# Patient Record
Sex: Female | Born: 1940 | Race: White | Hispanic: No | Marital: Married | Smoking: Never smoker
Health system: Southern US, Community
[De-identification: ages and names within clinical notes are randomized; demographics above are authoritative.]

## PROBLEM LIST (undated history)

## (undated) DIAGNOSIS — I10 Essential (primary) hypertension: Secondary | ICD-10-CM

## (undated) DIAGNOSIS — M1990 Unspecified osteoarthritis, unspecified site: Secondary | ICD-10-CM

## (undated) DIAGNOSIS — Z87442 Personal history of urinary calculi: Secondary | ICD-10-CM

## (undated) DIAGNOSIS — R319 Hematuria, unspecified: Secondary | ICD-10-CM

## (undated) DIAGNOSIS — I219 Acute myocardial infarction, unspecified: Secondary | ICD-10-CM

## (undated) DIAGNOSIS — R600 Localized edema: Secondary | ICD-10-CM

## (undated) DIAGNOSIS — E119 Type 2 diabetes mellitus without complications: Secondary | ICD-10-CM

## (undated) DIAGNOSIS — I4891 Unspecified atrial fibrillation: Secondary | ICD-10-CM

## (undated) DIAGNOSIS — D649 Anemia, unspecified: Secondary | ICD-10-CM

## (undated) DIAGNOSIS — Z8639 Personal history of other endocrine, nutritional and metabolic disease: Secondary | ICD-10-CM

## (undated) DIAGNOSIS — J189 Pneumonia, unspecified organism: Secondary | ICD-10-CM

## (undated) DIAGNOSIS — C801 Malignant (primary) neoplasm, unspecified: Secondary | ICD-10-CM

## (undated) DIAGNOSIS — G5601 Carpal tunnel syndrome, right upper limb: Secondary | ICD-10-CM

## (undated) DIAGNOSIS — K219 Gastro-esophageal reflux disease without esophagitis: Secondary | ICD-10-CM

## (undated) DIAGNOSIS — N189 Chronic kidney disease, unspecified: Secondary | ICD-10-CM

## (undated) DIAGNOSIS — R0600 Dyspnea, unspecified: Secondary | ICD-10-CM

## (undated) DIAGNOSIS — N2889 Other specified disorders of kidney and ureter: Secondary | ICD-10-CM

## (undated) HISTORY — DX: Anemia, unspecified: D64.9

## (undated) HISTORY — DX: Unspecified osteoarthritis, unspecified site: M19.90

## (undated) HISTORY — PX: ABDOMINAL HYSTERECTOMY: SHX81

## (undated) HISTORY — PX: TONSILLECTOMY: SUR1361

## (undated) HISTORY — DX: Essential (primary) hypertension: I10

## (undated) HISTORY — PX: CARPAL TUNNEL RELEASE: SHX101

## (undated) HISTORY — PX: HAMMER TOE SURGERY: SHX385

## (undated) HISTORY — PX: CHOLECYSTECTOMY: SHX55

## (undated) HISTORY — DX: Type 2 diabetes mellitus without complications: E11.9

## (undated) HISTORY — PX: AORTIC VALVE SURGERY: SHX549

## (undated) HISTORY — PX: BILATERAL CARPAL TUNNEL RELEASE: SHX6508

---

## 1990-12-18 HISTORY — PX: ABDOMINAL HYSTERECTOMY: SHX81

## 1994-12-18 HISTORY — PX: CHOLECYSTECTOMY: SHX55

## 1998-06-01 ENCOUNTER — Ambulatory Visit (HOSPITAL_COMMUNITY): Admission: RE | Admit: 1998-06-01 | Discharge: 1998-06-01 | Payer: Self-pay | Admitting: Family Medicine

## 2003-02-16 ENCOUNTER — Encounter: Payer: Self-pay | Admitting: Family Medicine

## 2003-02-16 ENCOUNTER — Encounter: Admission: RE | Admit: 2003-02-16 | Discharge: 2003-02-16 | Payer: Self-pay | Admitting: Family Medicine

## 2012-01-11 ENCOUNTER — Ambulatory Visit (INDEPENDENT_AMBULATORY_CARE_PROVIDER_SITE_OTHER): Payer: Medicare Other

## 2012-01-11 DIAGNOSIS — E669 Obesity, unspecified: Secondary | ICD-10-CM

## 2012-01-11 DIAGNOSIS — I10 Essential (primary) hypertension: Secondary | ICD-10-CM

## 2012-01-11 DIAGNOSIS — E785 Hyperlipidemia, unspecified: Secondary | ICD-10-CM

## 2012-05-10 ENCOUNTER — Ambulatory Visit: Payer: Medicare Other

## 2012-05-10 ENCOUNTER — Ambulatory Visit (INDEPENDENT_AMBULATORY_CARE_PROVIDER_SITE_OTHER): Payer: Medicare Other | Admitting: Family Medicine

## 2012-05-10 VITALS — BP 140/82 | HR 79 | Temp 97.6°F | Resp 16 | Ht 66.75 in | Wt 256.0 lb

## 2012-05-10 DIAGNOSIS — B079 Viral wart, unspecified: Secondary | ICD-10-CM

## 2012-05-10 DIAGNOSIS — M659 Synovitis and tenosynovitis, unspecified: Secondary | ICD-10-CM

## 2012-05-10 DIAGNOSIS — M7750 Other enthesopathy of unspecified foot: Secondary | ICD-10-CM

## 2012-05-10 DIAGNOSIS — M25579 Pain in unspecified ankle and joints of unspecified foot: Secondary | ICD-10-CM

## 2012-05-10 DIAGNOSIS — M79609 Pain in unspecified limb: Secondary | ICD-10-CM

## 2012-05-10 DIAGNOSIS — I10 Essential (primary) hypertension: Secondary | ICD-10-CM

## 2012-05-10 MED ORDER — OXAPROZIN 600 MG PO TABS: ORAL_TABLET | ORAL | Status: AC

## 2012-05-10 MED ORDER — LOSARTAN POTASSIUM-HCTZ 100-25 MG PO TABS: 1.0000 | ORAL_TABLET | Freq: Every day | ORAL | Status: DC

## 2012-05-10 NOTE — Progress Notes (Signed)
Subjective: Since January the patient has been having problems with pain in the medial aspect of her right ankle. She knows of no specific injury. She does care for a couple of grandchildren who she has to run after. She is awake. She has then gradually started developing more pain in the lateral and posterior thigh of the same length. It hurt the worse actually hurt up in the knee  Area.  Has a skin lesion her finger she wants me to check   Followup on blood pressure. Takes medicine regularly. No problems.  Objective No edema. A dermatofibroma-like lesion is apparent above the left and right medial malleolus. She has tenderness anterior and below the malleolus. No tenderness of the calcaneus indicative of a plantar fasciitis. The ankle does hurt on the version. No no pain to palpation of the lower thigh and posterior lateral calf, but that's area that hurt. Chest clear. Heart regular.  Assessment ankle pain Secondary strain and pain of right lateral thigh and calf muscles. Hypertension Wart  Plan: X-ray Shaved off the top of the warty lesions, and indeed it looks like a wart. We'll freeze it.  [facility] reading (PRIMARY) by  Dr. Alwyn Ren Mild arthritic changes, no fx.Loura Back treatment Was treated with cryosurgery freestyle refreeze x2. Patient tolerated the procedure well  Refill bp med

## 2012-05-10 NOTE — Patient Instructions (Signed)
Warts Warts are a common viral infection. They are most commonly caused by the human papillomavirus (HPV). Warts can occur at all ages. However, they occur most frequently in older children and infrequently in the elderly. Warts may be single or multiple. Location and size varies. Warts can be spread by scratching the wart and then scratching normal skin. The life cycle of warts varies. However, most will disappear over many months to a couple years. Warts commonly do not cause problems (asymptomatic) unless they are over an area of pressure, such as the bottom of the foot. If they are large enough, they may cause pain with walking. DIAGNOSIS  Warts are most commonly diagnosed by their appearance. Tissue samples (biopsies) are not required unless the wart looks abnormal. Most warts have a rough surface, are round, oval, or irregular, and are skin-colored to light yellow, brown, or gray. They are generally less than  inch (1.3 cm), but they can be any size. TREATMENT   Observation or no treatment.   Freezing with liquid nitrogen.   High heat (cautery).   Boosting the body's immunity to fight off the wart (immunotherapy using Candida antigen).   Laser surgery.   Application of various irritants and solutions.  HOME CARE INSTRUCTIONS  Follow your caregiver's instructions. No special precautions are necessary. Often, treatment may be followed by a return (recurrence) of warts. Warts are generally difficult to treat and get rid of. If treatment is done in a clinic setting, usually more than 1 treatment is required. This is usually done on only a monthly basis until the wart is completely gone. SEEK IMMEDIATE MEDICAL CARE IF: The treated skin becomes red, puffy (swollen), or painful. Document Released: 09/13/2005 Document Revised: 11/23/2011 Document Reviewed: 03/11/2010 [facility] Patient Information 2012 [place], [place].   If foot continues to hurt after 2-3 weeks we can refer you to  orthopedics

## 2012-12-05 ENCOUNTER — Other Ambulatory Visit: Payer: Self-pay | Admitting: Family Medicine

## 2013-02-06 ENCOUNTER — Ambulatory Visit (INDEPENDENT_AMBULATORY_CARE_PROVIDER_SITE_OTHER): Payer: Medicare Other | Admitting: Family Medicine

## 2013-02-06 VITALS — BP 143/75 | HR 79 | Temp 97.8°F | Resp 16 | Ht 66.5 in | Wt 263.2 lb

## 2013-02-06 DIAGNOSIS — R7309 Other abnormal glucose: Secondary | ICD-10-CM

## 2013-02-06 DIAGNOSIS — R739 Hyperglycemia, unspecified: Secondary | ICD-10-CM

## 2013-02-06 DIAGNOSIS — I10 Essential (primary) hypertension: Secondary | ICD-10-CM

## 2013-02-06 LAB — LIPID PANEL
Cholesterol: 206 mg/dL — ABNORMAL HIGH (ref 0–200)
HDL: 56 mg/dL (ref >39)
LDL Cholesterol: 133 mg/dL — ABNORMAL HIGH (ref 0–99)
Total CHOL/HDL Ratio: 3.7 Ratio
Triglycerides: 86 mg/dL (ref <150)
VLDL: 17 mg/dL (ref 0–40)

## 2013-02-06 LAB — COMPREHENSIVE METABOLIC PANEL
ALT: 14 U/L (ref 0–35)
AST: 13 U/L (ref 0–37)
Albumin: 3.9 g/dL (ref 3.5–5.2)
Alkaline Phosphatase: 87 U/L (ref 39–117)
BUN: 19 mg/dL (ref 6–23)
CO2: 26 mEq/L (ref 19–32)
Calcium: 9.4 mg/dL (ref 8.4–10.5)
Chloride: 103 mEq/L (ref 96–112)
Creat: 0.77 mg/dL (ref 0.50–1.10)
Glucose, Bld: 177 mg/dL — ABNORMAL HIGH (ref 70–99)
Potassium: 4.3 mEq/L (ref 3.5–5.3)
Sodium: 141 mEq/L (ref 135–145)
Total Bilirubin: 0.5 mg/dL (ref 0.3–1.2)
Total Protein: 6.6 g/dL (ref 6.0–8.3)

## 2013-02-06 LAB — POCT GLYCOSYLATED HEMOGLOBIN (HGB A1C): Hemoglobin A1C: 6.4

## 2013-02-06 MED ORDER — LOSARTAN POTASSIUM-HCTZ 100-25 MG PO TABS: 1.0000 | ORAL_TABLET | Freq: Every day | ORAL | Status: DC

## 2013-02-06 NOTE — Patient Instructions (Addendum)
Continue same medications  Advise seeing your eye doctor very soon  Return in about June or July  Get more regular exercise

## 2013-02-06 NOTE — Progress Notes (Signed)
Subjective: 72 year old lady who's here for a followup check on her blood pressure and refill of her medications. She has no major acute complaints. She has been noticing an abnormal flash of pain in the left eye when she would look left and would ache afterwards.  It is been a while since she seen her eye doctor. She does not get any regular exercise, except for caring for 2 grandchildren. She generally takes her medicines faithfully. Review of systems otherwise normal.  Objective: Overweight white female in no acute distress. TMs are normal except for a little wax in both ear canals. Her eyes PERRLA. Fundi appear benign. I do not see the cause of her discomfort. Throat was clear. Neck supple without nodes or thyromegaly. Chest clear. Heart regular without murmurs. No bruits. No ankle edema this morning.  Assessment: Hypertension Weight gain  Plan: Urged her to work harder try to get more exercise and to eat less.

## 2013-02-08 ENCOUNTER — Encounter: Payer: Self-pay | Admitting: Family Medicine

## 2013-05-13 ENCOUNTER — Telehealth: Payer: Self-pay | Admitting: Radiology

## 2013-05-13 NOTE — Telephone Encounter (Signed)
Letter Rc'd from patient about her office visit. Advised her Dr Alwyn Ren does want to see her to discuss with her. She is due to follow up in june

## 2013-08-05 ENCOUNTER — Other Ambulatory Visit: Payer: Self-pay | Admitting: Family Medicine

## 2013-09-02 ENCOUNTER — Other Ambulatory Visit: Payer: Self-pay | Admitting: Family Medicine

## 2013-09-02 ENCOUNTER — Ambulatory Visit (INDEPENDENT_AMBULATORY_CARE_PROVIDER_SITE_OTHER): Payer: Medicare Other | Admitting: Family Medicine

## 2013-09-02 VITALS — BP 134/64 | HR 88 | Temp 97.9°F | Resp 18 | Ht 67.0 in | Wt 255.0 lb

## 2013-09-02 DIAGNOSIS — E119 Type 2 diabetes mellitus without complications: Secondary | ICD-10-CM

## 2013-09-02 DIAGNOSIS — I10 Essential (primary) hypertension: Secondary | ICD-10-CM

## 2013-09-02 DIAGNOSIS — Z23 Encounter for immunization: Secondary | ICD-10-CM

## 2013-09-02 DIAGNOSIS — R7309 Other abnormal glucose: Secondary | ICD-10-CM

## 2013-09-02 LAB — POCT GLYCOSYLATED HEMOGLOBIN (HGB A1C): Hemoglobin A1C: 6.1

## 2013-09-02 LAB — GLUCOSE, POCT (MANUAL RESULT ENTRY): POC Glucose: 159 mg/dl — AB (ref 70–99)

## 2013-09-02 MED ORDER — METFORMIN HCL 500 MG PO TABS: ORAL_TABLET | ORAL | Status: DC

## 2013-09-02 MED ORDER — LOSARTAN POTASSIUM-HCTZ 100-25 MG PO TABS: 1.0000 | ORAL_TABLET | Freq: Every day | ORAL | Status: DC

## 2013-09-02 NOTE — Progress Notes (Signed)
Subjective: 72 year old lady who is here for her regular visit. When we get her labs back in the spring her glucose was high, and she was asked to be sure to come and get it checked. She was told she was diabetic and about 15 years ago and then a few years later was told she was not. She has put on some weight in recent years. She gets exercise chasing 3 little grandchildren she is raising, one is 86 years old and to 70 years old. Otherwise she is doing well with no other major complaints. Her regular medication is her blood pressure pill, and she's not taking any regular OTC medications.  Objective: Pleasant lady in no acute distress. Fundi appear benign. She saw her eye doctor back in March. Chest clear. Heart regular without murmurs.  Assessment: Hypertension Hyperglycemia  Plan: Hemoglobin A1c and fasting blood sugar today  Results for orders placed in visit on 09/02/13  POCT GLYCOSYLATED HEMOGLOBIN (HGB A1C)      Result Value Range   Hemoglobin A1C 6.1    GLUCOSE, POCT (MANUAL RESULT ENTRY)      Result Value Range   POC Glucose 159 (*) 70 - 99 mg/dl

## 2013-09-02 NOTE — Patient Instructions (Addendum)
Influenza Vaccine (Flu Vaccine, Inactivated) 2013 2014 What You Need to Know WHY GET VACCINATED?  Influenza ("flu") is a contagious disease that spreads around the [place] every winter, usually between October and May.  Flu is caused by the influenza virus, and can be spread by coughing, sneezing, and close contact.  Anyone can get flu, but the risk of getting flu is highest among children. Symptoms come on suddenly and may last several days. They can include:  Fever or chills.  Sore throat.  Muscle aches.  Fatigue.  Cough.  Headache.  Runny or stuffy nose. Flu can make some people much sicker than others. These people include young children, people 65 and older, pregnant women, and people with certain health conditions such as heart, lung or kidney disease, or a weakened immune system. Flu vaccine is especially important for these people, and anyone in close contact with them. Flu can also lead to pneumonia, and make existing medical conditions worse. It can cause diarrhea and seizures in children. Each year thousands of people in the [place] die from flu, and many more are hospitalized. Flu vaccine is the best protection we have from flu and its complications. Flu vaccine also helps prevent spreading flu from person to person. INACTIVATED FLU VACCINE There are 2 types of influenza vaccine:  You are getting an inactivated flu vaccine, which does not contain any live influenza virus. It is given by injection with a needle, and often called the "flu shot."  A different live, attenuated (weakened) influenza vaccine is sprayed into the nostrils. This vaccine is described in a separate Vaccine Information Statement. Flu vaccine is recommended every year. Children 6 months through 8 years of age should get 2 doses the first year they get vaccinated. Flu viruses are always changing. Each year's flu vaccine is made to protect from viruses that are most likely to cause disease  that year. While flu vaccine cannot prevent all cases of flu, it is our best defense against the disease. Inactivated flu vaccine protects against 3 or 4 different influenza viruses. It takes about 2 weeks for protection to develop after the vaccination, and protection lasts several months to a year. Some illnesses that are not caused by influenza virus are often mistaken for flu. Flu vaccine will not prevent these illnesses. It can only prevent influenza. A "high-dose" flu vaccine is available for people 65 years of age and older. The person giving you the vaccine can tell you more about it. Some inactivated flu vaccine contains a very small amount of a mercury-based preservative called thimerosal. Studies have shown that thimerosal in vaccines is not harmful, but flu vaccines that do not contain a preservative are available. SOME PEOPLE SHOULD NOT GET THIS VACCINE Tell the person who gives you the vaccine:  If you have any severe (life-threatening) allergies. If you ever had a life-threatening allergic reaction after a dose of flu vaccine, or have a severe allergy to any part of this vaccine, you may be advised not to get a dose. Most, but not all, types of flu vaccine contain a small amount of egg.  If you ever had Guillain Barr Syndrome (a severe paralyzing illness, also called GBS). Some people with a history of GBS should not get this vaccine. This should be discussed with your doctor.  If you are not feeling well. They might suggest waiting until you feel better. But you should come back. RISKS OF A VACCINE REACTION With a vaccine, like any medicine, there   is a chance of side effects. These are usually mild and go away on their own. Serious side effects are also possible, but are very rare. Inactivated flu vaccine does not contain live flu virus, sogetting flu from this vaccine is not possible. Brief fainting spells and related symptoms (such as jerking movements) can happen after any medical  procedure, including vaccination. Sitting or lying down for about 15 minutes after a vaccination can help prevent fainting and injuries caused by falls. Tell your doctor if you feel dizzy or lightheaded, or have vision changes or ringing in the ears. Mild problems following inactivated flu vaccine:  Soreness, redness, or swelling where the shot was given.  Hoarseness; sore, red or itchy eyes; or cough.  Fever.  Aches.  Headache.  Itching.  Fatigue. If these problems occur, they usually begin soon after the shot and last 1 or 2 days. Moderate problems following inactivated flu vaccine:  Young children who get inactivated flu vaccine and pneumococcal vaccine (PCV13) at the same time may be at increased risk for seizures caused by fever. Ask your doctor for more information. Tell your doctor if a child who is getting flu vaccine has ever had a seizure. Severe problems following inactivated flu vaccine:  A severe allergic reaction could occur after any vaccine (estimated less than 1 in a million doses).  There is a small possibility that inactivated flu vaccine could be associated with Guillan Barr Syndrome (GBS), no more than 1 or 2 cases per million people vaccinated. This is much lower than the risk of severe complications from flu, which can be prevented by flu vaccine. The safety of vaccines is always being monitored. For more information, visit: www.cdc.gov/vaccinesafety/ WHAT IF THERE IS A SERIOUS REACTION? What should I look for?  Look for anything that concerns you, such as signs of a severe allergic reaction, very high fever, or behavior changes. Signs of a severe allergic reaction can include hives, swelling of the face and throat, difficulty breathing, a fast heartbeat, dizziness, and weakness. These would start a few minutes to a few hours after the vaccination. What should I do?  If you think it is a severe allergic reaction or other emergency that cannot wait, call 9 1 1  or get the person to the nearest hospital. Otherwise, call your doctor.  Afterward, the reaction should be reported to the Vaccine Adverse Event Reporting System (VAERS). Your doctor might file this report, or you can do it yourself through the VAERS website at www.vaers.hhs.gov, or by calling 1-800-822-7967. VAERS is only for reporting reactions. They do not give medical advice. THE NATIONAL VACCINE INJURY COMPENSATION PROGRAM The National Vaccine Injury Compensation Program (VICP) is a federal program that was created to compensate people who may have been injured by certain vaccines. Persons who believe they may have been injured by a vaccine can learn about the program and about filing a claim by calling 1-800-338-2382 or visiting the VICP website at www.hrsa.gov/vaccinecompensation HOW CAN I LEARN MORE?  Ask your doctor.  Call your local or state health department.  Contact the Centers for Disease Control and Prevention (CDC):  Call 1-800-232-4636 (1-800-CDC-INFO) or  Visit CDC's website at www.cdc.gov/flu CDC Inactivated Influenza Vaccine Interim VIS (07/12/12) Document Released: 09/28/2006 Document Revised: 08/28/2012 Document Reviewed: 07/12/2012 [employer] Patient Information 2014 [employer].  

## 2013-09-29 ENCOUNTER — Other Ambulatory Visit: Payer: Self-pay | Admitting: Family Medicine

## 2013-10-27 ENCOUNTER — Other Ambulatory Visit: Payer: Self-pay

## 2013-10-27 MED ORDER — LOSARTAN POTASSIUM-HCTZ 100-25 MG PO TABS: 1.0000 | ORAL_TABLET | Freq: Every day | ORAL | Status: DC

## 2013-11-21 ENCOUNTER — Other Ambulatory Visit: Payer: Self-pay | Admitting: Family Medicine

## 2014-05-01 ENCOUNTER — Ambulatory Visit (INDEPENDENT_AMBULATORY_CARE_PROVIDER_SITE_OTHER): Payer: Medicare Other | Admitting: Family Medicine

## 2014-05-01 ENCOUNTER — Encounter: Payer: Self-pay | Admitting: Family Medicine

## 2014-05-01 VITALS — BP 140/70 | HR 70 | Temp 98.2°F | Resp 16 | Ht 65.0 in | Wt 255.0 lb

## 2014-05-01 DIAGNOSIS — E119 Type 2 diabetes mellitus without complications: Secondary | ICD-10-CM

## 2014-05-01 DIAGNOSIS — I10 Essential (primary) hypertension: Secondary | ICD-10-CM

## 2014-05-01 DIAGNOSIS — Z79899 Other long term (current) drug therapy: Secondary | ICD-10-CM

## 2014-05-01 LAB — COMPREHENSIVE METABOLIC PANEL
ALT: 18 U/L (ref 0–35)
AST: 18 U/L (ref 0–37)
Albumin: 4.3 g/dL (ref 3.5–5.2)
Alkaline Phosphatase: 92 U/L (ref 39–117)
BUN: 12 mg/dL (ref 6–23)
CO2: 28 mEq/L (ref 19–32)
Calcium: 9.4 mg/dL (ref 8.4–10.5)
Chloride: 101 mEq/L (ref 96–112)
Creat: 0.78 mg/dL (ref 0.50–1.10)
Glucose, Bld: 121 mg/dL — ABNORMAL HIGH (ref 70–99)
Potassium: 3.8 mEq/L (ref 3.5–5.3)
Sodium: 140 mEq/L (ref 135–145)
Total Bilirubin: 0.6 mg/dL (ref 0.2–1.2)
Total Protein: 7 g/dL (ref 6.0–8.3)

## 2014-05-01 LAB — LIPID PANEL
Cholesterol: 198 mg/dL (ref 0–200)
HDL: 44 mg/dL (ref >39)
LDL Cholesterol: 129 mg/dL — ABNORMAL HIGH (ref 0–99)
Total CHOL/HDL Ratio: 4.5 Ratio
Triglycerides: 124 mg/dL (ref <150)
VLDL: 25 mg/dL (ref 0–40)

## 2014-05-01 LAB — CBC
HCT: 36.2 % (ref 36.0–46.0)
Hemoglobin: 12.7 g/dL (ref 12.0–15.0)
MCH: 28.9 pg (ref 26.0–34.0)
MCHC: 35.1 g/dL (ref 30.0–36.0)
MCV: 82.5 fL (ref 78.0–100.0)
Platelets: 299 10*3/uL (ref 150–400)
RBC: 4.39 MIL/uL (ref 3.87–5.11)
RDW: 14.8 % (ref 11.5–15.5)
WBC: 9.6 10*3/uL (ref 4.0–10.5)

## 2014-05-01 LAB — TSH: TSH: 1.361 u[IU]/mL (ref 0.350–4.500)

## 2014-05-01 LAB — POCT GLYCOSYLATED HEMOGLOBIN (HGB A1C): Hemoglobin A1C: 6.2

## 2014-05-01 MED ORDER — LOSARTAN POTASSIUM-HCTZ 100-25 MG PO TABS: 1.0000 | ORAL_TABLET | Freq: Every day | ORAL | Status: DC

## 2014-05-01 NOTE — Progress Notes (Signed)
Subjective:    Patient ID: [name] [name], female    DOB: 1941/12/12, 73 y.o.   MRN: [id number] Chief Complaint  Patient presents with  . Medication Refill    BP Losartan-HCTZ 100-25 mg    HPI This chart was scribed for Shawnee Knapp, MD by Ladene Artist, ED Scribe. The patient was seen in room 24. Patient's care was started at 12:33 PM.  HPI Comments: [name] [name] is a 73 y.o. female who presents to the [facility] complaining of medication refill. Pt reports DM a while ago but pre-DM following a recheck. She reports going out for pizza the day before it was last checked. She does not report any complications with medication. Pt reports mild intermittent diarrhea but states that this is not unusual.   Pt has not eaten today with the exception of a throat lozenge that she had in the office.  Pt does not check BP outside of office.She reports eating a lot of salt in her diet and states that she does not exercise as much as she should. She states that she takes care of her 3 grandchildren. She has tried water aerobics but reports knee pain with this. She drinks hot water in the winter but states that she does not drink as much as she should. Pt does not drink tea or coffee.    Past Medical History  Diagnosis Date  . Arthritis   . Anemia   . Diabetes mellitus without complication   . Hypertension    Current Outpatient Prescriptions on File Prior to Visit  Medication Sig Dispense Refill  . losartan-hydrochlorothiazide (HYZAAR) 100-25 MG per tablet Take 1 tablet by mouth daily. Needs office visit  30 tablet  5  . losartan-hydrochlorothiazide (HYZAAR) 100-25 MG per tablet TAKE 1 TABLET BY MOUTH DAILY.  90 tablet  0  . metFORMIN (GLUCOPHAGE) 500 MG tablet Take one daily at suppertime for diabetes  180 tablet  3   No current facility-administered medications on file prior to visit.   No Known Allergies  Review of Systems  Constitutional: Negative for activity change.    Gastrointestinal: Positive for diarrhea (mild, intermittent).       Objective:  BP 140/70  Pulse 70  Temp(Src) 98.2 F (36.8 C) (Oral)  Resp 16  Ht 5\' 5"  (1.651 m)  Wt 255 lb (115.667 kg)  BMI 42.43 kg/m2  SpO2 97%  Physical Exam  Nursing note and vitals reviewed. Constitutional: She appears well-developed and well-nourished. No distress.  HENT:  Head: Normocephalic and atraumatic.  Neck: Neck supple.  Cardiovascular: Normal rate, regular rhythm and normal heart sounds.   Pulmonary/Chest: Effort normal.  Clear lungs  Lymphadenopathy:    She has no cervical adenopathy.  Neurological: She is alert.  Skin: She is not diaphoretic.      Assessment & Plan:  Will refill metformin after hemoglobin A1C is evaluated.   Type II or unspecified type diabetes mellitus without mention of complication, not stated as uncontrolled - Plan: [facility] Diabetes Foot Exam, POCT glycosylated hemoglobin (Hb A1C)  Hypertension - Plan: CBC, TSH  Encounter for long-term (current) use of other medications - Plan: Comprehensive metabolic panel  Severe obesity (BMI >= 40) - Plan: Lipid panel, CBC, TSH  HTN (hypertension) - Plan: losartan-hydrochlorothiazide (HYZAAR) 100-25 MG per tablet  Meds ordered this encounter  Medications  . aspirin 81 MG tablet    Sig: Take 81 mg by mouth daily.  [name] losartan-hydrochlorothiazide (HYZAAR) 100-25 MG  per tablet    Sig: Take 1 tablet by mouth daily. Needs office visit    Dispense:  90 tablet    Refill:  3    I personally performed the services described in this documentation, which was scribed in my presence. The recorded information has been reviewed and considered, and addended by me as needed.  Delman Cheadle, MD MPH   Results for orders placed in visit on 05/01/14  COMPREHENSIVE METABOLIC PANEL      Result Value Ref Range   Sodium 140  135 - 145 mEq/L   Potassium 3.8  3.5 - 5.3 mEq/L   Chloride 101  96 - 112 mEq/L   CO2 28  19 - 32 mEq/L   Glucose, Bld 121  (*) 70 - 99 mg/dL   BUN 12  6 - 23 mg/dL   Creat 0.78  0.50 - 1.10 mg/dL   Total Bilirubin 0.6  0.2 - 1.2 mg/dL   Alkaline Phosphatase 92  39 - 117 U/L   AST 18  0 - 37 U/L   ALT 18  0 - 35 U/L   Total Protein 7.0  6.0 - 8.3 g/dL   Albumin 4.3  3.5 - 5.2 g/dL   Calcium 9.4  8.4 - 10.5 mg/dL  LIPID PANEL      Result Value Ref Range   Cholesterol 198  0 - 200 mg/dL   Triglycerides 124  <150 mg/dL   HDL 44  >39 mg/dL   Total CHOL/HDL Ratio 4.5     VLDL 25  0 - 40 mg/dL   LDL Cholesterol 129 (*) 0 - 99 mg/dL  CBC      Result Value Ref Range   WBC 9.6  4.0 - 10.5 K/uL   RBC 4.39  3.87 - 5.11 MIL/uL   Hemoglobin 12.7  12.0 - 15.0 g/dL   HCT 36.2  36.0 - 46.0 %   MCV 82.5  78.0 - 100.0 fL   MCH 28.9  26.0 - 34.0 pg   MCHC 35.1  30.0 - 36.0 g/dL   RDW 14.8  11.5 - 15.5 %   Platelets 299  150 - 400 K/uL  TSH      Result Value Ref Range   TSH 1.361  0.350 - 4.500 uIU/mL  POCT GLYCOSYLATED HEMOGLOBIN (HGB A1C)      Result Value Ref Range   Hemoglobin A1C 6.2

## 2014-09-01 ENCOUNTER — Telehealth: Payer: Self-pay | Admitting: *Deleted

## 2014-09-01 NOTE — Telephone Encounter (Signed)
Attempted to contact patient to schedule Annual Medicare Wellness Exam & discuss mammogram and colorectal ca screening & diabetic eye exam.  No answer.  Will compose & send letter.

## 2014-09-16 ENCOUNTER — Telehealth: Payer: Self-pay | Admitting: *Deleted

## 2014-09-16 NOTE — Telephone Encounter (Signed)
Phoned patient to schedule AMWE & discuss mammo & colo and clarifying PCP.  Pt states her PCP is Ruben Reason, she always sees him in the walk-in clinic & that when she's ready to see him, she calls the clinic to find out when he is going to be there & that is what she prefers to do.  In regards to the mammogram and colorectal ca screening, she refused.  Also, patient stated she had to see a different provider in May when Dr. Linna Darner was out of the [place] and that she has been awaiting for either a phone call or a letter regarding those labs (05/01/14).  Replied that I would pass this info on to MD/his nurse.

## 2014-09-18 ENCOUNTER — Encounter: Payer: Self-pay | Admitting: *Deleted

## 2014-09-18 NOTE — Telephone Encounter (Signed)
Composed & mailed letter with MD comments.

## 2014-09-18 NOTE — Telephone Encounter (Signed)
Call patient  I reviewed the labs and they look okay, should have been visible in My Chart I think.  Dr. Brigitte Pulse ordered them.    We cannot make her do a mammogram and colorectal screen if she chooses not to.

## 2014-10-23 ENCOUNTER — Other Ambulatory Visit: Payer: Self-pay | Admitting: Family Medicine

## 2015-01-23 ENCOUNTER — Other Ambulatory Visit: Payer: Self-pay | Admitting: Family Medicine

## 2015-03-22 ENCOUNTER — Encounter: Payer: Self-pay | Admitting: *Deleted

## 2015-03-30 ENCOUNTER — Telehealth: Payer: Self-pay | Admitting: *Deleted

## 2015-03-30 NOTE — Telephone Encounter (Signed)
Received letter from patient (she returned the letter I had sent to her regarding scheduling wellness visit), stating she didn't know why I was contacting her that her PCP was Dr. Linna Darner (no further name clarification) and that she would not be sending me her eye exam report.  After speaking with Dr. Reginia Forts, she advised me to call patient & clarify that I was calling on behalf of Dr. Linna Darner and attempt to clarify misunderstanding/miscommunication with pt.  No answer, Williamson and stated that due to the already misunderstanding, verbal communication via phone may be the most effective way to resolve issue.

## 2015-04-29 ENCOUNTER — Ambulatory Visit (INDEPENDENT_AMBULATORY_CARE_PROVIDER_SITE_OTHER): Payer: Medicare Other | Admitting: Family Medicine

## 2015-04-29 VITALS — BP 132/70 | HR 82 | Temp 97.5°F | Resp 18 | Ht 67.0 in | Wt 265.0 lb

## 2015-04-29 DIAGNOSIS — E785 Hyperlipidemia, unspecified: Secondary | ICD-10-CM

## 2015-04-29 DIAGNOSIS — E663 Overweight: Secondary | ICD-10-CM | POA: Diagnosis not present

## 2015-04-29 DIAGNOSIS — I10 Essential (primary) hypertension: Secondary | ICD-10-CM | POA: Diagnosis not present

## 2015-04-29 DIAGNOSIS — E119 Type 2 diabetes mellitus without complications: Secondary | ICD-10-CM | POA: Diagnosis not present

## 2015-04-29 LAB — COMPLETE METABOLIC PANEL WITH GFR
ALT: 18 U/L (ref 0–35)
AST: 14 U/L (ref 0–37)
Albumin: 3.7 g/dL (ref 3.5–5.2)
Alkaline Phosphatase: 81 U/L (ref 39–117)
BUN: 23 mg/dL (ref 6–23)
CO2: 19 mEq/L (ref 19–32)
Calcium: 9.3 mg/dL (ref 8.4–10.5)
Chloride: 106 mEq/L (ref 96–112)
Creat: 0.72 mg/dL (ref 0.50–1.10)
GFR, Est African American: >89 mL/min
GFR, Est Non African American: 85 mL/min
Glucose, Bld: 152 mg/dL — ABNORMAL HIGH (ref 70–99)
Potassium: 4.2 mEq/L (ref 3.5–5.3)
Sodium: 140 mEq/L (ref 135–145)
Total Bilirubin: 0.4 mg/dL (ref 0.2–1.2)
Total Protein: 6.5 g/dL (ref 6.0–8.3)

## 2015-04-29 LAB — POCT GLYCOSYLATED HEMOGLOBIN (HGB A1C): Hemoglobin A1C: 7.1

## 2015-04-29 LAB — LIPID PANEL
Cholesterol: 191 mg/dL (ref 0–200)
HDL: 58 mg/dL (ref >=46)
LDL Cholesterol: 113 mg/dL — ABNORMAL HIGH (ref 0–99)
Total CHOL/HDL Ratio: 3.3 Ratio
Triglycerides: 101 mg/dL (ref <150)
VLDL: 20 mg/dL (ref 0–40)

## 2015-04-29 LAB — GLUCOSE, POCT (MANUAL RESULT ENTRY): POC Glucose: 148 mg/dl — AB (ref 70–99)

## 2015-04-29 MED ORDER — LOSARTAN POTASSIUM-HCTZ 100-25 MG PO TABS: 1.0000 | ORAL_TABLET | Freq: Every day | ORAL | Status: DC

## 2015-04-29 MED ORDER — METFORMIN HCL 500 MG PO TABS: 500.0000 mg | ORAL_TABLET | Freq: Every day | ORAL | Status: DC

## 2015-04-29 MED ORDER — ATORVASTATIN CALCIUM 10 MG PO TABS: 10.0000 mg | ORAL_TABLET | Freq: Every day | ORAL | Status: DC

## 2015-04-29 NOTE — Patient Instructions (Addendum)
Restart metformin once per day.  Make sure to drink sufficient water and fiber in diet.  If diarrhea persists - return to discuss medication options.   If dizziness persists - return to discuss this further, but as above - make sure you are drinking sufficient fluid throughout the day.   If your cholesterol is still elevated - I would recommend starting Lipitor as discussed (this was sent to your pharmacy, but you can have them put that on hold for now).  If new side effects - let me know. If you do start the Lipitor - return in 6 weeks for labs only to make sure it is not affecting your liver.   Walking for exercise - 150 minutes per week.   Follow up in next 6 months with Dr. Linna Darner for physical and to recheck blood sugar.   You should receive a call or letter about your lab results within the next week to 10 days.   Diabetes and Standards of Medical Care Diabetes is complicated. You may find that your diabetes team includes a dietitian, nurse, diabetes educator, eye doctor, and more. To help everyone know what is going on and to help you get the care you deserve, the following schedule of care was developed to help keep you on track. Below are the tests, exams, vaccines, medicines, education, and plans you will need. HbA1c test This test shows how well you have controlled your glucose over the past 2-3 months. It is used to see if your diabetes management plan needs to be adjusted.   It is performed at least 2 times a year if you are meeting treatment goals.  It is performed 4 times a year if therapy has changed or if you are not meeting treatment goals. Blood pressure test  This test is performed at every routine medical visit. The goal is less than 140/90 mm Hg for most people, but 130/80 mm Hg in some cases. Ask your health care provider about your goal. Dental exam  Follow up with the dentist regularly. Eye exam  If you are diagnosed with type 1 diabetes as a child, get an exam  upon reaching the age of 55 years or older and have had diabetes for 3-5 years. Yearly eye exams are recommended after that initial eye exam.  If you are diagnosed with type 1 diabetes as an adult, get an exam within 5 years of diagnosis and then yearly.  If you are diagnosed with type 2 diabetes, get an exam as soon as possible after the diagnosis and then yearly. Foot care exam  Visual foot exams are performed at every routine medical visit. The exams check for cuts, injuries, or other problems with the feet.  A comprehensive foot exam should be done yearly. This includes visual inspection as well as assessing foot pulses and testing for loss of sensation.  Check your feet nightly for cuts, injuries, or other problems with your feet. Tell your health care provider if anything is not healing. Kidney function test (urine microalbumin)  This test is performed once a year.  Type 1 diabetes: The first test is performed 5 years after diagnosis.  Type 2 diabetes: The first test is performed at the time of diagnosis.  A serum creatinine and estimated glomerular filtration rate (eGFR) test is done once a year to assess the level of chronic kidney disease (CKD), if present. Lipid profile (cholesterol, HDL, LDL, triglycerides)  Performed every 5 years for most people.  The goal for  LDL is less than 100 mg/dL. If you are at high risk, the goal is less than 70 mg/dL.  The goal for HDL is 40 mg/dL-50 mg/dL for men and 50 mg/dL-60 mg/dL for women. An HDL cholesterol of 60 mg/dL or higher gives some protection against heart disease.  The goal for triglycerides is less than 150 mg/dL. Influenza vaccine, pneumococcal vaccine, and hepatitis B vaccine  The influenza vaccine is recommended yearly.  It is recommended that people with diabetes who are over 56 years old get the pneumonia vaccine. In some cases, two separate shots may be given. Ask your health care provider if your pneumonia vaccination  is up to date.  The hepatitis B vaccine is also recommended for adults with diabetes. Diabetes self-management education  Education is recommended at diagnosis and ongoing as needed. Treatment plan  Your treatment plan is reviewed at every medical visit. Document Released: 10/01/2009 Document Revised: 04/20/2014 Document Reviewed: 05/06/2013 [facility] Patient Information 2015 [place], [place]. This information is not intended to replace advice given to you by your health care provider. Make sure you discuss any questions you have with your health care provider.

## 2015-04-29 NOTE — Progress Notes (Addendum)
Subjective:  This chart was scribed for Meredith Staggers, MD by [facility], medical scribe at [facility].The patient was seen in exam room 09 and the patient's care was started at 8:47 AM.    Patient ID: [name] [name], female    DOB: 22-Aug-1941, 74 y.o.   MRN: [id number] Chief Complaint  Patient presents with  . Medication Refill    metformin, losartan   HPI HPI Comments: [name] [name] is a 74 y.o. female who presents to [facility] for a medication refill. Last seen by Dr. Clelia Croft in May 2015.  Diabetes:  She takes metformin 500 mg daily, which was last refilled Feb 8 th 2016. Pt has been spacing out her metformin, because she has running out. Some diarrhea from the metformin. Pt does not exercise aside from her day to day activity. Weight is up ten pounds from last year. She does not watch her diet and defers referral to a nutritionist. Lab Results  Component Value Date   HGBA1C 6.2 05/01/2014   Lab Results  Component Value Date   CHOL 198 05/01/2014   HDL 44 05/01/2014   LDLCALC 129* 05/01/2014   TRIG 124 05/01/2014   CHOLHDL 4.5 05/01/2014   Hypertension: Pt takes Hyzaar 100-25 mg daily. Last lab work one year ago. She does not check her blood pressure at home. Pt does take an 81 mg aspirin daily.  Lab Results  Component Value Date   CREATININE 0.78 05/01/2014   Dizziness: Pt does complain of intermittent dizziness for the past 2 weeks when she looks up. This has not occurred the past two day.  Patient Active Problem List   Diagnosis Date Noted  . Type II or unspecified type diabetes mellitus without mention of complication, not stated as uncontrolled 09/02/2013  . HTN (hypertension) 02/06/2013   Past Medical History  Diagnosis Date  . Arthritis   . Anemia   . Diabetes mellitus without complication   . Hypertension    Past Surgical History  Procedure Laterality Date  . Cholecystectomy    . Abdominal hysterectomy     No Known  Allergies Prior to Admission medications   Medication Sig Start Date End Date Taking? Authorizing Provider  aspirin 81 MG tablet Take 81 mg by mouth daily.   Yes Historical Provider, MD  losartan-hydrochlorothiazide (HYZAAR) 100-25 MG per tablet Take 1 tablet by mouth daily. Needs office visit 05/01/14  Yes Sherren Mocha, MD  metFORMIN (GLUCOPHAGE) 500 MG tablet Take 1 tablet (500 mg total) by mouth daily. NO MORE REFILLS WITHOUT OFFICE VISIT - 2ND NOTICE 01/25/15  Yes Chelle Tinnie Gens, PA-C  Multiple Vitamins-Minerals (ONE-A-DAY 50 PLUS PO) Take by mouth.   Yes Historical Provider, MD   History   Social History  . Marital Status: Married    Spouse Name: N/A  . Number of Children: N/A  . Years of Education: N/A   Occupational History  . Not on file.   Social History Main Topics  . Smoking status: Never Smoker   . Smokeless tobacco: Not on file  . Alcohol Use: Not on file  . Drug Use: Not on file  . Sexual Activity: Not Currently   Other Topics Concern  . Not on file   Social History Narrative   Review of Systems  Constitutional: Negative for fatigue and unexpected weight change.  Respiratory: Negative for chest tightness and shortness of breath.   Cardiovascular: Negative for chest pain, palpitations and  leg swelling.  Gastrointestinal: Negative for abdominal pain and blood in stool.  Neurological: Positive for dizziness. Negative for syncope, light-headedness and headaches.      Objective:  BP 132/70 mmHg  Pulse 82  Temp(Src) 97.5 F (36.4 C) (Oral)  Resp 18  Ht $R'5\' 7"'bp$  (1.702 m)  Wt 265 lb (120.203 kg)  BMI 41.50 kg/m2  SpO2 96% Physical Exam  Constitutional: She is oriented to person, place, and time. She appears well-developed and well-nourished. No distress.  Slightly dizzy after supine    HENT:  Head: Normocephalic and atraumatic.  2 beats of nystagmus horizontally.  Eyes: Conjunctivae and EOM are normal. Pupils are equal, round, and reactive to light.  Neck:  Normal range of motion. Carotid bruit is not present.  Cardiovascular: Normal rate, regular rhythm, normal heart sounds and intact distal pulses.   Pulmonary/Chest: Effort normal and breath sounds normal. No respiratory distress.  Abdominal: Soft. She exhibits no pulsatile midline mass. There is no tenderness.  Musculoskeletal: Normal range of motion.  1+ pedal edema bilaterally.  Neurological: She is alert and oriented to person, place, and time.  Skin: Skin is warm and dry.  Psychiatric: She has a normal mood and affect. Her behavior is normal.  Nursing note and vitals reviewed.  Results for orders placed or performed in visit on 04/29/15  POCT glucose (manual entry)  Result Value Ref Range   POC Glucose 148 (A) 70 - 99 mg/dl  POCT glycosylated hemoglobin (Hb A1C)  Result Value Ref Range   Hemoglobin A1C 7.1         Assessment & Plan:   [name] [name] is a 74 y.o. female Hyperlipidemia - Plan: Lipid panel, COMPLETE METABOLIC PANEL WITH GFR, atorvastatin (LIPITOR) 10 MG tablet, Hepatic Function Panel  - based on today's BP and prior lipids - 27% 10 year ASCVD risk.  Recommended statin based on risk.  Her concerns and questions were answered, and risks of statins discussed, but also risk of DM, HTN, and heart disease risk.   -Rx lipitor $RemoveBef'10mg'jFqcoLSXrX$  - can start if lipids elevated today, then LFT's in 6 weeks - lab only. Recheck lipids in 6 months.   Type 2 diabetes mellitus without complication - Plan: POCT glucose (manual entry), POCT glycosylated hemoglobin (Hb A1C), metFORMIN (GLUCOPHAGE) 500 MG tablet  -decreased control. Restart metformin $RemoveBeforeDEI'500mg'eSLoThPvisAQWWQv$  QD, diet and exercise discussed for wt loss.   -fiber and increase fluid in diet - if diarrhea persistent - rtc to discuss med options.   Essential hypertension - Plan: Lipid panel, COMPLETE METABOLIC PANEL WITH GFR, losartan-hydrochlorothiazide (HYZAAR) 100-25 MG per tablet  -controlled. No med changes. cmp pending.   -if dizziness persists -  return to discuss further.  Increase fluids. RTC/ER precautions.   Overweight  -diet/exercise as above.   Follow up with Dr. Linna Darner in 6 months. Handout on DM and standards of care given.   Meds ordered this encounter  Medications  . Multiple Vitamins-Minerals (ONE-A-DAY 50 PLUS PO)    Sig: Take by mouth.  . metFORMIN (GLUCOPHAGE) 500 MG tablet    Sig: Take 1 tablet (500 mg total) by mouth daily.    Dispense:  90 tablet    Refill:  1  . losartan-hydrochlorothiazide (HYZAAR) 100-25 MG per tablet    Sig: Take 1 tablet by mouth daily. Needs office visit    Dispense:  90 tablet    Refill:  1  . atorvastatin (LIPITOR) 10 MG tablet    Sig: Take 1 tablet (10  mg total) by mouth daily.    Dispense:  90 tablet    Refill:  1   Patient Instructions  Restart metformin once per day.  Make sure to drink sufficient water and fiber in diet.  If diarrhea persists - return to discuss medication options.   If dizziness persists - return to discuss this further, but as above - make sure you are drinking sufficient fluid throughout the day.   If your cholesterol is still elevated - I would recommend starting Lipitor as discussed (this was sent to your pharmacy, but you can have them put that on hold for now).  If new side effects - let me know. If you do start the Lipitor - return in 6 weeks for labs only to make sure it is not affecting your liver.   Walking for exercise - 150 minutes per week.   Follow up in next 6 months with Dr. Linna Darner for physical and to recheck blood sugar.   You should receive a call or letter about your lab results within the next week to 10 days.   Diabetes and Standards of Medical Care Diabetes is complicated. You may find that your diabetes team includes a dietitian, nurse, diabetes educator, eye doctor, and more. To help everyone know what is going on and to help you get the care you deserve, the following schedule of care was developed to help keep you on track. Below  are the tests, exams, vaccines, medicines, education, and plans you will need. HbA1c test This test shows how well you have controlled your glucose over the past 2-3 months. It is used to see if your diabetes management plan needs to be adjusted.   It is performed at least 2 times a year if you are meeting treatment goals.  It is performed 4 times a year if therapy has changed or if you are not meeting treatment goals. Blood pressure test  This test is performed at every routine medical visit. The goal is less than 140/90 mm Hg for most people, but 130/80 mm Hg in some cases. Ask your health care provider about your goal. Dental exam  Follow up with the dentist regularly. Eye exam  If you are diagnosed with type 1 diabetes as a child, get an exam upon reaching the age of 39 years or older and have had diabetes for 3-5 years. Yearly eye exams are recommended after that initial eye exam.  If you are diagnosed with type 1 diabetes as an adult, get an exam within 5 years of diagnosis and then yearly.  If you are diagnosed with type 2 diabetes, get an exam as soon as possible after the diagnosis and then yearly. Foot care exam  Visual foot exams are performed at every routine medical visit. The exams check for cuts, injuries, or other problems with the feet.  A comprehensive foot exam should be done yearly. This includes visual inspection as well as assessing foot pulses and testing for loss of sensation.  Check your feet nightly for cuts, injuries, or other problems with your feet. Tell your health care provider if anything is not healing. Kidney function test (urine microalbumin)  This test is performed once a year.  Type 1 diabetes: The first test is performed 5 years after diagnosis.  Type 2 diabetes: The first test is performed at the time of diagnosis.  A serum creatinine and estimated glomerular filtration rate (eGFR) test is done once a year to assess the level of chronic  kidney  disease (CKD), if present. Lipid profile (cholesterol, HDL, LDL, triglycerides)  Performed every 5 years for most people.  The goal for LDL is less than 100 mg/dL. If you are at high risk, the goal is less than 70 mg/dL.  The goal for HDL is 40 mg/dL-50 mg/dL for men and 50 mg/dL-60 mg/dL for women. An HDL cholesterol of 60 mg/dL or higher gives some protection against heart disease.  The goal for triglycerides is less than 150 mg/dL. Influenza vaccine, pneumococcal vaccine, and hepatitis B vaccine  The influenza vaccine is recommended yearly.  It is recommended that people with diabetes who are over 55 years old get the pneumonia vaccine. In some cases, two separate shots may be given. Ask your health care provider if your pneumonia vaccination is up to date.  The hepatitis B vaccine is also recommended for adults with diabetes. Diabetes self-management education  Education is recommended at diagnosis and ongoing as needed. Treatment plan  Your treatment plan is reviewed at every medical visit. Document Released: 10/01/2009 Document Revised: 04/20/2014 Document Reviewed: 05/06/2013 [facility] Patient Information 2015 [place], [place]. This information is not intended to replace advice given to you by your health care provider. Make sure you discuss any questions you have with your health care provider.       I personally performed the services described in this documentation, which was scribed in my presence. The recorded information has been reviewed and considered, and addended by me as needed.

## 2015-10-21 ENCOUNTER — Other Ambulatory Visit: Payer: Self-pay | Admitting: Family Medicine

## 2016-01-15 ENCOUNTER — Other Ambulatory Visit: Payer: Self-pay | Admitting: Family Medicine

## 2016-02-24 ENCOUNTER — Ambulatory Visit (INDEPENDENT_AMBULATORY_CARE_PROVIDER_SITE_OTHER): Payer: Medicare Other | Admitting: Family Medicine

## 2016-02-24 VITALS — BP 136/72 | HR 77 | Temp 97.3°F | Resp 16 | Ht 67.0 in | Wt 235.4 lb

## 2016-02-24 DIAGNOSIS — I10 Essential (primary) hypertension: Secondary | ICD-10-CM | POA: Diagnosis not present

## 2016-02-24 DIAGNOSIS — E785 Hyperlipidemia, unspecified: Secondary | ICD-10-CM | POA: Diagnosis not present

## 2016-02-24 DIAGNOSIS — Z1239 Encounter for other screening for malignant neoplasm of breast: Secondary | ICD-10-CM | POA: Diagnosis not present

## 2016-02-24 DIAGNOSIS — E119 Type 2 diabetes mellitus without complications: Secondary | ICD-10-CM | POA: Diagnosis not present

## 2016-02-24 LAB — POCT GLYCOSYLATED HEMOGLOBIN (HGB A1C): Hemoglobin A1C: 5.6

## 2016-02-24 LAB — COMPLETE METABOLIC PANEL WITH GFR
ALT: 17 U/L (ref 6–29)
AST: 20 U/L (ref 10–35)
Albumin: 4.2 g/dL (ref 3.6–5.1)
Alkaline Phosphatase: 75 U/L (ref 33–130)
BUN: 17 mg/dL (ref 7–25)
CO2: 27 mmol/L (ref 20–31)
Calcium: 9.9 mg/dL (ref 8.6–10.4)
Chloride: 101 mmol/L (ref 98–110)
Creat: 0.85 mg/dL (ref 0.60–0.93)
GFR, Est African American: 79 mL/min (ref >=60)
GFR, Est Non African American: 69 mL/min (ref >=60)
Glucose, Bld: 90 mg/dL (ref 65–99)
Potassium: 4.3 mmol/L (ref 3.5–5.3)
Sodium: 139 mmol/L (ref 135–146)
Total Bilirubin: 0.7 mg/dL (ref 0.2–1.2)
Total Protein: 7.2 g/dL (ref 6.1–8.1)

## 2016-02-24 LAB — GLUCOSE, POCT (MANUAL RESULT ENTRY): POC Glucose: 96 mg/dl (ref 70–99)

## 2016-02-24 LAB — LIPID PANEL
Cholesterol: 227 mg/dL — ABNORMAL HIGH (ref 125–200)
HDL: 68 mg/dL (ref >=46)
LDL Cholesterol: 140 mg/dL — ABNORMAL HIGH (ref <130)
Total CHOL/HDL Ratio: 3.3 Ratio (ref <=5.0)
Triglycerides: 95 mg/dL (ref <150)
VLDL: 19 mg/dL (ref <30)

## 2016-02-24 MED ORDER — LOSARTAN POTASSIUM-HCTZ 100-25 MG PO TABS: ORAL_TABLET | ORAL | Status: DC

## 2016-02-24 MED ORDER — ATORVASTATIN CALCIUM 10 MG PO TABS: 10.0000 mg | ORAL_TABLET | Freq: Every day | ORAL | Status: DC

## 2016-02-24 NOTE — Progress Notes (Signed)
Patient ID: [name] [name], female    DOB: 12-23-1940  Age: 75 y.o. MRN: [id number]  Chief Complaint  Patient presents with  . Medication Refill    losartan and metformin    Subjective:   Pleasant lady 75 years old who was last seen by one of the other doctors. She is diabetic, takes her medicines faithfully, does not check her sugars at home. She does watch what she eats but does not get a lot of regular exercise. She has succeeded in losing about 30 pounds in the last 3 years. 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 She denies any major health issues. No headaches or dizziness or chest pains or shortness of breath other than some exertional dyspnea. No GI or GU complaints complaints. It is been a while since she has seen an eye doctor and has been about 3 years since she had a mammogram. She had 3 first cousins with breast cancer, but no first-degree relatives.  Current allergies, medications, problem list, past/family and social histories reviewed.  Objective:  BP 136/72 mmHg  Pulse 77  Temp(Src) 97.3 F (36.3 C) (Oral)  Resp 16  Ht 5\' 7"  (1.702 m)  Wt 235 lb 6.4 oz (106.777 kg)  BMI 36.86 kg/m2  SpO2 97%  Pleasant lady in no acute distress. She is overweight. TMs normal. Throat clear. Neck supple without nodes or thyromegaly. Chest is clear to auscultation. Heart regular without any murmurs.  Assessment & Plan:   Assessment: No diagnosis found.    Plan: Do screening labs. Refill medications. Refer for mammography and recommend eye exam.  No orders of the defined types were placed in this encounter.    No orders of the defined types were placed in this encounter.    Results for orders placed or performed in visit on 02/24/16  POCT glycosylated hemoglobin (Hb A1C)  Result Value Ref Range   Hemoglobin A1C 5.6   POCT glucose (manual entry)  Result Value Ref Range   POC Glucose 96 70 - 99 mg/dl        There are no Patient Instructions on file for this  visit.   No Follow-up on file.   [name] Rickett, MD 02/24/2016

## 2016-02-24 NOTE — Patient Instructions (Addendum)
With your good hemoglobin A1c we will discontinue the metformin. I encourage you to continue losing weight. I still encourage you to trying get more regular exercise.  Advise returning in about 6 months to recheck things once again to make sure you're safe off of the metformin  Continue taking her other medications for your blood pressure and cholesterol.  I will let you know the results of your remaining labs over the next week. Return at anytime if further problems or call us if you should not hear from the labs for any reason  Because you received labwork today, you will receive an invoice from [employer]. Please contact [facility] at [phone] with questions or concerns regarding your invoice. Our billing staff will not be able to assist you with those questions.  You will be contacted with the lab results as soon as they are available. The fastest way to get your results is to activate your My Chart account. Instructions are located on the last page of this paperwork. If you have not heard from [place] regarding the results in 2 weeks, please contact this office.

## 2016-02-25 LAB — MICROALBUMIN, URINE: Microalb, Ur: 0.2 mg/dL

## 2016-03-01 ENCOUNTER — Telehealth: Payer: Self-pay | Admitting: Radiology

## 2016-03-01 NOTE — Telephone Encounter (Signed)
Patient has sent in a letter wanting Lipitor removed from her medication list, she has chosen not to use this medication, Dr Linna Darner has asked me to remove this from her medication list, so I have removed it.

## 2016-03-20 ENCOUNTER — Telehealth: Payer: Self-pay

## 2016-03-20 NOTE — Telephone Encounter (Signed)
Pt states she was to have a mammogram set up at [facility] but after they knew she was having itching/redness they Requested a diagnostic mammogram and have not heard back from [place]   Best phone is [phone]

## 2016-03-21 NOTE — Telephone Encounter (Signed)
It would be best if we saw her before we change the type of mammogram that she is getting

## 2016-03-21 NOTE — Telephone Encounter (Signed)
Spoke with pt, advised to come in. Pt understood.

## 2016-03-21 NOTE — Telephone Encounter (Signed)
Can we order? 

## 2016-03-24 ENCOUNTER — Ambulatory Visit (INDEPENDENT_AMBULATORY_CARE_PROVIDER_SITE_OTHER): Payer: Medicare Other | Admitting: Physician Assistant

## 2016-03-24 VITALS — BP 120/72 | HR 78 | Temp 98.0°F | Resp 18 | Ht 67.0 in | Wt 235.0 lb

## 2016-03-24 DIAGNOSIS — Z1239 Encounter for other screening for malignant neoplasm of breast: Secondary | ICD-10-CM

## 2016-03-24 DIAGNOSIS — R234 Changes in skin texture: Secondary | ICD-10-CM

## 2016-03-24 DIAGNOSIS — B372 Candidiasis of skin and nail: Secondary | ICD-10-CM

## 2016-03-24 MED ORDER — FLUCONAZOLE 150 MG PO TABS: ORAL_TABLET | ORAL | Status: DC

## 2016-03-24 NOTE — Progress Notes (Signed)
[facility] [address] [place] [address] [phone]  Date:  03/24/2016   Name:  [name] [name]   DOB:  [birth year]   MRN:  [id number]  PCP:  No primary care provider on file.    Chief Complaint: Referral   History of Present Illness:  This is a 75 y.o. female with PMH HLD, DM, HTN who is presenting stating she needs a referral for a diagnostic mammogram. She was seen here about a month ago by Dr Linna Darner and referred for screening mammogram. They called to schedule her. They were asking her questions and she admitted to having bilateral breast redness. They told her because of this she would need to be referred for diagnostic mammo. Pt states for the past 2-3 years she has had redness and itching under her breasts. The itching only bothers her at night. She states for many years she has been placing a piece of cotton fleece in her breast creases to avoid irritation from her bra. She washes these pieces in laundry regularly.  Review of Systems:  Review of Systems See HPI  Patient Active Problem List   Diagnosis Date Noted  . Hyperlipidemia 02/24/2016  . Diabetes ([place]) 09/02/2013  . HTN (hypertension) 02/06/2013    Prior to Admission medications   Medication Sig Start Date End Date Taking? Authorizing Provider  aspirin 81 MG tablet Take 81 mg by mouth daily.   Yes Historical Provider, MD  losartan-hydrochlorothiazide (HYZAAR) 100-25 MG tablet TAKE 1 TABLET BY MOUTH DAILY. 02/24/16  Yes Posey Boyer, MD  Multiple Vitamins-Minerals (ONE-A-DAY 50 PLUS PO) Take by mouth.   Yes Historical Provider, MD    No Known Allergies  Past Surgical History  Procedure Laterality Date  . Cholecystectomy    . Abdominal hysterectomy      Social History  Substance Use Topics  . Smoking status: Never Smoker   . Smokeless tobacco: None  . Alcohol Use: None    Family History  Problem Relation Age of Onset  . Hypertension Mother   . Hypertension Father   . Diabetes  Paternal Grandmother     Medication list has been reviewed and updated.  Physical Examination:  Physical Exam  Constitutional: She is oriented to person, place, and time. She appears well-developed and well-nourished. No distress.  HENT:  Head: Normocephalic and atraumatic.  Right Ear: Hearing normal.  Left Ear: Hearing normal.  Nose: Nose normal.  Eyes: Conjunctivae and lids are normal. Right eye exhibits no discharge. Left eye exhibits no discharge. No scleral icterus.  Pulmonary/Chest: Effort normal. No respiratory distress. Right breast exhibits skin change. Right breast exhibits no inverted nipple, no mass, no nipple discharge and no tenderness. Left breast exhibits skin change. Left breast exhibits no inverted nipple, no mass, no nipple discharge and no tenderness. Breasts are symmetrical.  Bilateral inframammary creases with beefy red kissing rashes.  Musculoskeletal: Normal range of motion.  Neurological: She is alert and oriented to person, place, and time.  Skin: Skin is warm, dry and intact.  Psychiatric: She has a normal mood and affect. Her speech is normal and behavior is normal. Thought content normal.    BP 120/72 mmHg  Pulse 78  Temp(Src) 98 F (36.7 C) (Oral)  Resp 18  Ht 5\' 7"  (1.702 m)  Wt 235 lb (106.595 kg)  BMI 36.80 kg/m2  SpO2 97%  Assessment and Plan:   1. Breast cancer screening 2. Breast skin changes 3. Intertriginous candidiasis Went ahead  and referred for diagnostic mammo even though skin changes d/t yeast infection. Treat yeast inf with fluconazole 150 mg weekly for 4-6 weeks. May stop after 4 weeks if rash has resolved. Continue for 6 weeks if hasn't yet resolved. Return as needed. - MM DIAG BREAST TOMO BILATERAL; Future - fluconazole (DIFLUCAN) 150 MG tablet; Take 1 tab po weekly x 4-6 weeks, or until symptoms resolve.  Dispense: 6 tablet; Refill: 0   Benjaman Pott. Drenda Freeze, MHS [facility]  03/24/2016

## 2016-03-24 NOTE — Patient Instructions (Addendum)
You will get phone call about scheduling mammogram. Take diflucan 150 mg weekly x 4-6 weeks.... May stop before 6 weeks is up if your rash resolves before then. Try to stay dry in that area as best you can.    IF you received an x-ray today, you will receive an invoice from [facility]. Please contact [facility] at [phone] with questions or concerns regarding your invoice.   IF you received labwork today, you will receive an invoice from [employer]. Please contact [facility] at [phone] with questions or concerns regarding your invoice.   Our billing staff will not be able to assist you with questions regarding bills from these companies.  You will be contacted with the lab results as soon as they are available. The fastest way to get your results is to activate your My Chart account. Instructions are located on the last page of this paperwork. If you have not heard from [place] regarding the results in 2 weeks, please contact this office.

## 2016-04-10 ENCOUNTER — Ambulatory Visit
Admission: RE | Admit: 2016-04-10 | Discharge: 2016-04-10 | Disposition: A | Discharge status: Home / Self Care | Payer: Medicare Other | Source: Ambulatory Visit | Attending: Physician Assistant | Admitting: Physician Assistant

## 2016-04-10 ENCOUNTER — Other Ambulatory Visit: Payer: Self-pay | Admitting: Physician Assistant

## 2016-04-10 ENCOUNTER — Other Ambulatory Visit: Payer: Self-pay | Admitting: *Deleted

## 2016-04-10 DIAGNOSIS — R928 Other abnormal and inconclusive findings on diagnostic imaging of breast: Secondary | ICD-10-CM | POA: Diagnosis not present

## 2016-04-10 DIAGNOSIS — N6489 Other specified disorders of breast: Secondary | ICD-10-CM | POA: Diagnosis not present

## 2016-04-10 DIAGNOSIS — R234 Changes in skin texture: Secondary | ICD-10-CM

## 2016-04-10 DIAGNOSIS — Z1239 Encounter for other screening for malignant neoplasm of breast: Secondary | ICD-10-CM

## 2016-10-03 ENCOUNTER — Telehealth: Payer: Self-pay

## 2016-10-03 NOTE — Telephone Encounter (Signed)
Pt had future order put in for this month by [facility]. [facility] is asking that it be changed to "tomo" before they can schedule. Thank you!

## 2016-11-22 DIAGNOSIS — M1711 Unilateral primary osteoarthritis, right knee: Secondary | ICD-10-CM | POA: Diagnosis not present

## 2016-11-22 DIAGNOSIS — M19011 Primary osteoarthritis, right shoulder: Secondary | ICD-10-CM | POA: Diagnosis not present

## 2017-01-29 DIAGNOSIS — M1711 Unilateral primary osteoarthritis, right knee: Secondary | ICD-10-CM

## 2017-02-14 ENCOUNTER — Other Ambulatory Visit: Payer: Self-pay | Admitting: Family Medicine

## 2017-02-14 DIAGNOSIS — I10 Essential (primary) hypertension: Secondary | ICD-10-CM

## 2017-03-18 ENCOUNTER — Other Ambulatory Visit: Payer: Self-pay | Admitting: Physician Assistant

## 2017-03-18 DIAGNOSIS — I10 Essential (primary) hypertension: Secondary | ICD-10-CM

## 2017-04-15 ENCOUNTER — Other Ambulatory Visit: Payer: Self-pay | Admitting: Physician Assistant

## 2017-04-15 DIAGNOSIS — I10 Essential (primary) hypertension: Secondary | ICD-10-CM

## 2017-05-17 ENCOUNTER — Other Ambulatory Visit: Payer: Self-pay | Admitting: Family Medicine

## 2017-05-17 DIAGNOSIS — N6489 Other specified disorders of breast: Secondary | ICD-10-CM

## 2017-05-23 ENCOUNTER — Other Ambulatory Visit: Payer: Self-pay | Admitting: Family Medicine

## 2017-05-23 ENCOUNTER — Ambulatory Visit
Admission: RE | Admit: 2017-05-23 | Discharge: 2017-05-23 | Disposition: A | Discharge status: Home / Self Care | Payer: Medicare Other | Source: Ambulatory Visit | Attending: Family Medicine | Admitting: Family Medicine

## 2017-05-23 DIAGNOSIS — N6489 Other specified disorders of breast: Secondary | ICD-10-CM

## 2017-08-22 ENCOUNTER — Encounter (INDEPENDENT_AMBULATORY_CARE_PROVIDER_SITE_OTHER): Payer: Medicare Other | Admitting: Ophthalmology

## 2017-08-24 ENCOUNTER — Encounter (INDEPENDENT_AMBULATORY_CARE_PROVIDER_SITE_OTHER): Payer: Medicare Other | Admitting: Ophthalmology

## 2017-08-24 DIAGNOSIS — H2513 Age-related nuclear cataract, bilateral: Secondary | ICD-10-CM

## 2017-08-24 DIAGNOSIS — H353132 Nonexudative age-related macular degeneration, bilateral, intermediate dry stage: Secondary | ICD-10-CM | POA: Diagnosis not present

## 2017-08-24 DIAGNOSIS — H43813 Vitreous degeneration, bilateral: Secondary | ICD-10-CM | POA: Diagnosis not present

## 2017-08-24 DIAGNOSIS — I10 Essential (primary) hypertension: Secondary | ICD-10-CM | POA: Diagnosis not present

## 2017-08-24 DIAGNOSIS — H35033 Hypertensive retinopathy, bilateral: Secondary | ICD-10-CM

## 2017-09-26 ENCOUNTER — Other Ambulatory Visit: Payer: Self-pay | Admitting: Family Medicine

## 2017-09-26 DIAGNOSIS — N6489 Other specified disorders of breast: Secondary | ICD-10-CM

## 2018-05-29 ENCOUNTER — Ambulatory Visit
Admission: RE | Admit: 2018-05-29 | Discharge: 2018-05-29 | Disposition: A | Discharge status: Home / Self Care | Payer: Medicare Other | Source: Ambulatory Visit | Attending: Family Medicine | Admitting: Family Medicine

## 2018-05-29 DIAGNOSIS — N6489 Other specified disorders of breast: Secondary | ICD-10-CM

## 2020-05-18 DIAGNOSIS — I2510 Atherosclerotic heart disease of native coronary artery without angina pectoris: Secondary | ICD-10-CM

## 2020-05-18 DIAGNOSIS — I499 Cardiac arrhythmia, unspecified: Secondary | ICD-10-CM

## 2020-05-18 HISTORY — DX: Atherosclerotic heart disease of native coronary artery without angina pectoris: I25.10

## 2020-05-18 HISTORY — DX: Cardiac arrhythmia, unspecified: I49.9

## 2020-06-01 ENCOUNTER — Other Ambulatory Visit: Payer: Self-pay

## 2020-06-01 ENCOUNTER — Encounter (HOSPITAL_COMMUNITY): Payer: Self-pay | Admitting: *Deleted

## 2020-06-01 ENCOUNTER — Inpatient Hospital Stay (HOSPITAL_COMMUNITY)
Admission: AD | Admit: 2020-06-01 | Discharge: 2020-06-04 | DRG: 287 | Disposition: A | Discharge status: Home / Self Care | Payer: Medicare Other | Attending: Internal Medicine | Admitting: Internal Medicine

## 2020-06-01 ENCOUNTER — Emergency Department (HOSPITAL_COMMUNITY): Payer: Medicare Other

## 2020-06-01 DIAGNOSIS — M1990 Unspecified osteoarthritis, unspecified site: Secondary | ICD-10-CM | POA: Diagnosis present

## 2020-06-01 DIAGNOSIS — E876 Hypokalemia: Secondary | ICD-10-CM | POA: Diagnosis present

## 2020-06-01 DIAGNOSIS — Z79899 Other long term (current) drug therapy: Secondary | ICD-10-CM | POA: Diagnosis not present

## 2020-06-01 DIAGNOSIS — Z7982 Long term (current) use of aspirin: Secondary | ICD-10-CM

## 2020-06-01 DIAGNOSIS — I4819 Other persistent atrial fibrillation: Principal | ICD-10-CM | POA: Diagnosis present

## 2020-06-01 DIAGNOSIS — E119 Type 2 diabetes mellitus without complications: Secondary | ICD-10-CM | POA: Diagnosis present

## 2020-06-01 DIAGNOSIS — I4891 Unspecified atrial fibrillation: Secondary | ICD-10-CM | POA: Diagnosis not present

## 2020-06-01 DIAGNOSIS — E785 Hyperlipidemia, unspecified: Secondary | ICD-10-CM | POA: Diagnosis present

## 2020-06-01 DIAGNOSIS — R072 Precordial pain: Secondary | ICD-10-CM

## 2020-06-01 DIAGNOSIS — Z20822 Contact with and (suspected) exposure to covid-19: Secondary | ICD-10-CM | POA: Diagnosis present

## 2020-06-01 DIAGNOSIS — I10 Essential (primary) hypertension: Secondary | ICD-10-CM

## 2020-06-01 DIAGNOSIS — Z833 Family history of diabetes mellitus: Secondary | ICD-10-CM

## 2020-06-01 DIAGNOSIS — E669 Obesity, unspecified: Secondary | ICD-10-CM | POA: Diagnosis present

## 2020-06-01 DIAGNOSIS — I350 Nonrheumatic aortic (valve) stenosis: Secondary | ICD-10-CM | POA: Diagnosis not present

## 2020-06-01 DIAGNOSIS — E7800 Pure hypercholesterolemia, unspecified: Secondary | ICD-10-CM

## 2020-06-01 DIAGNOSIS — R609 Edema, unspecified: Secondary | ICD-10-CM | POA: Diagnosis not present

## 2020-06-01 DIAGNOSIS — Z8249 Family history of ischemic heart disease and other diseases of the circulatory system: Secondary | ICD-10-CM | POA: Diagnosis not present

## 2020-06-01 DIAGNOSIS — R079 Chest pain, unspecified: Secondary | ICD-10-CM | POA: Diagnosis not present

## 2020-06-01 DIAGNOSIS — Z6839 Body mass index (BMI) 39.0-39.9, adult: Secondary | ICD-10-CM | POA: Diagnosis not present

## 2020-06-01 LAB — BASIC METABOLIC PANEL
Anion gap: 14 (ref 5–15)
Anion gap: 12 (ref 5–15)
BUN: 21 mg/dL (ref 8–23)
BUN: 19 mg/dL (ref 8–23)
CO2: 20 mmol/L — ABNORMAL LOW (ref 22–32)
CO2: 22 mmol/L (ref 22–32)
Calcium: 9.2 mg/dL (ref 8.9–10.3)
Calcium: 9.2 mg/dL (ref 8.9–10.3)
Chloride: 105 mmol/L (ref 98–111)
Chloride: 107 mmol/L (ref 98–111)
Creatinine, Ser: 0.93 mg/dL (ref 0.44–1.00)
Creatinine, Ser: 0.84 mg/dL (ref 0.44–1.00)
GFR calc Af Amer: >60 mL/min (ref >60)
GFR calc Af Amer: >60 mL/min (ref >60)
GFR calc non Af Amer: 60 mL/min — ABNORMAL LOW (ref >60)
GFR calc non Af Amer: >60 mL/min (ref >60)
Glucose, Bld: 138 mg/dL — ABNORMAL HIGH (ref 70–99)
Glucose, Bld: 115 mg/dL — ABNORMAL HIGH (ref 70–99)
Potassium: 3 mmol/L — ABNORMAL LOW (ref 3.5–5.1)
Potassium: 3.3 mmol/L — ABNORMAL LOW (ref 3.5–5.1)
Sodium: 139 mmol/L (ref 135–145)
Sodium: 141 mmol/L (ref 135–145)

## 2020-06-01 LAB — HEPARIN LEVEL (UNFRACTIONATED): Heparin Unfractionated: 0.7 IU/mL (ref 0.30–0.70)

## 2020-06-01 LAB — PHOSPHORUS: Phosphorus: 3.7 mg/dL (ref 2.5–4.6)

## 2020-06-01 LAB — BRAIN NATRIURETIC PEPTIDE: B Natriuretic Peptide: 298 pg/mL — ABNORMAL HIGH (ref 0.0–100.0)

## 2020-06-01 LAB — MAGNESIUM
Magnesium: 1.8 mg/dL (ref 1.7–2.4)
Magnesium: 2 mg/dL (ref 1.7–2.4)

## 2020-06-01 LAB — CBC
HCT: 40.7 % (ref 36.0–46.0)
Hemoglobin: 13.5 g/dL (ref 12.0–15.0)
MCH: 29.4 pg (ref 26.0–34.0)
MCHC: 33.2 g/dL (ref 30.0–36.0)
MCV: 88.7 fL (ref 80.0–100.0)
Platelets: 245 10*3/uL (ref 150–400)
RBC: 4.59 MIL/uL (ref 3.87–5.11)
RDW: 15.6 % — ABNORMAL HIGH (ref 11.5–15.5)
WBC: 9.3 10*3/uL (ref 4.0–10.5)
nRBC: 0 % (ref 0.0–0.2)

## 2020-06-01 LAB — LIPID PANEL
Cholesterol: 184 mg/dL (ref 0–200)
HDL: 51 mg/dL (ref >40)
LDL Cholesterol: 120 mg/dL — ABNORMAL HIGH (ref 0–99)
Total CHOL/HDL Ratio: 3.6 RATIO
Triglycerides: 67 mg/dL (ref <150)
VLDL: 13 mg/dL (ref 0–40)

## 2020-06-01 LAB — SARS CORONAVIRUS 2 BY RT PCR (HOSPITAL ORDER, PERFORMED IN ~~LOC~~ HOSPITAL LAB): SARS Coronavirus 2: NEGATIVE

## 2020-06-01 LAB — TROPONIN I (HIGH SENSITIVITY)
Troponin I (High Sensitivity): 16 ng/L (ref <18)
Troponin I (High Sensitivity): 14 ng/L (ref <18)

## 2020-06-01 LAB — TSH: TSH: 1.72 u[IU]/mL (ref 0.350–4.500)

## 2020-06-01 MED ORDER — POTASSIUM CHLORIDE CRYS ER 20 MEQ PO TBCR
40.0000 meq | EXTENDED_RELEASE_TABLET | Freq: Once | ORAL | Status: AC
  Administered 2020-06-01: 40 meq via ORAL
  Filled 2020-06-01: qty 2

## 2020-06-01 MED ORDER — HEPARIN BOLUS VIA INFUSION
4000.0000 [IU] | Freq: Once | INTRAVENOUS | Status: AC
  Administered 2020-06-01: 4000 [IU] via INTRAVENOUS
  Filled 2020-06-01: qty 4000

## 2020-06-01 MED ORDER — HEPARIN (PORCINE) 25000 UT/250ML-% IV SOLN
1200.0000 [IU]/h | INTRAVENOUS | Status: DC
  Administered 2020-06-01 – 2020-06-03 (×3): 1200 [IU]/h via INTRAVENOUS
  Filled 2020-06-01 (×3): qty 250

## 2020-06-01 MED ORDER — DILTIAZEM HCL-DEXTROSE 125-5 MG/125ML-% IV SOLN (PREMIX)
5.0000 mg/h | INTRAVENOUS | Status: DC
  Administered 2020-06-01 – 2020-06-03 (×3): 5 mg/h via INTRAVENOUS
  Filled 2020-06-01 (×3): qty 125

## 2020-06-01 MED ORDER — TRAZODONE HCL 50 MG PO TABS: 25.0000 mg | ORAL_TABLET | Freq: Every evening | ORAL | Status: DC | PRN

## 2020-06-01 MED ORDER — ACETAMINOPHEN 325 MG PO TABS
650.0000 mg | ORAL_TABLET | Freq: Four times a day (QID) | ORAL | Status: DC | PRN
  Filled 2020-06-01: qty 2

## 2020-06-01 MED ORDER — OXYCODONE HCL 5 MG PO TABS: 5.0000 mg | ORAL_TABLET | ORAL | Status: DC | PRN

## 2020-06-01 MED ORDER — ACETAMINOPHEN 650 MG RE SUPP: 650.0000 mg | Freq: Four times a day (QID) | RECTAL | Status: DC | PRN

## 2020-06-01 MED ORDER — ASPIRIN EC 81 MG PO TBEC
81.0000 mg | DELAYED_RELEASE_TABLET | Freq: Every day | ORAL | Status: DC
  Administered 2020-06-01 – 2020-06-03 (×3): 81 mg via ORAL
  Filled 2020-06-01 (×3): qty 1

## 2020-06-01 MED ORDER — POLYETHYLENE GLYCOL 3350 17 G PO PACK: 17.0000 g | PACK | Freq: Every day | ORAL | Status: DC | PRN

## 2020-06-01 MED ORDER — DILTIAZEM LOAD VIA INFUSION
20.0000 mg | Freq: Once | INTRAVENOUS | Status: AC
  Administered 2020-06-01: 20 mg via INTRAVENOUS
  Filled 2020-06-01: qty 20

## 2020-06-01 MED ORDER — SODIUM CHLORIDE 0.9% FLUSH
3.0000 mL | Freq: Once | INTRAVENOUS | Status: AC
  Administered 2020-06-01: 3 mL via INTRAVENOUS

## 2020-06-01 NOTE — ED Triage Notes (Signed)
Pt complains of pain in jaw since Sunday, chest pressure yesterday that is worse today. Pt felt short of breath walking around today.

## 2020-06-01 NOTE — Progress Notes (Signed)
[place] for IV heparin Indication: atrial fibrillation  No Known Allergies  Patient Measurements: Height: 5\' 6"  (167.6 cm) Weight: 111.1 kg (245 lb) IBW/kg (Calculated) : 59.3 Heparin Dosing Weight: 85 kg  Vital Signs: Temp: 97.5 F (36.4 C) (06/15 1200) Temp Source: Oral (06/15 1200) BP: 115/58 (06/15 1330) Pulse Rate: 86 (06/15 1345)  Labs: Recent Labs    06/01/20 1241  HGB 13.5  HCT 40.7  PLT 245  CREATININE 0.93  TROPONINIHS 16    Estimated Creatinine Clearance: 65 mL/min (by C-G formula based on SCr of 0.93 mg/dL).   Medical History: Past Medical History:  Diagnosis Date  . Anemia   . Arthritis   . Diabetes mellitus without complication ([place])   . Hypertension     Medications:  (Not in a hospital admission)  Scheduled:  . heparin  4,000 Units Intravenous Once   Infusions:  . diltiazem (CARDIZEM) infusion 5 mg/hr (06/01/20 1248)  . heparin     PRN:   Assessment: 62 yoF with PMH HTN, DM2, presents 6/15 with chest/jaw pain and palpitations x 2 days. Found to be in Afib with RVR. Pharmacy to dose IV heparin; Cardiology will see in AM   Baseline INR, aPTT: not done  Prior anticoagulation: none  Significant events:  Today, 06/01/2020:  CBC: WNL  No bleeding or infusion issues per nursing  SCr WNL  Goal of Therapy: Heparin level 0.3-0.7 units/ml Monitor platelets by anticoagulation protocol: Yes  Plan:  Heparin 4000 units IV bolus x 1  Heparin 1200 units/hr IV infusion  Check heparin level 8 hrs after start  Daily CBC, daily heparin level once stable  Monitor for signs of bleeding or thrombosis  Reuel Boom, PharmD, BCPS [phone] 06/01/2020, 2:44 PM

## 2020-06-01 NOTE — ED Provider Notes (Signed)
[facility] DEPT Provider Note   CSN: [id number] Arrival date & time: 06/01/20  1149     History Chief Complaint  Patient presents with  . Chest Pain    [name] [name] is a 79 y.o. female with a past medical history of hypertension, diet-controlled diabetes presenting to the ED with a chief complaint of chest pain.  On 05/30/2020 started having left-sided jaw pain.  Woke up yesterday with chest pain which she states was intermittent.  Pain is worse with exerting herself, even walking around at home.  This morning woke up with continued chest discomfort as well as palpitations.  She feels as though her heart is racing.  She was told in the past that her "heart skips a beat sometimes."  She was never put on any medications for this.  She had an echo done greater than 10 years ago.  She does not regularly follow-up with a cardiologist.  She denies any unilateral leg swelling, does note that both of her legs do get swollen for time to time which improved with elevation.  She denies any hemoptysis, anticoagulant use, injuries or falls, vomiting, diarrhea, abdominal pain, fever. No history of DVT, PE, MI or thyroid issues.  HPI     Past Medical History:  Diagnosis Date  . Anemia   . Arthritis   . Diabetes mellitus without complication ([place])   . Hypertension     Patient Active Problem List   Diagnosis Date Noted  . Osteoarthritis of right knee 01/29/2017  . Hyperlipidemia 02/24/2016  . Diabetes ([place]) 09/02/2013  . HTN (hypertension) 02/06/2013    Past Surgical History:  Procedure Laterality Date  . ABDOMINAL HYSTERECTOMY    . CHOLECYSTECTOMY       OB History   No obstetric history on file.     Family History  Problem Relation Age of Onset  . Hypertension Mother   . Hypertension Father   . Diabetes Paternal Grandmother     Social History   Tobacco Use  . Smoking status: Never Smoker  . Smokeless tobacco: Never Used  Substance Use Topics  .  Alcohol use: Not on file  . Drug use: Not on file    Home Medications Prior to Admission medications   Medication Sig Start Date End Date Taking? Authorizing Provider  acetaminophen (TYLENOL) 500 MG tablet Take 500-1,000 mg by mouth every 6 (six) hours as needed (shoulder pain).   Yes [provider]  aspirin 81 MG tablet Take 162 mg by mouth 3 (three) times daily as needed (chest pain or tightness).    Yes [provider]  CALCIUM PO Take 1 tablet by mouth See admin instructions. Take one tablet daily, take an additional one tablet every three days.   Yes [provider]  Lidocaine (BLUE-EMU PAIN RELIEF DRY EX) Apply 1 application topically in the morning and at bedtime. Both shoulders and both knees   Yes [provider]  losartan-hydrochlorothiazide (HYZAAR) 100-25 MG tablet TAKE 1 TABLET BY MOUTH EVERY DAY Patient taking differently: Take 1 tablet by mouth daily.  03/19/17  Yes Tereasa Coop, PA-C  Multiple Vitamins-Minerals (ONE-A-DAY 50 PLUS PO) Take 1 tablet by mouth daily.    Yes [provider]  Multiple Vitamins-Minerals (PRESERVISION AREDS 2 PO) Take 1 capsule by mouth in the morning and at bedtime.   Yes [provider]    Allergies    Patient has no known allergies.  Review of Systems   Review  of Systems  Constitutional: Negative for appetite change, chills and fever.  HENT: Negative for ear pain, rhinorrhea, sneezing and sore throat.   Eyes: Negative for photophobia and visual disturbance.  Respiratory: Negative for cough, chest tightness, shortness of breath and wheezing.   Cardiovascular: Positive for chest pain and palpitations.  Gastrointestinal: Negative for abdominal pain, blood in stool, constipation, diarrhea, nausea and vomiting.  Genitourinary: Negative for dysuria, hematuria and urgency.  Musculoskeletal: Negative for myalgias.  Skin: Negative for rash.  Neurological: Negative for dizziness, weakness and  light-headedness.    Physical Exam Updated Vital Signs BP (!) 115/58   Pulse 86   Temp (!) 97.5 F (36.4 C) (Oral)   Resp 17   SpO2 98%   Physical Exam Vitals and nursing note reviewed.  Constitutional:      General: She is not in acute distress.    Appearance: She is well-developed.  HENT:     Head: Normocephalic and atraumatic.     Nose: Nose normal.  Eyes:     General: No scleral icterus.       Left eye: No discharge.     Conjunctiva/sclera: Conjunctivae normal.  Cardiovascular:     Rate and Rhythm: Tachycardia present. Rhythm irregularly irregular.     Heart sounds: Normal heart sounds. No murmur heard.  No friction rub. No gallop.   Pulmonary:     Effort: Pulmonary effort is normal. No respiratory distress.     Breath sounds: Normal breath sounds.  Abdominal:     General: Bowel sounds are normal. There is no distension.     Palpations: Abdomen is soft.     Tenderness: There is no abdominal tenderness. There is no guarding.  Musculoskeletal:        General: Normal range of motion.     Cervical back: Normal range of motion and neck supple.  Skin:    General: Skin is warm and dry.     Findings: No rash.  Neurological:     Mental Status: She is alert.     Motor: No abnormal muscle tone.     Coordination: Coordination normal.     ED Results / Procedures / Treatments   Labs (all labs ordered are listed, but only abnormal results are displayed) Labs Reviewed  BASIC METABOLIC PANEL - Abnormal; Notable for the following components:      Result Value   Potassium 3.0 (*)    CO2 20 (*)    Glucose, Bld 138 (*)    GFR calc non Af Amer 60 (*)    All other components within normal limits  CBC - Abnormal; Notable for the following components:   RDW 15.6 (*)    All other components within normal limits  BRAIN NATRIURETIC PEPTIDE - Abnormal; Notable for the following components:   B Natriuretic Peptide 298.0 (*)    All other components within normal limits  SARS  CORONAVIRUS 2 BY RT PCR (HOSPITAL ORDER, [facility])  TSH  MAGNESIUM  TROPONIN I (HIGH SENSITIVITY)  TROPONIN I (HIGH SENSITIVITY)    EKG EKG Interpretation  Date/Time:  Tuesday June 01 2020 12:02:57 EDT Ventricular Rate:  126 PR Interval:    QRS Duration: 92 QT Interval:  301 QTC Calculation: 436 R Axis:   86 Text Interpretation: Atrial fibrillation Borderline right axis deviation Low voltage, precordial leads Nonspecific repol abnormality, diffuse leads 12 Lead; Mason-Likar No old tracing to compare Confirmed by Lacretia Leigh ([id number]) on 06/01/2020 12:27:36 PM  Radiology DG Chest 2 View  Result Date: 06/01/2020 CLINICAL DATA:  Chest pain, short of breath EXAM: CHEST - 2 VIEW COMPARISON:  12/28/2010 FINDINGS: Normal mediastinum and cardiac silhouette. Chronic central bronchitic markings. Normal pulmonary vasculature. No effusion, infiltrate, or pneumothorax. IMPRESSION: Chronic bronchitic markings.  No acute findings. Electronically Signed   By: Suzy Bouchard M.D.   On: 06/01/2020 12:50    Procedures .Critical Care Performed by: Delia Heady, PA-C Authorized by: Delia Heady, PA-C   Critical care provider statement:    Critical care time (minutes):  35   Critical care was necessary to treat or prevent imminent or life-threatening deterioration of the following conditions:  Cardiac failure, circulatory failure and respiratory failure   Critical care was time spent personally by me on the following activities:  Development of treatment plan with patient or surrogate, discussions with consultants, evaluation of patient's response to treatment, examination of patient, obtaining history from patient or surrogate, ordering and performing treatments and interventions, ordering and review of laboratory studies, ordering and review of radiographic studies, re-evaluation of patient's condition and review of old charts   I assumed direction of critical care for  this patient from another provider in my specialty: no     (including critical care time)  Medications Ordered in ED Medications  diltiazem (CARDIZEM) 1 mg/mL load via infusion 20 mg (20 mg Intravenous Bolus from Bag 06/01/20 1249)    And  diltiazem (CARDIZEM) 125 mg in dextrose 5% 125 mL (1 mg/mL) infusion (5 mg/hr Intravenous New Bag/Given 06/01/20 1248)  sodium chloride flush (NS) 0.9 % injection 3 mL (3 mLs Intravenous Given 06/01/20 1242)  potassium chloride SA (KLOR-CON) CR tablet 40 mEq (40 mEq Oral Given 06/01/20 1355)    ED Course  I have reviewed the triage vital signs and the nursing notes.  Pertinent labs & imaging results that were available during my care of the patient were reviewed by me and considered in my medical decision making (see chart for details).  Clinical Course as of Jun 02 1411  Tue Jun 01, 2020  1343 Repleted orally.  Potassium(!): 3.0 [HK]  1343 Troponin I (High Sensitivity): 16 [HK]  1344 TSH: 1.720 [HK]  1344 Improved with cardizem. Remains in Afib.  Pulse Rate: 95 [HK]  1400 Spoke to [facility] from cardiology. They recommend medicine admission for consult in the AM. Will begin anticoagulation with heparin.   [HK]    Clinical Course User Index [HK] Delia Heady, PA-C   MDM Rules/Calculators/A&P     CHA2DS2-VASc Score: 74                     79 year old female with a past medical history of hypertension, diet-controlled diabetes presenting to the ED with a chief complaint of chest pain.  2 days ago started having left-sided jaw pain, woke up yesterday with chest pain and today woke up with feeling like her heart is racing.  She was told in the past that her heart "skips a beat sometimes" but denies history of irregular heartbeat.  She was never put on any antiarrhythmics.  She denies unilateral leg swelling.  Patient with A. fib with rates in the 130s.  She denies history of same.  EKG shows atrial fibrillation.  Chest x-ray is unremarkable.  Lab work  significant for hypokalemia of 3 which is repleted orally.  BNP slightly elevated to 298.  Troponin is 16 initially.  Normal magnesium level.  Normal TSH.  CBC unremarkable.  Patient  was given a Cardizem bolus of 20 mg and started on a drip.  Her rate is now controlled in the 80s.  She remains in A. fib.  Per cardiology recommendations, will begin anticoagulation and admit to medicine service for cardiology consult in the morning.   All imaging, if done today, including plain films, CT scans, and ultrasounds, independently reviewed by me, and interpretations confirmed via formal radiology reads.  Portions of this note were generated with [employer]. Dictation errors may occur despite best attempts at proofreading.  Final Clinical Impression(s) / ED Diagnoses Final diagnoses:  Atrial fibrillation, unspecified type ([place])  Hypokalemia    Rx / DC Orders ED Discharge Orders    None       Delia Heady, PA-C 06/01/20 1412    Lacretia Leigh, MD 06/03/20 [phone]

## 2020-06-01 NOTE — H&P (Addendum)
[facility] History and Physical  KIMBERLEIGH MEHAN ZOX:[id number] DOB: 1941-07-08 DOA: 06/01/2020  Referring physician:  PCP: Glenis Smoker, MD   Chief Complaint: A. fib with RVR  HPI: [name] [name] is a 79 y.o. WF PMHx HLD, HTN, DM type II (diet-controlled),  Presenting to the ED with a chief complaint of chest pain.  On 05/30/2020 started having left-sided jaw pain.  Woke up yesterday with chest pain which she states was intermittent.  Pain is worse with exerting herself, even walking around at home.  This morning woke up with continued chest discomfort as well as palpitations.  She feels as though her heart is racing.  She was told in the past that her "heart skips a beat sometimes."  She was never put on any medications for this.  She had an echo done greater than 10 years ago.  She does not regularly follow-up with a cardiologist.  She denies any unilateral leg swelling, does note that both of her legs do get swollen for time to time which improved with elevation.  She denies any hemoptysis, anticoagulant use, injuries or falls, vomiting, diarrhea, abdominal pain, fever. No history of DVT, PE, MI or thyroid issues.    Review of Systems:  Covid vaccination; positive vaccination  Constitutional:  No weight loss, night sweats, Fevers, chills, fatigue.  HEENT:  No headaches, Difficulty swallowing,Tooth/dental problems,Sore throat,  No sneezing, itching, ear ache, nasal congestion, post nasal drip,  Cardio-vascular:  No chest pain, Orthopnea, PND, swelling in lower extremities, anasarca, dizziness, palpitations  GI:  No heartburn, indigestion, abdominal pain, nausea, vomiting, diarrhea, change in bowel habits, loss of appetite  Resp:  No shortness of breath with exertion or at rest. No excess mucus, no productive cough, No non-productive cough, No coughing up of blood.No change in color of mucus.No wheezing.No chest wall deformity  Skin:  no rash or lesions.  GU:  no dysuria,  change in color of urine, no urgency or frequency. No flank pain.  Musculoskeletal:  No joint pain or swelling. No decreased range of motion. No back pain.  Psych:  No change in mood or affect. No depression or anxiety. No memory loss.   Past Medical History:  Diagnosis Date  . Anemia   . Arthritis   . Diabetes mellitus without complication ([place])   . Hypertension    Past Surgical History:  Procedure Laterality Date  . ABDOMINAL HYSTERECTOMY    . CHOLECYSTECTOMY     Social History:  reports that she has never smoked. She has never used smokeless tobacco. No history on file for alcohol use and drug use.  No Known Allergies  Family History  Problem Relation Age of Onset  . Hypertension Mother   . Hypertension Father   . Diabetes Paternal Grandmother      Prior to Admission medications   Medication Sig Start Date End Date Taking? Authorizing Provider  acetaminophen (TYLENOL) 500 MG tablet Take 500-1,000 mg by mouth every 6 (six) hours as needed (shoulder pain).   Yes [provider]  aspirin 81 MG tablet Take 162 mg by mouth 3 (three) times daily as needed (chest pain or tightness).    Yes [provider]  CALCIUM PO Take 1 tablet by mouth See admin instructions. Take one tablet daily, take an additional one tablet every three days.   Yes [provider]  Lidocaine (BLUE-EMU PAIN RELIEF DRY EX) Apply 1 application topically in the morning and at bedtime. Both shoulders and both knees  Yes [provider]  losartan-hydrochlorothiazide (HYZAAR) 100-25 MG tablet TAKE 1 TABLET BY MOUTH EVERY DAY Patient taking differently: Take 1 tablet by mouth daily.  03/19/17  Yes Tereasa Coop, PA-C  Multiple Vitamins-Minerals (ONE-A-DAY 50 PLUS PO) Take 1 tablet by mouth daily.    Yes [provider]  Multiple Vitamins-Minerals (PRESERVISION AREDS 2 PO) Take 1 capsule by mouth in the morning and at bedtime.   Yes [provider]      Consultants:  Cardiology   Procedures/Significant Events:    I have personally reviewed and interpreted all radiology studies and my findings are as above.   VENTILATOR SETTINGS:    Cultures   Antimicrobials:    Devices    LINES / TUBES:      Continuous Infusions: . diltiazem (CARDIZEM) infusion 5 mg/hr (06/01/20 1248)  . heparin 1,200 Units/hr (06/01/20 1452)    Physical Exam: Vitals:   06/01/20 1415 06/01/20 1424 06/01/20 1430 06/01/20 1445  BP:   125/76   Pulse: 67  86 95  Resp: 19  16 18   Temp:      TempSrc:      SpO2: 97%  94% 98%  Weight:  111.1 kg    Height:  5\' 6"  (1.676 m)      Wt Readings from Last 3 Encounters:  06/01/20 111.1 kg  03/24/16 106.6 kg  02/24/16 106.8 kg    General: A/O x4, no acute respiratory distress Eyes: negative scleral hemorrhage, negative anisocoria, negative icterus ENT: Negative Runny nose, negative gingival bleeding, Neck:  Negative scars, masses, torticollis, lymphadenopathy, JVD Lungs: Clear to auscultation bilaterally without wheezes or crackles Cardiovascular: Irregular irregular rhythm and rate without murmur gallop or rub normal S1 and S2 Abdomen: OBESE, negative abdominal pain, nondistended, positive soft, bowel sounds, no rebound, no ascites, no appreciable mass Extremities: No significant cyanosis, clubbing, bilateral lower extremity edema 1-2+  Skin: Negative rashes, lesions, ulcers Psychiatric:  Negative depression, negative anxiety, negative fatigue, negative mania  Central nervous system:  Cranial nerves II through XII intact, tongue/uvula midline, all extremities muscle strength 5/5, sensation intact throughout,negative dysarthria, negative expressive aphasia, negative receptive aphasia.        Labs on Admission:  Basic Metabolic Panel: Recent Labs  Lab 06/01/20 1241  NA 139  K 3.0*  CL 105  CO2 20*  GLUCOSE 138*  BUN 21  CREATININE 0.93  CALCIUM 9.2  MG 1.8   Liver Function  Tests: No results for input(s): AST, ALT, ALKPHOS, BILITOT, PROT, ALBUMIN in the last 168 hours. No results for input(s): LIPASE, AMYLASE in the last 168 hours. No results for input(s): AMMONIA in the last 168 hours. CBC: Recent Labs  Lab 06/01/20 1241  WBC 9.3  HGB 13.5  HCT 40.7  MCV 88.7  PLT 245   Cardiac Enzymes: No results for input(s): CKTOTAL, CKMB, CKMBINDEX, TROPONINI in the last 168 hours.  BNP (last 3 results) Recent Labs    06/01/20 1241  BNP 298.0*    ProBNP (last 3 results) No results for input(s): PROBNP in the last 8760 hours.  CBG: No results for input(s): GLUCAP in the last 168 hours.  Radiological Exams on Admission: DG Chest 2 View  Result Date: 06/01/2020 CLINICAL DATA:  Chest pain, short of breath EXAM: CHEST - 2 VIEW COMPARISON:  12/28/2010 FINDINGS: Normal mediastinum and cardiac silhouette. Chronic central bronchitic markings. Normal pulmonary vasculature. No effusion, infiltrate, or pneumothorax. IMPRESSION: Chronic bronchitic markings.  No acute findings. Electronically Signed   By:  Suzy Bouchard M.D.   On: 06/01/2020 12:50    EKG: Independently reviewed.  A. fib rate controlled  Assessment/Plan Principal Problem:   Atrial fibrillation, new onset (HCC) Active Problems:   HTN (hypertension)   Diabetes ([place])   Hyperlipidemia  New onset A. fib -CHADSVASC score=5 -Cardiology contacted by ED physician will see in the a.m. -Trend troponin -TSH normal -Abnormal electrolytes -Currently rate controlled -Cardizem drip -Heparin drip  Essential HTN -See A. fib   Hypokalemia -Potassium goal> 4 -K-Dur 40 mEq -Repeat BMP/Mg pending    Code Status: Full (DVT Prophylaxis: Heparin drip Family Communication: 6/15 husband at bedside for discussion of plan of care Status is: Inpatient    Dispo: The patient is from: Home              Anticipated d/c is to: Home              Anticipated d/c date is: 6/17              Patient  currently unstable     Data Reviewed: Care during the described time interval was provided by me .  I have reviewed this patient's available data, including medical history, events of note, physical examination, and all test results as part of my evaluation.   The patient is critically ill with multiple organ systems failure and requires high complexity decision making for assessment and support, frequent evaluation and titration of therapies, application of advanced monitoring technologies and extensive interpretation of multiple databases. Critical Care Time devoted to patient care services described in this note  Time spent: 52 minutes   Wanda Rideout, [facility] Pager [phone]

## 2020-06-01 NOTE — ED Provider Notes (Signed)
Medical screening examination/treatment/procedure(s) were conducted as a shared visit with non-physician practitioner(s) and myself.  I personally evaluated the patient during the encounter.  EKG Interpretation  Date/Time:  Tuesday June 01 2020 12:02:57 EDT Ventricular Rate:  126 PR Interval:    QRS Duration: 92 QT Interval:  301 QTC Calculation: 436 R Axis:   86 Text Interpretation: Atrial fibrillation Borderline right axis deviation Low voltage, precordial leads Nonspecific repol abnormality, diffuse leads 12 Lead; Mason-Likar No old tracing to compare Confirmed by Lacretia Leigh [phone]) on 06/01/2020 12:21:39 PM  79 year old female presents with intermittent chest pain since yesterday.  She noticed palpitations when she woke this morning is found to be in new onset A. fib with RVR.  Will check labs, x-ray.  If patient does not continue cardiovert she will require electrical cardioversion.   Lacretia Leigh, MD 06/01/20 1240

## 2020-06-02 ENCOUNTER — Encounter (HOSPITAL_COMMUNITY)
Admission: AD | Disposition: A | Discharge status: Home / Self Care | Payer: Self-pay | Source: Home / Self Care | Attending: Internal Medicine

## 2020-06-02 ENCOUNTER — Inpatient Hospital Stay (HOSPITAL_COMMUNITY): Payer: Medicare Other

## 2020-06-02 DIAGNOSIS — R072 Precordial pain: Secondary | ICD-10-CM

## 2020-06-02 HISTORY — PX: LEFT HEART CATH AND CORONARY ANGIOGRAPHY: CATH118249

## 2020-06-02 LAB — CBC WITH DIFFERENTIAL/PLATELET
Abs Immature Granulocytes: 0.02 10*3/uL (ref 0.00–0.07)
Basophils Absolute: 0.1 10*3/uL (ref 0.0–0.1)
Basophils Relative: 1 %
Eosinophils Absolute: 0.3 10*3/uL (ref 0.0–0.5)
Eosinophils Relative: 3 %
HCT: 39.4 % (ref 36.0–46.0)
Hemoglobin: 12.7 g/dL (ref 12.0–15.0)
Immature Granulocytes: 0 %
Lymphocytes Relative: 26 %
Lymphs Abs: 2.4 10*3/uL (ref 0.7–4.0)
MCH: 28.9 pg (ref 26.0–34.0)
MCHC: 32.2 g/dL (ref 30.0–36.0)
MCV: 89.7 fL (ref 80.0–100.0)
Monocytes Absolute: 0.7 10*3/uL (ref 0.1–1.0)
Monocytes Relative: 8 %
Neutro Abs: 5.7 10*3/uL (ref 1.7–7.7)
Neutrophils Relative %: 62 %
Platelets: 219 10*3/uL (ref 150–400)
RBC: 4.39 MIL/uL (ref 3.87–5.11)
RDW: 15.4 % (ref 11.5–15.5)
WBC: 9.3 10*3/uL (ref 4.0–10.5)
nRBC: 0 % (ref 0.0–0.2)

## 2020-06-02 LAB — COMPREHENSIVE METABOLIC PANEL
ALT: 21 U/L (ref 0–44)
AST: 28 U/L (ref 15–41)
Albumin: 3.3 g/dL — ABNORMAL LOW (ref 3.5–5.0)
Alkaline Phosphatase: 62 U/L (ref 38–126)
Anion gap: 9 (ref 5–15)
BUN: 17 mg/dL (ref 8–23)
CO2: 22 mmol/L (ref 22–32)
Calcium: 8.7 mg/dL — ABNORMAL LOW (ref 8.9–10.3)
Chloride: 108 mmol/L (ref 98–111)
Creatinine, Ser: 0.62 mg/dL (ref 0.44–1.00)
GFR calc Af Amer: >60 mL/min (ref >60)
GFR calc non Af Amer: >60 mL/min (ref >60)
Glucose, Bld: 85 mg/dL (ref 70–99)
Potassium: 3.1 mmol/L — ABNORMAL LOW (ref 3.5–5.1)
Sodium: 139 mmol/L (ref 135–145)
Total Bilirubin: 0.6 mg/dL (ref 0.3–1.2)
Total Protein: 6.1 g/dL — ABNORMAL LOW (ref 6.5–8.1)

## 2020-06-02 LAB — GLUCOSE, CAPILLARY: Glucose-Capillary: 79 mg/dL (ref 70–99)

## 2020-06-02 LAB — PHOSPHORUS: Phosphorus: 2.9 mg/dL (ref 2.5–4.6)

## 2020-06-02 LAB — HEPARIN LEVEL (UNFRACTIONATED): Heparin Unfractionated: 0.67 IU/mL (ref 0.30–0.70)

## 2020-06-02 LAB — MAGNESIUM: Magnesium: 1.9 mg/dL (ref 1.7–2.4)

## 2020-06-02 SURGERY — LEFT HEART CATH AND CORONARY ANGIOGRAPHY
Anesthesia: LOCAL

## 2020-06-02 MED ORDER — VERAPAMIL HCL 2.5 MG/ML IV SOLN
INTRAVENOUS | Status: DC | PRN
  Administered 2020-06-02: 10 mL via INTRA_ARTERIAL

## 2020-06-02 MED ORDER — MIDAZOLAM HCL 2 MG/2ML IJ SOLN
INTRAMUSCULAR | Status: DC | PRN
  Administered 2020-06-02: 2 mg via INTRAVENOUS

## 2020-06-02 MED ORDER — MIDAZOLAM HCL 2 MG/2ML IJ SOLN
INTRAMUSCULAR | Status: AC
  Filled 2020-06-02: qty 2

## 2020-06-02 MED ORDER — SODIUM CHLORIDE 0.9 % IV SOLN: INTRAVENOUS | Status: DC

## 2020-06-02 MED ORDER — VERAPAMIL HCL 2.5 MG/ML IV SOLN
INTRAVENOUS | Status: AC
  Filled 2020-06-02: qty 2

## 2020-06-02 MED ORDER — POTASSIUM CHLORIDE CRYS ER 20 MEQ PO TBCR
40.0000 meq | EXTENDED_RELEASE_TABLET | ORAL | Status: AC
  Administered 2020-06-02 (×2): 40 meq via ORAL
  Filled 2020-06-02 (×2): qty 2

## 2020-06-02 MED ORDER — HEPARIN (PORCINE) IN NACL 1000-0.9 UT/500ML-% IV SOLN
INTRAVENOUS | Status: DC | PRN
  Administered 2020-06-02 (×2): 500 mL

## 2020-06-02 MED ORDER — ATORVASTATIN CALCIUM 20 MG PO TABS
20.0000 mg | ORAL_TABLET | Freq: Every day | ORAL | Status: DC
  Administered 2020-06-02 – 2020-06-04 (×3): 20 mg via ORAL
  Filled 2020-06-02 (×3): qty 1

## 2020-06-02 MED ORDER — LIDOCAINE HCL (PF) 1 % IJ SOLN
INTRAMUSCULAR | Status: DC | PRN
  Administered 2020-06-02: 2 mL

## 2020-06-02 MED ORDER — HEPARIN (PORCINE) IN NACL 1000-0.9 UT/500ML-% IV SOLN
INTRAVENOUS | Status: AC
  Filled 2020-06-02: qty 1000

## 2020-06-02 MED ORDER — IOHEXOL 350 MG/ML SOLN
INTRAVENOUS | Status: DC | PRN
  Administered 2020-06-02: 40 mL

## 2020-06-02 MED ORDER — HEPARIN SODIUM (PORCINE) 1000 UNIT/ML IJ SOLN
INTRAMUSCULAR | Status: AC
  Filled 2020-06-02: qty 1

## 2020-06-02 MED ORDER — FENTANYL CITRATE (PF) 100 MCG/2ML IJ SOLN
INTRAMUSCULAR | Status: AC
  Filled 2020-06-02: qty 2

## 2020-06-02 MED ORDER — SODIUM CHLORIDE 0.9% FLUSH
3.0000 mL | Freq: Two times a day (BID) | INTRAVENOUS | Status: DC
  Administered 2020-06-02: 3 mL via INTRAVENOUS

## 2020-06-02 MED ORDER — ONDANSETRON HCL 4 MG/2ML IJ SOLN: 4.0000 mg | Freq: Four times a day (QID) | INTRAMUSCULAR | Status: DC | PRN

## 2020-06-02 MED ORDER — LIDOCAINE HCL (PF) 1 % IJ SOLN
INTRAMUSCULAR | Status: AC
  Filled 2020-06-02: qty 30

## 2020-06-02 MED ORDER — SODIUM CHLORIDE 0.9 % IV SOLN: INTRAVENOUS | Status: AC

## 2020-06-02 MED ORDER — HEPARIN SODIUM (PORCINE) 1000 UNIT/ML IJ SOLN
INTRAMUSCULAR | Status: DC | PRN
  Administered 2020-06-02: 5000 [IU] via INTRAVENOUS

## 2020-06-02 MED ORDER — FENTANYL CITRATE (PF) 100 MCG/2ML IJ SOLN
INTRAMUSCULAR | Status: DC | PRN
  Administered 2020-06-02: 25 ug via INTRAVENOUS

## 2020-06-02 SURGICAL SUPPLY — 10 items
CATH 5FR JL3.5 JR4 ANG PIG MP (CATHETERS) ×1 IMPLANT
DEVICE RAD COMP TR BAND LRG (VASCULAR PRODUCTS) ×1 IMPLANT
GLIDESHEATH SLEND SS 6F .021 (SHEATH) ×1 IMPLANT
GUIDEWIRE INQWIRE 1.5J.035X260 (WIRE) IMPLANT
INQWIRE 1.5J .035X260CM (WIRE) ×2
KIT HEART LEFT (KITS) ×2 IMPLANT
PACK CARDIAC CATHETERIZATION (CUSTOM PROCEDURE TRAY) ×2 IMPLANT
SHEATH PROBE COVER 6X72 (BAG) ×1 IMPLANT
TRANSDUCER W/STOPCOCK (MISCELLANEOUS) ×2 IMPLANT
TUBING CIL FLEX 10 FLL-RA (TUBING) ×2 IMPLANT

## 2020-06-02 NOTE — CV Procedure (Signed)
Echocardiogram not completed, patient at [facility] for cath.  Darlina Sicilian RDCS

## 2020-06-02 NOTE — Consult Note (Addendum)
Cardiology Consultation:   Patient ID: [name] [name] MRN: [id number]; DOB: 03-26-1941  Admit date: 06/01/2020 Date of Consult: 06/02/2020  Primary Care Provider: Glenis Smoker, MD [facility] Cardiologist: No primary care provider on file. New- Dr. [facility] Electrophysiologist:  None    Patient Profile:   [name] [name] is a 79 y.o. female with a hx of HTN, HLD, DM-2, no cardiac hx except palpitations in past and remote echo who is being seen today for the evaluation of chest pain and atrial fib at the request of Dr. Posey Pronto.  History of Present Illness:   [name] [name] with above hx and only cardiac hx of palpitations and echo remotely, presented to ER yesterday with chest pain and jaw pain.  Discomfort in jaw began 6/13 and on the 14th chest pain.   She would take a couple of asprin with each episode and had 4-5 episodes on Monday and no awareness of heart beat.   On the 15th she had heart racing along with chest discomfort and came to ER.  No edema, no colds or fevers.  She has been having jaw and chest pressure for some time with rest and exertion but with working in garden she has to stop due to SOB and discomfort, with rest it improves.  Remote cardiologist 30 yrs ago with possible MI with gallbladder problem.  Was never told to come back.     In ER she was given IV dilt bolus and drip and IV heparin.  Her K+ was 3.0 Cr 0.93 Mg+ 1.8  BNP 298  Hs Troponin 16 and 154 Hgb 12.7 WBC 9.3 and plts 219 TSH 1.720 2V CXR Chronic bronchitic markings.  No acute findings.   EKG:  The EKG was personally reviewed and demonstrates:  06/01/20 A fib RVR 126 non specific repol abnormalities no old EKG to compare.   Telemetry:  Telemetry was personally reviewed and demonstrates:  A fib mostly rate controlled at 0200 HR up to 145   + Hx of lower ext edema despite decreasing salt intake.  No prior awareness of heart beat.  Racing began yesterday AM when she woke.   No hx of CVA, or  bleeding- except for bright blood occ. With BM just a smear.   Past Medical History:  Diagnosis Date  . Anemia   . Arthritis   . Diabetes mellitus without complication ([place])   . Hypertension     Past Surgical History:  Procedure Laterality Date  . ABDOMINAL HYSTERECTOMY    . CHOLECYSTECTOMY       Home Medications:  Prior to Admission medications   Medication Sig Start Date End Date Taking? Authorizing Provider  acetaminophen (TYLENOL) 500 MG tablet Take 500-1,000 mg by mouth every 6 (six) hours as needed (shoulder pain).   Yes [provider]  aspirin 81 MG tablet Take 162 mg by mouth 3 (three) times daily as needed (chest pain or tightness).    Yes [provider]  CALCIUM PO Take 1 tablet by mouth See admin instructions. Take one tablet daily, take an additional one tablet every three days.   Yes [provider]  Lidocaine (BLUE-EMU PAIN RELIEF DRY EX) Apply 1 application topically in the morning and at bedtime. Both shoulders and both knees   Yes [provider]  losartan-hydrochlorothiazide (HYZAAR) 100-25 MG tablet TAKE 1 TABLET BY MOUTH EVERY DAY Patient taking differently: Take 1 tablet by mouth daily.  03/19/17  Yes Tereasa Coop, PA-C  Multiple Vitamins-Minerals (ONE-A-DAY 50 PLUS PO) Take 1 tablet by mouth daily.    Yes [provider]  Multiple Vitamins-Minerals (PRESERVISION AREDS 2 PO) Take 1 capsule by mouth in the morning and at bedtime.   Yes [provider]    Inpatient Medications: Scheduled Meds: . aspirin EC  81 mg Oral Daily   Continuous Infusions: . diltiazem (CARDIZEM) infusion 5 mg/hr (06/02/20 0653)  . heparin 1,200 Units/hr (06/01/20 1452)   PRN Meds: acetaminophen **OR** acetaminophen, oxyCODONE, polyethylene glycol, traZODone  Allergies:   No Known Allergies  Social History:   Social History   Socioeconomic History  . Marital status: Married    Spouse name: Not on file  . Number of  children: Not on file  . Years of education: Not on file  . Highest education level: Not on file  Occupational History  . Not on file  Tobacco Use  . Smoking status: Never Smoker  . Smokeless tobacco: Never Used  Substance and Sexual Activity  . Alcohol use: Not on file  . Drug use: Not on file  . Sexual activity: Not Currently  Other Topics Concern  . Not on file  Social History Narrative  . Not on file   Social Determinants of Health   Financial Resource Strain:   . Difficulty of Paying Living Expenses:   Food Insecurity:   . Worried About Charity fundraiser in the Last Year:   . Arboriculturist in the Last Year:   Transportation Needs:   . Film/video editor (Medical):   [name] Lack of Transportation (Non-Medical):   Physical Activity:   . Days of Exercise per Week:   . Minutes of Exercise per Session:   Stress:   . Feeling of Stress :   Social Connections:   . Frequency of Communication with Friends and Family:   . Frequency of Social Gatherings with Friends and Family:   . Attends Religious Services:   . Active Member of Clubs or Organizations:   . Attends Archivist Meetings:   [name] Marital Status:   Intimate Partner Violence:   . Fear of Current or Ex-Partner:   . Emotionally Abused:   [name] Physically Abused:   . Sexually Abused:     Family History:    Family History  Problem Relation Age of Onset  . Hypertension Mother   . Hypertension Father   . Diabetes Paternal Grandmother      ROS:  Please see the history of present illness.  General:no colds or fevers, no weight changes Skin:no rashes or ulcers HEENT:no blurred vision, no congestion CV:see HPI PUL:see HPI GI:no diarrhea constipation or melena, no indigestion GU:no hematuria, no dysuria MS:no joint pain, no claudication Neuro:no syncope, no lightheadedness Endo:no diabetes, no thyroid disease  All other ROS reviewed and negative.     Physical Exam/Data:   Vitals:   06/01/20 1652  06/01/20 2158 06/02/20 0215 06/02/20 0635  BP: 116/87 139/78 118/64 127/68  Pulse: 86 92 84 89  Resp: 18 16 18    Temp: 97.7 F (36.5 C) 97.8 F (36.6 C) 97.7 F (36.5 C) 97.6 F (36.4 C)  TempSrc: Oral Oral Oral Oral  SpO2: 97% 99% 96% 98%  Weight:      Height:        Intake/Output Summary (Last 24 hours) at 06/02/2020 0751 Last data filed at 06/02/2020 0600 Gross per 24 hour  Intake 664.96 ml  Output --  Net 664.96 ml   Last  3 Weights 06/01/2020 06/01/2020 03/24/2016  Weight (lbs) 246 lb 6.4 oz 245 lb 235 lb  Weight (kg) 111.766 kg 111.131 kg 106.595 kg     Body mass index is 39.77 kg/m.  General:  Well nourished, well developed, in no acute distress HEENT: normal Lymph: no adenopathy Neck: no JVD Endocrine:  No thryomegaly Vascular: No carotid bruits; pedal pulses 1+ bilaterally without bruits  Cardiac:  irerg irreg; soft 4-3/1 systolic murmur no gallup rub or click Lungs:  clear to auscultation bilaterally, no wheezing, rhonchi or rales  Abd: soft, nontender, no hepatomegaly  Ext: no to trace edema Musculoskeletal:  No deformities, BUE and BLE strength normal and equal Skin: warm and dry  Neuro:  CNs 2-12 intact, no focal abnormalities noted Psych:  Normal affect     Relevant CV Studies: No old  Laboratory Data:  High Sensitivity Troponin:   Recent Labs  Lab 06/01/20 1241 06/01/20 1430  TROPONINIHS 16 14     Chemistry Recent Labs  Lab 06/01/20 1241 06/01/20 1704 06/02/20 0450  NA 139 141 139  K 3.0* 3.3* 3.1*  CL 105 107 108  CO2 20* 22 22  GLUCOSE 138* 115* 85  BUN 21 19 17   CREATININE 0.93 0.84 0.62  CALCIUM 9.2 9.2 8.7*  GFRNONAA 60* >60 >60  GFRAA >60 >60 >60  ANIONGAP 14 12 9     Recent Labs  Lab 06/02/20 0450  PROT 6.1*  ALBUMIN 3.3*  AST 28  ALT 21  ALKPHOS 62  BILITOT 0.6   Hematology Recent Labs  Lab 06/01/20 1241 06/02/20 0450  WBC 9.3 9.3  RBC 4.59 4.39  HGB 13.5 12.7  HCT 40.7 39.4  MCV 88.7 89.7  MCH 29.4 28.9   MCHC 33.2 32.2  RDW 15.6* 15.4  PLT 245 219   BNP Recent Labs  Lab 06/01/20 1241  BNP 298.0*    DDimer No results for input(s): DDIMER in the last 168 hours.   Radiology/Studies:  DG Chest 2 View  Result Date: 06/01/2020 CLINICAL DATA:  Chest pain, short of breath EXAM: CHEST - 2 VIEW COMPARISON:  12/28/2010 FINDINGS: Normal mediastinum and cardiac silhouette. Chronic central bronchitic markings. Normal pulmonary vasculature. No effusion, infiltrate, or pneumothorax. IMPRESSION: Chronic bronchitic markings.  No acute findings. Electronically Signed   By: Suzy Bouchard M.D.   On: 06/01/2020 12:50       HEAR Score (for undifferentiated chest pain):     6   Assessment and Plan:   1. Chest pain and Lt jaw pain with episodes for several months would have to stop and rest and other times DOE.   No awareness of rapid HR until yesterday.  Over 25 years ago concern for MI but was never told to follow up with Cards.   troponins stable. Concern chronic angina with escalation.   May all be from a fib but with her age would benefit from ischemic work up.  For echo today  2. New a fib wit RVR now on IV dilt drip and IV heparin with CHA2DS2VASc of 5. Would place on Eliquis once plan for ischemic work up complete-- normal TSH  3. HTN on dilt drip and at home on losartan hctz 100-25 daily. 4. HLD with LDL of 120 and HDL of 51 will add statin.    5. Hypokalemia replaced per IM, Mg+ is good at 1.9  6. + snoring would need sleep study as outpt    For questions or updates, please contact [place] Please  consult [email or web address] for contact info under    Signed, Cecilie Kicks, NP  06/02/2020 7:51 AM  Patient seen and examined with Cecilie Kicks, NP.  Agree as above, with the following exceptions and changes as noted below. Ms. Baltazar is seen today with her daughter Rosalva Ferron present. She is currently in atrial fibrillation with good rate control, but describes episodes of substernal chest  pressure relieved by rest and aspirin in 10-15 minutes, worsened by exertion and working in her garden. Accompanied by radiation to the jaw, and arms. Also has DOE. Prior history of possible abnormal echocardiogram though history unclear. She needs an ischemic workup. In setting typical angina and risk factors of HTN, HLD, obesity, I have discussed LHC with the patient and her daughter. Gen: NAD, CV: RRR, no murmurs, Lungs: clear, Abd: soft, Extrem: Warm, well perfused, mild LE edema, Neuro/Psych: alert and oriented x 3, normal mood and affect. All available labs, radiology testing, previous records reviewed. We participated in shared decision making and determined that the best next step would be coronary angiography. We will continue to use IV diltiazem for rate control. Plan for echocardiogram. IM has also planned for LE dopplers in setting of family history of PE and LE swelling.   INFORMED CONSENT: I have reviewed the risks, indications, and alternatives to cardiac catheterization, possible angioplasty, and stenting with the patient. Risks include but are not limited to bleeding, infection, vascular injury, stroke, myocardial infection, arrhythmia, kidney injury, radiation-related injury in the case of prolonged fluoroscopy use, emergency cardiac surgery, and death. The patient understands the risks of serious complication is 1-2 in 8592 with diagnostic cardiac cath and 1-2% or less with angioplasty/stenting.    Elouise Munroe, MD 06/02/20 11:33 AM

## 2020-06-02 NOTE — Progress Notes (Signed)
ANTICOAGULATION CONSULT NOTE - Follow Up Consult  Pharmacy Consult for Heparin Indication: atrial fibrillation  No Known Allergies  Patient Measurements: Height: 5\' 6"  (167.6 cm) Weight: 111.8 kg (246 lb 6.4 oz) IBW/kg (Calculated) : 59.3 Heparin Dosing Weight:   Vital Signs: Temp: 97.6 F (36.4 C) (06/16 0635) Temp Source: Oral (06/16 0635) BP: 127/68 (06/16 0635) Pulse Rate: 89 (06/16 0635)  Labs: Recent Labs    06/01/20 1241 06/01/20 1430 06/01/20 1704 06/01/20 2257 06/02/20 0450  HGB 13.5  --   --   --  12.7  HCT 40.7  --   --   --  39.4  PLT 245  --   --   --  219  HEPARINUNFRC  --   --   --  0.70  --   CREATININE 0.93  --  0.84  --  0.62  TROPONINIHS 16 14  --   --   --     Estimated Creatinine Clearance: 75.8 mL/min (by C-G formula based on SCr of 0.62 mg/dL).   Medications:  Infusions:  . diltiazem (CARDIZEM) infusion 5 mg/hr (06/01/20 1248)  . heparin 1,200 Units/hr (06/01/20 1452)    Assessment: Patient with heparin level at goal.  No heparin issues noted.  Goal of Therapy:  Heparin level 0.3-0.7 units/ml Monitor platelets by anticoagulation protocol: Yes   Plan:  Continue heparin drip at current rate Recheck heparin level at [address], [place] 06/02/2020,6:49 AM

## 2020-06-02 NOTE — Progress Notes (Signed)
[place] for IV heparin Indication: atrial fibrillation  No Known Allergies  Patient Measurements: Height: 5\' 6"  (167.6 cm) Weight: 111.8 kg (246 lb 6.4 oz) IBW/kg (Calculated) : 59.3 Heparin Dosing Weight: 85 kg  Vital Signs: Temp: 97.6 F (36.4 C) (06/16 0934) Temp Source: Oral (06/16 0635) BP: 130/61 (06/16 0934) Pulse Rate: 88 (06/16 0934)  Labs: Recent Labs    06/01/20 1241 06/01/20 1430 06/01/20 1704 06/01/20 2257 06/02/20 0450 06/02/20 0749  HGB 13.5  --   --   --  12.7  --   HCT 40.7  --   --   --  39.4  --   PLT 245  --   --   --  219  --   HEPARINUNFRC  --   --   --  0.70  --  0.67  CREATININE 0.93  --  0.84  --  0.62  --   TROPONINIHS 16 14  --   --   --   --     Estimated Creatinine Clearance: 75.8 mL/min (by C-G formula based on SCr of 0.62 mg/dL).  Assessment: 22 yoF with PMH HTN, DM2, presents 6/15 with chest/jaw pain and palpitations x 2 days. Found to be in Afib with RVR. Pharmacy to dose IV heparin; Cardiology will see in AM   Baseline INR, aPTT: not done  Prior anticoagulation: none  Today, 06/02/2020:   Heparin level therapeutic at 0.67 on 1200 units/hr  CBC: WNL  No bleeding or infusion issues per nursing  SCr WNL  Goal of Therapy: Heparin level 0.3-0.7 units/ml Monitor platelets by anticoagulation protocol: Yes  Plan:  Continue Heparin 1200 units/hr IV infusion  Daily CBC, daily heparin level once stable  Monitor for signs of bleeding or thrombosis  Per cards plan is Eliquis once ischemic work up is completed  Eudelia Bunch, Pharm.D 06/02/2020 10:02 AM

## 2020-06-02 NOTE — Plan of Care (Signed)
  Problem: Education: Goal: Knowledge of General Education information will improve Description: Including pain rating scale, medication(s)/side effects and non-pharmacologic comfort measures Outcome: Progressing   Problem: Health Behavior/Discharge Planning: Goal: Ability to manage health-related needs will improve Outcome: Progressing   Problem: Clinical Measurements: Goal: Ability to maintain clinical measurements within normal limits will improve Outcome: Progressing Goal: Diagnostic test results will improve Outcome: Progressing Goal: Cardiovascular complication will be avoided Outcome: Progressing   Problem: Activity: Goal: Risk for activity intolerance will decrease Outcome: Progressing   Problem: Nutrition: Goal: Adequate nutrition will be maintained Outcome: Progressing   Problem: Coping: Goal: Level of anxiety will decrease Outcome: Progressing   Problem: Pain Managment: Goal: General experience of comfort will improve Outcome: Progressing   

## 2020-06-02 NOTE — Progress Notes (Signed)
TR BAND REMOVAL  LOCATION:    Radial rt radial  DEFLATED PER PROTOCOL:   yes  TIME BAND OFF / DRESSING APPLIED:    1730/gauze and tegaderm  SITE UPON ARRIVAL:    Level 0  SITE AFTER BAND REMOVAL:    Level 0  CIRCULATION SENSATION AND MOVEMENT:    Within Normal Limits :  Yes, rt hand and fingers warm and pink,sensation present, rt arm resting on pillow. Instructions reviewed w/patient.  COMMENTS:

## 2020-06-02 NOTE — Interval H&P Note (Signed)
Cath Lab Visit (complete for each Cath Lab visit)  Clinical Evaluation Leading to the Procedure:   ACS: Yes.    Non-ACS:    Anginal Classification: CCS IV  Anti-ischemic medical therapy: Minimal Therapy (1 class of medications)  Non-Invasive Test Results: No non-invasive testing performed  Prior CABG: No previous CABG      History and Physical Interval Note:  06/02/2020 3:03 PM  [name]  has presented today for surgery, with the diagnosis of angina.  The various methods of treatment have been discussed with the patient and family. After consideration of risks, benefits and other options for treatment, the patient has consented to  Procedure(s): LEFT HEART CATH AND CORONARY ANGIOGRAPHY (N/A) as a surgical intervention.  [name] history has been reviewed, patient examined, no change in status, stable for surgery.  I have reviewed the patient's chart and labs.  Questions were answered to the patient's satisfaction.     Larae Grooms

## 2020-06-02 NOTE — H&P (View-Only) (Signed)
Cardiology Consultation:   Patient ID: KALEN NEIDERT MRN: [id number]; DOB: 12/18/1941  Admit date: 06/01/2020 Date of Consult: 06/02/2020  Primary Care Provider: Glenis Smoker, MD [facility] Cardiologist: No primary care provider on file. New- Dr. [facility] Electrophysiologist:  None    Patient Profile:   [name] [name] is a 79 y.o. female with a hx of HTN, HLD, DM-2, no cardiac hx except palpitations in past and remote echo who is being seen today for the evaluation of chest pain and atrial fib at the request of Dr. Posey Pronto.  History of Present Illness:   [name] [name] with above hx and only cardiac hx of palpitations and echo remotely, presented to ER yesterday with chest pain and jaw pain.  Discomfort in jaw began 6/13 and on the 14th chest pain.   She would take a couple of asprin with each episode and had 4-5 episodes on Monday and no awareness of heart beat.   On the 15th she had heart racing along with chest discomfort and came to ER.  No edema, no colds or fevers.  She has been having jaw and chest pressure for some time with rest and exertion but with working in garden she has to stop due to SOB and discomfort, with rest it improves.  Remote cardiologist 30 yrs ago with possible MI with gallbladder problem.  Was never told to come back.     In ER she was given IV dilt bolus and drip and IV heparin.  Her K+ was 3.0 Cr 0.93 Mg+ 1.8  BNP 298  Hs Troponin 16 and 154 Hgb 12.7 WBC 9.3 and plts 219 TSH 1.720 2V CXR Chronic bronchitic markings.  No acute findings.   EKG:  The EKG was personally reviewed and demonstrates:  06/01/20 A fib RVR 126 non specific repol abnormalities no old EKG to compare.   Telemetry:  Telemetry was personally reviewed and demonstrates:  A fib mostly rate controlled at 0200 HR up to 145   + Hx of lower ext edema despite decreasing salt intake.  No prior awareness of heart beat.  Racing began yesterday AM when she woke.   No hx of CVA, or  bleeding- except for bright blood occ. With BM just a smear.   Past Medical History:  Diagnosis Date  . Anemia   . Arthritis   . Diabetes mellitus without complication ([place])   . Hypertension     Past Surgical History:  Procedure Laterality Date  . ABDOMINAL HYSTERECTOMY    . CHOLECYSTECTOMY       Home Medications:  Prior to Admission medications   Medication Sig Start Date End Date Taking? Authorizing Provider  acetaminophen (TYLENOL) 500 MG tablet Take 500-1,000 mg by mouth every 6 (six) hours as needed (shoulder pain).   Yes [provider]  aspirin 81 MG tablet Take 162 mg by mouth 3 (three) times daily as needed (chest pain or tightness).    Yes [provider]  CALCIUM PO Take 1 tablet by mouth See admin instructions. Take one tablet daily, take an additional one tablet every three days.   Yes [provider]  Lidocaine (BLUE-EMU PAIN RELIEF DRY EX) Apply 1 application topically in the morning and at bedtime. Both shoulders and both knees   Yes [provider]  losartan-hydrochlorothiazide (HYZAAR) 100-25 MG tablet TAKE 1 TABLET BY MOUTH EVERY DAY Patient taking differently: Take 1 tablet by mouth daily.  03/19/17  Yes Tereasa Coop, PA-C  Multiple Vitamins-Minerals (ONE-A-DAY 50 PLUS PO) Take 1 tablet by mouth daily.    Yes [provider]  Multiple Vitamins-Minerals (PRESERVISION AREDS 2 PO) Take 1 capsule by mouth in the morning and at bedtime.   Yes [provider]    Inpatient Medications: Scheduled Meds: . aspirin EC  81 mg Oral Daily   Continuous Infusions: . diltiazem (CARDIZEM) infusion 5 mg/hr (06/02/20 0653)  . heparin 1,200 Units/hr (06/01/20 1452)   PRN Meds: acetaminophen **OR** acetaminophen, oxyCODONE, polyethylene glycol, traZODone  Allergies:   No Known Allergies  Social History:   Social History   Socioeconomic History  . Marital status: Married    Spouse name: Not on file  . Number of  children: Not on file  . Years of education: Not on file  . Highest education level: Not on file  Occupational History  . Not on file  Tobacco Use  . Smoking status: Never Smoker  . Smokeless tobacco: Never Used  Substance and Sexual Activity  . Alcohol use: Not on file  . Drug use: Not on file  . Sexual activity: Not Currently  Other Topics Concern  . Not on file  Social History Narrative  . Not on file   Social Determinants of Health   Financial Resource Strain:   . Difficulty of Paying Living Expenses:   Food Insecurity:   . Worried About Charity fundraiser in the Last Year:   . Arboriculturist in the Last Year:   Transportation Needs:   . Film/video editor (Medical):   [name] Lack of Transportation (Non-Medical):   Physical Activity:   . Days of Exercise per Week:   . Minutes of Exercise per Session:   Stress:   . Feeling of Stress :   Social Connections:   . Frequency of Communication with Friends and Family:   . Frequency of Social Gatherings with Friends and Family:   . Attends Religious Services:   . Active Member of Clubs or Organizations:   . Attends Archivist Meetings:   [name] Marital Status:   Intimate Partner Violence:   . Fear of Current or Ex-Partner:   . Emotionally Abused:   [name] Physically Abused:   . Sexually Abused:     Family History:    Family History  Problem Relation Age of Onset  . Hypertension Mother   . Hypertension Father   . Diabetes Paternal Grandmother      ROS:  Please see the history of present illness.  General:no colds or fevers, no weight changes Skin:no rashes or ulcers HEENT:no blurred vision, no congestion CV:see HPI PUL:see HPI GI:no diarrhea constipation or melena, no indigestion GU:no hematuria, no dysuria MS:no joint pain, no claudication Neuro:no syncope, no lightheadedness Endo:no diabetes, no thyroid disease  All other ROS reviewed and negative.     Physical Exam/Data:   Vitals:   06/01/20 1652  06/01/20 2158 06/02/20 0215 06/02/20 0635  BP: 116/87 139/78 118/64 127/68  Pulse: 86 92 84 89  Resp: 18 16 18    Temp: 97.7 F (36.5 C) 97.8 F (36.6 C) 97.7 F (36.5 C) 97.6 F (36.4 C)  TempSrc: Oral Oral Oral Oral  SpO2: 97% 99% 96% 98%  Weight:      Height:        Intake/Output Summary (Last 24 hours) at 06/02/2020 0751 Last data filed at 06/02/2020 0600 Gross per 24 hour  Intake 664.96 ml  Output --  Net 664.96 ml   Last  3 Weights 06/01/2020 06/01/2020 03/24/2016  Weight (lbs) 246 lb 6.4 oz 245 lb 235 lb  Weight (kg) 111.766 kg 111.131 kg 106.595 kg     Body mass index is 39.77 kg/m.  General:  Well nourished, well developed, in no acute distress HEENT: normal Lymph: no adenopathy Neck: no JVD Endocrine:  No thryomegaly Vascular: No carotid bruits; pedal pulses 1+ bilaterally without bruits  Cardiac:  irerg irreg; soft 7-6/1 systolic murmur no gallup rub or click Lungs:  clear to auscultation bilaterally, no wheezing, rhonchi or rales  Abd: soft, nontender, no hepatomegaly  Ext: no to trace edema Musculoskeletal:  No deformities, BUE and BLE strength normal and equal Skin: warm and dry  Neuro:  CNs 2-12 intact, no focal abnormalities noted Psych:  Normal affect     Relevant CV Studies: No old  Laboratory Data:  High Sensitivity Troponin:   Recent Labs  Lab 06/01/20 1241 06/01/20 1430  TROPONINIHS 16 14     Chemistry Recent Labs  Lab 06/01/20 1241 06/01/20 1704 06/02/20 0450  NA 139 141 139  K 3.0* 3.3* 3.1*  CL 105 107 108  CO2 20* 22 22  GLUCOSE 138* 115* 85  BUN 21 19 17   CREATININE 0.93 0.84 0.62  CALCIUM 9.2 9.2 8.7*  GFRNONAA 60* >60 >60  GFRAA >60 >60 >60  ANIONGAP 14 12 9     Recent Labs  Lab 06/02/20 0450  PROT 6.1*  ALBUMIN 3.3*  AST 28  ALT 21  ALKPHOS 62  BILITOT 0.6   Hematology Recent Labs  Lab 06/01/20 1241 06/02/20 0450  WBC 9.3 9.3  RBC 4.59 4.39  HGB 13.5 12.7  HCT 40.7 39.4  MCV 88.7 89.7  MCH 29.4 28.9   MCHC 33.2 32.2  RDW 15.6* 15.4  PLT 245 219   BNP Recent Labs  Lab 06/01/20 1241  BNP 298.0*    DDimer No results for input(s): DDIMER in the last 168 hours.   Radiology/Studies:  DG Chest 2 View  Result Date: 06/01/2020 CLINICAL DATA:  Chest pain, short of breath EXAM: CHEST - 2 VIEW COMPARISON:  12/28/2010 FINDINGS: Normal mediastinum and cardiac silhouette. Chronic central bronchitic markings. Normal pulmonary vasculature. No effusion, infiltrate, or pneumothorax. IMPRESSION: Chronic bronchitic markings.  No acute findings. Electronically Signed   By: Suzy Bouchard M.D.   On: 06/01/2020 12:50       HEAR Score (for undifferentiated chest pain):     6   Assessment and Plan:   1. Chest pain and Lt jaw pain with episodes for several months would have to stop and rest and other times DOE.   No awareness of rapid HR until yesterday.  Over 25 years ago concern for MI but was never told to follow up with Cards.   troponins stable. Concern chronic angina with escalation.   May all be from a fib but with her age would benefit from ischemic work up.  For echo today  2. New a fib wit RVR now on IV dilt drip and IV heparin with CHA2DS2VASc of 5. Would place on Eliquis once plan for ischemic work up complete-- normal TSH  3. HTN on dilt drip and at home on losartan hctz 100-25 daily. 4. HLD with LDL of 120 and HDL of 51 will add statin.    5. Hypokalemia replaced per IM, Mg+ is good at 1.9  6. + snoring would need sleep study as outpt    For questions or updates, please contact [place] Please  consult [email or web address] for contact info under    Signed, Cecilie Kicks, NP  06/02/2020 7:51 AM  Patient seen and examined with Cecilie Kicks, NP.  Agree as above, with the following exceptions and changes as noted below. [name] [name] is seen today with her daughter Rosalva Ferron present. She is currently in atrial fibrillation with good rate control, but describes episodes of substernal chest  pressure relieved by rest and aspirin in 10-15 minutes, worsened by exertion and working in her garden. Accompanied by radiation to the jaw, and arms. Also has DOE. Prior history of possible abnormal echocardiogram though history unclear. She needs an ischemic workup. In setting typical angina and risk factors of HTN, HLD, obesity, I have discussed LHC with the patient and her daughter. Gen: NAD, CV: RRR, no murmurs, Lungs: clear, Abd: soft, Extrem: Warm, well perfused, mild LE edema, Neuro/Psych: alert and oriented x 3, normal mood and affect. All available labs, radiology testing, previous records reviewed. We participated in shared decision making and determined that the best next step would be coronary angiography. We will continue to use IV diltiazem for rate control. Plan for echocardiogram. IM has also planned for LE dopplers in setting of family history of PE and LE swelling.   INFORMED CONSENT: I have reviewed the risks, indications, and alternatives to cardiac catheterization, possible angioplasty, and stenting with the patient. Risks include but are not limited to bleeding, infection, vascular injury, stroke, myocardial infection, arrhythmia, kidney injury, radiation-related injury in the case of prolonged fluoroscopy use, emergency cardiac surgery, and death. The patient understands the risks of serious complication is 1-2 in 1194 with diagnostic cardiac cath and 1-2% or less with angioplasty/stenting.    Elouise Munroe, MD 06/02/20 11:33 AM

## 2020-06-02 NOTE — Progress Notes (Signed)
No obstructive CAD on cath per report.   Recommend continued management of atrial fibrillation. Will obtain echocardiogram tomorrow and assess further.  Cardiology will follow.

## 2020-06-02 NOTE — Progress Notes (Signed)
[facility] Progress Note  Patient: [name] [name]    ZOX:[id number]  DOA: 06/01/2020     Date of Service: the patient was seen and examined on 06/02/2020  Chief Complaint  Patient presents with  . Chest Pain   Brief hospital course: [name] [name] is a 79 y.o. WF PMHx HLD, HTN, DM type II (diet-controlled),  Presenting to the ED with a chief complaint of chest pain. On 05/30/2020 started having left-sided jaw pain. Woke up yesterday with chest pain which she states was intermittent. Pain is worse with exerting herself, even walking around at home. This morning woke up with continued chest discomfort as well as palpitations. She feels as though her heart is racing. She was told in the past that her "heart skips a beat sometimes." She was never put on any medications for this. She had an echo done greater than 10 years ago. She does not regularly follow-up with a cardiologist. She denies any unilateral leg swelling, does note that both of her legs do get swollen for time to time which improved with elevation. She denies any hemoptysis, anticoagulant use, injuries or falls, vomiting, diarrhea, abdominal pain, fever. No history of DVT, PE, MI or thyroid issues.  Currently plan is monitor closely for A. fib treatment.  Assessment and Plan: 1.  New onset A. fib. Currently on IV heparin. Cardiology consulted. Also on IV Cardizem drip. Monitor on telemetry.  2.  Essential hypertension Blood pressure stable.  Monitor.  3.  Abnormal EKG Normal coronaries. Echocardiogram currently pending. No further work-up.  4.  Hypokalemia Corrected. Monitor.  5.  Pedal edema Check lower extremity Doppler  6.  Obesity Body mass index is 39.77 kg/m.   Diet: Cardiac and carb liquid diet DVT Prophylaxis: Subcutaneous Heparin    Advance goals of care discussion: Full code  Family Communication: family was present at bedside, at the time of interview.  The pt provided permission to  discuss medical plan with the family. Opportunity was given to ask question and all questions were answered satisfactorily.   Disposition:  Status is: Inpatient  Remains inpatient appropriate because:IV treatments appropriate due to intensity of illness or inability to take PO   Dispo: The patient is from: Home              Anticipated d/c is to: Home              Anticipated d/c date is: 1 day              Patient currently is not medically stable to d/c.        Subjective: No nausea no vomiting but no fever no chills.  No chest pain abdominal pain.  Physical Exam:  General: Appear in mild distress, no Rash; Oral Mucosa Clear, moist. no Abnormal Neck Mass Or lumps, Conjunctiva normal  Cardiovascular: S1 and S2 Present, no Murmur, Respiratory: good respiratory effort, Bilateral Air entry present and Clear to Auscultation, no Crackles, no wheezes Abdomen: Bowel Sound present, Soft and no tenderness Extremities: bilateral  Pedal edema, no calf tenderness Neurology: alert and oriented to time, place, and person affect appropriate. no new focal deficit Gait not checked due to patient safety concerns  Vitals:   06/02/20 1655 06/02/20 1710 06/02/20 1725 06/02/20 1830  BP: (!) 126/43 (!) 119/53 (!) 122/48 127/68  Pulse: 83 88 85 85  Resp: 12 11 19 18   Temp:    98 F (36.7 C)  TempSrc:    Oral  SpO2: 96% 96% 96% 97%  Weight:      Height:        Intake/Output Summary (Last 24 hours) at 06/02/2020 1937 Last data filed at 06/02/2020 1300 Gross per 24 hour  Intake 801 ml  Output -  Net 801 ml   Filed Weights   06/01/20 1424 06/01/20 1647  Weight: 111.1 kg 111.8 kg    Data Reviewed: I have personally reviewed and interpreted daily labs, tele strips, imagings as discussed above. I reviewed all nursing notes, pharmacy notes, vitals, pertinent old records I have discussed plan of care as described above with RN and patient/family.  CBC: Recent Labs  Lab 06/01/20 1241  06/02/20 0450  WBC 9.3 9.3  NEUTROABS  --  5.7  HGB 13.5 12.7  HCT 40.7 39.4  MCV 88.7 89.7  PLT 245 009   Basic Metabolic Panel: Recent Labs  Lab 06/01/20 1241 06/01/20 1704 06/02/20 0450  NA 139 141 139  K 3.0* 3.3* 3.1*  CL 105 107 108  CO2 20* 22 22  GLUCOSE 138* 115* 85  BUN 21 19 17   CREATININE 0.93 0.84 0.62  CALCIUM 9.2 9.2 8.7*  MG 1.8 2.0 1.9  PHOS  --  3.7 2.9    Studies: CARDIAC CATHETERIZATION  Result Date: 06/02/2020  The left ventricular systolic function is normal.  LV end diastolic pressure is normal.  The left ventricular ejection fraction is 50-55% by visual estimate.  There is no aortic valve stenosis.  Minimal, nobstructive CAD. Tortuous cororany arteries.  OK to restart heparin 8 hours post sheath pull. Continue preventive therapy.    Scheduled Meds: . aspirin EC  81 mg Oral Daily  . atorvastatin  20 mg Oral Daily  . sodium chloride flush  3 mL Intravenous Q12H   Continuous Infusions: . diltiazem (CARDIZEM) infusion 5 mg/hr (06/02/20 0653)  . heparin Stopped (06/02/20 1541)   PRN Meds: acetaminophen **OR** acetaminophen, ondansetron (ZOFRAN) IV, oxyCODONE, polyethylene glycol, traZODone  Time spent: 35 minutes  Author: Berle Mull, MD [facility] 06/02/2020 7:37 PM  To reach On-call, see care teams to locate the attending and reach out via [email or web address]. Between 7PM-7AM, please contact night-coverage If you still have difficulty reaching the attending provider, please page the [facility] (Director on Call) for [facility] on amion for assistance.

## 2020-06-03 ENCOUNTER — Encounter (HOSPITAL_COMMUNITY): Payer: Self-pay | Admitting: Interventional Cardiology

## 2020-06-03 ENCOUNTER — Inpatient Hospital Stay (HOSPITAL_COMMUNITY): Payer: Medicare Other

## 2020-06-03 DIAGNOSIS — I350 Nonrheumatic aortic (valve) stenosis: Secondary | ICD-10-CM

## 2020-06-03 DIAGNOSIS — R609 Edema, unspecified: Secondary | ICD-10-CM

## 2020-06-03 LAB — ECHOCARDIOGRAM COMPLETE
Height: 66 in
Weight: 3942.4 oz

## 2020-06-03 LAB — BASIC METABOLIC PANEL
Anion gap: 9 (ref 5–15)
BUN: 14 mg/dL (ref 8–23)
CO2: 24 mmol/L (ref 22–32)
Calcium: 8.7 mg/dL — ABNORMAL LOW (ref 8.9–10.3)
Chloride: 108 mmol/L (ref 98–111)
Creatinine, Ser: 0.66 mg/dL (ref 0.44–1.00)
GFR calc Af Amer: >60 mL/min (ref >60)
GFR calc non Af Amer: >60 mL/min (ref >60)
Glucose, Bld: 135 mg/dL — ABNORMAL HIGH (ref 70–99)
Potassium: 3.6 mmol/L (ref 3.5–5.1)
Sodium: 141 mmol/L (ref 135–145)

## 2020-06-03 LAB — HEMOGLOBIN A1C
Hgb A1c MFr Bld: 6.1 % — ABNORMAL HIGH (ref 4.8–5.6)
Mean Plasma Glucose: 128 mg/dL

## 2020-06-03 LAB — HEPARIN LEVEL (UNFRACTIONATED): Heparin Unfractionated: 0.25 IU/mL — ABNORMAL LOW (ref 0.30–0.70)

## 2020-06-03 LAB — GLUCOSE, CAPILLARY: Glucose-Capillary: 148 mg/dL — ABNORMAL HIGH (ref 70–99)

## 2020-06-03 MED ORDER — HYDROCORTISONE 0.5 % EX CREA
TOPICAL_CREAM | Freq: Three times a day (TID) | CUTANEOUS | Status: DC
  Administered 2020-06-04: 1 via TOPICAL
  Filled 2020-06-03: qty 28.35

## 2020-06-03 MED ORDER — APIXABAN 5 MG PO TABS
5.0000 mg | ORAL_TABLET | Freq: Two times a day (BID) | ORAL | Status: DC
  Administered 2020-06-03 – 2020-06-04 (×2): 5 mg via ORAL
  Filled 2020-06-03 (×2): qty 1

## 2020-06-03 MED ORDER — SODIUM CHLORIDE 0.9% FLUSH: 3.0000 mL | INTRAVENOUS | Status: DC | PRN

## 2020-06-03 MED ORDER — HEPARIN (PORCINE) 25000 UT/250ML-% IV SOLN
1200.0000 [IU]/h | INTRAVENOUS | Status: DC
  Administered 2020-06-03: 1200 [IU]/h via INTRAVENOUS

## 2020-06-03 MED ORDER — SODIUM CHLORIDE 0.9% FLUSH: 3.0000 mL | Freq: Two times a day (BID) | INTRAVENOUS | Status: DC

## 2020-06-03 MED ORDER — POTASSIUM CHLORIDE CRYS ER 20 MEQ PO TBCR
40.0000 meq | EXTENDED_RELEASE_TABLET | Freq: Once | ORAL | Status: AC
  Administered 2020-06-03: 40 meq via ORAL
  Filled 2020-06-03: qty 2

## 2020-06-03 MED ORDER — HYDRALAZINE HCL 20 MG/ML IJ SOLN: 10.0000 mg | INTRAMUSCULAR | Status: DC | PRN

## 2020-06-03 MED ORDER — HEPARIN (PORCINE) 25000 UT/250ML-% IV SOLN: 1350.0000 [IU]/h | INTRAVENOUS | Status: DC

## 2020-06-03 MED ORDER — DILTIAZEM HCL ER COATED BEADS 120 MG PO CP24
120.0000 mg | ORAL_CAPSULE | Freq: Every day | ORAL | Status: DC
  Administered 2020-06-03 – 2020-06-04 (×2): 120 mg via ORAL
  Filled 2020-06-03 (×2): qty 1

## 2020-06-03 MED ORDER — ACETAMINOPHEN 325 MG PO TABS
650.0000 mg | ORAL_TABLET | ORAL | Status: DC | PRN
  Administered 2020-06-04: 650 mg via ORAL

## 2020-06-03 MED ORDER — LABETALOL HCL 5 MG/ML IV SOLN: 10.0000 mg | INTRAVENOUS | Status: DC | PRN

## 2020-06-03 MED ORDER — SODIUM CHLORIDE 0.9 % IV SOLN: 250.0000 mL | INTRAVENOUS | Status: DC | PRN

## 2020-06-03 NOTE — Progress Notes (Signed)
[place] for IV heparin Indication: atrial fibrillation  No Known Allergies  Patient Measurements: Height: 5\' 6"  (167.6 cm) Weight: 111.8 kg (246 lb 6.4 oz) IBW/kg (Calculated) : 59.3 Heparin Dosing Weight: 85.4 kg  Vital Signs: Temp: 97.9 F (36.6 C) (06/17 0600) Temp Source: Oral (06/17 0600) BP: 119/72 (06/17 0600) Pulse Rate: 82 (06/17 0600)  Labs: Recent Labs    06/01/20 1241 06/01/20 1430 06/01/20 1704 06/01/20 2257 06/02/20 0450 06/02/20 0749 06/03/20 1013  HGB 13.5  --   --   --  12.7  --   --   HCT 40.7  --   --   --  39.4  --   --   PLT 245  --   --   --  219  --   --   HEPARINUNFRC  --   --   --  0.70  --  0.67 0.25*  CREATININE 0.93  --  0.84  --  0.62  --   --   TROPONINIHS 16 14  --   --   --   --   --     Estimated Creatinine Clearance: 75.8 mL/min (by C-G formula based on SCr of 0.62 mg/dL).  Assessment: 23 yoF with PMH of HTN, DM2, presents 6/15 with chest/jaw pain and palpitations x 2 days. Found to be in a-fib with RVR. Pharmacy consulted to dose IV heparin. Cardiac cath on 6/16: no obstructive CAD. Per MD orders, heparin to resume 8 hours post sheath pull.   Today, 06/03/2020:   AM heparin level = 0.25 units/mL, now slightly subtherapeutic  Confirmed with RN that heparin resumed at 0211 as documented on [facility] and was not stopped for any reason. No issues with IV site per RN.   No bleeding issues noted per nursing  CBC WNL 6/16 (CBC not checked today despite orders for daily CBC-RN to follow up with lab)  Goal of Therapy: Heparin level 0.3-0.7 units/ml Monitor platelets by anticoagulation protocol: Yes  Plan:  Increase heparin infusion to 1350 units/hr   Heparin level 8 hours after rate change  Daily CBC, heparin level   Monitor for signs of bleeding or thrombosis  Follow up results of lower extremity Doppler  Follow up for transition to Apixaban per Cardiology recs   Lindell Spar, PharmD,  BCPS Clinical Pharmacist  06/03/2020 11:34 AM

## 2020-06-03 NOTE — Progress Notes (Signed)
Progress Note  Patient Name: [name] [name] Date of Encounter: 06/03/2020  [facility] Cardiologist: Elouise Munroe, MD   Subjective   Feeling well, sitting up in bedside chair with daughter and husband present.   Inpatient Medications    Scheduled Meds: . apixaban  5 mg Oral BID  . atorvastatin  20 mg Oral Daily  . diltiazem  120 mg Oral Daily  . hydrocortisone cream   Topical TID  . sodium chloride flush  3 mL Intravenous Q12H  . sodium chloride flush  3 mL Intravenous Q12H   Continuous Infusions: . sodium chloride     PRN Meds: sodium chloride, acetaminophen **OR** acetaminophen, acetaminophen, ondansetron (ZOFRAN) IV, oxyCODONE, polyethylene glycol, sodium chloride flush, traZODone   Vital Signs    Vitals:   06/02/20 2027 06/03/20 0600 06/03/20 1259 06/03/20 2037  BP: (!) 118/56 119/72 (!) 147/85 131/73  Pulse: 83 82 84 75  Resp: 20 16 16 18   Temp: (!) 97.4 F (36.3 C) 97.9 F (36.6 C) 97.8 F (36.6 C) 97.6 F (36.4 C)  TempSrc: Oral Oral Oral Oral  SpO2: 99% 96% 99% 98%  Weight:      Height:        Intake/Output Summary (Last 24 hours) at 06/03/2020 2219 Last data filed at 06/03/2020 1900 Gross per 24 hour  Intake 1121.79 ml  Output --  Net 1121.79 ml   Last 3 Weights 06/01/2020 06/01/2020 03/24/2016  Weight (lbs) 246 lb 6.4 oz 245 lb 235 lb  Weight (kg) 111.766 kg 111.131 kg 106.595 kg      Telemetry    Afib, rate controlled - Personally Reviewed  ECG    No new - Personally Reviewed  Physical Exam   GEN: No acute distress.   Neck: No JVD Cardiac: iRRR, no murmurs, rubs, or gallops.  Respiratory: Clear to auscultation bilaterally. GI: Soft, nontender, non-distended  MS: No edema; No deformity. Neuro:  Nonfocal  Psych: Normal affect   Labs    High Sensitivity Troponin:   Recent Labs  Lab 06/01/20 1241 06/01/20 1430  TROPONINIHS 16 14      Chemistry Recent Labs  Lab 06/01/20 1704 06/02/20 0450 06/03/20 1540  NA 141 139 141   K 3.3* 3.1* 3.6  CL 107 108 108  CO2 22 22 24   GLUCOSE 115* 85 135*  BUN 19 17 14   CREATININE 0.84 0.62 0.66  CALCIUM 9.2 8.7* 8.7*  PROT  --  6.1*  --   ALBUMIN  --  3.3*  --   AST  --  28  --   ALT  --  21  --   ALKPHOS  --  62  --   BILITOT  --  0.6  --   GFRNONAA >60 >60 >60  GFRAA >60 >60 >60  ANIONGAP 12 9 9      Hematology Recent Labs  Lab 06/01/20 1241 06/02/20 0450  WBC 9.3 9.3  RBC 4.59 4.39  HGB 13.5 12.7  HCT 40.7 39.4  MCV 88.7 89.7  MCH 29.4 28.9  MCHC 33.2 32.2  RDW 15.6* 15.4  PLT 245 219    BNP Recent Labs  Lab 06/01/20 1241  BNP 298.0*     DDimer No results for input(s): DDIMER in the last 168 hours.   Radiology    CARDIAC CATHETERIZATION  Result Date: 06/02/2020  The left ventricular systolic function is normal.  LV end diastolic pressure is normal.  The left ventricular ejection fraction is 50-55% by visual  estimate.  There is no aortic valve stenosis.  Minimal, nobstructive CAD. Tortuous cororany arteries.  OK to restart heparin 8 hours post sheath pull. Continue preventive therapy.   ECHOCARDIOGRAM COMPLETE  Result Date: 06/03/2020    ECHOCARDIOGRAM REPORT   Patient Name:   [name] [name] Date of Exam: 06/03/2020 Medical Rec #:  [id number]   Height:       66.0 in Accession #:    [id number]  Weight:       246.4 lb Date of Birth:  1941/11/27   BSA:          2.185 m Patient Age:    79 years    BP:           119/72 mmHg Patient Gender: F           HR:           75 bpm. Exam Location:  Inpatient Procedure: 2D Echo Indications:    New Onset Atrial Fibrillation  History:        Patient has no prior history of Echocardiogram examinations.                 Risk Factors:Hypertension and Diabetes.  Sonographer:    Mikki Santee RDCS (AE) Referring Phys: [place]  1. Left ventricular ejection fraction, by estimation, is 55 to 60%. The left ventricle has normal function. The left ventricle has no regional wall motion  abnormalities. There is mild left ventricular hypertrophy. Left ventricular diastolic parameters are indeterminate.  2. Right ventricular systolic function is mildly reduced. The right ventricular size is normal. There is normal pulmonary artery systolic pressure. The estimated right ventricular systolic pressure is 41.6 mmHg.  3. Left atrial size was mildly dilated.  4. The mitral valve is abnormal. Trivial mitral valve regurgitation. No evidence of mitral stenosis.  5. The aortic valve is abnormal. Aortic valve regurgitation is trivial. Moderate aortic valve stenosis. Aortic valve mean gradient measures 20.0 mmHg.  6. The inferior vena cava is normal in size with greater than 50% respiratory variability, suggesting right atrial pressure of 3 mmHg. FINDINGS  Left Ventricle: Left ventricular ejection fraction, by estimation, is 55 to 60%. The left ventricle has normal function. The left ventricle has no regional wall motion abnormalities. The left ventricular internal cavity size was normal in size. There is  mild left ventricular hypertrophy. Left ventricular diastolic parameters are indeterminate. Right Ventricle: The right ventricular size is normal. No increase in right ventricular wall thickness. Right ventricular systolic function is mildly reduced. There is normal pulmonary artery systolic pressure. The tricuspid regurgitant velocity is 2.05 m/s, and with an assumed right atrial pressure of 3 mmHg, the estimated right ventricular systolic pressure is 60.6 mmHg. Left Atrium: Left atrial size was mildly dilated. Right Atrium: Right atrial size was normal in size. Pericardium: There is no evidence of pericardial effusion. Mitral Valve: The mitral valve is abnormal. Normal mobility of the mitral valve leaflets. Moderate mitral annular calcification. Trivial mitral valve regurgitation. No evidence of mitral valve stenosis. Tricuspid Valve: The tricuspid valve is normal in structure. Tricuspid valve regurgitation  is trivial. No evidence of tricuspid stenosis. Aortic Valve: The aortic valve is abnormal. Aortic valve regurgitation is trivial. Moderate aortic stenosis is present. There is mild calcification of the aortic valve. Aortic valve mean gradient measures 20.0 mmHg. Aortic valve peak gradient measures 33.1 mmHg. Aortic valve area, by VTI measures 0.90 cm. Pulmonic Valve: The pulmonic valve was normal in structure. Pulmonic  valve regurgitation is not visualized. No evidence of pulmonic stenosis. Aorta: The aortic root is normal in size and structure. Venous: The inferior vena cava is normal in size with greater than 50% respiratory variability, suggesting right atrial pressure of 3 mmHg. IAS/Shunts: No atrial level shunt detected by color flow Doppler.  LEFT VENTRICLE PLAX 2D LVIDd:         4.30 cm LVIDs:         2.70 cm LV PW:         1.10 cm LV IVS:        1.00 cm LVOT diam:     2.00 cm LV SV:         46 LV SV Index:   21 LVOT Area:     3.14 cm  RIGHT VENTRICLE RV S prime:     13.70 cm/s TAPSE (M-mode): 1.5 cm LEFT ATRIUM             Index       RIGHT ATRIUM           Index LA diam:        3.30 cm 1.51 cm/m  RA Area:     12.80 cm LA Vol (A2C):   63.4 ml 29.01 ml/m RA Volume:   28.30 ml  12.95 ml/m LA Vol (A4C):   57.9 ml 26.50 ml/m LA Biplane Vol: 64.0 ml 29.29 ml/m  AORTIC VALVE AV Area (Vmax):    0.86 cm AV Area (Vmean):   0.89 cm AV Area (VTI):     0.90 cm AV Vmax:           287.67 cm/s AV Vmean:          186.000 cm/s AV VTI:            0.513 m AV Peak Grad:      33.1 mmHg AV Mean Grad:      20.0 mmHg LVOT Vmax:         79.07 cm/s LVOT Vmean:        52.867 cm/s LVOT VTI:          0.147 m LVOT/AV VTI ratio: 0.29  AORTA Ao Root diam: 3.20 cm TRICUSPID VALVE TR Peak grad:   16.8 mmHg TR Vmax:        205.00 cm/s  SHUNTS Systemic VTI:  0.15 m Systemic Diam: 2.00 cm Cherlynn Kaiser MD Electronically signed by Cherlynn Kaiser MD Signature Date/Time: 06/03/2020/1:12:32 PM    Final    VAS [place] LOWER EXTREMITY  VENOUS (DVT)  Result Date: 06/03/2020  Lower Venous DVTStudy Indications: Edema.  Comparison Study: No prior study on file for comparison Performing Technologist: Sharion Dove RVS  Examination Guidelines: A complete evaluation includes B-mode imaging, spectral Doppler, color Doppler, and power Doppler as needed of all accessible portions of each vessel. Bilateral testing is considered an integral part of a complete examination. Limited examinations for reoccurring indications may be performed as noted. The reflux portion of the exam is performed with the patient in reverse Trendelenburg.  +---------+---------------+---------+-----------+----------+--------------+ RIGHT    CompressibilityPhasicitySpontaneityPropertiesThrombus Aging +---------+---------------+---------+-----------+----------+--------------+ CFV      Full           Yes      Yes                                 +---------+---------------+---------+-----------+----------+--------------+ SFJ      Full                                                        +---------+---------------+---------+-----------+----------+--------------+  FV Prox  Full                                                        +---------+---------------+---------+-----------+----------+--------------+ FV Mid   Full                                                        +---------+---------------+---------+-----------+----------+--------------+ FV DistalFull                                                        +---------+---------------+---------+-----------+----------+--------------+ PFV      Full                                                        +---------+---------------+---------+-----------+----------+--------------+ POP      Full           Yes      Yes                                 +---------+---------------+---------+-----------+----------+--------------+ PTV      Full                                                         +---------+---------------+---------+-----------+----------+--------------+ PERO     Full                                                        +---------+---------------+---------+-----------+----------+--------------+   +---------+---------------+---------+-----------+----------+--------------+ LEFT     CompressibilityPhasicitySpontaneityPropertiesThrombus Aging +---------+---------------+---------+-----------+----------+--------------+ CFV      Full           Yes      Yes                                 +---------+---------------+---------+-----------+----------+--------------+ SFJ      Full                                                        +---------+---------------+---------+-----------+----------+--------------+ FV Prox  Full                                                        +---------+---------------+---------+-----------+----------+--------------+  FV Mid   Full                                                        +---------+---------------+---------+-----------+----------+--------------+ FV DistalFull                                                        +---------+---------------+---------+-----------+----------+--------------+ PFV      Full                                                        +---------+---------------+---------+-----------+----------+--------------+ POP      Full           Yes      Yes                                 +---------+---------------+---------+-----------+----------+--------------+ PTV      Full                                                        +---------+---------------+---------+-----------+----------+--------------+ PERO     Full                                                        +---------+---------------+---------+-----------+----------+--------------+     Summary: RIGHT: - There is no evidence of deep vein thrombosis in the lower extremity.  LEFT: - There  is no evidence of deep vein thrombosis in the lower extremity.  *See table(s) above for measurements and observations. Electronically signed by Monica Martinez MD on 06/03/2020 at 3:19:28 PM.    Final     Cardiac Studies   Echo pending  Patient Profile      Assessment & Plan    1. Persistent AF - on IV heparin and IV diltiazem, transition to oral today.  2. Chest pain - no CAD on cath yesterday, echo results pending at time of exam. 3. HTN - continue losartan HCTZ 4. HLD - add statin 5. Snoring - needs outpt sleep study.   Discussed cath results and subsequently called into patient's room to discuss echo results. >35 minutes spend on today's encounter.      For questions or updates, please contact [place] Please consult [email or web address] for contact info under        Signed, Elouise Munroe, MD  06/03/2020,

## 2020-06-03 NOTE — Progress Notes (Signed)
ANTICOAGULATION CONSULT NOTE - Initial Consult  Pharmacy Consult for apixaban Indication: atrial fibrillation  No Known Allergies  Patient Measurements: Height: 5\' 6"  (167.6 cm) Weight: 111.8 kg (246 lb 6.4 oz) IBW/kg (Calculated) : 59.3  Vital Signs: Temp: 97.8 F (36.6 C) (06/17 1259) Temp Source: Oral (06/17 1259) BP: 147/85 (06/17 1259) Pulse Rate: 84 (06/17 1259)  Labs: Recent Labs    06/01/20 1241 06/01/20 1430 06/01/20 1704 06/01/20 2257 06/02/20 0450 06/02/20 0749 06/03/20 1013  HGB 13.5  --   --   --  12.7  --   --   HCT 40.7  --   --   --  39.4  --   --   PLT 245  --   --   --  219  --   --   HEPARINUNFRC  --   --   --  0.70  --  0.67 0.25*  CREATININE 0.93  --  0.84  --  0.62  --   --   TROPONINIHS 16 14  --   --   --   --   --     Estimated Creatinine Clearance: 75.8 mL/min (by C-G formula based on SCr of 0.62 mg/dL).   Medical History: Past Medical History:  Diagnosis Date  . Anemia   . Arthritis   . Diabetes mellitus without complication ([place])   . Hypertension     Medications: No anticoagulants PTA  Assessment: 16 yoF with PMH of HTN, DM2, presents 6/15 with chest/jaw pain and palpitations x 2 days. Found to be in a-fib with RVR. Pharmacy consulted to dose IV heparin. Cardiac cath on 6/16: no obstructive CAD.   Pharmacy consulted to transition anticoagulation from IV heparin to apixaban on 6/17 for new onset atrial fibrillation.   Age: 79; Wt: 112 kg; SCr 0.62   Plan:   Continue heparin drip at current rate of 1350 units/hr until 2000 this evening. At that time, transition to apixaban 5 mg PO BID  Follow CBC  Pt will need manufacturer coupon and education prior to discharge  Lenis Noon, PharmD 06/03/2020,3:52 PM

## 2020-06-03 NOTE — Progress Notes (Signed)
[facility] Progress Note  Patient: [name] [name]    DDU:[id number]  DOA: 06/01/2020     Date of Service: the patient was seen and examined on 06/03/2020  Chief Complaint  Patient presents with  . Chest Pain   Brief hospital course: EDWINA GROSSBERG is a 79 y.o. WF PMHx HLD, HTN, DM type II (diet-controlled),  Presenting to the ED with a chief complaint of chest pain. On 05/30/2020 started having left-sided jaw pain. Woke up yesterday with chest pain which she states was intermittent. Pain is worse with exerting herself, even walking around at home. This morning woke up with continued chest discomfort as well as palpitations. She feels as though her heart is racing. She was told in the past that her "heart skips a beat sometimes." She was never put on any medications for this. She had an echo done greater than 10 years ago. She does not regularly follow-up with a cardiologist. She denies any unilateral leg swelling, does note that both of her legs do get swollen for time to time which improved with elevation. She denies any hemoptysis, anticoagulant use, injuries or falls, vomiting, diarrhea, abdominal pain, fever. No history of DVT, PE, MI or thyroid issues.  Currently plan is monitor closely for A. fib treatment.  Assessment and Plan: 1.  New onset A. fib. Currently on IV heparin. Cardiology consulted. Also on IV Cardizem drip. Monitor on telemetry. Transitioning to oral Eliquis as well as oral Cardizem.  Monitor overnight on telemetry.  2.  Essential hypertension Blood pressure stable.  Monitor.  3.  Abnormal EKG Normal coronaries. Echocardiogram currently pending. No further work-up.  4.  Hypokalemia Corrected. Monitor.  5.  Pedal edema Negative for DVT lower extremity Doppler  6.  Obesity Body mass index is 39.77 kg/m.   7.  Left leg redness. From blister production patient has used 7 to 10 days of topical antibiotic cream. The does not appear to be  infected. We will use topical steroids.  Diet: Cardiac and carb liquid diet DVT Prophylaxis: Subcutaneous Heparin    Advance goals of care discussion: Full code  Family Communication: family was present at bedside, at the time of interview.  The pt provided permission to discuss medical plan with the family. Opportunity was given to ask question and all questions were answered satisfactorily.   Disposition:  Status is: Inpatient  Remains inpatient appropriate because:IV treatments appropriate due to intensity of illness or inability to take PO   Dispo: The patient is from: Home              Anticipated d/c is to: Home              Anticipated d/c date is: 1 day              Patient currently is not medically stable to d/c.        Subjective: No nausea no vomiting.  Breathing okay.  No fever no chills.  Rate remains controlled on telemetry overnight.  Physical Exam:  General: Appear in mild distress, left leg circular rash; Oral Mucosa Clear, moist. no Abnormal Neck Mass Or lumps, Conjunctiva normal  Cardiovascular: S1 and S2 Present, no Murmur, Respiratory: good respiratory effort, Bilateral Air entry present and Clear to Auscultation, no Crackles, no wheezes Abdomen: Bowel Sound present, Soft and no tenderness Extremities: bilateral  Pedal edema, no calf tenderness Neurology: alert and oriented to time, place, and person affect appropriate. no new focal deficit Gait not checked  due to patient safety concerns  Vitals:   06/02/20 1830 06/02/20 2027 06/03/20 0600 06/03/20 1259  BP: 127/68 (!) 118/56 119/72 (!) 147/85  Pulse: 85 83 82 84  Resp: 18 20 16 16   Temp: 98 F (36.7 C) (!) 97.4 F (36.3 C) 97.9 F (36.6 C) 97.8 F (36.6 C)  TempSrc: Oral Oral Oral Oral  SpO2: 97% 99% 96% 99%  Weight:      Height:        Intake/Output Summary (Last 24 hours) at 06/03/2020 2007 Last data filed at 06/03/2020 1900 Gross per 24 hour  Intake 1526.95 ml  Output --  Net  1526.95 ml   Filed Weights   06/01/20 1424 06/01/20 1647  Weight: 111.1 kg 111.8 kg    Data Reviewed: I have personally reviewed and interpreted daily labs, tele strips, imagings as discussed above. I reviewed all nursing notes, pharmacy notes, vitals, pertinent old records I have discussed plan of care as described above with RN and patient/family.  CBC: Recent Labs  Lab 06/01/20 1241 06/02/20 0450  WBC 9.3 9.3  NEUTROABS  --  5.7  HGB 13.5 12.7  HCT 40.7 39.4  MCV 88.7 89.7  PLT 245 580   Basic Metabolic Panel: Recent Labs  Lab 06/01/20 1241 06/01/20 1704 06/02/20 0450 06/03/20 1540  NA 139 141 139 141  K 3.0* 3.3* 3.1* 3.6  CL 105 107 108 108  CO2 20* 22 22 24   GLUCOSE 138* 115* 85 135*  BUN 21 19 17 14   CREATININE 0.93 0.84 0.62 0.66  CALCIUM 9.2 9.2 8.7* 8.7*  MG 1.8 2.0 1.9  --   PHOS  --  3.7 2.9  --     Studies: ECHOCARDIOGRAM COMPLETE  Result Date: 06/03/2020    ECHOCARDIOGRAM REPORT   Patient Name:   KEOSHIA STEINMETZ Date of Exam: 06/03/2020 Medical Rec #:  [id number]   Height:       66.0 in Accession #:    [id number]  Weight:       246.4 lb Date of Birth:  24-Apr-1941   BSA:          2.185 m Patient Age:    54 years    BP:           119/72 mmHg Patient Gender: F           HR:           75 bpm. Exam Location:  Inpatient Procedure: 2D Echo Indications:    New Onset Atrial Fibrillation  History:        Patient has no prior history of Echocardiogram examinations.                 Risk Factors:Hypertension and Diabetes.  Sonographer:    Mikki Santee RDCS (AE) Referring Phys: [place]  1. Left ventricular ejection fraction, by estimation, is 55 to 60%. The left ventricle has normal function. The left ventricle has no regional wall motion abnormalities. There is mild left ventricular hypertrophy. Left ventricular diastolic parameters are indeterminate.  2. Right ventricular systolic function is mildly reduced. The right ventricular size is  normal. There is normal pulmonary artery systolic pressure. The estimated right ventricular systolic pressure is 37.9 mmHg.  3. Left atrial size was mildly dilated.  4. The mitral valve is abnormal. Trivial mitral valve regurgitation. No evidence of mitral stenosis.  5. The aortic valve is abnormal. Aortic valve regurgitation is trivial. Moderate aortic valve stenosis. Aortic valve  mean gradient measures 20.0 mmHg.  6. The inferior vena cava is normal in size with greater than 50% respiratory variability, suggesting right atrial pressure of 3 mmHg. FINDINGS  Left Ventricle: Left ventricular ejection fraction, by estimation, is 55 to 60%. The left ventricle has normal function. The left ventricle has no regional wall motion abnormalities. The left ventricular internal cavity size was normal in size. There is  mild left ventricular hypertrophy. Left ventricular diastolic parameters are indeterminate. Right Ventricle: The right ventricular size is normal. No increase in right ventricular wall thickness. Right ventricular systolic function is mildly reduced. There is normal pulmonary artery systolic pressure. The tricuspid regurgitant velocity is 2.05 m/s, and with an assumed right atrial pressure of 3 mmHg, the estimated right ventricular systolic pressure is 78.2 mmHg. Left Atrium: Left atrial size was mildly dilated. Right Atrium: Right atrial size was normal in size. Pericardium: There is no evidence of pericardial effusion. Mitral Valve: The mitral valve is abnormal. Normal mobility of the mitral valve leaflets. Moderate mitral annular calcification. Trivial mitral valve regurgitation. No evidence of mitral valve stenosis. Tricuspid Valve: The tricuspid valve is normal in structure. Tricuspid valve regurgitation is trivial. No evidence of tricuspid stenosis. Aortic Valve: The aortic valve is abnormal. Aortic valve regurgitation is trivial. Moderate aortic stenosis is present. There is mild calcification of the  aortic valve. Aortic valve mean gradient measures 20.0 mmHg. Aortic valve peak gradient measures 33.1 mmHg. Aortic valve area, by VTI measures 0.90 cm. Pulmonic Valve: The pulmonic valve was normal in structure. Pulmonic valve regurgitation is not visualized. No evidence of pulmonic stenosis. Aorta: The aortic root is normal in size and structure. Venous: The inferior vena cava is normal in size with greater than 50% respiratory variability, suggesting right atrial pressure of 3 mmHg. IAS/Shunts: No atrial level shunt detected by color flow Doppler.  LEFT VENTRICLE PLAX 2D LVIDd:         4.30 cm LVIDs:         2.70 cm LV PW:         1.10 cm LV IVS:        1.00 cm LVOT diam:     2.00 cm LV SV:         46 LV SV Index:   21 LVOT Area:     3.14 cm  RIGHT VENTRICLE RV S prime:     13.70 cm/s TAPSE (M-mode): 1.5 cm LEFT ATRIUM             Index       RIGHT ATRIUM           Index LA diam:        3.30 cm 1.51 cm/m  RA Area:     12.80 cm LA Vol (A2C):   63.4 ml 29.01 ml/m RA Volume:   28.30 ml  12.95 ml/m LA Vol (A4C):   57.9 ml 26.50 ml/m LA Biplane Vol: 64.0 ml 29.29 ml/m  AORTIC VALVE AV Area (Vmax):    0.86 cm AV Area (Vmean):   0.89 cm AV Area (VTI):     0.90 cm AV Vmax:           287.67 cm/s AV Vmean:          186.000 cm/s AV VTI:            0.513 m AV Peak Grad:      33.1 mmHg AV Mean Grad:      20.0 mmHg LVOT Vmax:  79.07 cm/s LVOT Vmean:        52.867 cm/s LVOT VTI:          0.147 m LVOT/AV VTI ratio: 0.29  AORTA Ao Root diam: 3.20 cm TRICUSPID VALVE TR Peak grad:   16.8 mmHg TR Vmax:        205.00 cm/s  SHUNTS Systemic VTI:  0.15 m Systemic Diam: 2.00 cm Cherlynn Kaiser MD Electronically signed by Cherlynn Kaiser MD Signature Date/Time: 06/03/2020/1:12:32 PM    Final    VAS [place] LOWER EXTREMITY VENOUS (DVT)  Result Date: 06/03/2020  Lower Venous DVTStudy Indications: Edema.  Comparison Study: No prior study on file for comparison Performing Technologist: Sharion Dove RVS  Examination  Guidelines: A complete evaluation includes B-mode imaging, spectral Doppler, color Doppler, and power Doppler as needed of all accessible portions of each vessel. Bilateral testing is considered an integral part of a complete examination. Limited examinations for reoccurring indications may be performed as noted. The reflux portion of the exam is performed with the patient in reverse Trendelenburg.  +---------+---------------+---------+-----------+----------+--------------+ RIGHT    CompressibilityPhasicitySpontaneityPropertiesThrombus Aging +---------+---------------+---------+-----------+----------+--------------+ CFV      Full           Yes      Yes                                 +---------+---------------+---------+-----------+----------+--------------+ SFJ      Full                                                        +---------+---------------+---------+-----------+----------+--------------+ FV Prox  Full                                                        +---------+---------------+---------+-----------+----------+--------------+ FV Mid   Full                                                        +---------+---------------+---------+-----------+----------+--------------+ FV DistalFull                                                        +---------+---------------+---------+-----------+----------+--------------+ PFV      Full                                                        +---------+---------------+---------+-----------+----------+--------------+ POP      Full           Yes      Yes                                 +---------+---------------+---------+-----------+----------+--------------+  PTV      Full                                                        +---------+---------------+---------+-----------+----------+--------------+ PERO     Full                                                         +---------+---------------+---------+-----------+----------+--------------+   +---------+---------------+---------+-----------+----------+--------------+ LEFT     CompressibilityPhasicitySpontaneityPropertiesThrombus Aging +---------+---------------+---------+-----------+----------+--------------+ CFV      Full           Yes      Yes                                 +---------+---------------+---------+-----------+----------+--------------+ SFJ      Full                                                        +---------+---------------+---------+-----------+----------+--------------+ FV Prox  Full                                                        +---------+---------------+---------+-----------+----------+--------------+ FV Mid   Full                                                        +---------+---------------+---------+-----------+----------+--------------+ FV DistalFull                                                        +---------+---------------+---------+-----------+----------+--------------+ PFV      Full                                                        +---------+---------------+---------+-----------+----------+--------------+ POP      Full           Yes      Yes                                 +---------+---------------+---------+-----------+----------+--------------+ PTV      Full                                                        +---------+---------------+---------+-----------+----------+--------------+  PERO     Full                                                        +---------+---------------+---------+-----------+----------+--------------+     Summary: RIGHT: - There is no evidence of deep vein thrombosis in the lower extremity.  LEFT: - There is no evidence of deep vein thrombosis in the lower extremity.  *See table(s) above for measurements and observations. Electronically signed by Monica Martinez MD on  06/03/2020 at 3:19:28 PM.    Final     Scheduled Meds: . apixaban  5 mg Oral BID  . atorvastatin  20 mg Oral Daily  . diltiazem  120 mg Oral Daily  . sodium chloride flush  3 mL Intravenous Q12H  . sodium chloride flush  3 mL Intravenous Q12H   Continuous Infusions: . sodium chloride     PRN Meds: sodium chloride, acetaminophen **OR** acetaminophen, acetaminophen, ondansetron (ZOFRAN) IV, oxyCODONE, polyethylene glycol, sodium chloride flush, traZODone  Time spent: 35 minutes  Author: Berle Mull, MD [facility] 06/03/2020 8:07 PM  To reach On-call, see care teams to locate the attending and reach out via [email or web address]. Between 7PM-7AM, please contact night-coverage If you still have difficulty reaching the attending provider, please page the [facility] (Director on Call) for [facility] on amion for assistance.

## 2020-06-03 NOTE — Progress Notes (Signed)
  Echocardiogram 2D Echocardiogram has been performed.  Jennette Dubin 06/03/2020, 9:06 AM

## 2020-06-04 LAB — COMPREHENSIVE METABOLIC PANEL
ALT: 22 U/L (ref 0–44)
AST: 25 U/L (ref 15–41)
Albumin: 3.6 g/dL (ref 3.5–5.0)
Alkaline Phosphatase: 71 U/L (ref 38–126)
Anion gap: 5 (ref 5–15)
BUN: 12 mg/dL (ref 8–23)
CO2: 25 mmol/L (ref 22–32)
Calcium: 8.4 mg/dL — ABNORMAL LOW (ref 8.9–10.3)
Chloride: 109 mmol/L (ref 98–111)
Creatinine, Ser: 0.77 mg/dL (ref 0.44–1.00)
GFR calc Af Amer: >60 mL/min (ref >60)
GFR calc non Af Amer: >60 mL/min (ref >60)
Glucose, Bld: 106 mg/dL — ABNORMAL HIGH (ref 70–99)
Potassium: 3.6 mmol/L (ref 3.5–5.1)
Sodium: 139 mmol/L (ref 135–145)
Total Bilirubin: 0.8 mg/dL (ref 0.3–1.2)
Total Protein: 6.6 g/dL (ref 6.5–8.1)

## 2020-06-04 LAB — CBC WITH DIFFERENTIAL/PLATELET
Abs Immature Granulocytes: 0.03 10*3/uL (ref 0.00–0.07)
Basophils Absolute: 0.1 10*3/uL (ref 0.0–0.1)
Basophils Relative: 1 %
Eosinophils Absolute: 0.3 10*3/uL (ref 0.0–0.5)
Eosinophils Relative: 4 %
HCT: 39.6 % (ref 36.0–46.0)
Hemoglobin: 12.7 g/dL (ref 12.0–15.0)
Immature Granulocytes: 0 %
Lymphocytes Relative: 23 %
Lymphs Abs: 2.1 10*3/uL (ref 0.7–4.0)
MCH: 28.9 pg (ref 26.0–34.0)
MCHC: 32.1 g/dL (ref 30.0–36.0)
MCV: 90.2 fL (ref 80.0–100.0)
Monocytes Absolute: 0.7 10*3/uL (ref 0.1–1.0)
Monocytes Relative: 7 %
Neutro Abs: 5.9 10*3/uL (ref 1.7–7.7)
Neutrophils Relative %: 65 %
Platelets: 230 10*3/uL (ref 150–400)
RBC: 4.39 MIL/uL (ref 3.87–5.11)
RDW: 15.6 % — ABNORMAL HIGH (ref 11.5–15.5)
WBC: 9.1 10*3/uL (ref 4.0–10.5)
nRBC: 0 % (ref 0.0–0.2)

## 2020-06-04 LAB — MAGNESIUM: Magnesium: 2.2 mg/dL (ref 1.7–2.4)

## 2020-06-04 MED ORDER — ATORVASTATIN CALCIUM 20 MG PO TABS: 20.0000 mg | ORAL_TABLET | Freq: Every day | ORAL | 0 refills | Status: DC

## 2020-06-04 MED ORDER — FUROSEMIDE 20 MG PO TABS
20.0000 mg | ORAL_TABLET | Freq: Every day | ORAL | 0 refills | Status: DC | PRN
Start: 2020-06-04 — End: 2020-06-23

## 2020-06-04 MED ORDER — APIXABAN 5 MG PO TABS: 5.0000 mg | ORAL_TABLET | Freq: Two times a day (BID) | ORAL | 0 refills | Status: DC

## 2020-06-04 MED ORDER — HYDROCORTISONE 0.5 % EX CREA: TOPICAL_CREAM | Freq: Three times a day (TID) | CUTANEOUS | 0 refills | Status: DC

## 2020-06-04 MED ORDER — DILTIAZEM HCL ER COATED BEADS 120 MG PO CP24: 120.0000 mg | ORAL_CAPSULE | Freq: Every day | ORAL | 0 refills | Status: DC

## 2020-06-04 MED ORDER — POTASSIUM CHLORIDE ER 20 MEQ PO TBCR: 20.0000 meq | EXTENDED_RELEASE_TABLET | Freq: Every day | ORAL | 0 refills | Status: DC | PRN

## 2020-06-04 NOTE — Progress Notes (Signed)
Went over discharge papers with patient and family.  All questions answered.  VSS.  AVS given to patient, prescriptions sent to pharmacy.  Pt wheeled out via NT.

## 2020-06-04 NOTE — Care Management Important Message (Signed)
Important Message  Patient Details IM Letter given to Dessa Phi RN Case Manager to present to the Patient Name: [name] [name] MRN: [id number] Date of Birth: 1941/03/12   Medicare Important Message Given:  Yes     Kerin Salen 06/04/2020, 10:54 AM

## 2020-06-04 NOTE — Plan of Care (Signed)

## 2020-06-04 NOTE — Progress Notes (Signed)
Pt received scheduled dose of eliquis PO and stopped  IV heparin per orders.

## 2020-06-04 NOTE — Discharge Instructions (Signed)
Information on my medicine - ELIQUIS (apixaban)  This medication education was reviewed with me or my healthcare representative as part of my discharge preparation.     Why was Eliquis prescribed for you? Eliquis was prescribed for you to reduce the risk of a blood clot forming that can cause a stroke if you have a medical condition called atrial fibrillation (a type of irregular heartbeat).  What do You need to know about Eliquis ? Take your Eliquis TWICE DAILY - one tablet in the morning and one tablet in the evening with or without food. If you have difficulty swallowing the tablet whole please discuss with your pharmacist how to take the medication safely.  Take Eliquis exactly as prescribed by your doctor and DO NOT stop taking Eliquis without talking to the doctor who prescribed the medication.  Stopping may increase your risk of developing a stroke.  Refill your prescription before you run out.  After discharge, you should have regular check-up appointments with your healthcare provider that is prescribing your Eliquis.  In the future your dose may need to be changed if your kidney function or weight changes by a significant amount or as you get older.  What do you do if you miss a dose? If you miss a dose, take it as soon as you remember on the same day and resume taking twice daily.  Do not take more than one dose of ELIQUIS at the same time to make up a missed dose.  Important Safety Information A possible side effect of Eliquis is bleeding. You should call your healthcare provider right away if you experience any of the following: Bleeding from an injury or your nose that does not stop. Unusual colored urine (red or dark brown) or unusual colored stools (red or black). Unusual bruising for unknown reasons. A serious fall or if you hit your head (even if there is no bleeding).  Some medicines may interact with Eliquis and might increase your risk of bleeding or clotting  while on Eliquis. To help avoid this, consult your healthcare provider or pharmacist prior to using any new prescription or non-prescription medications, including herbals, vitamins, non-steroidal anti-inflammatory drugs (NSAIDs) and supplements.  This website has more information on Eliquis (apixaban): http://www.eliquis.com/eliquis/home =======================================  Atrial Fibrillation    Atrial fibrillation is a type of heartbeat that is irregular or fast. If you have this condition, your heart beats without any order. This makes it hard for your heart to pump blood in a normal way. Atrial fibrillation may come and go, or it may become a long-lasting problem. If this condition is not treated, it can put you at higher risk for stroke, heart failure, and other heart problems.  What are the causes? This condition may be caused by diseases that damage the heart. They include: High blood pressure. Heart failure. Heart valve disease. Heart surgery. Other causes include: Diabetes. Thyroid disease. Being overweight. Kidney disease. Sometimes the cause is not known.  What increases the risk? You are more likely to develop this condition if: You are older. You smoke. You exercise often and very hard. You have a family history of this condition. You are a man. You use drugs. You drink a lot of alcohol. You have lung conditions, such as emphysema, pneumonia, or COPD. You have sleep apnea.  What are the signs or symptoms? Common symptoms of this condition include: A feeling that your heart is beating very fast. Chest pain or discomfort. Feeling short of breath. Suddenly feeling   light-headed or weak. Getting tired easily during activity. Fainting. Sweating. In some cases, there are no symptoms.  How is this treated? Treatment for this condition depends on underlying conditions and how you feel when you have atrial fibrillation. They include: Medicines to: Prevent  blood clots. Treat heart rate or heart rhythm problems. Using devices, such as a pacemaker, to correct heart rhythm problems. Doing surgery to remove the part of the heart that sends bad signals. Closing an area where clots can form in the heart (left atrial appendage). In some cases, your doctor will treat other underlying conditions.  Follow these instructions at home:  Medicines Take over-the-counter and prescription medicines only as told by your doctor. Do not take any new medicines without first talking to your doctor. If you are taking blood thinners: Talk with your doctor before you take any medicines that have aspirin or NSAIDs, such as ibuprofen, in them. Take your medicine exactly as told by your doctor. Take it at the same time each day. Avoid activities that could hurt or bruise you. Follow instructions about how to prevent falls. Wear a bracelet that says you are taking blood thinners. Or, carry a card that lists what medicines you take. Lifestyle         Do not use any products that have nicotine or tobacco in them. These include cigarettes, e-cigarettes, and chewing tobacco. If you need help quitting, ask your doctor. Eat heart-healthy foods. Talk with your doctor about the right eating plan for you. Exercise regularly as told by your doctor. Do not drink alcohol. Lose weight if you are overweight. Do not use drugs, including cannabis.  General instructions If you have a condition that causes breathing to stop for a short period of time (apnea), treat it as told by your doctor. Keep a healthy weight. Do not use diet pills unless your doctor says they are safe for you. Diet pills may make heart problems worse. Keep all follow-up visits as told by your doctor. This is important.  Contact a doctor if: You notice a change in the speed, rhythm, or strength of your heartbeat. You are taking a blood-thinning medicine and you get more bruising. You get tired more easily  when you move or exercise. You have a sudden change in weight.  Get help right away if:    You have pain in your chest or your belly (abdomen). You have trouble breathing. You have side effects of blood thinners, such as blood in your vomit, poop (stool), or pee (urine), or bleeding that cannot stop. You have any signs of a stroke. "BE FAST" is an easy way to remember the main warning signs: B - Balance. Signs are dizziness, sudden trouble walking, or loss of balance. E - Eyes. Signs are trouble seeing or a change in how you see. F - Face. Signs are sudden weakness or loss of feeling in the face, or the face or eyelid drooping on one side. A - Arms. Signs are weakness or loss of feeling in an arm. This happens suddenly and usually on one side of the body. S - Speech. Signs are sudden trouble speaking, slurred speech, or trouble understanding what people say. T - Time. Time to call emergency services. Write down what time symptoms started. You have other signs of a stroke, such as: A sudden, very bad headache with no known cause. Feeling like you may vomit (nausea). Vomiting. A seizure.  These symptoms may be an emergency. Do not wait to   see if the symptoms will go away. Get medical help right away. Call your local emergency services (911 in the [place] Do not drive yourself to the hospital. Summary Atrial fibrillation is a type of heartbeat that is irregular or fast. You are at higher risk of this condition if you smoke, are older, have diabetes, or are overweight. Follow your doctor's instructions about medicines, diet, exercise, and follow-up visits. Get help right away if you have signs or symptoms of a stroke. Get help right away if you cannot catch your breath, or you have chest pain or discomfort. This information is not intended to replace advice given to you by your health care provider. Make sure you discuss any questions you have with your health care provider. Document  Revised: 05/28/2019 Document Reviewed: 05/28/2019 Elsevier Patient Education  2020 [employer]    

## 2020-06-06 NOTE — Discharge Summary (Signed)
[facility] Discharge Summary   Patient: [name] [name] TOI:[id number]  PCP: Glenis Smoker, MD  Date of admission: 06/01/2020   Date of discharge: 06/04/2020      Discharge Diagnoses:  Principal Problem:   Atrial fibrillation, new onset ([place]) Active Problems:   HTN (hypertension)   Diabetes ([place])   Hyperlipidemia   New onset atrial fibrillation ([place])   Precordial chest pain   Admitted From: Home Disposition:  Home   Recommendations for Outpatient Follow-up:  1. PCP: Follow-up in 1 week as well as follow-up with cardiologist recommended 2. Follow up LABS/TEST: None   Follow-up Information    Glenis Smoker, MD. Schedule an appointment as soon as possible for a visit in 1 week(s).   Specialty: Family Medicine Contact information: [place] [address] [phone]        Elouise Munroe, MD Follow up.   Specialties: Cardiology, Radiology Contact information: [address] [place] [address] [place] [address] [phone]              Diet recommendation: Cardiac diet  Activity: The patient is advised to gradually reintroduce usual activities, as tolerated  Discharge Condition: stable  Code Status: Full code   History of present illness: As per the H and P dictated on admission, "[name] [name] is a 79 y.o. WF PMHx HLD, HTN, DM type II (diet-controlled),  Presenting to the ED with a chief complaint of chest pain. On 05/30/2020 started having left-sided jaw pain. Woke up yesterday with chest pain which she states was intermittent. Pain is worse with exerting herself, even walking around at home. This morning woke up with continued chest discomfort as well as palpitations. She feels as though her heart is racing. She was told in the past that her "heart skips a beat sometimes." She was never put on any medications for this. She had an echo done greater than 10 years ago. She does not regularly follow-up with a  cardiologist. She denies any unilateral leg swelling, does note that both of her legs do get swollen for time to time which improved with elevation. She denies any hemoptysis, anticoagulant use, injuries or falls, vomiting, diarrhea, abdominal pain, fever. No history of DVT, PE, MI or thyroid issues."  Hospital Course:   Summary of her active problems in the hospital is as following. 1.  New onset A. fib. Treated with IV Cardizem and IV heparin. Cardiology consulted. Currently rate controlled. Transitioning to oral Eliquis as well as oral Cardizem.  2.  Essential hypertension Blood pressure stable.  3.  Abnormal EKG Normal coronaries. Echocardiogram shows preserved EF with diastolic dysfunction. No further work-up.  4.  Hypokalemia Corrected. Monitor.  5.  Pedal edema Negative for DVT lower extremity Doppler  6.  Obesity Body mass index is 39.77 kg/m.   7.  Left leg redness. From blister production patient has used 7 to 10 days of topical antibiotic cream. The does not appear to be infected. We will use topical steroids.  Patient was ambulatory without any assistance. On the day of the discharge the patient's vitals were stable, and no other acute medical condition were reported by patient. the patient was felt safe to be discharge at Home with no therapy needed on discharge.  Consultants: Cardiology  Procedures: Cardiac Catheterization  Echocardiogram   Discharge Exam: General: Appear in no distress, no Rash; Oral Mucosa Clear, moist. Cardiovascular: S1 and S2 Present, no Murmur, Respiratory: normal respiratory effort, Bilateral Air entry present  and no Crackles, no wheezes Abdomen: Bowel Sound present, Soft and no tenderness, no hernia Extremities: no Pedal edema, no calf tenderness Neurology: alert and oriented to time, place, and person affect appropriate.  Filed Weights   06/01/20 1424 06/01/20 1647  Weight: 111.1 kg 111.8 kg   Vitals:   06/04/20  0435 06/04/20 0923  BP: 125/63 125/63  Pulse: 78 80  Resp: 16 18  Temp: 98 F (36.7 C) 97.7 F (36.5 C)  SpO2: 98% 98%    DISCHARGE MEDICATION: Allergies as of 06/04/2020   No Known Allergies     Medication List    STOP taking these medications   aspirin 81 MG tablet   losartan-hydrochlorothiazide 100-25 MG tablet Commonly known as: HYZAAR     TAKE these medications   acetaminophen 500 MG tablet Commonly known as: TYLENOL Take 500-1,000 mg by mouth every 6 (six) hours as needed (shoulder pain).   apixaban 5 MG Tabs tablet Commonly known as: ELIQUIS Take 1 tablet (5 mg total) by mouth 2 (two) times daily.   atorvastatin 20 MG tablet Commonly known as: LIPITOR Take 1 tablet (20 mg total) by mouth daily.   BLUE-EMU PAIN RELIEF DRY EX Apply 1 application topically in the morning and at bedtime. Both shoulders and both knees   CALCIUM PO Take 1 tablet by mouth See admin instructions. Take one tablet daily, take an additional one tablet every three days.   diltiazem 120 MG 24 hr capsule Commonly known as: CARDIZEM CD Take 1 capsule (120 mg total) by mouth daily.   furosemide 20 MG tablet Commonly known as: Lasix Take 1 tablet (20 mg total) by mouth daily as needed (edema in the leg with blisters, weight gain of 3lbs in 1 day or 5lbs in 2 days.).   hydrocortisone cream 0.5 % Apply topically 3 (three) times daily.   ONE-A-DAY 50 PLUS PO Take 1 tablet by mouth daily.   PRESERVISION AREDS 2 PO Take 1 capsule by mouth in the morning and at bedtime.   Potassium Chloride ER 20 MEQ Tbcr Take 20 mEq by mouth daily as needed (take when you take lasix.).      No Known Allergies Discharge Instructions    Diet - low sodium heart healthy   Complete by: As directed    Increase activity slowly   Complete by: As directed       The results of significant diagnostics from this hospitalization (including imaging, microbiology, ancillary and laboratory) are listed below  for reference.    Significant Diagnostic Studies: DG Chest 2 View  Result Date: 06/01/2020 CLINICAL DATA:  Chest pain, short of breath EXAM: CHEST - 2 VIEW COMPARISON:  12/28/2010 FINDINGS: Normal mediastinum and cardiac silhouette. Chronic central bronchitic markings. Normal pulmonary vasculature. No effusion, infiltrate, or pneumothorax. IMPRESSION: Chronic bronchitic markings.  No acute findings. Electronically Signed   By: Suzy Bouchard M.D.   On: 06/01/2020 12:50   CARDIAC CATHETERIZATION  Result Date: 06/02/2020  The left ventricular systolic function is normal.  LV end diastolic pressure is normal.  The left ventricular ejection fraction is 50-55% by visual estimate.  There is no aortic valve stenosis.  Minimal, nobstructive CAD. Tortuous cororany arteries.  OK to restart heparin 8 hours post sheath pull. Continue preventive therapy.   ECHOCARDIOGRAM COMPLETE  Result Date: 06/03/2020    ECHOCARDIOGRAM REPORT   Patient Name:   [name] [name] Date of Exam: 06/03/2020 Medical Rec #:  [id number]   Height:  66.0 in Accession #:    [id number]  Weight:       246.4 lb Date of Birth:  29-Jun-1941   BSA:          2.185 m Patient Age:    12 years    BP:           119/72 mmHg Patient Gender: F           HR:           75 bpm. Exam Location:  Inpatient Procedure: 2D Echo Indications:    New Onset Atrial Fibrillation  History:        Patient has no prior history of Echocardiogram examinations.                 Risk Factors:Hypertension and Diabetes.  Sonographer:    Mikki Santee RDCS (AE) Referring Phys: [place]  1. Left ventricular ejection fraction, by estimation, is 55 to 60%. The left ventricle has normal function. The left ventricle has no regional wall motion abnormalities. There is mild left ventricular hypertrophy. Left ventricular diastolic parameters are indeterminate.  2. Right ventricular systolic function is mildly reduced. The right ventricular size is  normal. There is normal pulmonary artery systolic pressure. The estimated right ventricular systolic pressure is 35.5 mmHg.  3. Left atrial size was mildly dilated.  4. The mitral valve is abnormal. Trivial mitral valve regurgitation. No evidence of mitral stenosis.  5. The aortic valve is abnormal. Aortic valve regurgitation is trivial. Moderate aortic valve stenosis. Aortic valve mean gradient measures 20.0 mmHg.  6. The inferior vena cava is normal in size with greater than 50% respiratory variability, suggesting right atrial pressure of 3 mmHg. FINDINGS  Left Ventricle: Left ventricular ejection fraction, by estimation, is 55 to 60%. The left ventricle has normal function. The left ventricle has no regional wall motion abnormalities. The left ventricular internal cavity size was normal in size. There is  mild left ventricular hypertrophy. Left ventricular diastolic parameters are indeterminate. Right Ventricle: The right ventricular size is normal. No increase in right ventricular wall thickness. Right ventricular systolic function is mildly reduced. There is normal pulmonary artery systolic pressure. The tricuspid regurgitant velocity is 2.05 m/s, and with an assumed right atrial pressure of 3 mmHg, the estimated right ventricular systolic pressure is 73.2 mmHg. Left Atrium: Left atrial size was mildly dilated. Right Atrium: Right atrial size was normal in size. Pericardium: There is no evidence of pericardial effusion. Mitral Valve: The mitral valve is abnormal. Normal mobility of the mitral valve leaflets. Moderate mitral annular calcification. Trivial mitral valve regurgitation. No evidence of mitral valve stenosis. Tricuspid Valve: The tricuspid valve is normal in structure. Tricuspid valve regurgitation is trivial. No evidence of tricuspid stenosis. Aortic Valve: The aortic valve is abnormal. Aortic valve regurgitation is trivial. Moderate aortic stenosis is present. There is mild calcification of the  aortic valve. Aortic valve mean gradient measures 20.0 mmHg. Aortic valve peak gradient measures 33.1 mmHg. Aortic valve area, by VTI measures 0.90 cm. Pulmonic Valve: The pulmonic valve was normal in structure. Pulmonic valve regurgitation is not visualized. No evidence of pulmonic stenosis. Aorta: The aortic root is normal in size and structure. Venous: The inferior vena cava is normal in size with greater than 50% respiratory variability, suggesting right atrial pressure of 3 mmHg. IAS/Shunts: No atrial level shunt detected by color flow Doppler.  LEFT VENTRICLE PLAX 2D LVIDd:         4.30 cm  LVIDs:         2.70 cm LV PW:         1.10 cm LV IVS:        1.00 cm LVOT diam:     2.00 cm LV SV:         46 LV SV Index:   21 LVOT Area:     3.14 cm  RIGHT VENTRICLE RV S prime:     13.70 cm/s TAPSE (M-mode): 1.5 cm LEFT ATRIUM             Index       RIGHT ATRIUM           Index LA diam:        3.30 cm 1.51 cm/m  RA Area:     12.80 cm LA Vol (A2C):   63.4 ml 29.01 ml/m RA Volume:   28.30 ml  12.95 ml/m LA Vol (A4C):   57.9 ml 26.50 ml/m LA Biplane Vol: 64.0 ml 29.29 ml/m  AORTIC VALVE AV Area (Vmax):    0.86 cm AV Area (Vmean):   0.89 cm AV Area (VTI):     0.90 cm AV Vmax:           287.67 cm/s AV Vmean:          186.000 cm/s AV VTI:            0.513 m AV Peak Grad:      33.1 mmHg AV Mean Grad:      20.0 mmHg LVOT Vmax:         79.07 cm/s LVOT Vmean:        52.867 cm/s LVOT VTI:          0.147 m LVOT/AV VTI ratio: 0.29  AORTA Ao Root diam: 3.20 cm TRICUSPID VALVE TR Peak grad:   16.8 mmHg TR Vmax:        205.00 cm/s  SHUNTS Systemic VTI:  0.15 m Systemic Diam: 2.00 cm Cherlynn Kaiser MD Electronically signed by Cherlynn Kaiser MD Signature Date/Time: 06/03/2020/1:12:32 PM    Final    VAS [place] LOWER EXTREMITY VENOUS (DVT)  Result Date: 06/03/2020  Lower Venous DVTStudy Indications: Edema.  Comparison Study: No prior study on file for comparison Performing Technologist: Sharion Dove RVS  Examination  Guidelines: A complete evaluation includes B-mode imaging, spectral Doppler, color Doppler, and power Doppler as needed of all accessible portions of each vessel. Bilateral testing is considered an integral part of a complete examination. Limited examinations for reoccurring indications may be performed as noted. The reflux portion of the exam is performed with the patient in reverse Trendelenburg.  +---------+---------------+---------+-----------+----------+--------------+ RIGHT    CompressibilityPhasicitySpontaneityPropertiesThrombus Aging +---------+---------------+---------+-----------+----------+--------------+ CFV      Full           Yes      Yes                                 +---------+---------------+---------+-----------+----------+--------------+ SFJ      Full                                                        +---------+---------------+---------+-----------+----------+--------------+ FV Prox  Full                                                        +---------+---------------+---------+-----------+----------+--------------+  FV Mid   Full                                                        +---------+---------------+---------+-----------+----------+--------------+ FV DistalFull                                                        +---------+---------------+---------+-----------+----------+--------------+ PFV      Full                                                        +---------+---------------+---------+-----------+----------+--------------+ POP      Full           Yes      Yes                                 +---------+---------------+---------+-----------+----------+--------------+ PTV      Full                                                        +---------+---------------+---------+-----------+----------+--------------+ PERO     Full                                                         +---------+---------------+---------+-----------+----------+--------------+   +---------+---------------+---------+-----------+----------+--------------+ LEFT     CompressibilityPhasicitySpontaneityPropertiesThrombus Aging +---------+---------------+---------+-----------+----------+--------------+ CFV      Full           Yes      Yes                                 +---------+---------------+---------+-----------+----------+--------------+ SFJ      Full                                                        +---------+---------------+---------+-----------+----------+--------------+ FV Prox  Full                                                        +---------+---------------+---------+-----------+----------+--------------+ FV Mid   Full                                                        +---------+---------------+---------+-----------+----------+--------------+  FV DistalFull                                                        +---------+---------------+---------+-----------+----------+--------------+ PFV      Full                                                        +---------+---------------+---------+-----------+----------+--------------+ POP      Full           Yes      Yes                                 +---------+---------------+---------+-----------+----------+--------------+ PTV      Full                                                        +---------+---------------+---------+-----------+----------+--------------+ PERO     Full                                                        +---------+---------------+---------+-----------+----------+--------------+     Summary: RIGHT: - There is no evidence of deep vein thrombosis in the lower extremity.  LEFT: - There is no evidence of deep vein thrombosis in the lower extremity.  *See table(s) above for measurements and observations. Electronically signed by Monica Martinez MD on  06/03/2020 at 3:19:28 PM.    Final     Microbiology: Recent Results (from the past 240 hour(s))  SARS Coronavirus 2 by RT PCR (hospital order, performed in [facility] hospital lab) Nasopharyngeal Nasopharyngeal Swab     Status: None   Collection Time: 06/01/20  1:59 PM   Specimen: Nasopharyngeal Swab  Result Value Ref Range Status   SARS Coronavirus 2 NEGATIVE NEGATIVE Final    Comment: (NOTE) SARS-CoV-2 target nucleic acids are NOT DETECTED.  The SARS-CoV-2 RNA is generally detectable in upper and lower respiratory specimens during the acute phase of infection. The lowest concentration of SARS-CoV-2 viral copies this assay can detect is 250 copies / mL. A negative result does not preclude SARS-CoV-2 infection and should not be used as the sole basis for treatment or other patient management decisions.  A negative result may occur with improper specimen collection / handling, submission of specimen other than nasopharyngeal swab, presence of viral mutation(s) within the areas targeted by this assay, and inadequate number of viral copies (<250 copies / mL). A negative result must be combined with clinical observations, patient history, and epidemiological information.  Fact Sheet for Patients:   StrictlyIdeas.no  Fact Sheet for Healthcare Providers: BankingDealers.co.za  This test is not yet approved or  cleared by the [place] FDA and has been authorized for detection and/or diagnosis of SARS-CoV-2 by FDA under an Emergency Use Authorization (EUA).  This EUA  will remain in effect (meaning this test can be used) for the duration of the COVID-19 declaration under Section 564(b)(1) of the Act, 21 U.S.C. section 360bbb-3(b)(1), unless the authorization is terminated or revoked sooner.  Performed at [facility], [place] [address] [place], [place] [address]      Labs: CBC: Recent Labs  Lab 06/01/20 1241  06/02/20 0450 06/04/20 0529  WBC 9.3 9.3 9.1  NEUTROABS  --  5.7 5.9  HGB 13.5 12.7 12.7  HCT 40.7 39.4 39.6  MCV 88.7 89.7 90.2  PLT 245 219 983   Basic Metabolic Panel: Recent Labs  Lab 06/01/20 1241 06/01/20 1704 06/02/20 0450 06/03/20 1540 06/04/20 0529  NA 139 141 139 141 139  K 3.0* 3.3* 3.1* 3.6 3.6  CL 105 107 108 108 109  CO2 20* 22 22 24 25   GLUCOSE 138* 115* 85 135* 106*  BUN 21 19 17 14 12   CREATININE 0.93 0.84 0.62 0.66 0.77  CALCIUM 9.2 9.2 8.7* 8.7* 8.4*  MG 1.8 2.0 1.9  --  2.2  PHOS  --  3.7 2.9  --   --    Liver Function Tests: Recent Labs  Lab 06/02/20 0450 06/04/20 0529  AST 28 25  ALT 21 22  ALKPHOS 62 71  BILITOT 0.6 0.8  PROT 6.1* 6.6  ALBUMIN 3.3* 3.6   No results for input(s): LIPASE, AMYLASE in the last 168 hours. No results for input(s): AMMONIA in the last 168 hours. Cardiac Enzymes: No results for input(s): CKTOTAL, CKMB, CKMBINDEX, TROPONINI in the last 168 hours. BNP (last 3 results) Recent Labs    06/01/20 1241  BNP 298.0*   CBG: Recent Labs  Lab 06/02/20 1602 06/03/20 2036  GLUCAP 79 148*    Time spent: 35 minutes  Signed:  Berle Mull  [facility] 06/04/2020 4:19 PM

## 2020-06-11 ENCOUNTER — Other Ambulatory Visit: Payer: Self-pay

## 2020-06-11 ENCOUNTER — Encounter (HOSPITAL_COMMUNITY): Payer: Self-pay | Admitting: Nurse Practitioner

## 2020-06-11 ENCOUNTER — Ambulatory Visit (HOSPITAL_COMMUNITY)
Admission: RE | Admit: 2020-06-11 | Discharge: 2020-06-11 | Disposition: A | Discharge status: Home / Self Care | Payer: Medicare Other | Source: Ambulatory Visit | Attending: Nurse Practitioner | Admitting: Nurse Practitioner

## 2020-06-11 VITALS — BP 130/72 | HR 116 | Ht 66.0 in | Wt 247.6 lb

## 2020-06-11 DIAGNOSIS — M1990 Unspecified osteoarthritis, unspecified site: Secondary | ICD-10-CM | POA: Insufficient documentation

## 2020-06-11 DIAGNOSIS — Z79899 Other long term (current) drug therapy: Secondary | ICD-10-CM | POA: Diagnosis not present

## 2020-06-11 DIAGNOSIS — Z9049 Acquired absence of other specified parts of digestive tract: Secondary | ICD-10-CM | POA: Diagnosis not present

## 2020-06-11 DIAGNOSIS — Z833 Family history of diabetes mellitus: Secondary | ICD-10-CM | POA: Insufficient documentation

## 2020-06-11 DIAGNOSIS — I4819 Other persistent atrial fibrillation: Secondary | ICD-10-CM | POA: Diagnosis not present

## 2020-06-11 DIAGNOSIS — E119 Type 2 diabetes mellitus without complications: Secondary | ICD-10-CM | POA: Insufficient documentation

## 2020-06-11 DIAGNOSIS — Z90710 Acquired absence of both cervix and uterus: Secondary | ICD-10-CM | POA: Diagnosis not present

## 2020-06-11 DIAGNOSIS — I10 Essential (primary) hypertension: Secondary | ICD-10-CM | POA: Insufficient documentation

## 2020-06-11 DIAGNOSIS — I2510 Atherosclerotic heart disease of native coronary artery without angina pectoris: Secondary | ICD-10-CM | POA: Insufficient documentation

## 2020-06-11 DIAGNOSIS — Z8249 Family history of ischemic heart disease and other diseases of the circulatory system: Secondary | ICD-10-CM | POA: Diagnosis not present

## 2020-06-11 DIAGNOSIS — Z7901 Long term (current) use of anticoagulants: Secondary | ICD-10-CM | POA: Insufficient documentation

## 2020-06-11 DIAGNOSIS — I4891 Unspecified atrial fibrillation: Secondary | ICD-10-CM

## 2020-06-11 NOTE — Patient Instructions (Signed)
Call on Monday with update on heart rates.

## 2020-06-11 NOTE — Progress Notes (Addendum)
Primary Care Physician: Glenis Smoker, MD Referring Physician:Acharya,Gayatri , MD   [name] [name] is a 79 y.o. female with a h/o HTN, DM, that presented tp [facility] ED with chest pain and found to have new onset afib. She was admitted and had an echo that was unremarkable and a Left heart cath that showed mimimal nonobstructive CAD. She was discharfed in afib on diltiazem 120 mg qd and eliquis 5 mg bid for a CHA2DS2VASc score of 5.  In the afib clinic she remains in afib with RVR. Overall she feels ok.No  further chest pain. She does not drink alcohol, drinks several diet Pepsi's a day, no tobacco and denies snoring.  She does have intermittent LLE, Rt > Lt and was d/c on lasix as needed.   Today, she denies symptoms of palpitations, chest pain, shortness of breath, orthopnea, PND, lower extremity edema, dizziness, presyncope, syncope, or neurologic sequela. The patient is tolerating medications without difficulties and is otherwise without complaint today.   Past Medical History:  Diagnosis Date  . Anemia   . Arthritis   . Diabetes mellitus without complication ([place])   . Hypertension    Past Surgical History:  Procedure Laterality Date  . ABDOMINAL HYSTERECTOMY    . CHOLECYSTECTOMY    . LEFT HEART CATH AND CORONARY ANGIOGRAPHY N/A 06/02/2020   Procedure: LEFT HEART CATH AND CORONARY ANGIOGRAPHY;  Surgeon: Jettie Booze, MD;  Location: [place] [facility];  Service: Cardiovascular;  Laterality: N/A;    Current Outpatient Medications  Medication Sig Dispense Refill  . acetaminophen (TYLENOL) 500 MG tablet Take 500-1,000 mg by mouth as needed (shoulder pain).     [name] apixaban (ELIQUIS) 5 MG TABS tablet Take 1 tablet (5 mg total) by mouth 2 (two) times daily. 60 tablet 0  . atorvastatin (LIPITOR) 20 MG tablet Take 1 tablet (20 mg total) by mouth daily. 30 tablet 0  . Calcium Carb-Cholecalciferol (CALCIUM 600/VITAMIN D3 PO) Take 1 tablet by mouth daily.    [name] diltiazem (CARDIZEM  CD) 120 MG 24 hr capsule Take 1 capsule (120 mg total) by mouth daily. 30 capsule 0  . furosemide (LASIX) 20 MG tablet Take 1 tablet (20 mg total) by mouth daily as needed (edema in the leg with blisters, weight gain of 3lbs in 1 day or 5lbs in 2 days.). (Patient taking differently: Take 20 mg by mouth as needed (edema in the leg with blisters, weight gain of 3lbs in 1 day or 5lbs in 2 days.). ) 30 tablet 0  . hydrocortisone cream 0.5 % Apply topically 3 (three) times daily. 30 g 0  . Lidocaine (BLUE-EMU PAIN RELIEF DRY EX) Apply 1 application topically in the morning and at bedtime. Both shoulders and both knees    . Multiple Vitamins-Minerals (ONE-A-DAY 50 PLUS PO) Take 1 tablet by mouth daily.     . Multiple Vitamins-Minerals (PRESERVISION AREDS 2 PO) Take 1 capsule by mouth in the morning and at bedtime.    . potassium chloride 20 MEQ TBCR Take 20 mEq by mouth daily as needed (take when you take lasix.). 10 tablet 0   No current facility-administered medications for this encounter.    Allergies  Allergen Reactions  . Tetanus-Diphtheria Toxoids Td Other (See Comments)    Felt very sick    Social History   Socioeconomic History  . Marital status: Married    Spouse name: Not on file  . Number of children: Not on file  . Years of  education: Not on file  . Highest education level: Not on file  Occupational History  . Not on file  Tobacco Use  . Smoking status: Never Smoker  . Smokeless tobacco: Never Used  Substance and Sexual Activity  . Alcohol use: Not on file  . Drug use: Not on file  . Sexual activity: Not Currently  Other Topics Concern  . Not on file  Social History Narrative  . Not on file   Social Determinants of Health   Financial Resource Strain:   . Difficulty of Paying Living Expenses:   Food Insecurity:   . Worried About Charity fundraiser in the Last Year:   . Arboriculturist in the Last Year:   Transportation Needs:   . Film/video editor (Medical):    [name] Lack of Transportation (Non-Medical):   Physical Activity:   . Days of Exercise per Week:   . Minutes of Exercise per Session:   Stress:   . Feeling of Stress :   Social Connections:   . Frequency of Communication with Friends and Family:   . Frequency of Social Gatherings with Friends and Family:   . Attends Religious Services:   . Active Member of Clubs or Organizations:   . Attends Archivist Meetings:   [name] Marital Status:   Intimate Partner Violence:   . Fear of Current or Ex-Partner:   . Emotionally Abused:   [name] Physically Abused:   . Sexually Abused:     Family History  Problem Relation Age of Onset  . Hypertension Mother   . Hypertension Father   . Diabetes Paternal Grandmother     ROS- All systems are reviewed and negative except as per the HPI above  Physical Exam: Vitals:   06/11/20 0957  BP: 130/72  Pulse: (!) 116  Weight: 112.3 kg  Height: 5\' 6"  (1.676 m)   Wt Readings from Last 3 Encounters:  06/11/20 112.3 kg  06/01/20 111.8 kg  03/24/16 106.6 kg    Labs: Lab Results  Component Value Date   NA 139 06/04/2020   K 3.6 06/04/2020   CL 109 06/04/2020   CO2 25 06/04/2020   GLUCOSE 106 (H) 06/04/2020   BUN 12 06/04/2020   CREATININE 0.77 06/04/2020   CALCIUM 8.4 (L) 06/04/2020   PHOS 2.9 06/02/2020   MG 2.2 06/04/2020   No results found for: INR Lab Results  Component Value Date   CHOL 184 06/01/2020   HDL 51 06/01/2020   LDLCALC 120 (H) 06/01/2020   TRIG 67 06/01/2020     GEN- The patient is well appearing, alert and oriented x 3 today.   Head- normocephalic, atraumatic Eyes-  Sclera clear, conjunctiva pink Ears- hearing intact Oropharynx- clear Neck- supple, no JVP Lymph- no cervical lymphadenopathy Lungs- Clear to ausculation bilaterally, normal work of breathing Heart- irregular rate and rhythm, no murmurs, rubs or gallops, PMI not laterally displaced GI- soft, NT, ND, + BS Extremities- no clubbing, cyanosis, or   Trace edema on left, 1+ on rt  MS- no significant deformity or atrophy Skin- no rash or lesion Psych- euthymic mood, full affect Neuro- strength and sensation are intact  EKG-afib at 116 pbm, qrs int 92 ms, qtc 483 ms   LHC- The left ventricular systolic function is normal.  LV end diastolic pressure is normal.  The left ventricular ejection fraction is 50-55% by visual estimate.  There is no aortic valve stenosis.  Minimal, nobstructive CAD. Tortuous cororany arteries.  Echo-1. Left ventricular ejection fraction, by estimation, is 55 to 60%. The  left ventricle has normal function. The left ventricle has no regional  wall motion abnormalities. There is mild left ventricular hypertrophy.  Left ventricular diastolic parameters  are indeterminate.  2. Right ventricular systolic function is mildly reduced. The right  ventricular size is normal. There is normal pulmonary artery systolic  pressure. The estimated right ventricular systolic pressure is 62.8 mmHg.  3. Left atrial size was mildly dilated.  4. The mitral valve is abnormal. Trivial mitral valve regurgitation. No  evidence of mitral stenosis.  5. The aortic valve is abnormal. Aortic valve regurgitation is trivial.  Moderate aortic valve stenosis. Aortic valve mean gradient measures 20.0  mmHg.  6. The inferior vena cava is normal in size with greater than 50%  respiratory variability, suggesting right atrial pressure of 3 mmHg.   Assessment and Plan: 1. New onset persistent afib General discussion of afib and triggers Pt has cut back on caffeine use Regular exercise/ weight loss encouraged  Pt has not been monitoring v rates at home She will track with pulse ox and BP readings over the week end She will call on Monday and if v rates are over 100 consistently ,  and BP's are adequate, will start BB Do not want to increase CCB as she has LLE   2. CHA2DS2VASc score of  at least 5  Continue eliquis 5 mg bid  If  after 21 days of uninterrupted  anticoagulation , will plan on cardioversion, if remains in afib Will count 6/18 as first full day   Recheck here in 7-10 days   Butch Penny C. Austan Nicholl, [facility] [address] [place], [place] [address] [phone]

## 2020-06-14 ENCOUNTER — Telehealth (HOSPITAL_COMMUNITY): Payer: Self-pay | Admitting: *Deleted

## 2020-06-14 NOTE — Telephone Encounter (Signed)
Pt called with update of HRs over weekend - ranging from 80-109 BP 111-124/74-84 per donna carroll np will not adjust medication see back in 10 days. Pt in agreement.

## 2020-06-23 ENCOUNTER — Other Ambulatory Visit: Payer: Self-pay

## 2020-06-23 ENCOUNTER — Ambulatory Visit (HOSPITAL_COMMUNITY)
Admission: RE | Admit: 2020-06-23 | Discharge: 2020-06-23 | Disposition: A | Discharge status: Home / Self Care | Payer: Medicare Other | Source: Ambulatory Visit | Attending: Nurse Practitioner | Admitting: Nurse Practitioner

## 2020-06-23 ENCOUNTER — Encounter (HOSPITAL_COMMUNITY): Payer: Self-pay | Admitting: Nurse Practitioner

## 2020-06-23 VITALS — BP 124/70 | HR 99 | Ht 66.0 in | Wt 248.8 lb

## 2020-06-23 DIAGNOSIS — I4891 Unspecified atrial fibrillation: Secondary | ICD-10-CM

## 2020-06-23 DIAGNOSIS — I10 Essential (primary) hypertension: Secondary | ICD-10-CM | POA: Insufficient documentation

## 2020-06-23 DIAGNOSIS — Z79899 Other long term (current) drug therapy: Secondary | ICD-10-CM | POA: Diagnosis not present

## 2020-06-23 DIAGNOSIS — I4819 Other persistent atrial fibrillation: Secondary | ICD-10-CM | POA: Diagnosis not present

## 2020-06-23 DIAGNOSIS — Z7901 Long term (current) use of anticoagulants: Secondary | ICD-10-CM | POA: Diagnosis not present

## 2020-06-23 DIAGNOSIS — D6869 Other thrombophilia: Secondary | ICD-10-CM | POA: Diagnosis not present

## 2020-06-23 DIAGNOSIS — E119 Type 2 diabetes mellitus without complications: Secondary | ICD-10-CM | POA: Diagnosis not present

## 2020-06-23 LAB — BASIC METABOLIC PANEL
Anion gap: 10 (ref 5–15)
BUN: 14 mg/dL (ref 8–23)
CO2: 25 mmol/L (ref 22–32)
Calcium: 8.9 mg/dL (ref 8.9–10.3)
Chloride: 107 mmol/L (ref 98–111)
Creatinine, Ser: 0.85 mg/dL (ref 0.44–1.00)
GFR calc Af Amer: >60 mL/min (ref >60)
GFR calc non Af Amer: >60 mL/min (ref >60)
Glucose, Bld: 110 mg/dL — ABNORMAL HIGH (ref 70–99)
Potassium: 4.1 mmol/L (ref 3.5–5.1)
Sodium: 142 mmol/L (ref 135–145)

## 2020-06-23 LAB — CBC
HCT: 38.7 % (ref 36.0–46.0)
Hemoglobin: 12.2 g/dL (ref 12.0–15.0)
MCH: 28.8 pg (ref 26.0–34.0)
MCHC: 31.5 g/dL (ref 30.0–36.0)
MCV: 91.5 fL (ref 80.0–100.0)
Platelets: 259 10*3/uL (ref 150–400)
RBC: 4.23 MIL/uL (ref 3.87–5.11)
RDW: 15.6 % — ABNORMAL HIGH (ref 11.5–15.5)
WBC: 8 10*3/uL (ref 4.0–10.5)
nRBC: 0 % (ref 0.0–0.2)

## 2020-06-23 MED ORDER — POTASSIUM CHLORIDE ER 20 MEQ PO TBCR: 20.0000 meq | EXTENDED_RELEASE_TABLET | Freq: Every day | ORAL | 2 refills | Status: DC | PRN

## 2020-06-23 MED ORDER — FUROSEMIDE 20 MG PO TABS: 20.0000 mg | ORAL_TABLET | Freq: Every day | ORAL | 2 refills | Status: DC | PRN

## 2020-06-23 MED ORDER — DILTIAZEM HCL ER COATED BEADS 120 MG PO CP24: 120.0000 mg | ORAL_CAPSULE | Freq: Every day | ORAL | 3 refills | Status: DC

## 2020-06-23 MED ORDER — APIXABAN 5 MG PO TABS: 5.0000 mg | ORAL_TABLET | Freq: Two times a day (BID) | ORAL | 3 refills | Status: DC

## 2020-06-23 NOTE — Progress Notes (Signed)
Primary Care Physician: Glenis Smoker, MD Referring Physician:Acharya,Gayatri , MD   [name] [name] is a 79 y.o. female with a h/o HTN, DM, that presented tp [facility] ED with chest pain and found to have new onset afib. She was admitted and had an echo that was unremarkable and a Left heart cath that showed mimimal nonobstructive CAD. She was discharfed in afib on diltiazem 120 mg qd and eliquis 5 mg bid for a CHA2DS2VASc score of 5.  In the afib clinic she remains in afib with RVR. Overall she feels ok.No  further chest pain. She does not drink alcohol, drinks several diet Pepsi's a day, no tobacco and denies snoring.  She does have intermittent LLE, Rt > Lt and was d/c on lasix as needed.   F/u in afib clinic 7/7. She is now approaching the full 3 week requirement of DOAC for  cardioversion. She  has chronic lower extremity edema which preceded afib but has required more lasix since in afib. She is rate controlled.   Today, she denies symptoms of palpitations, chest pain, shortness of breath, orthopnea, PND, lower extremity edema, dizziness, presyncope, syncope, or neurologic sequela. The patient is tolerating medications without difficulties and is otherwise without complaint today.   Past Medical History:  Diagnosis Date  . Anemia   . Arthritis   . Diabetes mellitus without complication ([place])   . Hypertension    Past Surgical History:  Procedure Laterality Date  . ABDOMINAL HYSTERECTOMY    . CHOLECYSTECTOMY    . LEFT HEART CATH AND CORONARY ANGIOGRAPHY N/A 06/02/2020   Procedure: LEFT HEART CATH AND CORONARY ANGIOGRAPHY;  Surgeon: Jettie Booze, MD;  Location: [place] [facility];  Service: Cardiovascular;  Laterality: N/A;    Current Outpatient Medications  Medication Sig Dispense Refill  . acetaminophen (TYLENOL) 500 MG tablet Take 500-1,000 mg by mouth as needed (shoulder pain).     [name] apixaban (ELIQUIS) 5 MG TABS tablet Take 1 tablet (5 mg total) by mouth 2 (two) times  daily. 60 tablet 3  . atorvastatin (LIPITOR) 20 MG tablet Take 1 tablet (20 mg total) by mouth daily. 30 tablet 0  . Calcium Carb-Cholecalciferol (CALCIUM 600/VITAMIN D3 PO) Take 1 tablet by mouth daily.    [name] diltiazem (CARDIZEM CD) 120 MG 24 hr capsule Take 1 capsule (120 mg total) by mouth daily. 30 capsule 3  . furosemide (LASIX) 20 MG tablet Take 1 tablet (20 mg total) by mouth daily as needed (edema in the leg with blisters, weight gain of 3lbs in 1 day or 5lbs in 2 days.). 30 tablet 2  . hydrocortisone cream 0.5 % Apply topically 3 (three) times daily. 30 g 0  . Lidocaine (BLUE-EMU PAIN RELIEF DRY EX) Apply 1 application topically in the morning and at bedtime. Both shoulders and both knees    . Multiple Vitamins-Minerals (ONE-A-DAY 50 PLUS PO) Take 1 tablet by mouth daily.     . Multiple Vitamins-Minerals (PRESERVISION AREDS 2 PO) Take 1 capsule by mouth in the morning and at bedtime.    . Potassium Chloride ER 20 MEQ TBCR Take 20 mEq by mouth daily as needed (take when you take lasix.). 20 tablet 2   No current facility-administered medications for this encounter.    Allergies  Allergen Reactions  . Tetanus-Diphtheria Toxoids Td Other (See Comments)    Felt very sick    Social History   Socioeconomic History  . Marital status: Married    Spouse name:  Not on file  . Number of children: Not on file  . Years of education: Not on file  . Highest education level: Not on file  Occupational History  . Not on file  Tobacco Use  . Smoking status: Never Smoker  . Smokeless tobacco: Never Used  Substance and Sexual Activity  . Alcohol use: Not on file  . Drug use: Not on file  . Sexual activity: Not Currently  Other Topics Concern  . Not on file  Social History Narrative  . Not on file   Social Determinants of Health   Financial Resource Strain:   . Difficulty of Paying Living Expenses:   Food Insecurity:   . Worried About Charity fundraiser in the Last Year:   . Youth worker in the Last Year:   Transportation Needs:   . Film/video editor (Medical):   [name] Lack of Transportation (Non-Medical):   Physical Activity:   . Days of Exercise per Week:   . Minutes of Exercise per Session:   Stress:   . Feeling of Stress :   Social Connections:   . Frequency of Communication with Friends and Family:   . Frequency of Social Gatherings with Friends and Family:   . Attends Religious Services:   . Active Member of Clubs or Organizations:   . Attends Archivist Meetings:   [name] Marital Status:   Intimate Partner Violence:   . Fear of Current or Ex-Partner:   . Emotionally Abused:   [name] Physically Abused:   . Sexually Abused:     Family History  Problem Relation Age of Onset  . Hypertension Mother   . Hypertension Father   . Diabetes Paternal Grandmother     ROS- All systems are reviewed and negative except as per the HPI above  Physical Exam: Vitals:   06/23/20 1448  BP: 124/70  Pulse: 99  Weight: 112.9 kg  Height: 5\' 6"  (1.676 m)   Wt Readings from Last 3 Encounters:  06/23/20 112.9 kg  06/11/20 112.3 kg  06/01/20 111.8 kg    Labs: Lab Results  Component Value Date   NA 139 06/04/2020   K 3.6 06/04/2020   CL 109 06/04/2020   CO2 25 06/04/2020   GLUCOSE 106 (H) 06/04/2020   BUN 12 06/04/2020   CREATININE 0.77 06/04/2020   CALCIUM 8.4 (L) 06/04/2020   PHOS 2.9 06/02/2020   MG 2.2 06/04/2020   No results found for: INR Lab Results  Component Value Date   CHOL 184 06/01/2020   HDL 51 06/01/2020   LDLCALC 120 (H) 06/01/2020   TRIG 67 06/01/2020     GEN- The patient is well appearing, alert and oriented x 3 today.   Head- normocephalic, atraumatic Eyes-  Sclera clear, conjunctiva pink Ears- hearing intact Oropharynx- clear Neck- supple, no JVP Lymph- no cervical lymphadenopathy Lungs- Clear to ausculation bilaterally, normal work of breathing Heart- irregular rate and rhythm, no murmurs, rubs or gallops, PMI not  laterally displaced GI- soft, NT, ND, + BS Extremities- no clubbing, cyanosis, or  Trace edema on left, 1+ on rt  MS- no significant deformity or atrophy Skin- no rash or lesion Psych- euthymic mood, full affect Neuro- strength and sensation are intact  EKG-afib at 99  pbm, qrs int 86 ms, qtc 459  bms   LHC- The left ventricular systolic function is normal.  LV end diastolic pressure is normal.  The left ventricular ejection fraction is 50-55% by  visual estimate.  There is no aortic valve stenosis.  Minimal, nobstructive CAD. Tortuous cororany arteries.   Echo-1. Left ventricular ejection fraction, by estimation, is 55 to 60%. The  left ventricle has normal function. The left ventricle has no regional  wall motion abnormalities. There is mild left ventricular hypertrophy.  Left ventricular diastolic parameters  are indeterminate.  2. Right ventricular systolic function is mildly reduced. The right  ventricular size is normal. There is normal pulmonary artery systolic  pressure. The estimated right ventricular systolic pressure is 08.0 mmHg.  3. Left atrial size was mildly dilated.  4. The mitral valve is abnormal. Trivial mitral valve regurgitation. No  evidence of mitral stenosis.  5. The aortic valve is abnormal. Aortic valve regurgitation is trivial.  Moderate aortic valve stenosis. Aortic valve mean gradient measures 20.0  mmHg.  6. The inferior vena cava is normal in size with greater than 50%  respiratory variability, suggesting right atrial pressure of 3 mmHg.   Assessment and Plan: 1. New onset persistent afib Rate controlled  Pt has cut back on caffeine use Regular exercise/ weight loss encouraged  Continue 120 mg Cardizem daily    2. CHA2DS2VASc score of  at least 5  Continue eliquis 5 mg bid  If after 21 days of uninterrupted  anticoagulation , will plan on cardioversion, if remains in afib Will count 6/18 as first full day, eligible after 7/9.    Scheduled  for cardioversion  7/16, reminded not to miss any anticoagulation  Bmet/mag covid test scheduled  Has had both vaccines  Recheck here in 7-10 days after cardioversion   Butch Penny C. Javia Dillow, [facility] [address] [place], [place] [address] [phone]

## 2020-06-23 NOTE — Patient Instructions (Signed)
Cardioversion scheduled for Friday, July 16th  - Arrive at the [facility] and go to admitting at 930AM  - Do not eat or drink anything after midnight the night prior to your procedure.  - Take all your morning medication (except diabetic medications) with a sip of water prior to arrival.  - You will not be able to drive home after your procedure.  - Do NOT miss any doses of your blood thinner - if you should miss a dose please notify our office immediately.

## 2020-06-29 ENCOUNTER — Ambulatory Visit: Payer: Medicare Other | Admitting: Adult Health

## 2020-06-30 ENCOUNTER — Ambulatory Visit: Payer: Medicare Other | Admitting: Adult Health

## 2020-06-30 ENCOUNTER — Other Ambulatory Visit (HOSPITAL_COMMUNITY)
Admission: RE | Admit: 2020-06-30 | Discharge: 2020-06-30 | Disposition: A | Discharge status: Home / Self Care | Payer: Medicare Other | Source: Ambulatory Visit | Attending: Internal Medicine | Admitting: Internal Medicine

## 2020-06-30 DIAGNOSIS — Z01812 Encounter for preprocedural laboratory examination: Secondary | ICD-10-CM | POA: Diagnosis present

## 2020-06-30 DIAGNOSIS — Z20822 Contact with and (suspected) exposure to covid-19: Secondary | ICD-10-CM | POA: Insufficient documentation

## 2020-06-30 LAB — SARS CORONAVIRUS 2 (TAT 6-24 HRS): SARS Coronavirus 2: NEGATIVE

## 2020-07-01 ENCOUNTER — Telehealth (HOSPITAL_COMMUNITY): Payer: Self-pay | Admitting: *Deleted

## 2020-07-01 NOTE — Progress Notes (Signed)
Pre call complete for endo procedure tomorrow 07/02/20. Patient states that she has been quarantined since covid test, she will be NPO after midnight, has a ride arranged for post procedure, and will be showing up at 0930 am. She confirmed she has not missed any doses of her blood thinner and will take in am before arrival to hospital.  All questions addressed.

## 2020-07-01 NOTE — Telephone Encounter (Signed)
Pt called in stating she noticed blood in her urine this morning. Instructed pt to call and be seen by PCP -- urine did not show infection but did show blood in urine. PCP is covering with antibiotics until culture results. Per Roderic Palau NP will postpone cardioversion scheduled for tomorrow until workup of hematuria completed. PT in agreement. Cardioversion canceled.

## 2020-07-02 ENCOUNTER — Ambulatory Visit (HOSPITAL_COMMUNITY): Admission: RE | Admit: 2020-07-02 | Payer: Medicare Other | Source: Home / Self Care | Admitting: Internal Medicine

## 2020-07-02 ENCOUNTER — Encounter (HOSPITAL_COMMUNITY): Admission: RE | Payer: Self-pay | Source: Home / Self Care

## 2020-07-02 SURGERY — CARDIOVERSION
Anesthesia: General

## 2020-07-09 ENCOUNTER — Ambulatory Visit (HOSPITAL_COMMUNITY)
Admission: RE | Admit: 2020-07-09 | Discharge: 2020-07-09 | Disposition: A | Discharge status: Home / Self Care | Payer: Medicare Other | Source: Ambulatory Visit | Attending: Nurse Practitioner | Admitting: Nurse Practitioner

## 2020-07-09 ENCOUNTER — Other Ambulatory Visit (HOSPITAL_COMMUNITY): Payer: Self-pay | Admitting: Nurse Practitioner

## 2020-07-09 ENCOUNTER — Encounter (HOSPITAL_COMMUNITY): Payer: Self-pay | Admitting: Nurse Practitioner

## 2020-07-09 ENCOUNTER — Other Ambulatory Visit: Payer: Self-pay

## 2020-07-09 VITALS — BP 136/80 | HR 104 | Ht 66.0 in | Wt 246.8 lb

## 2020-07-09 DIAGNOSIS — I4819 Other persistent atrial fibrillation: Secondary | ICD-10-CM | POA: Diagnosis present

## 2020-07-09 DIAGNOSIS — Z7984 Long term (current) use of oral hypoglycemic drugs: Secondary | ICD-10-CM | POA: Insufficient documentation

## 2020-07-09 DIAGNOSIS — Z7901 Long term (current) use of anticoagulants: Secondary | ICD-10-CM | POA: Diagnosis not present

## 2020-07-09 DIAGNOSIS — E119 Type 2 diabetes mellitus without complications: Secondary | ICD-10-CM | POA: Diagnosis not present

## 2020-07-09 DIAGNOSIS — D6869 Other thrombophilia: Secondary | ICD-10-CM | POA: Diagnosis not present

## 2020-07-09 DIAGNOSIS — I4891 Unspecified atrial fibrillation: Secondary | ICD-10-CM | POA: Diagnosis not present

## 2020-07-09 DIAGNOSIS — I10 Essential (primary) hypertension: Secondary | ICD-10-CM | POA: Insufficient documentation

## 2020-07-09 DIAGNOSIS — M1990 Unspecified osteoarthritis, unspecified site: Secondary | ICD-10-CM | POA: Insufficient documentation

## 2020-07-09 LAB — BASIC METABOLIC PANEL
Anion gap: 12 (ref 5–15)
BUN: 13 mg/dL (ref 8–23)
CO2: 22 mmol/L (ref 22–32)
Calcium: 9.2 mg/dL (ref 8.9–10.3)
Chloride: 108 mmol/L (ref 98–111)
Creatinine, Ser: 0.74 mg/dL (ref 0.44–1.00)
GFR calc Af Amer: >60 mL/min (ref >60)
GFR calc non Af Amer: >60 mL/min (ref >60)
Glucose, Bld: 123 mg/dL — ABNORMAL HIGH (ref 70–99)
Potassium: 3.4 mmol/L — ABNORMAL LOW (ref 3.5–5.1)
Sodium: 142 mmol/L (ref 135–145)

## 2020-07-09 LAB — CBC
HCT: 38.6 % (ref 36.0–46.0)
Hemoglobin: 12.1 g/dL (ref 12.0–15.0)
MCH: 28.3 pg (ref 26.0–34.0)
MCHC: 31.3 g/dL (ref 30.0–36.0)
MCV: 90.2 fL (ref 80.0–100.0)
Platelets: 266 10*3/uL (ref 150–400)
RBC: 4.28 MIL/uL (ref 3.87–5.11)
RDW: 15.3 % (ref 11.5–15.5)
WBC: 9.5 10*3/uL (ref 4.0–10.5)
nRBC: 0 % (ref 0.0–0.2)

## 2020-07-09 NOTE — Progress Notes (Signed)
Primary Care Physician: Glenis Smoker, MD Referring Physician:Acharya,Gayatri , MD   [name] [name] is a 80 y.o. female with a h/o HTN, DM, that presented tp [facility] ED with chest pain and found to have new onset afib. She was admitted and had an echo that was unremarkable and a Left heart cath that showed mimimal nonobstructive CAD. She was discharfed in afib on diltiazem 120 mg qd and eliquis 5 mg bid for a CHA2DS2VASc score of 5.  In the afib clinic she remains in afib with RVR. Overall she feels ok.No  further chest pain. She does not drink alcohol, drinks several diet Pepsi's a day, no tobacco and denies snoring.  She does have intermittent LLE, Rt > Lt and was d/c on lasix as needed.   F/u in afib clinic 7/7. She is now approaching the full 3 week requirement of DOAC for  cardioversion. She  has chronic lower extremity edema which preceded afib but has required more lasix since in afib. She is rate controlled.   F/u in afib clinic 7/23. Unfortunately, DCCV was cancelled as pt started having hematuria the day before so it was cancelled until it could be determined source of bleeding as her U/A did not show any infection. She took an round of antibiotics and has not seen any more bleeding. DCCV will be rescheduled.She continues in rate controlled afib.   Today, she denies symptoms of palpitations, chest pain, shortness of breath, orthopnea, PND, lower extremity edema, dizziness, presyncope, syncope, or neurologic sequela. The patient is tolerating medications without difficulties and is otherwise without complaint today.   Past Medical History:  Diagnosis Date  . Anemia   . Arthritis   . Diabetes mellitus without complication ([place])   . Hypertension    Past Surgical History:  Procedure Laterality Date  . ABDOMINAL HYSTERECTOMY    . CHOLECYSTECTOMY    . LEFT HEART CATH AND CORONARY ANGIOGRAPHY N/A 06/02/2020   Procedure: LEFT HEART CATH AND CORONARY ANGIOGRAPHY;  Surgeon: Jettie Booze, MD;  Location: [place] [facility];  Service: Cardiovascular;  Laterality: N/A;    Current Outpatient Medications  Medication Sig Dispense Refill  . acetaminophen (TYLENOL) 500 MG tablet 1 tablet as needed    . apixaban (ELIQUIS) 5 MG TABS tablet Take 1 tablet (5 mg total) by mouth 2 (two) times daily. 60 tablet 3  . Calcium Carb-Cholecalciferol (CALCIUM 600/VITAMIN D3 PO) Take 1 tablet by mouth daily.    [name] diltiazem (CARDIZEM CD) 120 MG 24 hr capsule Take 1 capsule (120 mg total) by mouth daily. 30 capsule 3  . hydrocortisone cream 0.5 % Apply topically 3 (three) times daily. (Patient taking differently: Apply 1 application topically 2 (two) times daily. ) 30 g 0  . Liniments (BLUE-EMU SUPER STRENGTH) CREA Apply 1 application topically 2 (two) times daily.    . Loperamide HCl (IMODIUM A-D PO) Take 1 tablet by mouth as needed.    . Multiple Vitamins-Minerals (ONE-A-DAY 50 PLUS PO) Take 1 tablet by mouth daily.     . Multiple Vitamins-Minerals (PRESERVISION AREDS 2 PO) Take 1 capsule by mouth 2 (two) times daily.     [name] atorvastatin (LIPITOR) 20 MG tablet Take 1 tablet (20 mg total) by mouth daily. (Patient not taking: Reported on 07/09/2020) 30 tablet 0  . furosemide (LASIX) 20 MG tablet Take 1 tablet (20 mg total) by mouth daily as needed (edema in the leg with blisters, weight gain of 3lbs in 1 day or  5lbs in 2 days.). (Patient not taking: Reported on 07/09/2020) 30 tablet 2  . Potassium Chloride ER 20 MEQ TBCR Take 20 mEq by mouth daily as needed (take when you take lasix.). (Patient not taking: Reported on 07/09/2020) 20 tablet 2   No current facility-administered medications for this encounter.    Allergies  Allergen Reactions  . Tetanus-Diphtheria Toxoids Td Other (See Comments)    Felt very sick    Social History   Socioeconomic History  . Marital status: Married    Spouse name: Not on file  . Number of children: Not on file  . Years of education: Not on file  .  Highest education level: Not on file  Occupational History  . Not on file  Tobacco Use  . Smoking status: Never Smoker  . Smokeless tobacco: Never Used  Substance and Sexual Activity  . Alcohol use: Not on file  . Drug use: Not on file  . Sexual activity: Not Currently  Other Topics Concern  . Not on file  Social History Narrative  . Not on file   Social Determinants of Health   Financial Resource Strain:   . Difficulty of Paying Living Expenses:   Food Insecurity:   . Worried About Charity fundraiser in the Last Year:   . Arboriculturist in the Last Year:   Transportation Needs:   . Film/video editor (Medical):   [name] Lack of Transportation (Non-Medical):   Physical Activity:   . Days of Exercise per Week:   . Minutes of Exercise per Session:   Stress:   . Feeling of Stress :   Social Connections:   . Frequency of Communication with Friends and Family:   . Frequency of Social Gatherings with Friends and Family:   . Attends Religious Services:   . Active Member of Clubs or Organizations:   . Attends Archivist Meetings:   [name] Marital Status:   Intimate Partner Violence:   . Fear of Current or Ex-Partner:   . Emotionally Abused:   [name] Physically Abused:   . Sexually Abused:     Family History  Problem Relation Age of Onset  . Hypertension Mother   . Hypertension Father   . Diabetes Paternal Grandmother     ROS- All systems are reviewed and negative except as per the HPI above  Physical Exam: Vitals:   07/09/20 1158  Height: 5\' 6"  (1.676 m)   Wt Readings from Last 3 Encounters:  06/23/20 112.9 kg  06/11/20 112.3 kg  06/01/20 111.8 kg    Labs: Lab Results  Component Value Date   NA 142 06/23/2020   K 4.1 06/23/2020   CL 107 06/23/2020   CO2 25 06/23/2020   GLUCOSE 110 (H) 06/23/2020   BUN 14 06/23/2020   CREATININE 0.85 06/23/2020   CALCIUM 8.9 06/23/2020   PHOS 2.9 06/02/2020   MG 2.2 06/04/2020   No results found for: INR Lab  Results  Component Value Date   CHOL 184 06/01/2020   HDL 51 06/01/2020   LDLCALC 120 (H) 06/01/2020   TRIG 67 06/01/2020     GEN- The patient is well appearing, alert and oriented x 3 today.   Head- normocephalic, atraumatic Eyes-  Sclera clear, conjunctiva pink Ears- hearing intact Oropharynx- clear Neck- supple, no JVP Lymph- no cervical lymphadenopathy Lungs- Clear to ausculation bilaterally, normal work of breathing Heart- irregular rate and rhythm, no murmurs, rubs or gallops, PMI not laterally displaced GI-  soft, NT, ND, + BS Extremities- no clubbing, cyanosis, 2+ edema with statis dermatitis  MS- no significant deformity or atrophy Skin- no rash or lesion Psych- euthymic mood, full affect Neuro- strength and sensation are intact  EKG-afib at 104   pbm, qrs int 86 ms, qtc 459  bms   LHC- The left ventricular systolic function is normal.  LV end diastolic pressure is normal.  The left ventricular ejection fraction is 50-55% by visual estimate.  There is no aortic valve stenosis.  Minimal, nobstructive CAD. Tortuous cororany arteries.   Echo-1. Left ventricular ejection fraction, by estimation, is 55 to 60%. The  left ventricle has normal function. The left ventricle has no regional  wall motion abnormalities. There is mild left ventricular hypertrophy.  Left ventricular diastolic parameters  are indeterminate.  2. Right ventricular systolic function is mildly reduced. The right  ventricular size is normal. There is normal pulmonary artery systolic  pressure. The estimated right ventricular systolic pressure is 46.2 mmHg.  3. Left atrial size was mildly dilated.  4. The mitral valve is abnormal. Trivial mitral valve regurgitation. No  evidence of mitral stenosis.  5. The aortic valve is abnormal. Aortic valve regurgitation is trivial.  Moderate aortic valve stenosis. Aortic valve mean gradient measures 20.0  mmHg.  6. The inferior vena cava is normal in  size with greater than 50%  respiratory variability, suggesting right atrial pressure of 3 mmHg.   Assessment and Plan: 1. New onset persistent afib Rate controlled,  DCCV cancelled when scheduled last week for new onset hematuria  she has not seen in bleeding ofr the last 2 days Will reschedule  Continue 120 mg Cardizem daily    2. CHA2DS2VASc score of  at least 5  Continue eliquis 5 mg bid  States no missed doses of anticoagulation  6/18 was first full day of anticoagulation, eligible after 7/9.  Scheduled  for cardioversion  8/2, no missed doses Bmet/mag covid test scheduled  Has had both vaccines  2. LLE Pt takes lasix intermittently, has not taken lately recently Encouraged to take 20 mg for the next 3 days     Recheck here in 7-10 days after cardioversion   Butch Penny C. Ruairi Stutsman, [facility] [address] [place], [place] [address] [phone]

## 2020-07-09 NOTE — Patient Instructions (Signed)
Cardioversion scheduled for August 2nd 2021  - Arrive at the [facility] and go to admitting at 10:30am  - Do not eat or drink anything after midnight the night prior to your procedure.  - Take all your morning medication (except diabetic medications) with a sip of water prior to arrival.  - You will not be able to drive home after your procedure.  - Do NOT miss any doses of your blood thinner - if you should miss a dose please notify our office immediately.

## 2020-07-14 ENCOUNTER — Telehealth (HOSPITAL_COMMUNITY): Payer: Self-pay | Admitting: Nurse Practitioner

## 2020-07-14 NOTE — Telephone Encounter (Signed)
Pt c/o medication issue:  1. Name of Medication:  apixaban (ELIQUIS) 5 MG TABS tablet diltiazem (CARDIZEM CD) 120 MG 24 hr capsule furosemide (LASIX) 20 MG tablet Potassium Chloride ER 20 MEQ TBCR   2. How are you currently taking this medication (dosage and times per day)? As directed   3. Are you having a reaction (difficulty breathing--STAT)? no  4. What is your medication issue? Patient saw her PCP Sela Hilding today. The PCP called and states the patient came in to their office and had a rash on both arms and legs that has been present for ~ a month. The PCP feels that the rash may be a drug interaction rash because it started after she was put on these medications in the hospital. The PCP would like the patient to be seen by a provider ASAP. The PCP asks that we please contact the patient as well

## 2020-07-14 NOTE — Telephone Encounter (Signed)
Pt reports blood has returned to urine and she missed a dose of eliquis on Sunday. Will cancel cardioversion until further workup of hematuria is completed. She saw her PCP today - states MD will be calling to talk with Butch Penny today - will await discussion prior to addressing possible medication changes due to rash.

## 2020-07-15 MED ORDER — METOPROLOL TARTRATE 25 MG PO TABS
25.0000 mg | ORAL_TABLET | Freq: Two times a day (BID) | ORAL | 3 refills | Status: DC
Start: 2020-07-15 — End: 2020-09-30

## 2020-07-15 NOTE — Telephone Encounter (Signed)
Per Roderic Palau will switch cardizem to metoprolol tartrate 25mg  BID to see if improvement in rash. Pt to call in 1 week with update of symptom. Has not heard back from PCP regarding urine tests from yesterday.

## 2020-07-16 ENCOUNTER — Other Ambulatory Visit (HOSPITAL_COMMUNITY): Payer: Medicare Other

## 2020-07-19 ENCOUNTER — Ambulatory Visit (HOSPITAL_COMMUNITY): Admit: 2020-07-19 | Payer: Medicare Other | Admitting: Cardiology

## 2020-07-19 ENCOUNTER — Encounter (HOSPITAL_COMMUNITY): Payer: Self-pay

## 2020-07-19 SURGERY — CARDIOVERSION
Anesthesia: General

## 2020-07-23 NOTE — Telephone Encounter (Signed)
Patient called with update -- rash is completely gone with change to metoprolol which she is tolerating well. She continues have hematuria but no infection is noted - her PCP is referring her to urology for further workup. She is still awaiting call with appt for this - she will call office once hematuria is worked up so that afib can be addressed. Pt in agreement with this plan.

## 2020-07-27 ENCOUNTER — Ambulatory Visit (HOSPITAL_COMMUNITY): Payer: Medicare Other | Admitting: Nurse Practitioner

## 2020-08-05 ENCOUNTER — Telehealth (HOSPITAL_COMMUNITY): Payer: Self-pay | Admitting: *Deleted

## 2020-08-05 ENCOUNTER — Other Ambulatory Visit: Payer: Self-pay | Admitting: Urology

## 2020-08-05 NOTE — Telephone Encounter (Signed)
Pam with [facility] called stating cardiac clearance needed for upcoming surgery on 08/19/20.  CYSTOSCOPY LEFT RETROGRADE URETEROSCOPY/HOLMIUM LASER/STENT PLACEMENT  Ok to continue Eliquis per Dr. Jeffie Pollock - need cardiac clearance - Pam can be reached at [phone] X [phone]

## 2020-08-08 NOTE — Telephone Encounter (Signed)
Please verify the type of anesthesia or sedation to complete this cardiac clearance

## 2020-08-09 NOTE — Telephone Encounter (Signed)
    Pam from [facility] called back, anesthesia type to use for pt's surgery is General

## 2020-08-09 NOTE — Telephone Encounter (Signed)
Left msg for Pam with [facility] to call the office with type of anesthesia or sedation.

## 2020-08-09 NOTE — Telephone Encounter (Signed)
Fax number for clearance: [phone]

## 2020-08-09 NOTE — Telephone Encounter (Signed)
Need a fax number- there is no official clearance form.  Kerin Ransom PA-C 08/09/2020 10:50 AM

## 2020-08-09 NOTE — Telephone Encounter (Signed)
   Primary Cardiologist: Elouise Munroe, MD  Chart reviewed and patient contacted by phone today as part of pre-operative protocol coverage. Given past medical history and time since last visit, based on ACC/AHA guidelines, HALIEGH KHURANA would be at acceptable risk for the planned procedure without further cardiovascular testing.   I will route this recommendation to the requesting party via Epic fax function and remove from pre-op pool.  Please call with questions.  Kerin Ransom, PA-C 08/09/2020, 11:51 AM

## 2020-08-13 ENCOUNTER — Encounter (HOSPITAL_BASED_OUTPATIENT_CLINIC_OR_DEPARTMENT_OTHER): Payer: Self-pay | Admitting: Urology

## 2020-08-13 ENCOUNTER — Other Ambulatory Visit: Payer: Self-pay | Admitting: Urology

## 2020-08-13 ENCOUNTER — Other Ambulatory Visit: Payer: Self-pay

## 2020-08-13 NOTE — Progress Notes (Addendum)
[name] Felix pa spoke with dr Smith Robert on 08-12-2020 and patient is ok for lwsc per dr Smith Robert.  Spoke w/ via phone for pre-op interview---PT Lab needs dos----  I stat 8             COVID test ------08-16-2020 1100 am Arrive at -------600 am 08-19-2020 NPO after MN NO Solid Food.  Clear liquids from MN until---500 am then npo Medications to take morning of surgery ----metoprolol tarrate- Diabetic medication -----n/a Patient Special Instructions -----pt to stay eliquis per dr Jeffie Pollock last dose pm day before surgery Pre-Op special Istructions -----none Patient verbalized understanding of instructions that were given at this phone interview. Patient denies shortness of breath, chest pain, fever, cough at this phone interview.  Anesthesia Review: hx afib since June 2021, has instructions to stay on eliquis per dr Jeffie Pollock, has cardiac clearance Kerin Ransom pa 08-09-2020 epic  PCP:dr Sela Hilding Cardiologist :Roderic Palau np afib clinic lov 07-09-2020 epic/chart, cardiac clearance note Kerin Ransom pa 08-09-2020 epic/chart Chest x-ray :06-01-2020 epic EKG :07-09-2020 epic Echo :06-03-2020 epic/chart Stress test:none Cardiac Cath : 06-02-2020 [facility] Activity level: can climb stairs and does housework without problems Sleep Study/ CPAP :n/a Fasting Blood Sugar :      / Checks Blood Sugar -- times a day:n/a   Blood Thinner/ Instructions /Last Dose: pt to stay on eliquis per dr Jeffie Pollock,  ASA / Instructions/ Last Dose : n/a  Wedding ring stuck left 4th finger pt to try and remove

## 2020-08-16 ENCOUNTER — Other Ambulatory Visit (HOSPITAL_COMMUNITY)
Admission: RE | Admit: 2020-08-16 | Discharge: 2020-08-16 | Disposition: A | Discharge status: Home / Self Care | Payer: Medicare Other | Source: Ambulatory Visit | Attending: Urology | Admitting: Urology

## 2020-08-16 ENCOUNTER — Other Ambulatory Visit (HOSPITAL_COMMUNITY): Payer: Self-pay | Admitting: Nurse Practitioner

## 2020-08-16 DIAGNOSIS — Z01812 Encounter for preprocedural laboratory examination: Secondary | ICD-10-CM | POA: Diagnosis present

## 2020-08-16 DIAGNOSIS — Z20822 Contact with and (suspected) exposure to covid-19: Secondary | ICD-10-CM | POA: Diagnosis not present

## 2020-08-16 LAB — SARS CORONAVIRUS 2 (TAT 6-24 HRS): SARS Coronavirus 2: NEGATIVE

## 2020-08-18 NOTE — H&P (Signed)
I have blood in my urine.     [name] [name] is a 79 yo female who is sent in consultation by Dr. Lindell Noe for intermittent gross hematuria that began on 07/01/20. She was on an anticoagulant at that time in preparation for a cardioversion. She remains on Eliquis. She last saw blood this morning. She has had no clots. She has had no pain. She has had no frequency or urgency. She had a negative culture in Dr. Rosario Jacks office. Her Cr is normal. She had 3 stones remotely that she passed. She has had no GU surgery. She has had rare UTI's remotely. She has some mild incontinence with standing and a full bladder.     ALLERGIES: Cardizem - Skin Rash    MEDICATIONS: Metoprolol Tartrate 25 mg tablet  Calcium + D3  Eliquis 5 mg tablet  Lasix 20 mg tablet  Multivitamin  Potassium Chloride 20 meq tablet, extended release  Preservision Areds 2  Tylenol     GU PSH: Hysterectomy     NON-GU PSH: Carpal Tunnel Surgery.., Right Cholecystectomy (laparoscopic) Repair Hammertoe, Right Tonsillectomy     GU PMH: Renal calculus      PMH Notes: ? MI one side of heart enlarged   NON-GU PMH: Arthritis Atrial Fibrillation Diabetes Type 2 GERD Hypercholesterolemia Hypertension    FAMILY HISTORY: blood clots - Mother Diabetes - Grandfather Hypertension - Mother, Father Parkinson's Disease - Father Stroke Syndrome - Father   SOCIAL HISTORY: Marital Status: Married Preferred Language: English; Race: White Current Smoking Status: Patient has never smoked.   Tobacco Use Assessment Completed: Used Tobacco in last 30 days? Has never drank.  Does not drink caffeine. 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     Notes: 3 sons 2 daughters G15P5 NVD5   REVIEW OF SYSTEMS:    GU Review Female:   Patient reports leakage of urine. Patient denies frequent urination, hard to postpone urination, burning /pain with urination, get up at night to urinate, stream starts and stops, trouble starting your stream,  have to strain to urinate, and being pregnant.  Gastrointestinal (Upper):   Patient denies nausea, vomiting, and indigestion/ heartburn.  Gastrointestinal (Lower):   Patient reports diarrhea. Patient denies constipation.  Constitutional:   Patient reports night sweats and fatigue. Patient denies fever and weight loss.  Skin:   Patient reports skin rash/ lesion and itching.   Eyes:   Patient reports blurred vision. Patient denies double vision.  Ears/ Nose/ Throat:   Patient denies sore throat and sinus problems.  Hematologic/Lymphatic:   Patient denies swollen glands and easy bruising.  Cardiovascular:   Patient reports leg swelling and chest pains.   Respiratory:   Patient reports cough and shortness of breath.   Endocrine:   Patient denies excessive thirst.  Musculoskeletal:   Patient denies back pain and joint pain.  Neurological:   Patient reports dizziness. Patient denies headaches.  Psychologic:   Patient denies depression and anxiety.   VITAL SIGNS:      08/05/2020 09:12 AM  Weight 244 lb / 110.68 kg  Height 66 in / 167.64 cm  BP 138/81 mmHg  Heart Rate 108 /min  Temperature 97.0 F / 36.1 C  BMI 39.4 kg/m   MULTI-SYSTEM PHYSICAL EXAMINATION:    Constitutional: Obese. No physical deformities. Normally developed. Good grooming.   Neck: Neck symmetrical, not swollen. Normal tracheal position.  Respiratory: Normal breath sounds. No labored breathing, no use of accessory muscles.   Cardiovascular: Regular rate and rhythm. No murmur, no gallop.  Extremity swelling. Normal temperature, , no varicosities.   Lymphatic: No enlargement of neck, axillae, groin.  Skin: No paleness, no jaundice, no cyanosis. No lesion, no ulcer, no rash.  Neurologic / Psychiatric: Oriented to time, oriented to place, oriented to person. No depression, no anxiety, no agitation.  Gastrointestinal: Obese abdomen. No mass, no tenderness, no rigidity.   Musculoskeletal: Normal gait and station of head and neck.      Complexity of Data:  Lab Test Review:   CMP  Records Review:   Previous Doctor Records  Urine Test Review:   Urinalysis, Urine Culture  X-Ray Review: C.T. Hematuria: Reviewed Films. Discussed With Patient. 2.5cm LLP mass and 48mm LUVJ stone without obstruction.   Notes:                     Cr was 0.74 on 7/23.    PROCEDURES:         C.T. Hematuria - 74178, V6160      OMNIPAQUE 300 125CC Patient confirmed No Neulasta OnPro Device.         Urinalysis w/Scope - 81001 Dipstick Dipstick Cont'd Micro  Color: Red Bilirubin: Neg mg/dL WBC/hpf: NS (Not Seen)  Appearance: Cloudy Ketones: Neg mg/dL RBC/hpf: 20 - 40/hpf  Specific Gravity: 1.015 Blood: 3+ ery/uL Bacteria: Rare (0-9/hpf)  pH: 5.5 Protein: Neg mg/dL Cystals: NS (Not Seen)  Glucose: Neg mg/dL Urobilinogen: 0.2 mg/dL Casts: Hyaline    Nitrites: Neg Trichomonas: Not Present    Leukocyte Esterase: Trace leu/uL Mucous: Present      Epithelial Cells: 0 - 5/hpf      Yeast: NS (Not Seen)      Sperm: Not Present    Notes: Microscopic not concentrated.    ASSESSMENT:      ICD-10 Details  1 GU:   Gross hematuria - R31.0 Undiagnosed New Problem - The bleeding appears to be secondary to a non-obstructing left distal stone.   2   Ureteral calculus - N20.1 Acute, Systemic Symptoms - She has a left distal stone without obstruction. This is the probable cause of the bleeding and needs to be managed with ureteroscopy. I have reviewed the risks of ureteroscopy including bleeding, infection, ureteral injury, need for a stent or secondary procedures, thrombotic events and anesthetic complications. She can remain on Eliquis.   3   Renal calculus - N20.0 Minor - She has small bilateral lower pole stones.   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 This will need further evaluation and possible treatment but I believe  this will be best accomplished after she has been through cardioversion. I don't think this is the source of the hematuria.    PLAN:           Orders X-Rays: C.T. Hematuria With and Without I.V. Contrast - Today if possible and if not then in the next 2-3 days. Cr was 0.74 on 07/09/20.   X-Ray Notes: History:  Hematuria: Yes/No  Patient to see MD after exam: Yes/No  Previous exam: CT / IVP/ US/ KUB/ None  When:  Where:  Diabetic: Yes/ No  BUN/ Creatine:  Date of last BUN Creatinine:  Weight in pounds:  Allergy- Contrasts/ Shellfish: Yes/ No  Conflicting diabetic meds: Yes/ No  Oral contrast and instructions given to patient:   Prior Authorization #: NPCR ref # [id number]  Schedule Return Visit/Planned Activity: ASAP - Schedule Surgery             Note: Left ureteroscopy

## 2020-08-19 ENCOUNTER — Ambulatory Visit (HOSPITAL_BASED_OUTPATIENT_CLINIC_OR_DEPARTMENT_OTHER): Payer: Medicare Other | Admitting: Physician Assistant

## 2020-08-19 ENCOUNTER — Encounter (HOSPITAL_BASED_OUTPATIENT_CLINIC_OR_DEPARTMENT_OTHER): Payer: Self-pay | Admitting: Urology

## 2020-08-19 ENCOUNTER — Encounter (HOSPITAL_BASED_OUTPATIENT_CLINIC_OR_DEPARTMENT_OTHER)
Admission: RE | Disposition: A | Discharge status: Home / Self Care | Payer: Self-pay | Source: Home / Self Care | Attending: Urology

## 2020-08-19 ENCOUNTER — Ambulatory Visit (HOSPITAL_BASED_OUTPATIENT_CLINIC_OR_DEPARTMENT_OTHER)
Admission: RE | Admit: 2020-08-19 | Discharge: 2020-08-19 | Disposition: A | Discharge status: Home / Self Care | Payer: Medicare Other | Attending: Urology | Admitting: Urology

## 2020-08-19 DIAGNOSIS — Z79899 Other long term (current) drug therapy: Secondary | ICD-10-CM | POA: Insufficient documentation

## 2020-08-19 DIAGNOSIS — Z6839 Body mass index (BMI) 39.0-39.9, adult: Secondary | ICD-10-CM | POA: Insufficient documentation

## 2020-08-19 DIAGNOSIS — K219 Gastro-esophageal reflux disease without esophagitis: Secondary | ICD-10-CM | POA: Insufficient documentation

## 2020-08-19 DIAGNOSIS — M1990 Unspecified osteoarthritis, unspecified site: Secondary | ICD-10-CM | POA: Insufficient documentation

## 2020-08-19 DIAGNOSIS — Z7901 Long term (current) use of anticoagulants: Secondary | ICD-10-CM | POA: Insufficient documentation

## 2020-08-19 DIAGNOSIS — E039 Hypothyroidism, unspecified: Secondary | ICD-10-CM | POA: Insufficient documentation

## 2020-08-19 DIAGNOSIS — I2510 Atherosclerotic heart disease of native coronary artery without angina pectoris: Secondary | ICD-10-CM | POA: Diagnosis not present

## 2020-08-19 DIAGNOSIS — N201 Calculus of ureter: Secondary | ICD-10-CM | POA: Diagnosis not present

## 2020-08-19 DIAGNOSIS — I10 Essential (primary) hypertension: Secondary | ICD-10-CM | POA: Diagnosis not present

## 2020-08-19 DIAGNOSIS — E669 Obesity, unspecified: Secondary | ICD-10-CM | POA: Diagnosis not present

## 2020-08-19 DIAGNOSIS — E119 Type 2 diabetes mellitus without complications: Secondary | ICD-10-CM | POA: Diagnosis not present

## 2020-08-19 DIAGNOSIS — I4891 Unspecified atrial fibrillation: Secondary | ICD-10-CM | POA: Diagnosis not present

## 2020-08-19 DIAGNOSIS — R310 Gross hematuria: Secondary | ICD-10-CM | POA: Diagnosis not present

## 2020-08-19 HISTORY — DX: Personal history of other endocrine, nutritional and metabolic disease: Z86.39

## 2020-08-19 HISTORY — DX: Personal history of urinary calculi: Z87.442

## 2020-08-19 HISTORY — DX: Hematuria, unspecified: R31.9

## 2020-08-19 HISTORY — DX: Dyspnea, unspecified: R06.00

## 2020-08-19 HISTORY — DX: Gastro-esophageal reflux disease without esophagitis: K21.9

## 2020-08-19 HISTORY — PX: CYSTOSCOPY/URETEROSCOPY/HOLMIUM LASER/STENT PLACEMENT: SHX6546

## 2020-08-19 LAB — POCT I-STAT, CHEM 8
BUN: 16 mg/dL (ref 8–23)
Calcium, Ion: 1.17 mmol/L (ref 1.15–1.40)
Chloride: 109 mmol/L (ref 98–111)
Creatinine, Ser: 0.7 mg/dL (ref 0.44–1.00)
Glucose, Bld: 109 mg/dL — ABNORMAL HIGH (ref 70–99)
HCT: 36 % (ref 36.0–46.0)
Hemoglobin: 12.2 g/dL (ref 12.0–15.0)
Potassium: 3.6 mmol/L (ref 3.5–5.1)
Sodium: 144 mmol/L (ref 135–145)
TCO2: 22 mmol/L (ref 22–32)

## 2020-08-19 SURGERY — CYSTOSCOPY/URETEROSCOPY/HOLMIUM LASER/STENT PLACEMENT
Anesthesia: General | Site: Bladder | Laterality: Bilateral

## 2020-08-19 MED ORDER — FENTANYL CITRATE (PF) 100 MCG/2ML IJ SOLN
INTRAMUSCULAR | Status: DC | PRN
Start: 2020-08-19 — End: 2020-08-19
  Administered 2020-08-19 (×4): 25 ug via INTRAVENOUS

## 2020-08-19 MED ORDER — MORPHINE SULFATE (PF) 4 MG/ML IV SOLN: 2.0000 mg | INTRAVENOUS | Status: DC | PRN

## 2020-08-19 MED ORDER — CEFAZOLIN SODIUM-DEXTROSE 2-4 GM/100ML-% IV SOLN
2.0000 g | INTRAVENOUS | Status: AC
  Administered 2020-08-19: 2 g via INTRAVENOUS

## 2020-08-19 MED ORDER — LIDOCAINE 2% (20 MG/ML) 5 ML SYRINGE
INTRAMUSCULAR | Status: AC
  Filled 2020-08-19: qty 5

## 2020-08-19 MED ORDER — ACETAMINOPHEN 325 MG PO TABS: 650.0000 mg | ORAL_TABLET | ORAL | Status: DC | PRN

## 2020-08-19 MED ORDER — LACTATED RINGERS IV SOLN: INTRAVENOUS | Status: DC

## 2020-08-19 MED ORDER — SODIUM CHLORIDE 0.9 % IV SOLN: 250.0000 mL | INTRAVENOUS | Status: DC | PRN

## 2020-08-19 MED ORDER — ONDANSETRON HCL 4 MG/2ML IJ SOLN
INTRAMUSCULAR | Status: DC | PRN
  Administered 2020-08-19: 4 mg via INTRAVENOUS

## 2020-08-19 MED ORDER — LIDOCAINE 2% (20 MG/ML) 5 ML SYRINGE
INTRAMUSCULAR | Status: DC | PRN
  Administered 2020-08-19: 60 mg via INTRAVENOUS

## 2020-08-19 MED ORDER — PROPOFOL 500 MG/50ML IV EMUL
INTRAVENOUS | Status: AC
  Filled 2020-08-19: qty 50

## 2020-08-19 MED ORDER — HYDROCODONE-ACETAMINOPHEN 5-325 MG PO TABS
1.0000 | ORAL_TABLET | Freq: Four times a day (QID) | ORAL | 0 refills | Status: DC | PRN
Start: 2020-08-19 — End: 2020-09-14

## 2020-08-19 MED ORDER — FENTANYL CITRATE (PF) 100 MCG/2ML IJ SOLN
INTRAMUSCULAR | Status: AC
  Filled 2020-08-19: qty 2

## 2020-08-19 MED ORDER — IOHEXOL 300 MG/ML  SOLN
INTRAMUSCULAR | Status: DC | PRN
  Administered 2020-08-19: 12 mL via URETHRAL

## 2020-08-19 MED ORDER — EPHEDRINE SULFATE-NACL 50-0.9 MG/10ML-% IV SOSY
PREFILLED_SYRINGE | INTRAVENOUS | Status: DC | PRN
  Administered 2020-08-19 (×2): 10 mg via INTRAVENOUS

## 2020-08-19 MED ORDER — CEFAZOLIN SODIUM-DEXTROSE 2-4 GM/100ML-% IV SOLN
INTRAVENOUS | Status: AC
  Filled 2020-08-19: qty 100

## 2020-08-19 MED ORDER — FENTANYL CITRATE (PF) 100 MCG/2ML IJ SOLN: 25.0000 ug | INTRAMUSCULAR | Status: DC | PRN

## 2020-08-19 MED ORDER — ACETAMINOPHEN 500 MG PO TABS
ORAL_TABLET | ORAL | Status: AC
  Filled 2020-08-19: qty 2

## 2020-08-19 MED ORDER — OXYCODONE HCL 5 MG PO TABS: 5.0000 mg | ORAL_TABLET | ORAL | Status: DC | PRN

## 2020-08-19 MED ORDER — PHENYLEPHRINE 40 MCG/ML (10ML) SYRINGE FOR IV PUSH (FOR BLOOD PRESSURE SUPPORT)
PREFILLED_SYRINGE | INTRAVENOUS | Status: DC | PRN
  Administered 2020-08-19 (×2): 80 ug via INTRAVENOUS
  Administered 2020-08-19 (×2): 120 ug via INTRAVENOUS
  Administered 2020-08-19: 80 ug via INTRAVENOUS

## 2020-08-19 MED ORDER — DEXAMETHASONE SODIUM PHOSPHATE 10 MG/ML IJ SOLN
INTRAMUSCULAR | Status: DC | PRN
  Administered 2020-08-19: 10 mg via INTRAVENOUS

## 2020-08-19 MED ORDER — SODIUM CHLORIDE 0.9% FLUSH: 3.0000 mL | INTRAVENOUS | Status: DC | PRN

## 2020-08-19 MED ORDER — SODIUM CHLORIDE 0.9 % IR SOLN
Status: DC | PRN
  Administered 2020-08-19: 3000 mL via INTRAVESICAL

## 2020-08-19 MED ORDER — DEXAMETHASONE SODIUM PHOSPHATE 10 MG/ML IJ SOLN
INTRAMUSCULAR | Status: AC
  Filled 2020-08-19: qty 1

## 2020-08-19 MED ORDER — NYSTATIN 100000 UNIT/GM EX POWD: 1.0000 "application " | Freq: Three times a day (TID) | CUTANEOUS | 3 refills | Status: DC

## 2020-08-19 MED ORDER — SODIUM CHLORIDE 0.9% FLUSH: 3.0000 mL | Freq: Two times a day (BID) | INTRAVENOUS | Status: DC

## 2020-08-19 MED ORDER — PROPOFOL 10 MG/ML IV BOLUS
INTRAVENOUS | Status: DC | PRN
  Administered 2020-08-19: 120 mg via INTRAVENOUS

## 2020-08-19 MED ORDER — PHENYLEPHRINE 40 MCG/ML (10ML) SYRINGE FOR IV PUSH (FOR BLOOD PRESSURE SUPPORT)
PREFILLED_SYRINGE | INTRAVENOUS | Status: AC
  Filled 2020-08-19: qty 10

## 2020-08-19 MED ORDER — ACETAMINOPHEN 325 MG RE SUPP: 650.0000 mg | RECTAL | Status: DC | PRN

## 2020-08-19 MED ORDER — ONDANSETRON HCL 4 MG/2ML IJ SOLN
INTRAMUSCULAR | Status: AC
  Filled 2020-08-19: qty 2

## 2020-08-19 MED ORDER — ACETAMINOPHEN 500 MG PO TABS
1000.0000 mg | ORAL_TABLET | Freq: Once | ORAL | Status: AC
  Administered 2020-08-19: 1000 mg via ORAL

## 2020-08-19 SURGICAL SUPPLY — 25 items
BAG DRAIN URO-CYSTO SKYTR STRL (DRAIN) ×2 IMPLANT
BAG DRN UROCATH (DRAIN) ×1
BASKET STONE 1.7 NGAGE (UROLOGICAL SUPPLIES) ×2 IMPLANT
BASKET ZERO TIP NITINOL 2.4FR (BASKET) IMPLANT
BSKT STON RTRVL ZERO TP 2.4FR (BASKET)
CATH URET 5FR 28IN CONE TIP (BALLOONS)
CATH URET 5FR 28IN OPEN ENDED (CATHETERS) ×2 IMPLANT
CATH URET 5FR 70CM CONE TIP (BALLOONS) IMPLANT
CLOTH BEACON ORANGE TIMEOUT ST (SAFETY) ×2 IMPLANT
ELECT REM PT RETURN 9FT ADLT (ELECTROSURGICAL)
ELECTRODE REM PT RTRN 9FT ADLT (ELECTROSURGICAL) IMPLANT
FIBER LASER FLEXIVA 365 (UROLOGICAL SUPPLIES) IMPLANT
FIBER LASER TRAC TIP (UROLOGICAL SUPPLIES) ×2 IMPLANT
GLOVE SURG SS PI 8.0 STRL IVOR (GLOVE) ×2 IMPLANT
GOWN STRL REUS W/TWL XL LVL3 (GOWN DISPOSABLE) ×2 IMPLANT
GUIDEWIRE ANG ZIPWIRE 038X150 (WIRE) IMPLANT
GUIDEWIRE STR DUAL SENSOR (WIRE) ×2 IMPLANT
IV NS IRRIG 3000ML ARTHROMATIC (IV SOLUTION) ×2 IMPLANT
KIT TURNOVER CYSTO (KITS) ×2 IMPLANT
MANIFOLD NEPTUNE II (INSTRUMENTS) ×2 IMPLANT
NS IRRIG 500ML POUR BTL (IV SOLUTION) ×2 IMPLANT
PACK CYSTO (CUSTOM PROCEDURE TRAY) ×2 IMPLANT
STENT URET 6FRX24 CONTOUR (STENTS) ×2 IMPLANT
TUBE CONNECTING 12X1/4 (SUCTIONS) ×2 IMPLANT
TUBING UROLOGY SET (TUBING) ×2 IMPLANT

## 2020-08-19 NOTE — Discharge Instructions (Addendum)
Ureteral Stent Implantation, Care After This sheet gives you information about how to care for yourself after your procedure. Your health care provider may also give you more specific instructions. If you have problems or questions, contact your health care provider. What can I expect after the procedure? After the procedure, it is common to have:  Nausea.  Mild pain when you urinate. You may feel this pain in your lower back or lower abdomen. The pain should stop within a few minutes after you urinate. This may last for up to 1 week.  A small amount of blood in your urine for several days. Follow these instructions at home: Medicines  Take over-the-counter and prescription medicines only as told by your health care provider.  If you were prescribed an antibiotic medicine, take it as told by your health care provider. Do not stop taking the antibiotic even if you start to feel better.  Do not drive for 24 hours if you were given a sedative during your procedure.  Ask your health care provider if the medicine prescribed to you requires you to avoid driving or using heavy machinery. Activity  Rest as told by your health care provider.  Avoid sitting for a long time without moving. Get up to take short walks every 1-2 hours. This is important to improve blood flow and breathing. Ask for help if you feel weak or unsteady.  Return to your normal activities as told by your health care provider. Ask your health care provider what activities are safe for you. General instructions   Watch for any blood in your urine. Call your health care provider if the amount of blood in your urine increases.  If you have a catheter: ? Follow instructions from your health care provider about taking care of your catheter and collection bag. ? Do not take baths, swim, or use a hot tub until your health care provider approves. Ask your health care provider if you may take showers. You may only be allowed to  take sponge baths.  Drink enough fluid to keep your urine pale yellow.  Do not use any products that contain nicotine or tobacco, such as cigarettes, e-cigarettes, and chewing tobacco. These can delay healing after surgery. If you need help quitting, ask your health care provider.  Keep all follow-up visits as told by your health care provider. This is important. Contact a health care provider if:  You have pain that gets worse or does not get better with medicine, especially pain when you urinate.  You have difficulty urinating.  You feel nauseous or you vomit repeatedly during a period of more than 2 days after the procedure. Get help right away if:  Your urine is dark red or has blood clots in it.  You are leaking urine (have incontinence).  The end of the stent comes out of your urethra.  You cannot urinate.  You have sudden, sharp, or severe pain in your abdomen or lower back.  You have a fever.  You have swelling or pain in your legs.  You have difficulty breathing. Summary  After the procedure, it is common to have mild pain when you urinate that goes away within a few minutes after you urinate. This may last for up to 1 week.  Watch for any blood in your urine. Call your health care provider if the amount of blood in your urine increases.  Take over-the-counter and prescription medicines only as told by your health care provider.  Drink   enough fluid to keep your urine pale yellow.  You may remove the stent by pulling the attached string which is tuck vaginally on Monday morning.  If you don't feel you can do that, please contact the office on Tuesday to come have it removed.    I have also sent a prescription for Nystatin powder that you can use 2-3 x daily on the rash in the groin area.  This information is not intended to replace advice given to you by your health care provider. Make sure you discuss any questions you have with your health care  provider. Document Revised: 09/10/2018 Document Reviewed: 09/11/2018 Elsevier Patient Education  [place] Instructions  Activity: Get plenty of rest for the remainder of the day. A responsible individual must stay with you for 24 hours following the procedure.  For the next 24 hours, DO NOT: -Drive a car -Paediatric nurse -Drink alcoholic beverages -Take any medication unless instructed by your physician -Make any legal decisions or sign important papers.  Meals: Start with liquid foods such as gelatin or soup. Progress to regular foods as tolerated. Avoid greasy, spicy, heavy foods. If nausea and/or vomiting occur, drink only clear liquids until the nausea and/or vomiting subsides. Call your physician if vomiting continues.  Special Instructions/Symptoms: Your throat may feel dry or sore from the anesthesia or the breathing tube placed in your throat during surgery. If this causes discomfort, gargle with warm salt water. The discomfort should disappear within 24 hours.  If you had a scopolamine patch placed behind your ear for the management of post- operative nausea and/or vomiting:  1. The medication in the patch is effective for 72 hours, after which it should be removed.  Wrap patch in a tissue and discard in the trash. Wash hands thoroughly with soap and water. 2. You may remove the patch earlier than 72 hours if you experience unpleasant side effects which may include dry mouth, dizziness or visual disturbances. 3. Avoid touching the patch. Wash your hands with soap and water after contact with the patch.

## 2020-08-19 NOTE — Interval H&P Note (Signed)
History and Physical Interval Note:  Her final CT report demonstrated a 38mm left distal stone and a 71mm right mid ureteral stone without obstruction.   I will do bilateral RTG's and possible ureteroscopy.   08/19/2020 7:52 AM  [name] [name]  has presented today for surgery, with the diagnosis of BILATERAL URETEROVESSICAL JUNCTION STONE.  The various methods of treatment have been discussed with the patient and family. After consideration of risks, benefits and other options for treatment, the patient has consented to  Procedure(s): CYSTOSCOPY BILATERAL  RETROGRADE  URETEROSCOPY/HOLMIUM LASER/STENT PLACEMENT (Bilateral) as a surgical intervention.  [name] history has been reviewed, patient examined, no change in status, stable for surgery.  I have reviewed the patient's chart and labs.  Questions were answered to the patient's satisfaction.     Irine Seal

## 2020-08-19 NOTE — Anesthesia Preprocedure Evaluation (Addendum)
Anesthesia Evaluation  Patient identified by MRN, date of birth, ID band Patient awake    Reviewed: Allergy & Precautions, NPO status , Patient's Chart, lab work & pertinent test results, reviewed documented beta blocker date and time   Airway Mallampati: II  TM Distance: >3 FB Neck ROM: Full    Dental  (+) Missing, Dental Advisory Given,    Pulmonary neg pulmonary ROS,    Pulmonary exam normal breath sounds clear to auscultation       Cardiovascular hypertension, Pt. on home beta blockers and Pt. on medications Normal cardiovascular exam+ dysrhythmias Atrial Fibrillation + Valvular Problems/Murmurs (moderate AS) AS  Rhythm:Regular Rate:Normal  TTE 05/2020 1. Left ventricular ejection fraction, by estimation, is 55 to 60%. The left ventricle has normal function. The left ventricle has no regional wall motion abnormalities. There is mild left ventricular hypertrophy. Left ventricular diastolic parameters are indeterminate.  2. Right ventricular systolic function is mildly reduced. The right ventricular size is normal. There is normal pulmonary artery systolic pressure. The estimated right ventricular systolic pressure is 35.0 mmHg.  3. Left atrial size was mildly dilated.  4. The mitral valve is abnormal. Trivial mitral valve regurgitation. No evidence of mitral stenosis.  5. The aortic valve is abnormal. Aortic valve regurgitation is trivial. Moderate aortic valve stenosis. Aortic valve mean gradient measures 20.0 mmHg.  6. The inferior vena cava is normal in size with greater than 50% respiratory variability, suggesting right atrial pressure of 3 mmHg  LHC 05/2020 The left ventricular systolic function is normal. LV end diastolic pressure is normal. The left ventricular ejection fraction is 50-55% by visual estimate. There is no aortic valve stenosis. Minimal, nobstructive CAD. Tortuous cororany arteries     Neuro/Psych negative neurological ROS  negative psych ROS   GI/Hepatic Neg liver ROS, GERD  ,  Endo/Other  diabetesHypothyroidism Obese BMI 39  Renal/GU negative Renal ROS  negative genitourinary   Musculoskeletal  (+) Arthritis ,   Abdominal   Peds  Hematology  (+) Blood dyscrasia (on eliquis), ,   Anesthesia Other Findings   Reproductive/Obstetrics                           Anesthesia Physical Anesthesia Plan  ASA: III  Anesthesia Plan: General   Post-op Pain Management:    Induction: Intravenous  PONV Risk Score and Plan: 3 and Ondansetron, Dexamethasone and Treatment may vary due to age or medical condition  Airway Management Planned: LMA  Additional Equipment:   Intra-op Plan:   Post-operative Plan: Extubation in OR  Informed Consent: I have reviewed the patients History and Physical, chart, labs and discussed the procedure including the risks, benefits and alternatives for the proposed anesthesia with the patient or authorized representative who has indicated his/her understanding and acceptance.     Dental advisory given  Plan Discussed with: CRNA  Anesthesia Plan Comments:         Anesthesia Quick Evaluation

## 2020-08-19 NOTE — Anesthesia Procedure Notes (Signed)
Procedure Name: LMA Insertion Date/Time: 08/19/2020 8:09 AM Performed by: Genelle Bal, CRNA Pre-anesthesia Checklist: Patient identified, Emergency Drugs available, Suction available and Patient being monitored Patient Re-evaluated:Patient Re-evaluated prior to induction Oxygen Delivery Method: Circle system utilized Preoxygenation: Pre-oxygenation with 100% oxygen Induction Type: IV induction Ventilation: Mask ventilation without difficulty LMA: LMA inserted LMA Size: 4.0 Number of attempts: 1 Airway Equipment and Method: Bite block Placement Confirmation: positive ETCO2 Tube secured with: Tape Dental Injury: Teeth and Oropharynx as per pre-operative assessment

## 2020-08-19 NOTE — Anesthesia Postprocedure Evaluation (Signed)
Anesthesia Post Note  Patient: [name] [name]  Procedure(s) Performed: CYSTOSCOPY BILATERAL  RETROGRADE  URETEROSCOPY/HOLMIUM LASER/STENT PLACEMENT (Bilateral Bladder)     Patient location during evaluation: PACU Anesthesia Type: General Level of consciousness: awake and alert Pain management: pain level controlled Vital Signs Assessment: post-procedure vital signs reviewed and stable Respiratory status: spontaneous breathing, nonlabored ventilation, respiratory function stable and patient connected to nasal cannula oxygen Cardiovascular status: blood pressure returned to baseline and stable Postop Assessment: no apparent nausea or vomiting Anesthetic complications: no   No complications documented.  Last Vitals:  Vitals:   08/19/20 0915 08/19/20 0930  BP: 128/76 128/75  Pulse: 96 99  Resp: 18 17  Temp:    SpO2: 96% 97%    Last Pain:  Vitals:   08/19/20 0930  TempSrc:   PainSc: 0-No pain                 Zaynab Chipman L Amrita Radu

## 2020-08-19 NOTE — Op Note (Signed)
Procedure: 1.  Cystoscopy with bilateral retrograde pyelograms and interpretation. 2.  Left diagnostic ureteroscopy.  3 right ureteroscopic stone extraction with holmium laser application and insertion of right double-J stent. 4.  Application of fluoroscopy.  Preop diagnosis: Bilateral ureteral stones with hematuria.  Postop diagnosis: Interval passage of left ureteral stone and right mid ureteral stone with hematuria.  Surgeon: Dr. Irine Seal.  Anesthesia: General.  Specimens: Stone fragments.  Drain: 6 [place] by 24 cm right double-J stent with tether.  EBL: 3 mL.  Complications: None.  Indications: The patient is a 79 year old female with gross hematuria who was found on CT scanning to have a 3 mm nonobstructing left distal ureteral stone and a 3 mm nonobstructing right mid ureteral stone she was also noted to have approximately 2.5 cm solid left renal mass that will need attention in the future and very small bilateral lower pole stones.  She is to undergo cystoscopy with bilateral retrograde pyelography and ureteroscopy as indicated.  Procedure: She was given 2 g of Ancef.  A general anesthetic was induced.  She was placed in lithotomy position and fitted with PAS hose.  Prior to prepping she was noted to have fairly significant cutaneous candidiasis of the groins.  She was then prepped with Betadine solution and draped in usual sterile fashion.  Cystoscopy was performed using a 23 [place] scope and 30 degree lens.  Examination revealed a normal urethra.  On entry into the bladder she was noted to have very bloody urine with some fresh clots the base the bladder.  The bladder was evacuated clear and inspection then revealed a smooth bladder wall without mucosal lesions.  The left ureteral orifice was in the normal anatomic position effluxing clear urine but there was mild erythema of the orifice suggestive of recent stone passage.  The right ureteral orifice was in the normal anatomic  position and was effluxing grossly bloody urine.  Bilateral retrograde pyelography was performed with Omnipaque and a 5 [place] open-ended catheter.  The right  retrograde pyelogram demonstrated a normal caliber distal ureter with a filling defect in the lower mid ureter consistent with a stone possibly with a small amount of clot above it.  The more proximal ureter was without filling defects or dilation and intrarenal collecting system was unremarkable.  The left retrograde pyelogram revealed a normal, delicate ureter without filling defects.  The internal collecting system was delicate without filling defects.  The single-lumen semirigid ureteroscope was then passed up the right ureteral orifice and the stone was identified in the lower mid ureter.  The stone was initially grasped with an engage basket but was too large to remove intact due to some mild stricturing of the ureter distal to it.  A 200 m tract tip laser was then passed and the stone was broken into manageable fragments with initial laser settings of 0.3 J and 20 Hz with a subsequent increase in the power to 0.5 J.  The fragments were then removed with the engage basket.  The ureteroscope was then advanced more proximally no additional stones were noted and no active bleeding was noted above the level of the stone.  There was some mucosal irritation in the ureter.  The stone which did have some minor active bleeding.  Ureteroscope was then gently passed up the left ureteral orifice and no stones were identified confirming the impression from the retrograde pyelogram.  The cystoscope was then reinserted and a sensor wire was advanced to the right kidney without difficulty  under fluoroscopic guidance.  A 6 [place] by 24 cm contour double-J stent was passed over the wire to the kidney and the wire was removed leaving a good coil in the kidney and a good coil in the bladder.  The cystoscope was then removed after the bladder was drained and  the stent string was left exiting the urethra.  The string was tied close to the meatus, trimmed to an appropriate length and tucked vaginally.  Final fluoroscopy revealed good position of the stent.  She was taken down from lithotomy position, her anesthetic was reversed and she was moved to recovery room in stable condition.  She will be given her stone fragments to bring to the office for analysis.

## 2020-08-19 NOTE — Transfer of Care (Signed)
Immediate Anesthesia Transfer of Care Note  Patient: [name] [name]  Procedure(s) Performed: CYSTOSCOPY BILATERAL  RETROGRADE  URETEROSCOPY/HOLMIUM LASER/STENT PLACEMENT (Bilateral Bladder)  Patient Location: PACU  Anesthesia Type:General  Level of Consciousness: drowsy and patient cooperative  Airway & Oxygen Therapy: Patient Spontanous Breathing and Patient connected to face mask oxygen  Post-op Assessment: Report given to RN and Post -op Vital signs reviewed and stable  Post vital signs: Reviewed and stable  Last Vitals:  Vitals Value Taken Time  BP 118/81 08/19/20 0851  Temp    Pulse 98 08/19/20 0852  Resp 16 08/19/20 0852  SpO2 96 % 08/19/20 0852  Vitals shown include unvalidated device data.  Last Pain:  Vitals:   08/19/20 3559  TempSrc: Oral  PainSc: 0-No pain      Patients Stated Pain Goal: 5 (74/16/38 [id number])  Complications: No complications documented.

## 2020-08-20 ENCOUNTER — Encounter (HOSPITAL_BASED_OUTPATIENT_CLINIC_OR_DEPARTMENT_OTHER): Payer: Self-pay | Admitting: Urology

## 2020-09-10 ENCOUNTER — Other Ambulatory Visit: Payer: Self-pay | Admitting: Family Medicine

## 2020-09-10 DIAGNOSIS — M8580 Other specified disorders of bone density and structure, unspecified site: Secondary | ICD-10-CM

## 2020-09-14 ENCOUNTER — Ambulatory Visit (HOSPITAL_COMMUNITY)
Admission: RE | Admit: 2020-09-14 | Discharge: 2020-09-14 | Disposition: A | Discharge status: Home / Self Care | Payer: Medicare Other | Source: Ambulatory Visit | Attending: Nurse Practitioner | Admitting: Nurse Practitioner

## 2020-09-14 ENCOUNTER — Encounter (HOSPITAL_COMMUNITY): Payer: Self-pay | Admitting: Nurse Practitioner

## 2020-09-14 ENCOUNTER — Other Ambulatory Visit: Payer: Self-pay

## 2020-09-14 VITALS — BP 128/82 | HR 86 | Ht 66.0 in | Wt 241.2 lb

## 2020-09-14 DIAGNOSIS — Z862 Personal history of diseases of the blood and blood-forming organs and certain disorders involving the immune mechanism: Secondary | ICD-10-CM | POA: Insufficient documentation

## 2020-09-14 DIAGNOSIS — Z887 Allergy status to serum and vaccine status: Secondary | ICD-10-CM | POA: Diagnosis not present

## 2020-09-14 DIAGNOSIS — Z20822 Contact with and (suspected) exposure to covid-19: Secondary | ICD-10-CM | POA: Diagnosis not present

## 2020-09-14 DIAGNOSIS — Z79899 Other long term (current) drug therapy: Secondary | ICD-10-CM | POA: Diagnosis not present

## 2020-09-14 DIAGNOSIS — D6869 Other thrombophilia: Secondary | ICD-10-CM | POA: Diagnosis not present

## 2020-09-14 DIAGNOSIS — Z7983 Long term (current) use of bisphosphonates: Secondary | ICD-10-CM | POA: Insufficient documentation

## 2020-09-14 DIAGNOSIS — I10 Essential (primary) hypertension: Secondary | ICD-10-CM | POA: Diagnosis not present

## 2020-09-14 DIAGNOSIS — E119 Type 2 diabetes mellitus without complications: Secondary | ICD-10-CM | POA: Insufficient documentation

## 2020-09-14 DIAGNOSIS — Z791 Long term (current) use of non-steroidal anti-inflammatories (NSAID): Secondary | ICD-10-CM | POA: Insufficient documentation

## 2020-09-14 DIAGNOSIS — I4819 Other persistent atrial fibrillation: Secondary | ICD-10-CM | POA: Insufficient documentation

## 2020-09-14 DIAGNOSIS — K219 Gastro-esophageal reflux disease without esophagitis: Secondary | ICD-10-CM | POA: Insufficient documentation

## 2020-09-14 DIAGNOSIS — Z888 Allergy status to other drugs, medicaments and biological substances status: Secondary | ICD-10-CM | POA: Insufficient documentation

## 2020-09-14 DIAGNOSIS — Z7901 Long term (current) use of anticoagulants: Secondary | ICD-10-CM | POA: Diagnosis not present

## 2020-09-14 DIAGNOSIS — Z8679 Personal history of other diseases of the circulatory system: Secondary | ICD-10-CM | POA: Diagnosis not present

## 2020-09-14 DIAGNOSIS — Z45018 Encounter for adjustment and management of other part of cardiac pacemaker: Secondary | ICD-10-CM | POA: Insufficient documentation

## 2020-09-14 LAB — BASIC METABOLIC PANEL
Anion gap: 9 (ref 5–15)
BUN: 21 mg/dL (ref 8–23)
CO2: 24 mmol/L (ref 22–32)
Calcium: 9.3 mg/dL (ref 8.9–10.3)
Chloride: 109 mmol/L (ref 98–111)
Creatinine, Ser: 0.95 mg/dL (ref 0.44–1.00)
GFR calc Af Amer: >60 mL/min (ref >60)
GFR calc non Af Amer: 58 mL/min — ABNORMAL LOW (ref >60)
Glucose, Bld: 100 mg/dL — ABNORMAL HIGH (ref 70–99)
Potassium: 4.5 mmol/L (ref 3.5–5.1)
Sodium: 142 mmol/L (ref 135–145)

## 2020-09-14 LAB — CBC
HCT: 38.4 % (ref 36.0–46.0)
Hemoglobin: 11.3 g/dL — ABNORMAL LOW (ref 12.0–15.0)
MCH: 26.1 pg (ref 26.0–34.0)
MCHC: 29.4 g/dL — ABNORMAL LOW (ref 30.0–36.0)
MCV: 88.7 fL (ref 80.0–100.0)
Platelets: 275 10*3/uL (ref 150–400)
RBC: 4.33 MIL/uL (ref 3.87–5.11)
RDW: 15.5 % (ref 11.5–15.5)
WBC: 8.7 10*3/uL (ref 4.0–10.5)
nRBC: 0 % (ref 0.0–0.2)

## 2020-09-14 NOTE — H&P (View-Only) (Signed)
Primary Care Physician: Glenis Smoker, MD Referring Physician:Acharya,Gayatri , MD   [name] [name] is a 79 y.o. female with a h/o HTN, DM, that presented tp [facility] ED with chest pain and found to have new onset afib. She was admitted and had an echo that was unremarkable and a Left heart cath that showed mimimal nonobstructive CAD. She was discharfed in afib on diltiazem 120 mg qd and eliquis 5 mg bid for a CHA2DS2VASc score of 5.  In the afib clinic she remains in afib with RVR. Overall she feels ok.No  further chest pain. She does not drink alcohol, drinks several diet Pepsi's a day, no tobacco and denies snoring.  She does have intermittent LLE, Rt > Lt and was d/c on lasix as needed.   F/u in afib clinic 7/7. She is now approaching the full 3 week requirement of DOAC for  cardioversion. She  has chronic lower extremity edema which preceded afib but has required more lasix since in afib. She is rate controlled.   F/u in afib clinic 7/23. Unfortunately, DCCV was cancelled as pt started having hematuria the day before so it was cancelled until it could be determined source of bleeding as her U/A did not show any infection. She took an round of antibiotics and has not seen any more bleeding. DCCV will be rescheduled.She continues in rate controlled afib.   F/u in afib clinic, 09/14/20. It was discovered that hematuria was coming from  a kidney stone that was surgically removed 9/4. The hematuria stopped about a week after that. She is now back in  the afib clinic to have the cardioversion rescheduled. She had a delayed eliquis dose by 8 hours on 9/14 so will be scheduled 3 weeks after that date.   Today, she denies symptoms of palpitations, chest pain, shortness of breath, orthopnea, PND, lower extremity edema, dizziness, presyncope, syncope, or neurologic sequela. The patient is tolerating medications without difficulties and is otherwise without complaint today.   Past Medical History:    Diagnosis Date  . Anemia    hx of  . Arthritis    knee and shoulders  . Diabetes mellitus without complication (HCC)    diet controlledm no meds in 5-6 yrs  . Dyspnea    on exertion   . Dysrhythmia 05/2020   afib  . GERD (gastroesophageal reflux disease)   . Hematuria   . History of hypothyroidism 30 yrs ago  . History of kidney stones   . Hypertension    Past Surgical History:  Procedure Laterality Date  . ABDOMINAL HYSTERECTOMY  1992   complete  . BILATERAL CARPAL TUNNEL RELEASE  yrs ago  . CHOLECYSTECTOMY  1996  . CYSTOSCOPY/URETEROSCOPY/HOLMIUM LASER/STENT PLACEMENT Bilateral 08/19/2020   Procedure: CYSTOSCOPY BILATERAL  RETROGRADE  URETEROSCOPY/HOLMIUM LASER/STENT PLACEMENT;  Surgeon: Irine Seal, MD;  Location: [facility];  Service: Urology;  Laterality: Bilateral;  . HAMMER TOE SURGERY Right yrs ago  . LEFT HEART CATH AND CORONARY ANGIOGRAPHY N/A 06/02/2020   Procedure: LEFT HEART CATH AND CORONARY ANGIOGRAPHY;  Surgeon: Jettie Booze, MD;  Location: [place] [facility];  Service: Cardiovascular;  Laterality: N/A;  . TONSILLECTOMY  age 84    Current Outpatient Medications  Medication Sig Dispense Refill  . acetaminophen (TYLENOL) 500 MG tablet Take 1,000 mg by mouth as needed for moderate pain or headache.     [name] apixaban (ELIQUIS) 5 MG TABS tablet Take 1 tablet (5 mg total) by mouth 2 (two)  times daily. 60 tablet 3  . Calcium Carb-Cholecalciferol (CALCIUM 600+D) 600-800 MG-UNIT TABS Take 1 tablet by mouth daily.    . furosemide (LASIX) 20 MG tablet Take 1 tablet (20 mg total) by mouth daily as needed (edema in the leg with blisters, weight gain of 3lbs in 1 day or 5lbs in 2 days.). 30 tablet 2  . hydrocortisone cream 0.5 % Apply topically 3 (three) times daily. (Patient taking differently: Apply topically as needed. ) 30 g 0  . Liniments (BLUE-EMU SUPER STRENGTH) CREA Apply 1 application topically 2 (two) times daily.    [name] loperamide (IMODIUM A-D) 2  MG tablet Take 2-4 mg by mouth as needed for diarrhea or loose stools.     . metoprolol tartrate (LOPRESSOR) 25 MG tablet Take 1 tablet (25 mg total) by mouth 2 (two) times daily. 60 tablet 3  . Multiple Vitamins-Minerals (PRESERVISION AREDS 2 PO) Take 1 capsule by mouth 2 (two) times daily.     [name] nystatin (MYCOSTATIN/NYSTOP) powder Apply 1 application topically 3 (three) times daily. To rash in groin 30 g 3  . Potassium Chloride ER 20 MEQ TBCR TAKE 20 MEQ BY MOUTH DAILY AS NEEDED (TAKE WHEN YOU TAKE LASIX.). 20 tablet 2  . Multiple Vitamins-Minerals (ONE-A-DAY 50 PLUS PO) Take 1 tablet by mouth daily.  (Patient not taking: Reported on 09/14/2020)     No current facility-administered medications for this encounter.    Allergies  Allergen Reactions  . Tetanus-Diphtheria Toxoids Td Other (See Comments)    Felt very sick  . Cardizem [Diltiazem] Rash    Social History   Socioeconomic History  . Marital status: Married    Spouse name: Not on file  . Number of children: Not on file  . Years of education: Not on file  . Highest education level: Not on file  Occupational History  . Not on file  Tobacco Use  . Smoking status: Never Smoker  . Smokeless tobacco: Never Used  Vaping Use  . Vaping Use: Never used  Substance and Sexual Activity  . Alcohol use: Not Currently  . Drug use: Never  . Sexual activity: Not Currently  Other Topics Concern  . Not on file  Social History Narrative  . Not on file   Social Determinants of Health   Financial Resource Strain:   . Difficulty of Paying Living Expenses: Not on file  Food Insecurity:   . Worried About Charity fundraiser in the Last Year: Not on file  . Ran Out of Food in the Last Year: Not on file  Transportation Needs:   . Lack of Transportation (Medical): Not on file  . Lack of Transportation (Non-Medical): Not on file  Physical Activity:   . Days of Exercise per Week: Not on file  . Minutes of Exercise per Session: Not on file   Stress:   . Feeling of Stress : Not on file  Social Connections:   . Frequency of Communication with Friends and Family: Not on file  . Frequency of Social Gatherings with Friends and Family: Not on file  . Attends Religious Services: Not on file  . Active Member of Clubs or Organizations: Not on file  . Attends Archivist Meetings: Not on file  . Marital Status: Not on file  Intimate Partner Violence:   . Fear of Current or Ex-Partner: Not on file  . Emotionally Abused: Not on file  . Physically Abused: Not on file  . Sexually Abused: Not  on file    Family History  Problem Relation Age of Onset  . Hypertension Mother   . Hypertension Father   . Diabetes Paternal Grandmother     ROS- All systems are reviewed and negative except as per the HPI above  Physical Exam: Vitals:   09/14/20 1433  BP: 128/82  Pulse: 86  Weight: 109.4 kg  Height: 5\' 6"  (1.676 m)   Wt Readings from Last 3 Encounters:  09/14/20 109.4 kg  08/19/20 109 kg  07/09/20 (!) 111.9 kg    Labs: Lab Results  Component Value Date   NA 144 08/19/2020   K 3.6 08/19/2020   CL 109 08/19/2020   CO2 22 07/09/2020   GLUCOSE 109 (H) 08/19/2020   BUN 16 08/19/2020   CREATININE 0.70 08/19/2020   CALCIUM 9.2 07/09/2020   PHOS 2.9 06/02/2020   MG 2.2 06/04/2020   No results found for: INR Lab Results  Component Value Date   CHOL 184 06/01/2020   HDL 51 06/01/2020   LDLCALC 120 (H) 06/01/2020   TRIG 67 06/01/2020     GEN- The patient is well appearing, alert and oriented x 3 today.   Head- normocephalic, atraumatic Eyes-  Sclera clear, conjunctiva pink Ears- hearing intact Oropharynx- clear Neck- supple, no JVP Lymph- no cervical lymphadenopathy Lungs- Clear to ausculation bilaterally, normal work of breathing Heart- irregular rate and rhythm, no murmurs, rubs or gallops, PMI not laterally displaced GI- soft, NT, ND, + BS Extremities- no clubbing, cyanosis, 2+ edema with statis  dermatitis  MS- no significant deformity or atrophy Skin- no rash or lesion Psych- euthymic mood, full affect Neuro- strength and sensation are intact  EKG-afib at 86 mpm,  qrs int 86 ms, qtc 437  bms   LHC- The left ventricular systolic function is normal.  LV end diastolic pressure is normal.  The left ventricular ejection fraction is 50-55% by visual estimate.  There is no aortic valve stenosis.  Minimal, nobstructive CAD. Tortuous cororany arteries.   Echo-1. Left ventricular ejection fraction, by estimation, is 55 to 60%. The  left ventricle has normal function. The left ventricle has no regional  wall motion abnormalities. There is mild left ventricular hypertrophy.  Left ventricular diastolic parameters  are indeterminate.  2. Right ventricular systolic function is mildly reduced. The right  ventricular size is normal. There is normal pulmonary artery systolic  pressure. The estimated right ventricular systolic pressure is 92.4 mmHg.  3. Left atrial size was mildly dilated.  4. The mitral valve is abnormal. Trivial mitral valve regurgitation. No  evidence of mitral stenosis.  5. The aortic valve is abnormal. Aortic valve regurgitation is trivial.  Moderate aortic valve stenosis. Aortic valve mean gradient measures 20.0  mmHg.  6. The inferior vena cava is normal in size with greater than 50%  respiratory variability, suggesting right atrial pressure of 3 mmHg.   Assessment and Plan: 1. New onset persistent afib Rate controlled DCCV cancelled when initially  scheduled for new onset hematuria Stone removed 9/4 Will reschedule cardioversion for 10/7 Continue 120 mg Cardizem daily    2. CHA2DS2VASc score of  at least 5  Continue eliquis 5 mg bid, had a delayed dose by 8 hours on 9/14 so DCCV Scheduled for 3 weeks after that date  on 10/7  Bmet/mag covid test scheduled  Has had both vaccines    Recheck here in 7-10 days after cardioversion   Butch Penny C.  Alayzia Pavlock, [facility] [address] [place], [place] [address] [phone]

## 2020-09-14 NOTE — Progress Notes (Signed)
Primary Care Physician: Glenis Smoker, MD Referring Physician:Acharya,Gayatri , MD   [name] [name] is a 79 y.o. female with a h/o HTN, DM, that presented tp [facility] ED with chest pain and found to have new onset afib. She was admitted and had an echo that was unremarkable and a Left heart cath that showed mimimal nonobstructive CAD. She was discharfed in afib on diltiazem 120 mg qd and eliquis 5 mg bid for a CHA2DS2VASc score of 5.  In the afib clinic she remains in afib with RVR. Overall she feels ok.No  further chest pain. She does not drink alcohol, drinks several diet Pepsi's a day, no tobacco and denies snoring.  She does have intermittent LLE, Rt > Lt and was d/c on lasix as needed.   F/u in afib clinic 7/7. She is now approaching the full 3 week requirement of DOAC for  cardioversion. She  has chronic lower extremity edema which preceded afib but has required more lasix since in afib. She is rate controlled.   F/u in afib clinic 7/23. Unfortunately, DCCV was cancelled as pt started having hematuria the day before so it was cancelled until it could be determined source of bleeding as her U/A did not show any infection. She took an round of antibiotics and has not seen any more bleeding. DCCV will be rescheduled.She continues in rate controlled afib.   F/u in afib clinic, 09/14/20. It was discovered that hematuria was coming from  a kidney stone that was surgically removed 9/4. The hematuria stopped about a week after that. She is now back in  the afib clinic to have the cardioversion rescheduled. She had a delayed eliquis dose by 8 hours on 9/14 so will be scheduled 3 weeks after that date.   Today, she denies symptoms of palpitations, chest pain, shortness of breath, orthopnea, PND, lower extremity edema, dizziness, presyncope, syncope, or neurologic sequela. The patient is tolerating medications without difficulties and is otherwise without complaint today.   Past Medical History:   Diagnosis Date  . Anemia    hx of  . Arthritis    knee and shoulders  . Diabetes mellitus without complication (HCC)    diet controlledm no meds in 5-6 yrs  . Dyspnea    on exertion   . Dysrhythmia 05/2020   afib  . GERD (gastroesophageal reflux disease)   . Hematuria   . History of hypothyroidism 30 yrs ago  . History of kidney stones   . Hypertension    Past Surgical History:  Procedure Laterality Date  . ABDOMINAL HYSTERECTOMY  1992   complete  . BILATERAL CARPAL TUNNEL RELEASE  yrs ago  . CHOLECYSTECTOMY  1996  . CYSTOSCOPY/URETEROSCOPY/HOLMIUM LASER/STENT PLACEMENT Bilateral 08/19/2020   Procedure: CYSTOSCOPY BILATERAL  RETROGRADE  URETEROSCOPY/HOLMIUM LASER/STENT PLACEMENT;  Surgeon: Irine Seal, MD;  Location: [facility];  Service: Urology;  Laterality: Bilateral;  . HAMMER TOE SURGERY Right yrs ago  . LEFT HEART CATH AND CORONARY ANGIOGRAPHY N/A 06/02/2020   Procedure: LEFT HEART CATH AND CORONARY ANGIOGRAPHY;  Surgeon: Jettie Booze, MD;  Location: [place] [facility];  Service: Cardiovascular;  Laterality: N/A;  . TONSILLECTOMY  age 57    Current Outpatient Medications  Medication Sig Dispense Refill  . acetaminophen (TYLENOL) 500 MG tablet Take 1,000 mg by mouth as needed for moderate pain or headache.     [name] apixaban (ELIQUIS) 5 MG TABS tablet Take 1 tablet (5 mg total) by mouth 2 (two) times  daily. 60 tablet 3  . Calcium Carb-Cholecalciferol (CALCIUM 600+D) 600-800 MG-UNIT TABS Take 1 tablet by mouth daily.    . furosemide (LASIX) 20 MG tablet Take 1 tablet (20 mg total) by mouth daily as needed (edema in the leg with blisters, weight gain of 3lbs in 1 day or 5lbs in 2 days.). 30 tablet 2  . hydrocortisone cream 0.5 % Apply topically 3 (three) times daily. (Patient taking differently: Apply topically as needed. ) 30 g 0  . Liniments (BLUE-EMU SUPER STRENGTH) CREA Apply 1 application topically 2 (two) times daily.    [name] loperamide (IMODIUM A-D) 2  MG tablet Take 2-4 mg by mouth as needed for diarrhea or loose stools.     . metoprolol tartrate (LOPRESSOR) 25 MG tablet Take 1 tablet (25 mg total) by mouth 2 (two) times daily. 60 tablet 3  . Multiple Vitamins-Minerals (PRESERVISION AREDS 2 PO) Take 1 capsule by mouth 2 (two) times daily.     [name] nystatin (MYCOSTATIN/NYSTOP) powder Apply 1 application topically 3 (three) times daily. To rash in groin 30 g 3  . Potassium Chloride ER 20 MEQ TBCR TAKE 20 MEQ BY MOUTH DAILY AS NEEDED (TAKE WHEN YOU TAKE LASIX.). 20 tablet 2  . Multiple Vitamins-Minerals (ONE-A-DAY 50 PLUS PO) Take 1 tablet by mouth daily.  (Patient not taking: Reported on 09/14/2020)     No current facility-administered medications for this encounter.    Allergies  Allergen Reactions  . Tetanus-Diphtheria Toxoids Td Other (See Comments)    Felt very sick  . Cardizem [Diltiazem] Rash    Social History   Socioeconomic History  . Marital status: Married    Spouse name: Not on file  . Number of children: Not on file  . Years of education: Not on file  . Highest education level: Not on file  Occupational History  . Not on file  Tobacco Use  . Smoking status: Never Smoker  . Smokeless tobacco: Never Used  Vaping Use  . Vaping Use: Never used  Substance and Sexual Activity  . Alcohol use: Not Currently  . Drug use: Never  . Sexual activity: Not Currently  Other Topics Concern  . Not on file  Social History Narrative  . Not on file   Social Determinants of Health   Financial Resource Strain:   . Difficulty of Paying Living Expenses: Not on file  Food Insecurity:   . Worried About Charity fundraiser in the Last Year: Not on file  . Ran Out of Food in the Last Year: Not on file  Transportation Needs:   . Lack of Transportation (Medical): Not on file  . Lack of Transportation (Non-Medical): Not on file  Physical Activity:   . Days of Exercise per Week: Not on file  . Minutes of Exercise per Session: Not on file   Stress:   . Feeling of Stress : Not on file  Social Connections:   . Frequency of Communication with Friends and Family: Not on file  . Frequency of Social Gatherings with Friends and Family: Not on file  . Attends Religious Services: Not on file  . Active Member of Clubs or Organizations: Not on file  . Attends Archivist Meetings: Not on file  . Marital Status: Not on file  Intimate Partner Violence:   . Fear of Current or Ex-Partner: Not on file  . Emotionally Abused: Not on file  . Physically Abused: Not on file  . Sexually Abused: Not on  file    Family History  Problem Relation Age of Onset  . Hypertension Mother   . Hypertension Father   . Diabetes Paternal Grandmother     ROS- All systems are reviewed and negative except as per the HPI above  Physical Exam: Vitals:   09/14/20 1433  BP: 128/82  Pulse: 86  Weight: 109.4 kg  Height: 5\' 6"  (1.676 m)   Wt Readings from Last 3 Encounters:  09/14/20 109.4 kg  08/19/20 109 kg  07/09/20 (!) 111.9 kg    Labs: Lab Results  Component Value Date   NA 144 08/19/2020   K 3.6 08/19/2020   CL 109 08/19/2020   CO2 22 07/09/2020   GLUCOSE 109 (H) 08/19/2020   BUN 16 08/19/2020   CREATININE 0.70 08/19/2020   CALCIUM 9.2 07/09/2020   PHOS 2.9 06/02/2020   MG 2.2 06/04/2020   No results found for: INR Lab Results  Component Value Date   CHOL 184 06/01/2020   HDL 51 06/01/2020   LDLCALC 120 (H) 06/01/2020   TRIG 67 06/01/2020     GEN- The patient is well appearing, alert and oriented x 3 today.   Head- normocephalic, atraumatic Eyes-  Sclera clear, conjunctiva pink Ears- hearing intact Oropharynx- clear Neck- supple, no JVP Lymph- no cervical lymphadenopathy Lungs- Clear to ausculation bilaterally, normal work of breathing Heart- irregular rate and rhythm, no murmurs, rubs or gallops, PMI not laterally displaced GI- soft, NT, ND, + BS Extremities- no clubbing, cyanosis, 2+ edema with statis  dermatitis  MS- no significant deformity or atrophy Skin- no rash or lesion Psych- euthymic mood, full affect Neuro- strength and sensation are intact  EKG-afib at 86 mpm,  qrs int 86 ms, qtc 437  bms   LHC- The left ventricular systolic function is normal.  LV end diastolic pressure is normal.  The left ventricular ejection fraction is 50-55% by visual estimate.  There is no aortic valve stenosis.  Minimal, nobstructive CAD. Tortuous cororany arteries.   Echo-1. Left ventricular ejection fraction, by estimation, is 55 to 60%. The  left ventricle has normal function. The left ventricle has no regional  wall motion abnormalities. There is mild left ventricular hypertrophy.  Left ventricular diastolic parameters  are indeterminate.  2. Right ventricular systolic function is mildly reduced. The right  ventricular size is normal. There is normal pulmonary artery systolic  pressure. The estimated right ventricular systolic pressure is 63.8 mmHg.  3. Left atrial size was mildly dilated.  4. The mitral valve is abnormal. Trivial mitral valve regurgitation. No  evidence of mitral stenosis.  5. The aortic valve is abnormal. Aortic valve regurgitation is trivial.  Moderate aortic valve stenosis. Aortic valve mean gradient measures 20.0  mmHg.  6. The inferior vena cava is normal in size with greater than 50%  respiratory variability, suggesting right atrial pressure of 3 mmHg.   Assessment and Plan: 1. New onset persistent afib Rate controlled DCCV cancelled when initially  scheduled for new onset hematuria Stone removed 9/4 Will reschedule cardioversion for 10/7 Continue 120 mg Cardizem daily    2. CHA2DS2VASc score of  at least 5  Continue eliquis 5 mg bid, had a delayed dose by 8 hours on 9/14 so DCCV Scheduled for 3 weeks after that date  on 10/7  Bmet/mag covid test scheduled  Has had both vaccines    Recheck here in 7-10 days after cardioversion   Butch Penny C.  Sidharth Leverette, [facility] [address]  [address] [place], [place] [address] [phone]

## 2020-09-14 NOTE — Patient Instructions (Signed)
Cardioversion scheduled for Thursday, October 7th  - Arrive at the [facility] and go to admitting at [facility] not eat or drink anything after midnight the night prior to your procedure.  - Take all your morning medication (except diabetic medications) with a sip of water prior to arrival.  - You will not be able to drive home after your procedure.  - Do NOT miss any doses of your blood thinner - if you should miss a dose please notify our office immediately.  - If you feel as if you go back into normal rhythm prior to scheduled cardioversion, please notify our office immediately. If your procedure is canceled in the cardioversion suite you will be charged a cancellation fee.

## 2020-09-20 ENCOUNTER — Other Ambulatory Visit (HOSPITAL_COMMUNITY)
Admission: RE | Admit: 2020-09-20 | Discharge: 2020-09-20 | Disposition: A | Discharge status: Home / Self Care | Payer: Medicare Other | Source: Ambulatory Visit | Attending: Cardiology | Admitting: Cardiology

## 2020-09-20 DIAGNOSIS — Z20822 Contact with and (suspected) exposure to covid-19: Secondary | ICD-10-CM | POA: Diagnosis not present

## 2020-09-20 DIAGNOSIS — Z01812 Encounter for preprocedural laboratory examination: Secondary | ICD-10-CM | POA: Diagnosis present

## 2020-09-20 LAB — SARS CORONAVIRUS 2 (TAT 6-24 HRS): SARS Coronavirus 2: NEGATIVE

## 2020-09-21 ENCOUNTER — Other Ambulatory Visit (HOSPITAL_COMMUNITY): Payer: Self-pay | Admitting: Nurse Practitioner

## 2020-09-23 ENCOUNTER — Other Ambulatory Visit: Payer: Self-pay

## 2020-09-23 ENCOUNTER — Encounter (HOSPITAL_COMMUNITY)
Admission: RE | Disposition: A | Discharge status: Home / Self Care | Payer: Self-pay | Source: Home / Self Care | Attending: Cardiology

## 2020-09-23 ENCOUNTER — Ambulatory Visit (HOSPITAL_COMMUNITY): Payer: Medicare Other | Admitting: Anesthesiology

## 2020-09-23 ENCOUNTER — Encounter (HOSPITAL_COMMUNITY): Payer: Self-pay | Admitting: Cardiology

## 2020-09-23 ENCOUNTER — Ambulatory Visit (HOSPITAL_COMMUNITY)
Admission: RE | Admit: 2020-09-23 | Discharge: 2020-09-23 | Disposition: A | Discharge status: Home / Self Care | Payer: Medicare Other | Attending: Cardiology | Admitting: Cardiology

## 2020-09-23 DIAGNOSIS — Z7901 Long term (current) use of anticoagulants: Secondary | ICD-10-CM | POA: Diagnosis not present

## 2020-09-23 DIAGNOSIS — Z833 Family history of diabetes mellitus: Secondary | ICD-10-CM | POA: Diagnosis not present

## 2020-09-23 DIAGNOSIS — Z888 Allergy status to other drugs, medicaments and biological substances status: Secondary | ICD-10-CM | POA: Diagnosis not present

## 2020-09-23 DIAGNOSIS — I10 Essential (primary) hypertension: Secondary | ICD-10-CM | POA: Diagnosis not present

## 2020-09-23 DIAGNOSIS — Z8249 Family history of ischemic heart disease and other diseases of the circulatory system: Secondary | ICD-10-CM | POA: Insufficient documentation

## 2020-09-23 DIAGNOSIS — Z90710 Acquired absence of both cervix and uterus: Secondary | ICD-10-CM | POA: Insufficient documentation

## 2020-09-23 DIAGNOSIS — I4892 Unspecified atrial flutter: Secondary | ICD-10-CM | POA: Insufficient documentation

## 2020-09-23 DIAGNOSIS — Z9049 Acquired absence of other specified parts of digestive tract: Secondary | ICD-10-CM | POA: Diagnosis not present

## 2020-09-23 DIAGNOSIS — I2510 Atherosclerotic heart disease of native coronary artery without angina pectoris: Secondary | ICD-10-CM | POA: Insufficient documentation

## 2020-09-23 DIAGNOSIS — Z79899 Other long term (current) drug therapy: Secondary | ICD-10-CM | POA: Diagnosis not present

## 2020-09-23 DIAGNOSIS — K219 Gastro-esophageal reflux disease without esophagitis: Secondary | ICD-10-CM | POA: Insufficient documentation

## 2020-09-23 DIAGNOSIS — E119 Type 2 diabetes mellitus without complications: Secondary | ICD-10-CM | POA: Insufficient documentation

## 2020-09-23 DIAGNOSIS — I4819 Other persistent atrial fibrillation: Secondary | ICD-10-CM | POA: Insufficient documentation

## 2020-09-23 DIAGNOSIS — Z887 Allergy status to serum and vaccine status: Secondary | ICD-10-CM | POA: Diagnosis not present

## 2020-09-23 DIAGNOSIS — M1990 Unspecified osteoarthritis, unspecified site: Secondary | ICD-10-CM | POA: Insufficient documentation

## 2020-09-23 HISTORY — PX: CARDIOVERSION: SHX1299

## 2020-09-23 SURGERY — CARDIOVERSION
Anesthesia: General

## 2020-09-23 MED ORDER — LIDOCAINE 2% (20 MG/ML) 5 ML SYRINGE
INTRAMUSCULAR | Status: DC | PRN
  Administered 2020-09-23: 100 mg via INTRAVENOUS

## 2020-09-23 MED ORDER — PROPOFOL 10 MG/ML IV BOLUS
INTRAVENOUS | Status: DC | PRN
  Administered 2020-09-23: 70 mg via INTRAVENOUS

## 2020-09-23 MED ORDER — AMIODARONE HCL 200 MG PO TABS: 200.0000 mg | ORAL_TABLET | Freq: Two times a day (BID) | ORAL | 0 refills | Status: DC

## 2020-09-23 MED ORDER — PHENYLEPHRINE 40 MCG/ML (10ML) SYRINGE FOR IV PUSH (FOR BLOOD PRESSURE SUPPORT)
PREFILLED_SYRINGE | INTRAVENOUS | Status: DC | PRN
  Administered 2020-09-23: 80 ug via INTRAVENOUS

## 2020-09-23 NOTE — Anesthesia Postprocedure Evaluation (Signed)
Anesthesia Post Note  Patient: [name] [name]  Procedure(s) Performed: CARDIOVERSION (N/A )     Patient location during evaluation: PACU Anesthesia Type: General Level of consciousness: awake and alert Pain management: pain level controlled Vital Signs Assessment: post-procedure vital signs reviewed and stable Respiratory status: spontaneous breathing, nonlabored ventilation, respiratory function stable and patient connected to nasal cannula oxygen Cardiovascular status: blood pressure returned to baseline and stable Postop Assessment: no apparent nausea or vomiting Anesthetic complications: no   No complications documented.  Last Vitals:  Vitals:   09/23/20 1105 09/23/20 1115  BP: (!) 106/56 (!) 113/55  Pulse: 86 80  Resp: 13 18  Temp:    SpO2: 97% 96%    Last Pain:  Vitals:   09/23/20 1115  TempSrc:   PainSc: 0-No pain                 Griff Badley

## 2020-09-23 NOTE — Interval H&P Note (Signed)
History and Physical Interval Note:  09/23/2020 10:21 AM  [name] [name]  has presented today for surgery, with the diagnosis of AFIB.  The various methods of treatment have been discussed with the patient and family. After consideration of risks, benefits and other options for treatment, the patient has consented to  Procedure(s): CARDIOVERSION (N/A) as a surgical intervention.  [name] history has been reviewed, patient examined, no change in status, stable for surgery.  I have reviewed the patient's chart and labs.  Questions were answered to the patient's satisfaction.     Freada Bergeron

## 2020-09-23 NOTE — Anesthesia Preprocedure Evaluation (Signed)
Anesthesia Evaluation  Patient identified by MRN, date of birth, ID band Patient awake    Reviewed: Allergy & Precautions, NPO status , Patient's Chart, lab work & pertinent test results, reviewed documented beta blocker date and time   Airway Mallampati: II  TM Distance: >3 FB Neck ROM: Full    Dental  (+) Missing, Dental Advisory Given,    Pulmonary neg pulmonary ROS,    Pulmonary exam normal breath sounds clear to auscultation       Cardiovascular hypertension, Pt. on home beta blockers and Pt. on medications Normal cardiovascular exam+ dysrhythmias Atrial Fibrillation + Valvular Problems/Murmurs (moderate AS) AS  Rhythm:Regular Rate:Normal  TTE 05/2020 1. Left ventricular ejection fraction, by estimation, is 55 to 60%. The left ventricle has normal function. The left ventricle has no regional wall motion abnormalities. There is mild left ventricular hypertrophy. Left ventricular diastolic parameters are indeterminate.  2. Right ventricular systolic function is mildly reduced. The right ventricular size is normal. There is normal pulmonary artery systolic pressure. The estimated right ventricular systolic pressure is 50.0 mmHg.  3. Left atrial size was mildly dilated.  4. The mitral valve is abnormal. Trivial mitral valve regurgitation. No evidence of mitral stenosis.  5. The aortic valve is abnormal. Aortic valve regurgitation is trivial. Moderate aortic valve stenosis. Aortic valve mean gradient measures 20.0 mmHg.  6. The inferior vena cava is normal in size with greater than 50% respiratory variability, suggesting right atrial pressure of 3 mmHg  LHC 05/2020 The left ventricular systolic function is normal. LV end diastolic pressure is normal. The left ventricular ejection fraction is 50-55% by visual estimate. There is no aortic valve stenosis. Minimal, nobstructive CAD. Tortuous cororany arteries     Neuro/Psych negative neurological ROS  negative psych ROS   GI/Hepatic Neg liver ROS, GERD  ,  Endo/Other  diabetesHypothyroidism Obese BMI 39  Renal/GU negative Renal ROS  negative genitourinary   Musculoskeletal  (+) Arthritis ,   Abdominal   Peds  Hematology  (+) Blood dyscrasia (on eliquis), anemia ,   Anesthesia Other Findings   Reproductive/Obstetrics                             Anesthesia Physical  Anesthesia Plan  ASA: III  Anesthesia Plan: General   Post-op Pain Management:    Induction: Intravenous  PONV Risk Score and Plan: 3 and Treatment may vary due to age or medical condition  Airway Management Planned: Simple Face Mask, Natural Airway and Mask  Additional Equipment:   Intra-op Plan:   Post-operative Plan: Extubation in OR  Informed Consent: I have reviewed the patients History and Physical, chart, labs and discussed the procedure including the risks, benefits and alternatives for the proposed anesthesia with the patient or authorized representative who has indicated his/her understanding and acceptance.     Dental advisory given  Plan Discussed with: CRNA and Anesthesiologist  Anesthesia Plan Comments:         Anesthesia Quick Evaluation

## 2020-09-23 NOTE — Discharge Instructions (Signed)
Electrical Cardioversion Electrical cardioversion is the delivery of a jolt of electricity to restore a normal rhythm to the heart. A rhythm that is too fast or is not regular keeps the heart from pumping well. In this procedure, sticky patches or metal paddles are placed on the chest to deliver electricity to the heart from a device. This procedure may be done in an emergency if:  There is low or no blood pressure as a result of the heart rhythm.  Normal rhythm must be restored as fast as possible to protect the brain and heart from further damage.  It may save a life. This may also be a scheduled procedure for irregular or fast heart rhythms that are not immediately life-threatening. Tell a health care provider about:  Any allergies you have.  All medicines you are taking, including vitamins, herbs, eye drops, creams, and over-the-counter medicines.  Any problems you or family members have had with anesthetic medicines.  Any blood disorders you have.  Any surgeries you have had.  Any medical conditions you have.  Whether you are pregnant or may be pregnant. What are the risks? Generally, this is a safe procedure. However, problems may occur, including:  Allergic reactions to medicines.  A blood clot that breaks free and travels to other parts of your body.  The possible return of an abnormal heart rhythm within hours or days after the procedure.  Your heart stopping (cardiac arrest). This is rare. What happens before the procedure? Medicines  Your health care provider may have you start taking: ? Blood-thinning medicines (anticoagulants) so your blood does not clot as easily. ? Medicines to help stabilize your heart rate and rhythm.  Ask your health care provider about: ? Changing or stopping your regular medicines. This is especially important if you are taking diabetes medicines or blood thinners. ? Taking medicines such as aspirin and ibuprofen. These medicines can  thin your blood. Do not take these medicines unless your health care provider tells you to take them. ? Taking over-the-counter medicines, vitamins, herbs, and supplements. General instructions  Follow instructions from your health care provider about eating or drinking restrictions.  Plan to have someone take you home from the hospital or clinic.  If you will be going home right after the procedure, plan to have someone with you for 24 hours.  Ask your health care provider what steps will be taken to help prevent infection. These may include washing your skin with a germ-killing soap. What happens during the procedure?   An IV will be inserted into one of your veins.  Sticky patches (electrodes) or metal paddles may be placed on your chest.  You will be given a medicine to help you relax (sedative).  An electrical shock will be delivered. The procedure may vary among health care providers and hospitals. What can I expect after the procedure?  Your blood pressure, heart rate, breathing rate, and blood oxygen level will be monitored until you leave the hospital or clinic.  Your heart rhythm will be watched to make sure it does not change.  You may have some redness on the skin where the shocks were given. Follow these instructions at home:  Do not drive for 24 hours if you were given a sedative during your procedure.  Take over-the-counter and prescription medicines only as told by your health care provider.  Ask your health care provider how to check your pulse. Check it often.  Rest for 48 hours after the procedure or   as told by your health care provider.  Avoid or limit your caffeine use as told by your health care provider.  Keep all follow-up visits as told by your health care provider. This is important. Contact a health care provider if:  You feel like your heart is beating too quickly or your pulse is not regular.  You have a serious muscle cramp that does not go  away. Get help right away if:  You have discomfort in your chest.  You are dizzy or you feel faint.  You have trouble breathing or you are short of breath.  Your speech is slurred.  You have trouble moving an arm or leg on one side of your body.  Your fingers or toes turn cold or blue. Summary  Electrical cardioversion is the delivery of a jolt of electricity to restore a normal rhythm to the heart.  This procedure may be done right away in an emergency or may be a scheduled procedure if the condition is not an emergency.  Generally, this is a safe procedure.  After the procedure, check your pulse often as told by your health care provider. This information is not intended to replace advice given to you by your health care provider. Make sure you discuss any questions you have with your health care provider. Document Revised: 07/07/2019 Document Reviewed: 07/07/2019 Elsevier Patient Education  2020 [employer]  

## 2020-09-23 NOTE — Procedures (Signed)
Procedure: Electrical Cardioversion Indications:  Atrial Fibrillation  Procedure Details:  Consent: Risks of procedure as well as the alternatives and risks of each were explained to the (patient/caregiver).  Consent for procedure obtained.  Time Out: Verified patient identification, verified procedure, site/side was marked, verified correct patient position, special equipment/implants available, medications/allergies/relevent history reviewed, required imaging and test results available. PERFORMED.  Patient placed on cardiac monitor, pulse oximetry, supplemental oxygen as necessary.  Sedation given: propofol 70mg ; lidocaine 100mg  given by CV anesthesia Pacer pads placed anterior and posterior chest.  Cardioverted 4 time(s).  Cardioversion with synchronized biphasic 200J shock. Unfortunately the patient remained in Afib despite 4 shocks  Evaluation: Findings: Post procedure EKG shows: Atrial Flutter Complications: None Patient did tolerate procedure well.  Time Spent Directly with the Patient:  81minutes   Freada Bergeron 09/23/2020, 10:49 AM

## 2020-09-23 NOTE — Transfer of Care (Signed)
Immediate Anesthesia Transfer of Care Note  Patient: [name] [name]  Procedure(s) Performed: CARDIOVERSION (N/A )  Patient Location: Endoscopy Unit  Anesthesia Type:General  Level of Consciousness: drowsy and patient cooperative  Airway & Oxygen Therapy: Patient Spontanous Breathing  Post-op Assessment: Report given to RN and Post -op Vital signs reviewed and stable  Post vital signs: Reviewed and stable  Last Vitals:  Vitals Value Taken Time  BP 101/44 09/23/20 1048  Temp    Pulse 91 09/23/20 1049  Resp 20 09/23/20 1049  SpO2 93 % 09/23/20 1049    Last Pain:  Vitals:   09/23/20 1012  TempSrc: Oral  PainSc: 0-No pain         Complications: No complications documented.

## 2020-09-26 ENCOUNTER — Encounter (HOSPITAL_COMMUNITY): Payer: Self-pay | Admitting: Cardiology

## 2020-09-30 ENCOUNTER — Other Ambulatory Visit: Payer: Self-pay

## 2020-09-30 ENCOUNTER — Ambulatory Visit (HOSPITAL_COMMUNITY)
Admission: RE | Admit: 2020-09-30 | Discharge: 2020-09-30 | Disposition: A | Discharge status: Home / Self Care | Payer: Medicare Other | Source: Ambulatory Visit | Attending: Nurse Practitioner | Admitting: Nurse Practitioner

## 2020-09-30 VITALS — BP 134/78 | HR 89 | Ht 66.0 in | Wt 242.2 lb

## 2020-09-30 DIAGNOSIS — D649 Anemia, unspecified: Secondary | ICD-10-CM | POA: Insufficient documentation

## 2020-09-30 DIAGNOSIS — Z7901 Long term (current) use of anticoagulants: Secondary | ICD-10-CM | POA: Insufficient documentation

## 2020-09-30 DIAGNOSIS — I4819 Other persistent atrial fibrillation: Secondary | ICD-10-CM | POA: Diagnosis present

## 2020-09-30 DIAGNOSIS — M13811 Other specified arthritis, right shoulder: Secondary | ICD-10-CM | POA: Diagnosis not present

## 2020-09-30 DIAGNOSIS — I10 Essential (primary) hypertension: Secondary | ICD-10-CM | POA: Diagnosis not present

## 2020-09-30 DIAGNOSIS — M13869 Other specified arthritis, unspecified knee: Secondary | ICD-10-CM | POA: Diagnosis not present

## 2020-09-30 DIAGNOSIS — D6869 Other thrombophilia: Secondary | ICD-10-CM | POA: Diagnosis not present

## 2020-09-30 DIAGNOSIS — Z79899 Other long term (current) drug therapy: Secondary | ICD-10-CM | POA: Diagnosis not present

## 2020-09-30 DIAGNOSIS — E119 Type 2 diabetes mellitus without complications: Secondary | ICD-10-CM | POA: Diagnosis not present

## 2020-09-30 DIAGNOSIS — M13812 Other specified arthritis, left shoulder: Secondary | ICD-10-CM | POA: Insufficient documentation

## 2020-09-30 MED ORDER — FUROSEMIDE 20 MG PO TABS: ORAL_TABLET | ORAL | 6 refills | Status: DC

## 2020-09-30 MED ORDER — POTASSIUM CHLORIDE ER 20 MEQ PO TBCR: EXTENDED_RELEASE_TABLET | ORAL | 6 refills | Status: DC

## 2020-09-30 MED ORDER — APIXABAN 5 MG PO TABS: 5.0000 mg | ORAL_TABLET | Freq: Two times a day (BID) | ORAL | 6 refills | Status: DC

## 2020-09-30 MED ORDER — METOPROLOL TARTRATE 25 MG PO TABS: 25.0000 mg | ORAL_TABLET | Freq: Two times a day (BID) | ORAL | 6 refills | Status: DC

## 2020-10-01 ENCOUNTER — Encounter (HOSPITAL_COMMUNITY): Payer: Self-pay | Admitting: Nurse Practitioner

## 2020-10-01 NOTE — Progress Notes (Addendum)
Primary Care Physician: Glenis Smoker, MD Referring Physician:Acharya,Gayatri , MD   [name] is a 79 y.o. female with a h/o HTN, DM, that presented [facility] ED with chest pain and found to have new onset afib. She was admitted and had an echo that was unremarkable and a Left heart cath that showed mimimal nonobstructive CAD. She was discharfed in afib on diltiazem 120 mg qd and eliquis 5 mg bid for a CHA2DS2VASc score of 5.  In the afib clinic she remains in afib with RVR. Overall she feels ok.No  further chest pain. She does not drink alcohol, drinks several diet Pepsi's a day, no tobacco and denies snoring.  She does have intermittent LLE, Rt > Lt and was d/c on lasix as needed.   F/u in afib clinic 7/7. She is now approaching the full 3 week requirement of DOAC for  cardioversion. She  has chronic lower extremity edema which preceded afib but has required more lasix since in afib. She is rate controlled.   F/u in afib clinic 7/23. Unfortunately, DCCV was cancelled as pt started having hematuria the day before so it was cancelled until it could be determined source of bleeding as her U/A did not show any infection. She took an round of antibiotics and has not seen any more bleeding. DCCV will be rescheduled.She continues in rate controlled afib.   F/u in afib clinic, 09/14/20. It was discovered that hematuria was coming from  a kidney stone that was surgically removed 9/4. The hematuria stopped about a week after that. She is now back in  the afib clinic to have the cardioversion rescheduled. She had a delayed eliquis dose by 8 hours on 9/14 so will be scheduled 3 weeks after that date.   F/u in afib clinic, 10/01/20. Unfortunately, she failed cardioversion. Dr. Johney Frame( CV MD) discussed with her starting amiodarone and gave her an RX. However, pt got home and read  the SE and wanted to wait until today to further discuss. She is concerned because her daughter has long QT syndrome, had  cardiac arrest in the past and has an ICD. Other than that, she does not like the SE profile of drug. She has minimal CAD but I don't  have an EKG in SR to assess if she has any conduction issues in consideration for use of flecainide. Pt feels she has been in afib since spring of 2020, but was unaware. She does have fatigue and some mild LLE with afib. Since she has been in afib for a while I have my doubts that Multaq would be sufficient to restore and hold SR. She is not excited re hospital stay for Tikosyn and we again have the family history of long qt. Her qt in afib is around 480 ms which is not ideal for tikosyn.   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 Therefore, it would be best to have her renal issues resolved first so we do not to have to worry about stopping blood thinners if other options to restore SR are pursued. Due to the issues stated above, she may be a front line ablation candidate. .   Today, she denies symptoms of palpitations, chest pain, shortness of breath, orthopnea, PND, lower extremity edema, dizziness, presyncope, syncope, or neurologic sequela. The patient is tolerating medications without difficulties and  is otherwise without complaint today.   Past Medical History:  Diagnosis Date  . Anemia    hx of  . Arthritis    knee and shoulders  . Diabetes mellitus without complication (HCC)    diet controlledm no meds in 5-6 yrs  . Dyspnea    on exertion   . Dysrhythmia 05/2020   afib  . GERD (gastroesophageal reflux disease)   . Hematuria   . History of hypothyroidism 30 yrs ago  . History of kidney stones   . Hypertension    Past Surgical History:  Procedure Laterality Date  . ABDOMINAL HYSTERECTOMY  1992   complete  . BILATERAL CARPAL TUNNEL RELEASE  yrs ago  . CARDIOVERSION N/A 09/23/2020   Procedure: CARDIOVERSION;  Surgeon: Freada Bergeron, MD;  Location: [facility] ENDOSCOPY;  Service: Cardiovascular;  Laterality: N/A;  . CHOLECYSTECTOMY  1996  . CYSTOSCOPY/URETEROSCOPY/HOLMIUM LASER/STENT PLACEMENT Bilateral 08/19/2020   Procedure: CYSTOSCOPY BILATERAL  RETROGRADE  URETEROSCOPY/HOLMIUM LASER/STENT PLACEMENT;  Surgeon: Irine Seal, MD;  Location: [facility];  Service: Urology;  Laterality: Bilateral;  . HAMMER TOE SURGERY Right yrs ago  . LEFT HEART CATH AND CORONARY ANGIOGRAPHY N/A 06/02/2020   Procedure: LEFT HEART CATH AND CORONARY ANGIOGRAPHY;  Surgeon: Jettie Booze, MD;  Location: [facility];  Service: Cardiovascular;  Laterality: N/A;  . TONSILLECTOMY  age 28    Current Outpatient Medications  Medication Sig Dispense Refill  . acetaminophen (TYLENOL) 500 MG tablet Take 1,000 mg by mouth as needed for moderate pain or headache.     [name] apixaban (ELIQUIS) 5 MG TABS tablet Take 1 tablet (5 mg total) by mouth 2 (two) times daily. 60 tablet 6  . Calcium Carb-Cholecalciferol (CALCIUM 600+D) 600-800 MG-UNIT TABS Take 1 tablet by mouth daily.    . furosemide (LASIX) 20 MG tablet Take one tablet by mouth daily 30 tablet 6  . hydrocortisone cream 0.5 % Apply topically 3 (three) times daily. (Patient taking differently: Apply 1 application topically daily as needed for itching. ) 30 g 0  . Liniments (BLUE-EMU SUPER STRENGTH) CREA Apply 1 application topically daily.     [name] loperamide (IMODIUM A-D) 2 MG tablet Take 2-4 mg by mouth as needed for diarrhea or loose stools.     . metoprolol tartrate (LOPRESSOR) 25 MG tablet Take 1 tablet (25 mg total) by mouth 2 (two) times daily. 60 tablet 6  . Multiple Vitamins-Minerals (PRESERVISION AREDS 2 PO) Take 1 capsule by mouth 2 (two) times daily.     [name] nystatin (MYCOSTATIN/NYSTOP) powder Apply 1 application topically 3 (three) times daily. To rash in groin (Patient taking differently: Apply 1 application topically 2 (two) times daily. To rash in groin) 30 g 3  .  Potassium Chloride ER 20 MEQ TBCR Take one tablet by mouth daily 30 tablet 6   No current facility-administered medications for this encounter.    Allergies  Allergen Reactions  . Tetanus-Diphtheria Toxoids Td Other (See Comments)    Felt very sick  . Cardizem [Diltiazem] Rash    Social History   Socioeconomic History  . Marital status: Married    Spouse name: Not on file  . Number of children: Not on file  . Years of education: Not on file  . Highest education level: Not on file  Occupational History  . Not on file  Tobacco Use  . Smoking status: Never Smoker  . Smokeless tobacco: Never Used  Vaping Use  . Vaping Use: Never  used  Substance and Sexual Activity  . Alcohol use: Not Currently  . Drug use: Never  . Sexual activity: Not Currently  Other Topics Concern  . Not on file  Social History Narrative  . Not on file   Social Determinants of Health   Financial Resource Strain:   . Difficulty of Paying Living Expenses: Not on file  Food Insecurity:   . Worried About Charity fundraiser in the Last Year: Not on file  . Ran Out of Food in the Last Year: Not on file  Transportation Needs:   . Lack of Transportation (Medical): Not on file  . Lack of Transportation (Non-Medical): Not on file  Physical Activity:   . Days of Exercise per Week: Not on file  . Minutes of Exercise per Session: Not on file  Stress:   . Feeling of Stress : Not on file  Social Connections:   . Frequency of Communication with Friends and Family: Not on file  . Frequency of Social Gatherings with Friends and Family: Not on file  . Attends Religious Services: Not on file  . Active Member of Clubs or Organizations: Not on file  . Attends Archivist Meetings: Not on file  . Marital Status: Not on file  Intimate Partner Violence:   . Fear of Current or Ex-Partner: Not on file  . Emotionally Abused: Not on file  . Physically Abused: Not on file  . Sexually Abused: Not on file     Family History  Problem Relation Age of Onset  . Hypertension Mother   . Hypertension Father   . Diabetes Paternal Grandmother     ROS- All systems are reviewed and negative except as per the HPI above  Physical Exam: Vitals:   09/30/20 1440  BP: 134/78  Pulse: 89  Weight: 109.9 kg  Height: 5\' 6"  (1.676 m)   Wt Readings from Last 3 Encounters:  09/30/20 109.9 kg  09/23/20 110.7 kg  09/14/20 109.4 kg    Labs: Lab Results  Component Value Date   NA 142 09/14/2020   K 4.5 09/14/2020   CL 109 09/14/2020   CO2 24 09/14/2020   GLUCOSE 100 (H) 09/14/2020   BUN 21 09/14/2020   CREATININE 0.95 09/14/2020   CALCIUM 9.3 09/14/2020   PHOS 2.9 06/02/2020   MG 2.2 06/04/2020   No results found for: INR Lab Results  Component Value Date   CHOL 184 06/01/2020   HDL 51 06/01/2020   LDLCALC 120 (H) 06/01/2020   TRIG 67 06/01/2020     GEN- The patient is well appearing, alert and oriented x 3 today.   Head- normocephalic, atraumatic Eyes-  Sclera clear, conjunctiva pink Ears- hearing intact Oropharynx- clear Neck- supple, no JVP Lymph- no cervical lymphadenopathy Lungs- Clear to ausculation bilaterally, normal work of breathing Heart- irregular rate and rhythm, no murmurs, rubs or gallops, PMI not laterally displaced GI- soft, NT, ND, + BS Extremities- no clubbing, cyanosis, 2+ edema with statis dermatitis  MS- no significant deformity or atrophy Skin- no rash or lesion Psych- euthymic mood, full affect Neuro- strength and sensation are intact  EKG-afib at 89  mpm,  qrs int 94 ms, qtc 479   bms   LHC- The left ventricular systolic function is normal.  LV end diastolic pressure is normal.  The left ventricular ejection fraction is 50-55% by visual estimate.  There is no aortic valve stenosis.  Minimal, nobstructive CAD. Tortuous cororany arteries.   Echo-1. Left  ventricular ejection fraction, by estimation, is 55 to 60%. The  left ventricle has normal  function. The left ventricle has no regional  wall motion abnormalities. There is mild left ventricular hypertrophy.  Left ventricular diastolic parameters  are indeterminate.  2. Right ventricular systolic function is mildly reduced. The right  ventricular size is normal. There is normal pulmonary artery systolic  pressure. The estimated right ventricular systolic pressure is 36.6 mmHg.  3. Left atrial size was mildly dilated.  4. The mitral valve is abnormal. Trivial mitral valve regurgitation. No  evidence of mitral stenosis.  5. The aortic valve is abnormal. Aortic valve regurgitation is trivial.  Moderate aortic valve stenosis. Aortic valve mean gradient measures 20.0  mmHg.  6. The inferior vena cava is normal in size with greater than 50%  respiratory variability, suggesting right atrial pressure of 3 mmHg.   Assessment and Plan: 1. Persistent afib 9 new dx 05/2020 (but pt feels may have been present  since spring of 2020) Rate controlled Had to wait initially for DCCV as pt developed new onset hematuria  Stone removed 9/4, hematuria resolved and  also found a renal growth to be later addressed  Scheduled  cardioversion  10/7, did not shock out despite 4 shocks Discussed with her options going forward to restore SR, there are concerns re antiarrythmic's and will refer to EP MD to see if she would be a front line ablation candidate  Continue 120 mg Cardizem daily   2. CHA2DS2VASc score of  at least 5  Continue eliquis 5 mg bid   3. Renal growth Pending  further surgery Will have to wait 4 weeks after last cardioversion for surgery to be scheduled and I feel this needs to be resolved prior to further attempts to restore SR    Will refer to EP MD   Addendum- 10/27-  I sent my note to Dr. Quentin Ore and he answered that he wants to have her renal issues resolved before trying to return to SR.  After that, he will be happy to see her to discuss restoring SR.   Geroge Baseman  Oona Trammel, [facility] [address] [place], [place] [address] [phone]

## 2020-10-13 NOTE — Addendum Note (Signed)
Encounter addended by: Sherran Needs, NP on: 10/13/2020 10:54 AM  Actions taken: Clinical Note Signed

## 2020-10-22 ENCOUNTER — Other Ambulatory Visit: Payer: Self-pay | Admitting: Urology

## 2020-10-22 DIAGNOSIS — N2889 Other specified disorders of kidney and ureter: Secondary | ICD-10-CM

## 2020-10-26 ENCOUNTER — Encounter: Payer: Self-pay | Admitting: *Deleted

## 2020-10-26 ENCOUNTER — Other Ambulatory Visit: Payer: Self-pay

## 2020-10-26 ENCOUNTER — Ambulatory Visit
Admission: RE | Admit: 2020-10-26 | Discharge: 2020-10-26 | Disposition: A | Discharge status: Home / Self Care | Payer: Medicare Other | Source: Ambulatory Visit | Attending: Urology | Admitting: Urology

## 2020-10-26 DIAGNOSIS — N2889 Other specified disorders of kidney and ureter: Secondary | ICD-10-CM

## 2020-10-26 HISTORY — PX: IR RADIOLOGIST EVAL & MGMT: IMG5224

## 2020-10-26 NOTE — Consult Note (Signed)
Chief Complaint: Patient was seen in consultation today for left renal mass.  Referring Physician(s): Pace,Maryellen D  History of Present Illness: MARENDA [name] is a 79 y.o. female with history of gross hematuria and bilateral nephrolithiasis who was incidentally found to have a left renal mass on CT.  On 08/19/20 she underwent right ureteroscopic stone extraction and temporary double J stent placement.  Her left sided ureteral stone had passed spontaneously.  She underwent further evaluation of previously noted left renal mass with CT AP on 09/05/20 which demonstrated a 2.7 cm, partially exophytic, enhancing mass in the left posterior interpolar region concerning for renal cell carcinoma.  Hematuria has ceased since stone removal.  She denies fevers, chills, shortness of breath, chest pain, nausea, vomiting, flank pain, weight loss.  She was diagnosed with atrial fibrillation in June of this year, and underwent failed cardioversion.  She remains on Eliquis 5 mg BID.  Her Cardiologist has specifically noted to pursue workup and treatment of renal mass prior to additional procedural treatment for atrial fibrillation.    She is a retired Theme park manager, working at [employer] in [place] and [place].  She is married with 5 children.  She spends most of her days sewing and doing housework.     Past Medical History:  Diagnosis Date  . Anemia    hx of  . Arthritis    knee and shoulders  . Diabetes mellitus without complication (HCC)    diet controlledm no meds in 5-6 yrs  . Dyspnea    on exertion   . Dysrhythmia 05/2020   afib  . GERD (gastroesophageal reflux disease)   . Hematuria   . History of hypothyroidism 30 yrs ago  . History of kidney stones   . Hypertension     Past Surgical History:  Procedure Laterality Date  . ABDOMINAL HYSTERECTOMY  1992   complete  . BILATERAL CARPAL TUNNEL RELEASE  yrs ago  . CARDIOVERSION N/A 09/23/2020   Procedure: CARDIOVERSION;   Surgeon: Freada Bergeron, MD;  Location: [facility] ENDOSCOPY;  Service: Cardiovascular;  Laterality: N/A;  . CHOLECYSTECTOMY  1996  . CYSTOSCOPY/URETEROSCOPY/HOLMIUM LASER/STENT PLACEMENT Bilateral 08/19/2020   Procedure: CYSTOSCOPY BILATERAL  RETROGRADE  URETEROSCOPY/HOLMIUM LASER/STENT PLACEMENT;  Surgeon: Irine Seal, MD;  Location: [facility];  Service: Urology;  Laterality: Bilateral;  . HAMMER TOE SURGERY Right yrs ago  . LEFT HEART CATH AND CORONARY ANGIOGRAPHY N/A 06/02/2020   Procedure: LEFT HEART CATH AND CORONARY ANGIOGRAPHY;  Surgeon: Jettie Booze, MD;  Location: [facility];  Service: Cardiovascular;  Laterality: N/A;  . TONSILLECTOMY  age 58    Allergies: Tetanus-diphtheria toxoids td and Cardizem [diltiazem]  Medications: Prior to Admission medications   Medication Sig Start Date End Date Taking? Authorizing Provider  acetaminophen (TYLENOL) 500 MG tablet Take 1,000 mg by mouth as needed for moderate pain or headache.     [provider]  apixaban (ELIQUIS) 5 MG TABS tablet Take 1 tablet (5 mg total) by mouth 2 (two) times daily. 09/30/20   Sherran Needs, NP  Calcium Carb-Cholecalciferol (CALCIUM 600+D) 600-800 MG-UNIT TABS Take 1 tablet by mouth daily.    [provider]  furosemide (LASIX) 20 MG tablet Take one tablet by mouth daily 09/30/20   Sherran Needs, NP  hydrocortisone cream 0.5 % Apply topically 3 (three) times daily. Patient taking differently: Apply 1 application topically daily as needed for itching.  06/04/20   Lavina Hamman, MD  Liniments (BLUE-EMU SUPER STRENGTH) CREA Apply 1 application topically daily.     [provider]  loperamide (IMODIUM A-D) 2 MG tablet Take 2-4 mg by mouth as needed for diarrhea or loose stools.     [provider]  metoprolol tartrate (LOPRESSOR) 25 MG tablet Take 1 tablet (25 mg total) by mouth 2 (two) times daily. 09/30/20   Sherran Needs, NP  Multiple  Vitamins-Minerals (PRESERVISION AREDS 2 PO) Take 1 capsule by mouth 2 (two) times daily.     [provider]  nystatin (MYCOSTATIN/NYSTOP) powder Apply 1 application topically 3 (three) times daily. To rash in groin Patient taking differently: Apply 1 application topically 2 (two) times daily. To rash in groin 08/19/20   Irine Seal, MD  Potassium Chloride ER 20 MEQ TBCR Take one tablet by mouth daily 09/30/20   Sherran Needs, NP     Family History  Problem Relation Age of Onset  . Hypertension Mother   . Hypertension Father   . Diabetes Paternal Grandmother     Social History   Socioeconomic History  . Marital status: Married    Spouse name: Not on file  . Number of children: Not on file  . Years of education: Not on file  . Highest education level: Not on file  Occupational History  . Not on file  Tobacco Use  . Smoking status: Never Smoker  . Smokeless tobacco: Never Used  Vaping Use  . Vaping Use: Never used  Substance and Sexual Activity  . Alcohol use: Not Currently  . Drug use: Never  . Sexual activity: Not Currently  Other Topics Concern  . Not on file  Social History Narrative  . Not on file   Social Determinants of Health   Financial Resource Strain:   . Difficulty of Paying Living Expenses: Not on file  Food Insecurity:   . Worried About Charity fundraiser in the Last Year: Not on file  . Ran Out of Food in the Last Year: Not on file  Transportation Needs:   . Lack of Transportation (Medical): Not on file  . Lack of Transportation (Non-Medical): Not on file  Physical Activity:   . Days of Exercise per Week: Not on file  . Minutes of Exercise per Session: Not on file  Stress:   . Feeling of Stress : Not on file  Social Connections:   . Frequency of Communication with Friends and Family: Not on file  . Frequency of Social Gatherings with Friends and Family: Not on file  . Attends Religious Services: Not on file  . Active Member of Clubs or  Organizations: Not on file  . Attends Archivist Meetings: Not on file  . Marital Status: Not on file    ECOG Status: 0 - Asymptomatic  Review of Systems: A 12 point ROS discussed and pertinent positives are indicated in the HPI above.  All other systems are negative.  Review of Systems  Constitutional: Negative for appetite change, chills, fatigue and fever.  HENT: Negative.   Eyes: Negative.   Respiratory: Negative for shortness of breath.   Cardiovascular: Negative for chest pain.  Gastrointestinal: Negative for abdominal pain.  Endocrine: Negative.   Genitourinary: Negative for hematuria.  Musculoskeletal: Negative.   Skin: Negative.   Allergic/Immunologic: Negative.   Neurological: Negative.   Hematological: Negative.   Psychiatric/Behavioral: Negative.     Vital Signs: There were no vitals taken for this visit.  No physical examination in lieu  of telephone visit.      Imaging: CT AP 08/05/20:   US Renal 10/19/20:     Labs:  CBC: Recent Labs    06/04/20 0529 06/04/20 0529 06/23/20 1518 07/09/20 1215 08/19/20 0630 09/14/20 1536  WBC 9.1  --  8.0 9.5  --  8.7  HGB 12.7   < > 12.2 12.1 12.2 11.3*  HCT 39.6   < > 38.7 38.6 36.0 38.4  PLT 230  --  259 266  --  275   < > = values in this interval not displayed.    COAGS: No results for input(s): INR, APTT in the last 8760 hours.  BMP: Recent Labs    06/04/20 0529 06/04/20 0529 06/23/20 1518 07/09/20 1215 08/19/20 0630 09/14/20 1536  NA 139   < > 142 142 144 142  K 3.6   < > 4.1 3.4* 3.6 4.5  CL 109   < > 107 108 109 109  CO2 25  --  25 22  --  24  GLUCOSE 106*   < > 110* 123* 109* 100*  BUN 12   < > 14 13 16 21   CALCIUM 8.4*  --  8.9 9.2  --  9.3  CREATININE 0.77   < > 0.85 0.74 0.70 0.95  GFRNONAA >60  --  >60 >60  --  58*  GFRAA >60  --  >60 >60  --  >60   < > = values in this interval not displayed.    LIVER FUNCTION TESTS: Recent Labs    06/02/20 0450 06/04/20 0529    BILITOT 0.6 0.8  AST 28 25  ALT 21 22  ALKPHOS 62 71  PROT 6.1* 6.6  ALBUMIN 3.3* 3.6    TUMOR MARKERS: No results for input(s): AFPTM, CEA, CA199, CHROMGRNA in the last 8760 hours.  Assessment and Plan: 79 year old female with 2.7 cm left renal mass concerning for renal cell carcinoma.  Pertinent co-morbidities include atrial fibrillation status post failed electrical cardioversion on metoprolol and Eliquis, nephrolithiasis now without hematuria after stone extraction.  The mass and location are amenable to percutaneous microwave ablation.  We spoke about this, in addition to pre-ablation biopsy, including its benefits, risks, and alternatives.  She wishes to proceed with this plan.  -plan CT guided left renal mass biopsy and microwave ablation at [facility] -anesthesia: general -anticoagulation plan: will discuss management of anticoagulation holding for this procedure (needs to hold 4 doses prior to procedure per [facility] guidelines) with Cardiology -antibiotic prophylaxis:  Ceftriaxone 2 g pre-procedure -positioning: left lateral decubitus -disposition: plan for discharge home same day pending complications  Thank you for this interesting consult.  I greatly enjoyed meeting LAYLAH RIGA and look forward to participating in their care.  A copy of this report was sent to the requesting provider on this date.  Electronically Signed: Suzette Battiest 10/26/2020, 9:53 AM   I spent a total of  40 Minutes in telephone visit clinical consultation, greater than 50% of which was counseling/coordinating care for treatment of left renal mass.

## 2020-10-28 ENCOUNTER — Other Ambulatory Visit: Payer: Self-pay | Admitting: Family Medicine

## 2020-11-01 ENCOUNTER — Other Ambulatory Visit (HOSPITAL_COMMUNITY): Payer: Self-pay | Admitting: Interventional Radiology

## 2020-11-01 DIAGNOSIS — N2889 Other specified disorders of kidney and ureter: Secondary | ICD-10-CM

## 2020-11-15 ENCOUNTER — Other Ambulatory Visit: Payer: Self-pay | Admitting: Radiology

## 2020-11-15 ENCOUNTER — Encounter (HOSPITAL_COMMUNITY): Payer: Self-pay | Admitting: Interventional Radiology

## 2020-11-15 ENCOUNTER — Other Ambulatory Visit: Payer: Self-pay

## 2020-11-15 ENCOUNTER — Other Ambulatory Visit (HOSPITAL_COMMUNITY)
Admission: RE | Admit: 2020-11-15 | Discharge: 2020-11-15 | Disposition: A | Discharge status: Home / Self Care | Payer: Medicare Other | Source: Ambulatory Visit | Attending: Interventional Radiology | Admitting: Interventional Radiology

## 2020-11-15 DIAGNOSIS — Z20822 Contact with and (suspected) exposure to covid-19: Secondary | ICD-10-CM | POA: Insufficient documentation

## 2020-11-15 DIAGNOSIS — Z01812 Encounter for preprocedural laboratory examination: Secondary | ICD-10-CM | POA: Insufficient documentation

## 2020-11-15 LAB — SARS CORONAVIRUS 2 (TAT 6-24 HRS): SARS Coronavirus 2: NEGATIVE

## 2020-11-15 NOTE — Anesthesia Preprocedure Evaluation (Addendum)
Anesthesia Evaluation  Patient identified by MRN, date of birth, ID band Patient awake    Reviewed: Allergy & Precautions, NPO status , Patient's Chart, lab work & pertinent test results  Airway Mallampati: II  TM Distance: >3 FB Neck ROM: Full    Dental no notable dental hx.    Pulmonary neg pulmonary ROS,    Pulmonary exam normal breath sounds clear to auscultation       Cardiovascular hypertension, + dysrhythmias Atrial Fibrillation + Valvular Problems/Murmurs AS  Rhythm:Irregular Rate:Tachycardia     Neuro/Psych negative neurological ROS  negative psych ROS   GI/Hepatic negative GI ROS, Neg liver ROS,   Endo/Other  diabetesMorbid obesity  Renal/GU negative Renal ROS  negative genitourinary   Musculoskeletal negative musculoskeletal ROS (+)   Abdominal   Peds negative pediatric ROS (+)  Hematology negative hematology ROS (+)   Anesthesia Other Findings   Reproductive/Obstetrics negative OB ROS                          Anesthesia Physical Anesthesia Plan  ASA: III  Anesthesia Plan: General   Post-op Pain Management:    Induction: Intravenous  PONV Risk Score and Plan: 3 and Ondansetron, Dexamethasone and Treatment may vary due to age or medical condition  Airway Management Planned: Oral ETT  Additional Equipment:   Intra-op Plan:   Post-operative Plan: Extubation in OR  Informed Consent: I have reviewed the patients History and Physical, chart, labs and discussed the procedure including the risks, benefits and alternatives for the proposed anesthesia with the patient or authorized representative who has indicated his/her understanding and acceptance.     Dental advisory given  Plan Discussed with: CRNA and Surgeon  Anesthesia Plan Comments: (See PAT note 11/15/2020, Konrad Felix, PA-C)       Anesthesia Quick Evaluation

## 2020-11-15 NOTE — Progress Notes (Signed)
COVID Vaccine Completed:Yes Date COVID Vaccine completed:03/08/2020, 02/16/2020 COVID vaccine manufacturer: [place]      PCP - Dr. Glenis Smoker Cardiologist - Cherlynn Kaiser , MD, last office note by Roderic Palau NP 09/30/20 in epic  Chest x-ray - 06/01/20 in epic EKG -  09/30/20 in epic Stress Test -  N/A ECHO - 06/03/20 in epic Cardiac Cath - 06/02/20 in epic Pacemaker/ICD device last checked:  N/A  Sleep Study - N/A CPAP -  N/A  Fasting Blood Sugar -  N/A Checks Blood Sugar _ N/A____ times a day  Blood Thinner Instructions: Eliquis last dose 11/14/20 Aspirin Instructions:N/A Last Dose:N/A  Activity level:  Unable to go up a flight of stairs without symptoms      Anesthesia review: History of Cardioversion (unsuccessful), A. Fib/RVR  Patient denies shortness of breath, fever, cough and chest pain at PAT appointment   Patient verbalized understanding of instructions that were given to them at the PAT appointment. Patient was also instructed that they will need to review over the PAT instructions again at home before surgery.

## 2020-11-15 NOTE — Progress Notes (Signed)
Anesthesia Chart Review   Case: [id number] Date/Time: 11/17/20 0815   Procedure: RADIOLOGY WITH ANESTHESIA RENAL MICROWAVE ABLATION (N/A )   Anesthesia type: General   Pre-op diagnosis: LEFT RENAL MASS   [facility]   Surgeons: Suzette Battiest, MD      DISCUSSION:79 y.o. never smoker with h/o GERD, HTN, nonobstrucitve CAD, a-fib (on Eliquis, reports last dose 11/14/20), moderate AS (Aortic valve mean gradient measures 20.0 mmHg), DM II, left renal mass scheduled for above procedure 11/17/20 with Dr. Ruthann Cancer.    Persistent A-fib s/p failed cardioversion 09/23/20.  Pt continued on cardizem.  Per OV note 09/30/2020, "I sent my note to Dr. Quentin Ore and he answered that he wants to have her renal issues resolved before trying to return to SR.  After that, he will be happy to see her to discuss restoring SR."  Per cardiology preoperative risk assessment 08/09/2020, "Chart reviewed and patient contacted by phone today as part of pre-operative protocol coverage. Given past medical history and time since last visit, based on ACC/AHA guidelines, [name] [name] would be at acceptable risk for the planned procedure without further cardiovascular testing."  Anticipate pt can proceed with planned procedure barring acute status change and after evaluation DOS.  Pt is SDW.   VS: Ht 5\' 6"  (1.676 m)   Wt 111.1 kg   BMI 39.54 kg/m   PROVIDERS:  Glenis Smoker, MD is PCP   Cherlynn Kaiser, MD is Cardiologist  LABS: labs DOS (all labs ordered are listed, but only abnormal results are displayed)  Labs Reviewed - No data to display   IMAGES:   EKG: 09/30/20 Rate 89 bpm  Atrial flutter Cannot rule out anterior infarct, age undetermined Abnormal ECG No significant change since last tracing   CV: Echo 06/03/2020 IMPRESSIONS    1. Left ventricular ejection fraction, by estimation, is 55 to 60%. The  left ventricle has normal function. The left ventricle has no regional  wall  motion abnormalities. There is mild left ventricular hypertrophy.  Left ventricular diastolic parameters  are indeterminate.  2. Right ventricular systolic function is mildly reduced. The right  ventricular size is normal. There is normal pulmonary artery systolic  pressure. The estimated right ventricular systolic pressure is 02.7 mmHg.  3. Left atrial size was mildly dilated.  4. The mitral valve is abnormal. Trivial mitral valve regurgitation. No  evidence of mitral stenosis.  5. The aortic valve is abnormal. Aortic valve regurgitation is trivial.  Moderate aortic valve stenosis. Aortic valve mean gradient measures 20.0  mmHg.  6. The inferior vena cava is normal in size with greater than 50%  respiratory variability, suggesting right atrial pressure of 3 mmHg.   Cardiac Cath 06/02/2020  The left ventricular systolic function is normal.  LV end diastolic pressure is normal.  The left ventricular ejection fraction is 50-55% by visual estimate.  There is no aortic valve stenosis.  Minimal, nobstructive CAD. Tortuous cororany arteries.   OK to restart heparin 8 hours post sheath pull.   Continue preventive therapy.   Past Medical History:  Diagnosis Date  . Anemia    hx of  . Arthritis    knee and shoulders  . Atrial fibrillation ([place])   . Carpal tunnel syndrome on right   . Coronary artery disease 05/2020   Minimal, nobstructive CAD. Tortuous cororany arteries.  . Diabetes mellitus without complication (HCC)    diet controlled no meds in 5-6 yrs  . Dyspnea  on exertion   . Dysrhythmia 05/2020   afib  . GERD (gastroesophageal reflux disease)    No longer taking medications  . Hematuria   . History of hypothyroidism 30 yrs ago  . History of kidney stones   . Hypertension   . Lower extremity edema    chronic lower extremity edema   . Myocardial infarction [facility])    patient mention questionable MI  . Renal mass     Past Surgical History:  Procedure  Laterality Date  . ABDOMINAL HYSTERECTOMY  1992   complete  . BILATERAL CARPAL TUNNEL RELEASE  yrs ago  . CARDIOVERSION N/A 09/23/2020   Procedure: CARDIOVERSION;  Surgeon: Freada Bergeron, MD;  Location: [facility] ENDOSCOPY;  Service: Cardiovascular;  Laterality: N/A;  . CHOLECYSTECTOMY  1996  . CYSTOSCOPY/URETEROSCOPY/HOLMIUM LASER/STENT PLACEMENT Bilateral 08/19/2020   Procedure: CYSTOSCOPY BILATERAL  RETROGRADE  URETEROSCOPY/HOLMIUM LASER/STENT PLACEMENT;  Surgeon: Irine Seal, MD;  Location: [facility];  Service: Urology;  Laterality: Bilateral;  . HAMMER TOE SURGERY Right yrs ago  . IR RADIOLOGIST EVAL & MGMT  10/26/2020  . LEFT HEART CATH AND CORONARY ANGIOGRAPHY N/A 06/02/2020   Procedure: LEFT HEART CATH AND CORONARY ANGIOGRAPHY;  Surgeon: Jettie Booze, MD;  Location: [place] [facility];  Service: Cardiovascular;  Laterality: N/A;  . TONSILLECTOMY  age 8    MEDICATIONS: No current facility-administered medications for this encounter.   [name] acetaminophen (TYLENOL) 500 MG tablet  . apixaban (ELIQUIS) 5 MG TABS tablet  . Calcium Carb-Cholecalciferol (CALCIUM 600+D) 600-800 MG-UNIT TABS  . furosemide (LASIX) 20 MG tablet  . Hydrocortisone-Aloe Vera (CORTIZONE-10/ALOE EX)  . Liniments (BLUE-EMU SUPER STRENGTH) CREA  . loperamide (IMODIUM A-D) 2 MG tablet  . metoprolol tartrate (LOPRESSOR) 25 MG tablet  . Multiple Vitamin (MULTIVITAMIN WITH MINERALS) TABS tablet  . Multiple Vitamins-Minerals (PRESERVISION AREDS 2 PO)  . nystatin (MYCOSTATIN/NYSTOP) powder  . Potassium Chloride ER 20 MEQ TBCR  . hydrocortisone cream 0.5 %    Konrad Felix, PA-C [facility] Pre-Surgical Testing [phone]

## 2020-11-16 ENCOUNTER — Other Ambulatory Visit: Payer: Self-pay | Admitting: Radiology

## 2020-11-17 ENCOUNTER — Encounter (HOSPITAL_COMMUNITY)
Admission: RE | Disposition: A | Discharge status: Home / Self Care | Payer: Self-pay | Source: Home / Self Care | Attending: Interventional Radiology

## 2020-11-17 ENCOUNTER — Ambulatory Visit (HOSPITAL_COMMUNITY): Payer: Medicare Other | Admitting: Physician Assistant

## 2020-11-17 ENCOUNTER — Encounter (HOSPITAL_COMMUNITY): Payer: Self-pay | Admitting: Interventional Radiology

## 2020-11-17 ENCOUNTER — Encounter (HOSPITAL_COMMUNITY): Payer: Self-pay

## 2020-11-17 ENCOUNTER — Ambulatory Visit (HOSPITAL_COMMUNITY)
Admission: RE | Admit: 2020-11-17 | Discharge: 2020-11-17 | Disposition: A | Discharge status: Home / Self Care | Payer: Medicare Other | Source: Ambulatory Visit | Attending: Interventional Radiology | Admitting: Interventional Radiology

## 2020-11-17 ENCOUNTER — Other Ambulatory Visit: Payer: Self-pay

## 2020-11-17 ENCOUNTER — Ambulatory Visit (HOSPITAL_COMMUNITY)
Admission: RE | Admit: 2020-11-17 | Discharge: 2020-11-17 | Disposition: A | Discharge status: Home / Self Care | Payer: Medicare Other | Attending: Interventional Radiology | Admitting: Interventional Radiology

## 2020-11-17 DIAGNOSIS — Z79899 Other long term (current) drug therapy: Secondary | ICD-10-CM | POA: Diagnosis not present

## 2020-11-17 DIAGNOSIS — N2889 Other specified disorders of kidney and ureter: Secondary | ICD-10-CM

## 2020-11-17 DIAGNOSIS — C649 Malignant neoplasm of unspecified kidney, except renal pelvis: Secondary | ICD-10-CM | POA: Insufficient documentation

## 2020-11-17 DIAGNOSIS — Z7901 Long term (current) use of anticoagulants: Secondary | ICD-10-CM | POA: Diagnosis not present

## 2020-11-17 HISTORY — DX: Acute myocardial infarction, unspecified: I21.9

## 2020-11-17 HISTORY — DX: Other specified disorders of kidney and ureter: N28.89

## 2020-11-17 HISTORY — DX: Unspecified atrial fibrillation: I48.91

## 2020-11-17 HISTORY — DX: Carpal tunnel syndrome, right upper limb: G56.01

## 2020-11-17 HISTORY — PX: RADIOLOGY WITH ANESTHESIA: SHX6223

## 2020-11-17 HISTORY — DX: Localized edema: R60.0

## 2020-11-17 LAB — CBC WITH DIFFERENTIAL/PLATELET
Abs Immature Granulocytes: 0.03 10*3/uL (ref 0.00–0.07)
Basophils Absolute: 0.1 10*3/uL (ref 0.0–0.1)
Basophils Relative: 1 %
Eosinophils Absolute: 0.2 10*3/uL (ref 0.0–0.5)
Eosinophils Relative: 2 %
HCT: 36.7 % (ref 36.0–46.0)
Hemoglobin: 11.3 g/dL — ABNORMAL LOW (ref 12.0–15.0)
Immature Granulocytes: 0 %
Lymphocytes Relative: 17 %
Lymphs Abs: 1.7 10*3/uL (ref 0.7–4.0)
MCH: 26.2 pg (ref 26.0–34.0)
MCHC: 30.8 g/dL (ref 30.0–36.0)
MCV: 85.2 fL (ref 80.0–100.0)
Monocytes Absolute: 0.8 10*3/uL (ref 0.1–1.0)
Monocytes Relative: 8 %
Neutro Abs: 7.1 10*3/uL (ref 1.7–7.7)
Neutrophils Relative %: 72 %
Platelets: 245 10*3/uL (ref 150–400)
RBC: 4.31 MIL/uL (ref 3.87–5.11)
RDW: 17.2 % — ABNORMAL HIGH (ref 11.5–15.5)
WBC: 10 10*3/uL (ref 4.0–10.5)
nRBC: 0 % (ref 0.0–0.2)

## 2020-11-17 LAB — ABO/RH: ABO/RH(D): A NEG

## 2020-11-17 LAB — BASIC METABOLIC PANEL
Anion gap: 9 (ref 5–15)
BUN: 24 mg/dL — ABNORMAL HIGH (ref 8–23)
CO2: 22 mmol/L (ref 22–32)
Calcium: 9 mg/dL (ref 8.9–10.3)
Chloride: 111 mmol/L (ref 98–111)
Creatinine, Ser: 0.8 mg/dL (ref 0.44–1.00)
GFR, Estimated: >60 mL/min (ref >60)
Glucose, Bld: 167 mg/dL — ABNORMAL HIGH (ref 70–99)
Potassium: 3.7 mmol/L (ref 3.5–5.1)
Sodium: 142 mmol/L (ref 135–145)

## 2020-11-17 LAB — PROTIME-INR
INR: 1.2 (ref 0.8–1.2)
Prothrombin Time: 14.4 seconds (ref 11.4–15.2)

## 2020-11-17 LAB — TYPE AND SCREEN
ABO/RH(D): A NEG
Antibody Screen: NEGATIVE

## 2020-11-17 LAB — HEMOGLOBIN A1C
Hgb A1c MFr Bld: 6.2 % — ABNORMAL HIGH (ref 4.8–5.6)
Mean Plasma Glucose: 131.24 mg/dL

## 2020-11-17 SURGERY — RADIOLOGY WITH ANESTHESIA
Anesthesia: General

## 2020-11-17 MED ORDER — HYDROMORPHONE HCL 1 MG/ML IJ SOLN: 0.2500 mg | INTRAMUSCULAR | Status: DC | PRN

## 2020-11-17 MED ORDER — PHENYLEPHRINE HCL-NACL 10-0.9 MG/250ML-% IV SOLN
INTRAVENOUS | Status: AC
  Filled 2020-11-17: qty 250

## 2020-11-17 MED ORDER — OXYCODONE HCL 5 MG PO TABS: 5.0000 mg | ORAL_TABLET | Freq: Once | ORAL | Status: DC | PRN

## 2020-11-17 MED ORDER — ROCURONIUM BROMIDE 10 MG/ML (PF) SYRINGE
PREFILLED_SYRINGE | INTRAVENOUS | Status: DC | PRN
  Administered 2020-11-17: 70 mg via INTRAVENOUS
  Administered 2020-11-17: 20 mg via INTRAVENOUS

## 2020-11-17 MED ORDER — PHENYLEPHRINE HCL-NACL 10-0.9 MG/250ML-% IV SOLN
INTRAVENOUS | Status: DC | PRN
  Administered 2020-11-17: 50 ug/min via INTRAVENOUS

## 2020-11-17 MED ORDER — LACTATED RINGERS IV SOLN: INTRAVENOUS | Status: DC

## 2020-11-17 MED ORDER — PHENYLEPHRINE 40 MCG/ML (10ML) SYRINGE FOR IV PUSH (FOR BLOOD PRESSURE SUPPORT)
PREFILLED_SYRINGE | INTRAVENOUS | Status: DC | PRN
  Administered 2020-11-17 (×2): 80 ug via INTRAVENOUS
  Administered 2020-11-17: 40 ug via INTRAVENOUS
  Administered 2020-11-17 (×2): 80 ug via INTRAVENOUS

## 2020-11-17 MED ORDER — FENTANYL CITRATE (PF) 250 MCG/5ML IJ SOLN
INTRAMUSCULAR | Status: DC | PRN
  Administered 2020-11-17: 50 ug via INTRAVENOUS

## 2020-11-17 MED ORDER — FENTANYL CITRATE (PF) 250 MCG/5ML IJ SOLN
INTRAMUSCULAR | Status: AC
  Filled 2020-11-17: qty 5

## 2020-11-17 MED ORDER — ONDANSETRON HCL 4 MG/2ML IJ SOLN
INTRAMUSCULAR | Status: DC | PRN
  Administered 2020-11-17: 4 mg via INTRAVENOUS

## 2020-11-17 MED ORDER — HYDROCODONE-ACETAMINOPHEN 5-325 MG PO TABS: 1.0000 | ORAL_TABLET | ORAL | Status: DC | PRN

## 2020-11-17 MED ORDER — DEXAMETHASONE SODIUM PHOSPHATE 10 MG/ML IJ SOLN
INTRAMUSCULAR | Status: DC | PRN
  Administered 2020-11-17: 4 mg via INTRAVENOUS

## 2020-11-17 MED ORDER — SODIUM CHLORIDE 0.9 % IV SOLN
2.0000 g | Freq: Once | INTRAVENOUS | Status: AC
  Administered 2020-11-17: 2 g via INTRAVENOUS
  Filled 2020-11-17: qty 20

## 2020-11-17 MED ORDER — PROPOFOL 10 MG/ML IV BOLUS
INTRAVENOUS | Status: DC | PRN
  Administered 2020-11-17: 120 mg via INTRAVENOUS

## 2020-11-17 MED ORDER — LIDOCAINE 2% (20 MG/ML) 5 ML SYRINGE
INTRAMUSCULAR | Status: DC | PRN
  Administered 2020-11-17: 100 mg via INTRAVENOUS

## 2020-11-17 MED ORDER — ORAL CARE MOUTH RINSE: 15.0000 mL | Freq: Once | OROMUCOSAL | Status: AC

## 2020-11-17 MED ORDER — ACETAMINOPHEN 10 MG/ML IV SOLN: 1000.0000 mg | Freq: Once | INTRAVENOUS | Status: DC | PRN

## 2020-11-17 MED ORDER — OXYCODONE HCL 5 MG/5ML PO SOLN: 5.0000 mg | Freq: Once | ORAL | Status: DC | PRN

## 2020-11-17 MED ORDER — SUGAMMADEX SODIUM 200 MG/2ML IV SOLN
INTRAVENOUS | Status: DC | PRN
  Administered 2020-11-17: 200 mg via INTRAVENOUS

## 2020-11-17 MED ORDER — CHLORHEXIDINE GLUCONATE 0.12 % MT SOLN
15.0000 mL | Freq: Once | OROMUCOSAL | Status: AC
  Administered 2020-11-17: 15 mL via OROMUCOSAL

## 2020-11-17 NOTE — Anesthesia Procedure Notes (Signed)
Procedure Name: Intubation Date/Time: 11/17/2020 8:51 AM Performed by: Eben Burow, CRNA Pre-anesthesia Checklist: Patient identified, Emergency Drugs available, Suction available, Patient being monitored and Timeout performed Patient Re-evaluated:Patient Re-evaluated prior to induction Oxygen Delivery Method: Circle system utilized Preoxygenation: Pre-oxygenation with 100% oxygen Induction Type: IV induction Ventilation: Mask ventilation without difficulty Laryngoscope Size: Mac and 4 Grade View: Grade I Tube type: Oral Tube size: 7.0 mm Number of attempts: 1 Airway Equipment and Method: Stylet Placement Confirmation: ETT inserted through vocal cords under direct vision,  positive ETCO2 and breath sounds checked- equal and bilateral Secured at: 22 cm Tube secured with: Tape Dental Injury: Teeth and Oropharynx as per pre-operative assessment

## 2020-11-17 NOTE — H&P (Signed)
Referring Physician(s): Pace,M  Supervising Physician: Suttle,D  Patient Status:  [facility] OP TBA  Chief Complaint: Left renal mass   Subjective: Patient familiar to IR service from consultation with Dr. Serafina Royals on 10/26/2020 to discuss treatment options for a 2.7 cm left renal mass suspicious for renal cell carcinoma. She was deemed an appropriate candidate for CT-guided microwave ablation and possible biopsy of the mass and presents today for the procedure.  She currently denies fever, headache, chest pain, abdominal/back pain, nausea, vomiting or bleeding.  She does have some chronic dyspnea and occasional cough.  She also has a history of atrial fibrillation, is on Eliquis with last dose this past Sunday.  Additional medical history as below.  Past Medical History:  Diagnosis Date  . Anemia    hx of  . Arthritis    knee and shoulders  . Atrial fibrillation ([place])   . Carpal tunnel syndrome on right   . Coronary artery disease 05/2020   Minimal, nobstructive CAD. Tortuous cororany arteries.  . Diabetes mellitus without complication (HCC)    diet controlled no meds in 5-6 yrs  . Dyspnea    on exertion   . Dysrhythmia 05/2020   afib  . GERD (gastroesophageal reflux disease)    No longer taking medications  . Hematuria   . History of hypothyroidism 30 yrs ago  . History of kidney stones   . Hypertension   . Lower extremity edema    chronic lower extremity edema   . Myocardial infarction [facility])    patient mention questionable MI  . Renal mass    Past Surgical History:  Procedure Laterality Date  . ABDOMINAL HYSTERECTOMY  1992   complete  . BILATERAL CARPAL TUNNEL RELEASE  yrs ago  . CARDIOVERSION N/A 09/23/2020   Procedure: CARDIOVERSION;  Surgeon: Freada Bergeron, MD;  Location[facility] END[facility]COPY;  Service: Cardiovascular;  Laterality: N/A;  . CHOLECYSTECTOMY  1996  . CYSTOSCOPY/URETEROSCOPY/HOLMIUM LASER/STENT PLACEMENT Bilateral 08/19/2020   Procedure: CYSTOSCOPY  BILATERAL  RETROGRADE  URETEROSCOPY/HOLMIUM LASER/STENT PLACEMENT;  Surgeon: Irine Seal, MD;  Location: [facility];  Service: Urology;  Laterality: Bilateral;  . HAMMER TOE SURGERY Right yrs ago  . IR RADIOLOGIST EVAL & MGMT  10/26/2020  . LEFT HEART CATH AND CORONARY ANGIOGRAPHY N/A 06/02/2020   Procedure: LEFT HEART CATH AND CORONARY ANGIOGRAPHY;  Surgeon: Jettie Booze, MD;  Location: [place] CV LAB;  Service: Cardiovascular;  Laterality: N/A;  . TONSILLECTOMY  age 79      Allergies: Tetanus-diphtheria toxoids td and Cardizem [diltiazem]  Medications: Prior to Admission medications   Medication Sig Start Date End Date Taking? Authorizing Provider  acetaminophen (TYLENOL) 500 MG tablet Take 500-1,000 mg by mouth every 6 (six) hours as needed for moderate pain or headache.    Yes [provider]  apixaban (ELIQUIS) 5 MG TABS tablet Take 1 tablet (5 mg total) by mouth 2 (two) times daily. 09/30/20  Yes Sherran Needs, NP  Calcium Carb-Cholecalciferol (CALCIUM 600+D) 600-800 MG-UNIT TABS Take 1 tablet by mouth every evening.    Yes [provider]  furosemide (LASIX) 20 MG tablet Take one tablet by mouth daily Patient taking differently: Take 20 mg by mouth daily.  09/30/20  Yes Sherran Needs, NP  Hydrocortisone-Aloe Vera (NLZJQBHAL-93/XTKW EX) Apply 1 application topically in the morning and at bedtime.   Yes [provider]  Liniments (BLUE-EMU SUPER STRENGTH) CREA Apply 1 application topically 2 (two) times daily as needed (pain.).  Yes [provider]  loperamide (IMODIUM A-D) 2 MG tablet Take 2-4 mg by mouth as needed for diarrhea or loose stools.    Yes [provider]  metoprolol tartrate (LOPRESSOR) 25 MG tablet Take 1 tablet (25 mg total) by mouth 2 (two) times daily. 09/30/20  Yes Sherran Needs, NP  Multiple Vitamin (MULTIVITAMIN WITH MINERALS) TABS tablet Take 1 tablet by mouth daily.   Yes [provider]  Multiple Vitamins-Minerals (PRESERVISION AREDS 2 PO) Take 1 capsule by mouth 2 (two) times daily.    Yes [provider]  nystatin (MYCOSTATIN/NYSTOP) powder Apply 1 application topically 3 (three) times daily. To rash in groin Patient taking differently: Apply 1 application topically 2 (two) times daily as needed (rash/irritation.).  08/19/20  Yes Irine Seal, MD  Potassium Chloride ER 20 MEQ TBCR Take one tablet by mouth daily Patient taking differently: Take 20 mEq by mouth daily.  09/30/20  Yes Sherran Needs, NP  hydrocortisone cream 0.5 % Apply topically 3 (three) times daily. Patient not taking: Reported on 11/15/2020 06/04/20   Lavina Hamman, MD     Vital Signs: BP 121/81   Pulse 95   Temp 97.9 F (36.6 C) (Oral)   Resp 18   Ht 5\' 5"  (1.651 m)   Wt 252 lb 12.8 oz (114.7 kg)   SpO2 95%   BMI 42.07 kg/m   Physical Exam awake, alert.  Chest clear to auscultation bilaterally.  Heart with irregularly irregular rhythm.  Abdomen soft, positive bowel sounds, nontender.  Bilateral pretibial edema noted.  Imaging: No results found.  Labs:  CBC: Recent Labs    06/23/20 1518 06/23/20 1518 07/09/20 1215 08/19/20 0630 09/14/20 1536 11/17/20 0724  WBC 8.0  --  9.5  --  8.7 10.0  HGB 12.2   < > 12.1 12.2 11.3* 11.3*  HCT 38.7   < > 38.6 36.0 38.4 36.7  PLT 259  --  266  --  275 245   < > = values in this interval not displayed.    COAGS: Recent Labs    11/17/20 0724  INR 1.2    BMP: Recent Labs    06/04/20 0529 06/04/20 0529 06/23/20 1518 06/23/20 1518 07/09/20 1215 08/19/20 0630 09/14/20 1536 11/17/20 0724  NA 139   < > 142   < > 142 144 142 142  K 3.6   < > 4.1   < > 3.4* 3.6 4.5 3.7  CL 109   < > 107   < > 108 109 109 111  CO2 25   < > 25  --  22  --  24 22  GLUCOSE 106*   < > 110*   < > 123* 109* 100* 167*  BUN 12   < > 14   < > 13 16 21  24*  CALCIUM 8.4*   < > 8.9  --  9.2  --  9.3 9.0  CREATININE 0.77   < > 0.85   < > 0.74  0.70 0.95 0.80  GFRNONAA >60   < > >60  --  >60  --  58* >60  GFRAA >60  --  >60  --  >60  --  >60  --    < > = values in this interval not displayed.    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  She underwent consultation with Dr.Suttle on 10/26/2020 to discuss treatment options for the left renal mass.  She was deemed an appropriate candidate for CT-guided microwave ablation with possible biopsy of the mass and presents today for the procedure.  Details/risks of procedure, including but not limited to, internal bleeding, infection, injury to adjacent structures, anesthesia related complications discussed with patient with her understanding and consent.   Electronically Signed: D. Rowe Robert, PA-C 11/17/2020, 8:02 AM   I spent a total of 30 minutes at the the patient's bedside AND on the patient's hospital floor or unit, greater than 50% of which was counseling/coordinating care for CT-guided microwave ablation/ possible biopsy of left renal mass

## 2020-11-17 NOTE — Procedures (Signed)
Interventional Radiology Procedure Note  Procedure:  1) CT guided percutaneous biopsy of left renal mass 2) CT guided microwave ablation of left renal mass  Findings: Please refer to procedural dictation for full description.  Complications: None immediate  Estimated Blood Loss: <5 mL  Recommendations: Strict bedrest for 3 hours. Advance diet as tolerated. Plan to discharge home after 4 hours, if stable.  Otherwise admit for overnight observation. Follow up Pathology results. Follow up in IR clinic in 1 month with renal mass protocol CT abdomen   Ruthann Cancer, MD Pager: [phone]

## 2020-11-17 NOTE — Sedation Documentation (Signed)
Anesthesia in to sedate and monitor. 

## 2020-11-17 NOTE — Transfer of Care (Signed)
Immediate Anesthesia Transfer of Care Note  Patient: [name] [name]  Procedure(s) Performed: RADIOLOGY WITH ANESTHESIA RENAL MICROWAVE ABLATION (N/A )  Patient Location: PACU  Anesthesia Type:General  Level of Consciousness: awake, alert  and patient cooperative  Airway & Oxygen Therapy: Patient Spontanous Breathing and Patient connected to face mask oxygen  Post-op Assessment: Report given to RN and Post -op Vital signs reviewed and stable  Post vital signs: Reviewed and stable  Last Vitals:  Vitals Value Taken Time  BP 146/99 11/17/20 1032  Temp    Pulse 100 11/17/20 1034  Resp 21 11/17/20 1034  SpO2 100 % 11/17/20 1034  Vitals shown include unvalidated device data.  Last Pain:  Vitals:   11/17/20 0720  TempSrc: Oral  PainSc:          Complications: No complications documented.

## 2020-11-18 ENCOUNTER — Encounter (HOSPITAL_COMMUNITY): Payer: Self-pay | Admitting: Interventional Radiology

## 2020-11-18 LAB — SURGICAL PATHOLOGY

## 2020-11-18 NOTE — Anesthesia Postprocedure Evaluation (Signed)
Anesthesia Post Note  Patient: [name] [name]  Procedure(s) Performed: RADIOLOGY WITH ANESTHESIA RENAL MICROWAVE ABLATION (N/A )     Patient location during evaluation: PACU Anesthesia Type: General Level of consciousness: awake and alert Pain management: pain level controlled Vital Signs Assessment: post-procedure vital signs reviewed and stable Respiratory status: spontaneous breathing, nonlabored ventilation, respiratory function stable and patient connected to nasal cannula oxygen Cardiovascular status: blood pressure returned to baseline and stable Postop Assessment: no apparent nausea or vomiting Anesthetic complications: no   No complications documented.  Last Vitals:  Vitals:   11/17/20 1219 11/17/20 1352  BP: 120/62 (!) 144/93  Pulse: 99 (!) 103  Resp: 16   Temp: 36.7 C   SpO2: 92% 96%    Last Pain:  Vitals:   11/17/20 1219  TempSrc: Oral  PainSc: 0-No pain                 Artavius Stearns S

## 2021-01-06 ENCOUNTER — Other Ambulatory Visit: Payer: Self-pay

## 2021-01-06 ENCOUNTER — Encounter: Payer: Self-pay | Admitting: *Deleted

## 2021-01-06 ENCOUNTER — Ambulatory Visit
Admission: RE | Admit: 2021-01-06 | Discharge: 2021-01-06 | Disposition: A | Discharge status: Home / Self Care | Payer: Medicare Other | Source: Ambulatory Visit | Attending: Radiology | Admitting: Radiology

## 2021-01-06 DIAGNOSIS — Z9889 Other specified postprocedural states: Secondary | ICD-10-CM | POA: Diagnosis not present

## 2021-01-06 DIAGNOSIS — C642 Malignant neoplasm of left kidney, except renal pelvis: Secondary | ICD-10-CM | POA: Diagnosis not present

## 2021-01-06 DIAGNOSIS — N2889 Other specified disorders of kidney and ureter: Secondary | ICD-10-CM

## 2021-01-06 HISTORY — PX: IR RADIOLOGIST EVAL & MGMT: IMG5224

## 2021-01-06 NOTE — Progress Notes (Signed)
Chief Complaint: Left Renal Tumor, SP Ablation  Referring Physician(s): Allred,Darrell K  History of Present Illness: [name] [name] is a 80 y.o. female presenting as a scheduled follow up to [place] clinic today, SP treatment of left renal tumor with biopsy.   She joins [place] today virtually, and we confirmed her identity with 2 personal identifiers.   She was treated by my partner, Dr. Serafina Royals, on 11/17/2020, with image guided biopsy and microwave ablation of left renal tumor.  This path was:   She was discharged home the same day.  She tells me she recovered fine, with no problems.  She denies hematuria, flank pain, or any other discomfort.  She denies needing to take any medication at home.   She has resumed normal activity.     Past Medical History:  Diagnosis Date  . Anemia    hx of  . Arthritis    knee and shoulders  . Atrial fibrillation ([place])   . Carpal tunnel syndrome on right   . Coronary artery disease 05/2020   Minimal, nobstructive CAD. Tortuous cororany arteries.  . Diabetes mellitus without complication (HCC)    diet controlled no meds in 5-6 yrs  . Dyspnea    on exertion   . Dysrhythmia 05/2020   afib  . GERD (gastroesophageal reflux disease)    No longer taking medications  . Hematuria   . History of hypothyroidism 30 yrs ago  . History of kidney stones   . Hypertension   . Lower extremity edema    chronic lower extremity edema   . Myocardial infarction [facility])    patient mention questionable MI  . Renal mass     Past Surgical History:  Procedure Laterality Date  . ABDOMINAL HYSTERECTOMY  1992   complete  . BILATERAL CARPAL TUNNEL RELEASE  yrs ago  . CARDIOVERSION N/A 09/23/2020   Procedure: CARDIOVERSION;  Surgeon: Freada Bergeron, MD;  Location: [facility] ENDOSCOPY;  Service: Cardiovascular;  Laterality: N/A;  . CHOLECYSTECTOMY  1996  . CYSTOSCOPY/URETEROSCOPY/HOLMIUM LASER/STENT PLACEMENT Bilateral 08/19/2020   Procedure: CYSTOSCOPY BILATERAL   RETROGRADE  URETEROSCOPY/HOLMIUM LASER/STENT PLACEMENT;  Surgeon: Irine Seal, MD;  Location: [facility];  Service: Urology;  Laterality: Bilateral;  . HAMMER TOE SURGERY Right yrs ago  . IR RADIOLOGIST EVAL & MGMT  10/26/2020  . LEFT HEART CATH AND CORONARY ANGIOGRAPHY N/A 06/02/2020   Procedure: LEFT HEART CATH AND CORONARY ANGIOGRAPHY;  Surgeon: Jettie Booze, MD;  Location: [place] CV LAB;  Service: Cardiovascular;  Laterality: N/A;  . RADIOLOGY WITH ANESTHESIA N/A 11/17/2020   Procedure: RADIOLOGY WITH ANESTHESIA RENAL MICROWAVE ABLATION;  Surgeon: Suzette Battiest, MD;  Location: [facility];  Service: Radiology;  Laterality: N/A;  . TONSILLECTOMY  age 89    Allergies: Tetanus-diphtheria toxoids td and Cardizem [diltiazem]  Medications: Prior to Admission medications   Medication Sig Start Date End Date Taking? Authorizing Provider  acetaminophen (TYLENOL) 500 MG tablet Take 500-1,000 mg by mouth every 6 (six) hours as needed for moderate pain or headache.     [provider]  apixaban (ELIQUIS) 5 MG TABS tablet Take 1 tablet (5 mg total) by mouth 2 (two) times daily. 09/30/20   Sherran Needs, NP  Calcium Carb-Cholecalciferol (CALCIUM 600+D) 600-800 MG-UNIT TABS Take 1 tablet by mouth every evening.     [provider]  furosemide (LASIX) 20 MG tablet Take one tablet by mouth daily Patient taking differently: Take 20 mg by mouth daily.  09/30/20   Sherran Needs, NP  hydrocortisone cream 0.5 % Apply topically 3 (three) times daily. Patient not taking: Reported on 11/15/2020 06/04/20   Lavina Hamman, MD  Hydrocortisone-Aloe Vera (NLGXQJJHE-17/EYCX EX) Apply 1 application topically in the morning and at bedtime.    [provider]  Liniments (BLUE-EMU SUPER STRENGTH) CREA Apply 1 application topically 2 (two) times daily as needed (pain.).     [provider]  loperamide (IMODIUM A-D) 2 MG tablet Take 2-4 mg by mouth as needed  for diarrhea or loose stools.     [provider]  metoprolol tartrate (LOPRESSOR) 25 MG tablet Take 1 tablet (25 mg total) by mouth 2 (two) times daily. 09/30/20   Sherran Needs, NP  Multiple Vitamin (MULTIVITAMIN WITH MINERALS) TABS tablet Take 1 tablet by mouth daily.    [provider]  Multiple Vitamins-Minerals (PRESERVISION AREDS 2 PO) Take 1 capsule by mouth 2 (two) times daily.     [provider]  nystatin (MYCOSTATIN/NYSTOP) powder Apply 1 application topically 3 (three) times daily. To rash in groin Patient taking differently: Apply 1 application topically 2 (two) times daily as needed (rash/irritation.).  08/19/20   Irine Seal, MD  Potassium Chloride ER 20 MEQ TBCR Take one tablet by mouth daily Patient taking differently: Take 20 mEq by mouth daily.  09/30/20   Sherran Needs, NP     Family History  Problem Relation Age of Onset  . Hypertension Mother   . Hypertension Father   . Diabetes Paternal Grandmother     Social History   Socioeconomic History  . Marital status: Married    Spouse name: Not on file  . Number of children: Not on file  . Years of education: Not on file  . Highest education level: Not on file  Occupational History  . Not on file  Tobacco Use  . Smoking status: Never Smoker  . Smokeless tobacco: Never Used  Vaping Use  . Vaping Use: Never used  Substance and Sexual Activity  . Alcohol use: Not Currently  . Drug use: Never  . Sexual activity: Not Currently    Birth control/protection: Surgical  Other Topics Concern  . Not on file  Social History Narrative  . Not on file   Social Determinants of Health   Financial Resource Strain: Not on file  Food Insecurity: Not on file  Transportation Needs: Not on file  Physical Activity: Not on file  Stress: Not on file  Social Connections: Not on file       Review of Systems  Review of Systems: A 12 point ROS discussed and pertinent positives are indicated in  the HPI above.  All other systems are negative.  Physical Exam No direct physical exam was performed (except for noted visual exam findings with Video Visits).     Vital Signs: There were no vitals taken for this visit.  Imaging: No results found.  Labs:  CBC: Recent Labs    06/23/20 1518 07/09/20 1215 08/19/20 0630 09/14/20 1536 11/17/20 0724  WBC 8.0 9.5  --  8.7 10.0  HGB 12.2 12.1 12.2 11.3* 11.3*  HCT 38.7 38.6 36.0 38.4 36.7  PLT 259 266  --  275 245    COAGS: Recent Labs    11/17/20 0724  INR 1.2    BMP: Recent Labs    06/04/20 0529 06/23/20 1518 07/09/20 1215 08/19/20 0630 09/14/20 1536 11/17/20 0724  NA 139 142 142 144 142 142  K 3.6 4.1 3.4* 3.6 4.5 3.7  CL 109 107 108 109 109 111  CO2 25 25 22   --  24 22  GLUCOSE 106* 110* 123* 109* 100* 167*  BUN 12 14 13 16 21  24*  CALCIUM 8.4* 8.9 9.2  --  9.3 9.0  CREATININE 0.77 0.85 0.74 0.70 0.95 0.80  GFRNONAA >60 >60 >60  --  58* >60  GFRAA >60 >60 >60  --  >60  --     LIVER FUNCTION TESTS: Recent Labs    06/02/20 0450 06/04/20 0529  BILITOT 0.6 0.8  AST 28 25  ALT 21 22  ALKPHOS 62 71  PROT 6.1* 6.6  ALBUMIN 3.3* 3.6    TUMOR MARKERS: No results for input(s): AFPTM, CEA, CA199, CHROMGRNA in the last 8760 hours.  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   She has recovered fully and seems satisfied.    I did let her know that she would be resuming follow up with Dr. Serafina Royals, and that there would likely be follow up imaging to initiate a surveillance interval with cross-sectional imaging.  That may be in 3-4 months when he would like.    Until then, I encouraged her to follow up with her other appointments, including Urology as scheduled.    Plan: - Future follow up with Dr. Serafina Royals with any needed cross-sectional imaging, likely in 3-4 months.      Electronically Signed: Corrie Mckusick 01/06/2021, 12:00 PM   I spent a total of    15 Minutes in remote  clinical consultation, greater than 50% of which was counseling/coordinating care for SP image guided microwave ablation of left RCC. [name]    Visit type: Audio only (telephone). Audio (no video) only due to patient's lack of internet/smartphone capability. Alternative for in-person consultation at [facility], [address], [place], [place]. This visit type was conducted due to national recommendations for restrictions regarding the COVID-19 Pandemic (e.g. social distancing).  This format is felt to be most appropriate for this patient at this time.  All issues noted in this document were discussed and addressed.

## 2021-01-11 ENCOUNTER — Other Ambulatory Visit: Payer: Medicare Other

## 2021-01-27 ENCOUNTER — Other Ambulatory Visit: Payer: Self-pay

## 2021-01-27 ENCOUNTER — Ambulatory Visit (HOSPITAL_COMMUNITY)
Admission: RE | Admit: 2021-01-27 | Discharge: 2021-01-27 | Disposition: A | Discharge status: Home / Self Care | Payer: Medicare Other | Source: Ambulatory Visit | Attending: Nurse Practitioner | Admitting: Nurse Practitioner

## 2021-01-27 VITALS — BP 148/88 | HR 108 | Ht 60.0 in | Wt 263.6 lb

## 2021-01-27 DIAGNOSIS — R600 Localized edema: Secondary | ICD-10-CM | POA: Diagnosis not present

## 2021-01-27 DIAGNOSIS — I10 Essential (primary) hypertension: Secondary | ICD-10-CM | POA: Diagnosis not present

## 2021-01-27 DIAGNOSIS — I252 Old myocardial infarction: Secondary | ICD-10-CM | POA: Insufficient documentation

## 2021-01-27 DIAGNOSIS — I2510 Atherosclerotic heart disease of native coronary artery without angina pectoris: Secondary | ICD-10-CM | POA: Diagnosis not present

## 2021-01-27 DIAGNOSIS — Z7901 Long term (current) use of anticoagulants: Secondary | ICD-10-CM | POA: Insufficient documentation

## 2021-01-27 DIAGNOSIS — D6869 Other thrombophilia: Secondary | ICD-10-CM | POA: Diagnosis not present

## 2021-01-27 DIAGNOSIS — I4819 Other persistent atrial fibrillation: Secondary | ICD-10-CM | POA: Insufficient documentation

## 2021-01-27 DIAGNOSIS — I350 Nonrheumatic aortic (valve) stenosis: Secondary | ICD-10-CM | POA: Diagnosis not present

## 2021-01-27 NOTE — Progress Notes (Addendum)
Primary Care Physician: Glenis Smoker, MD Referring Physician:Acharya,Gayatri , MD   [name] [name] is a 80 y.o. female with a h/o HTN, DM, that presented tp [facility] ED with chest pain and found to have new onset afib. She was admitted and had an echo that was unremarkable and a Left heart cath that showed mimimal nonobstructive CAD. She was discharfed in afib on diltiazem 120 mg qd and eliquis 5 mg bid for a CHA2DS2VASc score of 5.  In the afib clinic she remains in afib with RVR. Overall she feels ok.No  further chest pain. She does not drink alcohol, drinks several diet Pepsi's a day, no tobacco and denies snoring.  She does have intermittent LLE, Rt > Lt and was d/c on lasix as needed.   F/u in afib clinic 7/7. She is now approaching the full 3 week requirement of DOAC for  cardioversion. She  has chronic lower extremity edema which preceded afib but has required more lasix since in afib. She is rate controlled.   F/u in afib clinic 7/23. Unfortunately, DCCV was cancelled as pt started having hematuria the day before so it was cancelled until it could be determined source of bleeding as her U/A did not show any infection. She took an round of antibiotics and has not seen any more bleeding. DCCV will be rescheduled.She continues in rate controlled afib.   F/u in afib clinic, 09/14/20. It was discovered that hematuria was coming from  a kidney stone that was surgically removed 9/4. The hematuria stopped about a week after that. She is now back in  the afib clinic to have the cardioversion rescheduled. She had a delayed eliquis dose by 8 hours on 9/14 so will be scheduled 3 weeks after that date.   F/u in afib clinic, 10/01/20. Unfortunately, she failed cardioversion. Dr. Johney Frame( CV MD) discussed with her starting amiodarone and gave her an RX. However, pt got home and read  the SE and wanted to wait until today to further discuss. She is concerned because her daughter has long QT syndrome, had  cardiac arrest in the past and has an ICD. Other than that, she does not like the SE profile of drug. She has minimal CAD but I don't  have an EKG in SR to assess if she has any conduction issues in consideration for use of flecainide. Pt feels she has been in afib since spring of 2020, but was unaware. She does have fatigue and some mild LLE with afib. Since she has been in afib for a while I have my doubts that Multaq would be sufficient to restore and hold SR. She is not excited re hospital stay for Tikosyn and we again have the family history of long qt. Her qt in afib is around 480 ms which is not ideal for tikosyn.   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 Therefore, it would be best to have her renal issues resolved first so we do not to have to worry about stopping blood thinners if other options to restore SR are pursued. Due to the issues stated above, she may be a front line ablation candidate.   F/u in afib clinic, 01/27/21. 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 Since her surgery, she has been very sedentary and has noticed decline in exertional endurance and more exertional dyspnea. Her weight is up from 241.78 lbs prior to surgery to 263.6 lbs after surgery. I think most of this is true weight, but her legs, which have been swollen since spring of 2020( ? onset of afib, not found  until 2021) have also shown more swelling with some weeping recently. Her HR here is slightly over 100, at home v rates are controlled, mostly less than 100 bpm. I conferred with Dr. Quentin Ore prior to her surgery and he said that he would be glad to discuss with her options to restore SR after her surgery. I do not see any clear cut path with AAD's with a qtc of 495 and as described above, daughter  with long qt syndrome, cardiac arrest and ICD.   Today, she denies symptoms of palpitations, chest pain, shortness of breath, orthopnea, PND, lower extremity edema, dizziness, presyncope, syncope, or neurologic sequela. The patient is tolerating medications without difficulties and is otherwise without complaint today.   Past Medical History:  Diagnosis Date  . Anemia    hx of  . Arthritis    knee and shoulders  . Atrial fibrillation ([place])   . Carpal tunnel syndrome on right   . Coronary artery disease 05/2020   Minimal, nobstructive CAD. Tortuous cororany arteries.  . Diabetes mellitus without complication (HCC)    diet controlled no meds in 5-6 yrs  . Dyspnea    on exertion   . Dysrhythmia 05/2020   afib  . GERD (gastroesophageal reflux disease)    No longer taking medications  . Hematuria   . History of hypothyroidism 30 yrs ago  . History of kidney stones   . Hypertension   . Lower extremity edema    chronic lower extremity edema   . Myocardial infarction [facility])    patient mention questionable MI  . Renal mass    Past Surgical History:  Procedure Laterality Date  . ABDOMINAL HYSTERECTOMY  1992   complete  . BILATERAL CARPAL TUNNEL RELEASE  yrs ago  . CARDIOVERSION N/A 09/23/2020   Procedure: CARDIOVERSION;  Surgeon: Freada Bergeron, MD;  Location: [facility] ENDOSCOPY;  Service: Cardiovascular;  Laterality: N/A;  . CHOLECYSTECTOMY  1996  . CYSTOSCOPY/URETEROSCOPY/HOLMIUM LASER/STENT PLACEMENT Bilateral 08/19/2020   Procedure: CYSTOSCOPY BILATERAL  RETROGRADE  URETEROSCOPY/HOLMIUM LASER/STENT PLACEMENT;  Surgeon: Irine Seal, MD;  Location: [facility];  Service: Urology;  Laterality: Bilateral;  . HAMMER TOE SURGERY Right yrs ago  . IR RADIOLOGIST EVAL & MGMT  10/26/2020  . IR RADIOLOGIST EVAL & MGMT  01/06/2021  . LEFT HEART CATH AND CORONARY ANGIOGRAPHY N/A 06/02/2020   Procedure: LEFT HEART CATH AND CORONARY ANGIOGRAPHY;  Surgeon: Jettie Booze, MD;   Location: [facility];  Service: Cardiovascular;  Laterality: N/A;  . RADIOLOGY WITH ANESTHESIA N/A 11/17/2020   Procedure: RADIOLOGY WITH ANESTHESIA RENAL MICROWAVE ABLATION;  Surgeon: Suzette Battiest, MD;  Location: [facility] ORS;  Service: Radiology;  Laterality: N/A;  . TONSILLECTOMY  age 64    Current Outpatient Medications  Medication Sig Dispense Refill  . acetaminophen (TYLENOL) 500 MG tablet Take 500-1,000 mg by mouth every 6 (six) hours as needed for moderate pain or headache.     [name] apixaban (ELIQUIS) 5 MG TABS tablet Take 1 tablet (5 mg total) by mouth 2 (two) times daily. 60 tablet 6  . Calcium Carb-Cholecalciferol (CALCIUM 600+D) 600-800 MG-UNIT TABS Take  1 tablet by mouth every evening.     . furosemide (LASIX) 20 MG tablet Take one tablet by mouth daily (Patient taking differently: Take 20 mg by mouth daily.) 30 tablet 6  . hydrocortisone cream 0.5 % Apply topically 3 (three) times daily. 30 g 0  . Hydrocortisone-Aloe Vera (WCHENIDPO-24/MPNT EX) Apply 1 application topically in the morning and at bedtime.    . Liniments (BLUE-EMU SUPER STRENGTH) CREA Apply 1 application topically 2 (two) times daily as needed (pain.).     [name] loperamide (IMODIUM A-D) 2 MG tablet Take 2-4 mg by mouth as needed for diarrhea or loose stools.     . metoprolol tartrate (LOPRESSOR) 25 MG tablet Take 1 tablet (25 mg total) by mouth 2 (two) times daily. 60 tablet 6  . Multiple Vitamin (MULTIVITAMIN WITH MINERALS) TABS tablet Take 1 tablet by mouth daily.    . Multiple Vitamins-Minerals (PRESERVISION AREDS 2 PO) Take 1 capsule by mouth 2 (two) times daily.     [name] nystatin (MYCOSTATIN/NYSTOP) powder Apply 1 application topically 3 (three) times daily. To rash in groin (Patient taking differently: Apply 1 application topically 2 (two) times daily as needed (rash/irritation.).) 30 g 3  . Oyster Shell Calcium 500 MG TABS 1 tablet with meals    . Potassium Chloride ER 20 MEQ TBCR Take one tablet by mouth daily  (Patient taking differently: Take 20 mEq by mouth daily.) 30 tablet 6   No current facility-administered medications for this encounter.    Allergies  Allergen Reactions  . Tetanus-Diphtheria Toxoids Td Other (See Comments)    Felt very sick  . Cardizem [Diltiazem] Rash    Social History   Socioeconomic History  . Marital status: Married    Spouse name: Not on file  . Number of children: Not on file  . Years of education: Not on file  . Highest education level: Not on file  Occupational History  . Not on file  Tobacco Use  . Smoking status: Never Smoker  . Smokeless tobacco: Never Used  Vaping Use  . Vaping Use: Never used  Substance and Sexual Activity  . Alcohol use: Not Currently  . Drug use: Never  . Sexual activity: Not Currently    Birth control/protection: Surgical  Other Topics Concern  . Not on file  Social History Narrative  . Not on file   Social Determinants of Health   Financial Resource Strain: Not on file  Food Insecurity: Not on file  Transportation Needs: Not on file  Physical Activity: Not on file  Stress: Not on file  Social Connections: Not on file  Intimate Partner Violence: Not on file    Family History  Problem Relation Age of Onset  . Hypertension Mother   . Hypertension Father   . Diabetes Paternal Grandmother     ROS- All systems are reviewed and negative except as per the HPI above  Physical Exam: Vitals:   01/27/21 1325  BP: (!) 148/88  Pulse: (!) 108  Weight: 119.6 kg  Height: 5' (1.524 m)   Wt Readings from Last 3 Encounters:  01/27/21 119.6 kg  11/17/20 114.7 kg  09/30/20 109.9 kg    Labs: Lab Results  Component Value Date   NA 142 11/17/2020   K 3.7 11/17/2020   CL 111 11/17/2020   CO2 22 11/17/2020   GLUCOSE 167 (H) 11/17/2020   BUN 24 (H) 11/17/2020   CREATININE 0.80 11/17/2020   CALCIUM 9.0 11/17/2020   PHOS 2.9 06/02/2020  MG 2.2 06/04/2020   Lab Results  Component Value Date   INR 1.2  11/17/2020   Lab Results  Component Value Date   CHOL 184 06/01/2020   HDL 51 06/01/2020   LDLCALC 120 (H) 06/01/2020   TRIG 67 06/01/2020     GEN- The patient is well appearing, alert and oriented x 3 today.   Head- normocephalic, atraumatic Eyes-  Sclera clear, conjunctiva pink Ears- hearing intact Oropharynx- clear Neck- supple, no JVP Lymph- no cervical lymphadenopathy Lungs- Clear to ausculation bilaterally, normal work of breathing Heart- irregular rate and rhythm, no murmurs, rubs or gallops, PMI not laterally displaced GI- soft, NT, ND, + BS Extremities- no clubbing, cyanosis, 2+ edema with statis dermatitis, left leg weeping,+ bandages present  MS- no significant deformity or atrophy Skin- no rash or lesion Psych- euthymic mood, full affect Neuro- strength and sensation are intact  EKG-afib at 108  mpm,  qrs int 94 ms, qtc 479   bms   LHC- The left ventricular systolic function is normal.  LV end diastolic pressure is normal.  The left ventricular ejection fraction is 50-55% by visual estimate.  There is no aortic valve stenosis.  Minimal, nobstructive CAD. Tortuous cororany arteries.   Echo-1. Left ventricular ejection fraction, by estimation, is 55 to 60%. The  left ventricle has normal function. The left ventricle has no regional  wall motion abnormalities. There is mild left ventricular hypertrophy.  Left ventricular diastolic parameters  are indeterminate.  2. Right ventricular systolic function is mildly reduced. The right  ventricular size is normal. There is normal pulmonary artery systolic  pressure. The estimated right ventricular systolic pressure is 40.1 mmHg.  3. Left atrial size was mildly dilated.  4. The mitral valve is abnormal. Trivial mitral valve regurgitation. No  evidence of mitral stenosis.  5. The aortic valve is abnormal. Aortic valve regurgitation is trivial.  Moderate aortic valve stenosis. Aortic valve mean gradient measures  20.0  mmHg.  6. The inferior vena cava is normal in size with greater than 50%  respiratory variability, suggesting right atrial pressure of 3 mmHg.   Assessment and Plan: 1. 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 CHA2DS2VASc score of  at least 5  Continue eliquis 5 mg bid   3. Renal growth  s/p surgery  Appointment request sent to Dr. Mardene Speak office     Geroge Baseman. Rekisha Welling, [facility] [place], [place]  [id number] [phone]   

## 2021-01-27 NOTE — Patient Instructions (Addendum)
For the next 3 days increase lasix to 20mg  twice a day and potassium 10meq twice a day -- call on Monday with update of weight and BP/heart rate

## 2021-01-31 ENCOUNTER — Telehealth (HOSPITAL_COMMUNITY): Payer: Self-pay | Admitting: *Deleted

## 2021-01-31 MED ORDER — METOPROLOL TARTRATE 25 MG PO TABS: 37.5000 mg | ORAL_TABLET | Freq: Two times a day (BID) | ORAL | 6 refills | Status: DC

## 2021-01-31 NOTE — Telephone Encounter (Signed)
Patient vitals/weight since Friday Friday - weight 262 HR103-110 Saturday - weight 260 HR 87-130 Sunday - weight 258 HR 110-120 Monday - weight 255.4 HR 103-120  Discussed with Roderic Palau NP can reduce lasix back to normal dosing. Increase metoprolol to 37.5mg  twice a day. Friday for BP/weight check

## 2021-02-04 ENCOUNTER — Ambulatory Visit (HOSPITAL_COMMUNITY)
Admission: RE | Admit: 2021-02-04 | Discharge: 2021-02-04 | Disposition: A | Discharge status: Home / Self Care | Payer: Medicare Other | Source: Ambulatory Visit | Attending: Physician Assistant | Admitting: Physician Assistant

## 2021-02-04 ENCOUNTER — Other Ambulatory Visit: Payer: Self-pay

## 2021-02-04 VITALS — BP 122/82 | Wt 264.8 lb

## 2021-02-04 DIAGNOSIS — I4819 Other persistent atrial fibrillation: Secondary | ICD-10-CM | POA: Insufficient documentation

## 2021-02-04 LAB — BASIC METABOLIC PANEL
Anion gap: 13 (ref 5–15)
BUN: 23 mg/dL (ref 8–23)
CO2: 20 mmol/L — ABNORMAL LOW (ref 22–32)
Calcium: 9.3 mg/dL (ref 8.9–10.3)
Chloride: 103 mmol/L (ref 98–111)
Creatinine, Ser: 1.2 mg/dL — ABNORMAL HIGH (ref 0.44–1.00)
GFR, Estimated: 47 mL/min — ABNORMAL LOW (ref >60)
Glucose, Bld: 125 mg/dL — ABNORMAL HIGH (ref 70–99)
Potassium: 4.5 mmol/L (ref 3.5–5.1)
Sodium: 136 mmol/L (ref 135–145)

## 2021-02-04 MED ORDER — POTASSIUM CHLORIDE ER 20 MEQ PO TBCR
40.0000 meq | EXTENDED_RELEASE_TABLET | Freq: Every day | ORAL | 3 refills | Status: DC
Start: 2021-02-04 — End: 2021-06-21

## 2021-02-04 MED ORDER — FUROSEMIDE 40 MG PO TABS
40.0000 mg | ORAL_TABLET | Freq: Every day | ORAL | 3 refills | Status: DC
Start: 2021-02-04 — End: 2021-02-22

## 2021-02-04 MED ORDER — METOPROLOL TARTRATE 25 MG PO TABS
37.5000 mg | ORAL_TABLET | Freq: Two times a day (BID) | ORAL | 3 refills | Status: DC
Start: 2021-02-04 — End: 2021-05-30

## 2021-02-04 NOTE — Addendum Note (Signed)
Encounter addended by: Juluis Mire, RN on: 02/04/2021 4:07 PM  Actions taken: Order list changed

## 2021-02-04 NOTE — Progress Notes (Signed)
Patient in for weight check and labs today.  HR 94 BP 122/82 Weight 264lbs today. Pt weight on Monday was 252lbs.. Lower extremity edema present. Will await lab result to determine lasix dosing. Will continue on metoprolol 37.5mg  BID as heart rates are better controlled. Pt has follow up with Dr. Quentin Ore in early March.

## 2021-02-22 ENCOUNTER — Ambulatory Visit: Payer: Medicare Other | Admitting: Cardiology

## 2021-02-22 ENCOUNTER — Other Ambulatory Visit: Payer: Self-pay

## 2021-02-22 ENCOUNTER — Encounter: Payer: Self-pay | Admitting: Cardiology

## 2021-02-22 VITALS — BP 94/60 | HR 108 | Ht 60.0 in | Wt 258.0 lb

## 2021-02-22 DIAGNOSIS — I4819 Other persistent atrial fibrillation: Secondary | ICD-10-CM | POA: Diagnosis not present

## 2021-02-22 LAB — COMPREHENSIVE METABOLIC PANEL
ALT: 21 IU/L (ref 0–32)
AST: 28 IU/L (ref 0–40)
Albumin/Globulin Ratio: 1.3 (ref 1.2–2.2)
Albumin: 3.7 g/dL (ref 3.7–4.7)
Alkaline Phosphatase: 163 IU/L — ABNORMAL HIGH (ref 44–121)
BUN/Creatinine Ratio: 19 (ref 12–28)
BUN: 20 mg/dL (ref 8–27)
Bilirubin Total: 1.6 mg/dL — ABNORMAL HIGH (ref 0.0–1.2)
CO2: 22 mmol/L (ref 20–29)
Calcium: 9.1 mg/dL (ref 8.7–10.3)
Chloride: 102 mmol/L (ref 96–106)
Creatinine, Ser: 1.05 mg/dL — ABNORMAL HIGH (ref 0.57–1.00)
Globulin, Total: 2.8 g/dL (ref 1.5–4.5)
Glucose: 158 mg/dL — ABNORMAL HIGH (ref 65–99)
Potassium: 4.5 mmol/L (ref 3.5–5.2)
Sodium: 139 mmol/L (ref 134–144)
Total Protein: 6.5 g/dL (ref 6.0–8.5)
eGFR: 55 mL/min/{1.73_m2} — ABNORMAL LOW (ref >59)

## 2021-02-22 LAB — T4, FREE: Free T4: 1.23 ng/dL (ref 0.82–1.77)

## 2021-02-22 LAB — TSH: TSH: 3.16 u[IU]/mL (ref 0.450–4.500)

## 2021-02-22 MED ORDER — TORSEMIDE 20 MG PO TABS: ORAL_TABLET | ORAL | 3 refills | Status: DC

## 2021-02-22 MED ORDER — AMIODARONE HCL 200 MG PO TABS: ORAL_TABLET | ORAL | 3 refills | Status: DC

## 2021-02-22 NOTE — Patient Instructions (Addendum)
Medication Instructions:  Your physician has recommended you make the following change in your medication:   1. START taking amiodarone 200 mg--  A.  FIRST 5 days- Take 2 tablets (400 mg) by mouth  TWICE a day  B.  THEN for next 5 days- Take 2 tablets (400 mg) by mouth ONCE a day  C.  AFTER 10 days- Take 1 tablet (200 mg) by mouth ONCE a day  2.  STOP furosemide (lasix)  3.  START take torsemide 20 mg--  A.   FIRST 7 days-- TAKE torsemide 20 mg one tablet by mouth TWICE a day  B.  AFTER 7 days- Take torsemide 20 mg one tablet by mouth daily  Labwork: You will get lab work today:  CMP, TSH and free T4  Testing/Procedures: None ordered.  Follow-Up: Your physician wants you to follow-up in: 2 weeks with Roderic Palau, NP at the [place] clinic for follow up and schedule cardioversion.  Any Other Special Instructions Will Be Listed Below (If Applicable).  You will be scheduled for a cardioversion at your follow up with the afib clinic (6 weeks after initiation of amiodarone)  If you need a refill on your cardiac medications before your next appointment, please call your pharmacy.

## 2021-02-22 NOTE — Progress Notes (Signed)
Electrophysiology Office Note:    Date:  02/22/2021   ID:  [name] [name], DOB 10-03-1941, MRN [id number]  PCP:  Glenis Smoker, MD  [facility] Cardiologist:  Elouise Munroe, MD  [place] Electrophysiologist:  None   Referring MD: Sherran Needs, NP   Chief Complaint: Atrial fibrillation  History of Present Illness:    [name] [name] is a 80 y.o. female who presents for an evaluation of atrial fibrillation at the request of Roderic Palau, NP. Their medical history includes hypertension, diabetes.  CHA2DS2-VASc score of 5.  She recently saw Roderic Palau on January 27, 2021.  At that visit she was still in atrial fibrillation with RVR. The patient was seen in July 2021.  At that appointment, the patient reported hematuria so she stopped her blood thinner and a cardioversion was canceled.  Further work-up of her hematuria revealed that she had a kidney stone that was surgically removed on August 21, 2020.  She then followed up on October 2021 after a failed cardioversion.  A prescription for amiodarone was written for the patient but the patient decided not to take this because of possible side effects.  While in atrial fibrillation, she has fatigue and lower extremity edema.  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  Today she tells me that she is having trouble with lower extremity edema.  She also has some open wounds on both lower extremities that are in various stages of healing.  She tells me that her weight is up and she is concerned that this is mostly fluid retention.  Past Medical History:  Diagnosis Date  . Anemia    hx of  . Arthritis    knee and shoulders  . Atrial fibrillation ([place])   . Carpal tunnel syndrome on right   . Coronary artery disease 05/2020   Minimal, nobstructive CAD. Tortuous cororany arteries.  . Diabetes mellitus without complication (HCC)    diet controlled no meds in 5-6 yrs  . Dyspnea    on exertion   .  Dysrhythmia 05/2020   afib  . GERD (gastroesophageal reflux disease)    No longer taking medications  . Hematuria   . History of hypothyroidism 30 yrs ago  . History of kidney stones   . Hypertension   . Lower extremity edema    chronic lower extremity edema   . Myocardial infarction [facility])    patient mention questionable MI  . Renal mass     Past Surgical History:  Procedure Laterality Date  . ABDOMINAL HYSTERECTOMY  1992   complete  . BILATERAL CARPAL TUNNEL RELEASE  yrs ago  . CARDIOVERSION N/A 09/23/2020   Procedure: CARDIOVERSION;  Surgeon: Freada Bergeron, MD;  Location: [facility] ENDOSCOPY;  Service: Cardiovascular;  Laterality: N/A;  . CHOLECYSTECTOMY  1996  . CYSTOSCOPY/URETEROSCOPY/HOLMIUM LASER/STENT PLACEMENT Bilateral 08/19/2020   Procedure: CYSTOSCOPY BILATERAL  RETROGRADE  URETEROSCOPY/HOLMIUM LASER/STENT PLACEMENT;  Surgeon: Irine Seal, MD;  Location: [facility];  Service: Urology;  Laterality: Bilateral;  . HAMMER TOE SURGERY Right yrs ago  . IR RADIOLOGIST EVAL & MGMT  10/26/2020  . IR RADIOLOGIST EVAL & MGMT  01/06/2021  . LEFT HEART CATH AND CORONARY ANGIOGRAPHY N/A 06/02/2020   Procedure: LEFT HEART CATH AND CORONARY ANGIOGRAPHY;  Surgeon: Jettie Booze, MD;  Location: [facility] CV LAB;  Service: Cardiovascular;  Laterality: N/A;  . RADIOLOGY WITH ANESTHESIA N/A 11/17/2020   Procedure: RADIOLOGY WITH ANESTHESIA  RENAL MICROWAVE ABLATION;  Surgeon: Suzette Battiest, MD;  Location: [facility] ORS;  Service: Radiology;  Laterality: N/A;  . TONSILLECTOMY  age 89    Current Medications: Current Meds  Medication Sig  . acetaminophen (TYLENOL) 500 MG tablet Take 500-1,000 mg by mouth every 6 (six) hours as needed for moderate pain or headache.   [name] apixaban (ELIQUIS) 5 MG TABS tablet Take 1 tablet (5 mg total) by mouth 2 (two) times daily.  . Calcium Carb-Cholecalciferol (CALCIUM 600+D) 600-800 MG-UNIT TABS Take 1 tablet by mouth every evening.   .  furosemide (LASIX) 40 MG tablet Take 1 tablet (40 mg total) by mouth daily.  . hydrocortisone cream 0.5 % Apply topically 3 (three) times daily.  . Hydrocortisone-Aloe Vera (LFYBOFBPZ-02/HENI EX) Apply 1 application topically in the morning and at bedtime.  . Liniments (BLUE-EMU SUPER STRENGTH) CREA Apply 1 application topically 2 (two) times daily as needed (pain.).   [name] loperamide (IMODIUM A-D) 2 MG tablet Take 2-4 mg by mouth as needed for diarrhea or loose stools.   . metoprolol tartrate (LOPRESSOR) 25 MG tablet Take 1.5 tablets (37.5 mg total) by mouth 2 (two) times daily.  . Multiple Vitamin (MULTIVITAMIN WITH MINERALS) TABS tablet Take 1 tablet by mouth daily.  . Multiple Vitamins-Minerals (PRESERVISION AREDS 2 PO) Take 1 capsule by mouth 2 (two) times daily.   [name] nystatin (MYCOSTATIN/NYSTOP) powder Apply 1 application topically 3 (three) times daily. To rash in groin  . Oyster Shell Calcium 500 MG TABS 1 tablet with meals  . Potassium Chloride ER 20 MEQ TBCR Take 40 mEq by mouth daily.     Allergies:   Tetanus-diphtheria toxoids td and Cardizem [diltiazem]   Social History   Socioeconomic History  . Marital status: Married    Spouse name: Not on file  . Number of children: Not on file  . Years of education: Not on file  . Highest education level: Not on file  Occupational History  . Not on file  Tobacco Use  . Smoking status: Never Smoker  . Smokeless tobacco: Never Used  Vaping Use  . Vaping Use: Never used  Substance and Sexual Activity  . Alcohol use: Not Currently  . Drug use: Never  . Sexual activity: Not Currently    Birth control/protection: Surgical  Other Topics Concern  . Not on file  Social History Narrative  . Not on file   Social Determinants of Health   Financial Resource Strain: Not on file  Food Insecurity: Not on file  Transportation Needs: Not on file  Physical Activity: Not on file  Stress: Not on file  Social Connections: Not on file      Family History: [name] family history includes Diabetes in her paternal grandmother; Hypertension in her father and mother.  ROS:   Please see the history of present illness.    All other systems reviewed and are negative.  EKGs/Labs/Other Studies Reviewed:    The following studies were reviewed today:   June 03, 2020 echo personally reviewed Left ventricular function normal, 55 % Right ventricular function mildly reduced Mildly dilated left atrium Trivial MR   EKG:  The ekg ordered today demonstrates atrial fibrillation with a ventricular rate of 108 bpm.  Low voltage.  Recent Labs: 06/01/2020: B Natriuretic Peptide 298.0; TSH 1.720 06/04/2020: ALT 22; Magnesium 2.2 11/17/2020: Hemoglobin 11.3; Platelets 245 02/04/2021: BUN 23; Creatinine, Ser 1.20; Potassium 4.5; Sodium 136  Recent Lipid Panel    Component Value Date/Time  CHOL 184 06/01/2020 1704   TRIG 67 06/01/2020 1704   HDL 51 06/01/2020 1704   CHOLHDL 3.6 06/01/2020 1704   VLDL 13 06/01/2020 1704   LDLCALC 120 (H) 06/01/2020 1704    Physical Exam:    VS:  BP 94/60   Pulse (!) 108   Ht 5' (1.524 m)   Wt 258 lb (117 kg)   SpO2 95%   BMI 50.39 kg/m     Wt Readings from Last 3 Encounters:  02/22/21 258 lb (117 kg)  02/04/21 264 lb 12.8 oz (120.1 kg)  01/27/21 263 lb 9.6 oz (119.6 kg)     GEN:  Well nourished, well developed in no acute distress HEENT: Normal NECK: No JVD; No carotid bruits LYMPHATICS: No lymphadenopathy CARDIAC: Irregularly irregular, tachycardic,, no murmurs, rubs, gallops RESPIRATORY:  Clear to auscultation without rales, wheezing or rhonchi  ABDOMEN: Soft, non-tender, non-distended MUSCULOSKELETAL:  No edema; No deformity  SKIN: Warm and dry NEUROLOGIC:  Alert and oriented x 3 PSYCHIATRIC:  Normal affect   ASSESSMENT:    1. Persistent atrial fibrillation ([place])   2. Morbid obesity ([place])    PLAN:    In order of problems listed above:  1. Persistent atrial  fibrillation The patient clearly has symptomatic atrial fibrillation.  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  For her atrial fibrillation I think a rhythm control strategy is indicated given her diastolic heart failure while in atrial fibrillation.  We discussed the treatment options for rhythm control including antiarrhythmic medications and ablation.  I do not think she is currently a good candidate for ablation given her morbid obesity and decompensated diastolic heart failure.  We discussed her medical comorbidities and how they interact with antiarrhythmic medication selection.  I do think the best option for her right now is amiodarone.  We discussed the risks of amiodarone in detail during today's visit.  She would like to proceed with amiodarone loading.  I will plan to get some baseline blood work today and see her back in 3 months for repeat blood work and to assess her rhythm.  In the interim, she will be scheduled to see Roderic Palau in the A. fib clinic in approximately 2 weeks.  At that appointment a cardioversion should be scheduled if the patient's rhythm is still atrial fibrillation.  2.  Acute on chronic diastolic heart failure NYHA class III symptoms.  Warm and wet on exam today.  Amiodarone as above.  Cardiovert as above. Furosemide is clearly not adequate for the patient's degree of volume overload.  I have asked her to stop taking her Lasix and transition to torsemide.  I will start with torsemide 20 mg twice daily for 7 days and then she will decrease to torsemide 20 mg once daily.  She will continue taking her potassium supplementation.  Her diuretic regimen may need to be adjusted at her appointment with Roderic Palau in 2 weeks.  3.  Morbid obesity Her weight is likely due in part to excess fluid but there is  clearly an element of true weight gain.  Her BMI of 50 prevents safe ablation and limits the possible success of an ablation procedure.  I discussed this in detail with the patient and her husband her today's visit.  She will continue to work on weight loss.  If we  can get her weight back down to the 230lb range, could consider an invasive strategy to manage her atrial fibrillation which would allow [place] to avoid long-term amiodarone use.  4.  Hematuria Patient has had trouble with hematuria related to a kidney tumor and kidney stones.  She is currently taking her Eliquis uninterrupted.  I do think a watchman could be used to help her avoid long-term anticoagulation while still offering protection against stroke.  We discussed this briefly during today's visit.  Before proceeding with any left atrial appendage occlusion procedure her heart failure would have to be optimally controlled and her weight would have to to be significantly decreased.   Medication Adjustments/Labs and Tests Ordered: Current medicines are reviewed at length with the patient today.  Concerns regarding medicines are outlined above.  No orders of the defined types were placed in this encounter.  No orders of the defined types were placed in this encounter.    Signed, Lars Mage, MD, [facility]  02/22/2021 10:26 AM    Electrophysiology [facility]

## 2021-03-08 ENCOUNTER — Other Ambulatory Visit: Payer: Self-pay | Admitting: Interventional Radiology

## 2021-03-08 DIAGNOSIS — N2889 Other specified disorders of kidney and ureter: Secondary | ICD-10-CM

## 2021-03-10 ENCOUNTER — Encounter (HOSPITAL_COMMUNITY): Payer: Self-pay | Admitting: Nurse Practitioner

## 2021-03-10 ENCOUNTER — Ambulatory Visit (HOSPITAL_COMMUNITY)
Admission: RE | Admit: 2021-03-10 | Discharge: 2021-03-10 | Disposition: A | Discharge status: Home / Self Care | Payer: Medicare Other | Source: Ambulatory Visit | Attending: Nurse Practitioner | Admitting: Nurse Practitioner

## 2021-03-10 ENCOUNTER — Other Ambulatory Visit: Payer: Self-pay

## 2021-03-10 VITALS — BP 106/76 | HR 88 | Ht 60.0 in | Wt 246.6 lb

## 2021-03-10 DIAGNOSIS — R600 Localized edema: Secondary | ICD-10-CM | POA: Diagnosis not present

## 2021-03-10 DIAGNOSIS — I10 Essential (primary) hypertension: Secondary | ICD-10-CM | POA: Diagnosis not present

## 2021-03-10 DIAGNOSIS — Z8249 Family history of ischemic heart disease and other diseases of the circulatory system: Secondary | ICD-10-CM | POA: Diagnosis not present

## 2021-03-10 DIAGNOSIS — I2510 Atherosclerotic heart disease of native coronary artery without angina pectoris: Secondary | ICD-10-CM | POA: Insufficient documentation

## 2021-03-10 DIAGNOSIS — I4819 Other persistent atrial fibrillation: Secondary | ICD-10-CM | POA: Insufficient documentation

## 2021-03-10 DIAGNOSIS — Z833 Family history of diabetes mellitus: Secondary | ICD-10-CM | POA: Diagnosis not present

## 2021-03-10 DIAGNOSIS — Z7901 Long term (current) use of anticoagulants: Secondary | ICD-10-CM | POA: Diagnosis not present

## 2021-03-10 DIAGNOSIS — Z888 Allergy status to other drugs, medicaments and biological substances status: Secondary | ICD-10-CM | POA: Diagnosis not present

## 2021-03-10 DIAGNOSIS — Z87442 Personal history of urinary calculi: Secondary | ICD-10-CM | POA: Diagnosis not present

## 2021-03-10 DIAGNOSIS — Z79899 Other long term (current) drug therapy: Secondary | ICD-10-CM | POA: Diagnosis not present

## 2021-03-10 DIAGNOSIS — E119 Type 2 diabetes mellitus without complications: Secondary | ICD-10-CM | POA: Diagnosis not present

## 2021-03-10 DIAGNOSIS — D6869 Other thrombophilia: Secondary | ICD-10-CM

## 2021-03-10 DIAGNOSIS — Z887 Allergy status to serum and vaccine status: Secondary | ICD-10-CM | POA: Diagnosis not present

## 2021-03-10 LAB — COMPREHENSIVE METABOLIC PANEL
ALT: 25 U/L (ref 0–44)
AST: 29 U/L (ref 15–41)
Albumin: 3.2 g/dL — ABNORMAL LOW (ref 3.5–5.0)
Alkaline Phosphatase: 111 U/L (ref 38–126)
Anion gap: 10 (ref 5–15)
BUN: 17 mg/dL (ref 8–23)
CO2: 28 mmol/L (ref 22–32)
Calcium: 9 mg/dL (ref 8.9–10.3)
Chloride: 104 mmol/L (ref 98–111)
Creatinine, Ser: 1.14 mg/dL — ABNORMAL HIGH (ref 0.44–1.00)
GFR, Estimated: 50 mL/min — ABNORMAL LOW (ref >60)
Glucose, Bld: 137 mg/dL — ABNORMAL HIGH (ref 70–99)
Potassium: 4.1 mmol/L (ref 3.5–5.1)
Sodium: 142 mmol/L (ref 135–145)
Total Bilirubin: 1.1 mg/dL (ref 0.3–1.2)
Total Protein: 7 g/dL (ref 6.5–8.1)

## 2021-03-10 LAB — CBC
HCT: 41.1 % (ref 36.0–46.0)
Hemoglobin: 12.3 g/dL (ref 12.0–15.0)
MCH: 24.1 pg — ABNORMAL LOW (ref 26.0–34.0)
MCHC: 29.9 g/dL — ABNORMAL LOW (ref 30.0–36.0)
MCV: 80.6 fL (ref 80.0–100.0)
Platelets: 250 10*3/uL (ref 150–400)
RBC: 5.1 MIL/uL (ref 3.87–5.11)
RDW: 19 % — ABNORMAL HIGH (ref 11.5–15.5)
WBC: 7.5 10*3/uL (ref 4.0–10.5)
nRBC: 0 % (ref 0.0–0.2)

## 2021-03-10 MED ORDER — AMIODARONE HCL 200 MG PO TABS: 200.0000 mg | ORAL_TABLET | Freq: Every day | ORAL | 3 refills | Status: DC

## 2021-03-10 MED ORDER — TORSEMIDE 20 MG PO TABS
20.0000 mg | ORAL_TABLET | Freq: Two times a day (BID) | ORAL | 3 refills | Status: DC
Start: 2021-03-10 — End: 2021-08-10

## 2021-03-10 NOTE — H&P (View-Only) (Signed)
Primary Care Physician: Glenis Smoker, MD Referring Physician:Acharya,Gayatri , MD   [name] [name] is a 80 y.o. female with a h/o HTN, DM, that presented tp [facility] ED with chest pain and found to have new onset afib. She was admitted and had an echo that was unremarkable and a Left heart cath that showed mimimal nonobstructive CAD. She was discharfed in afib on diltiazem 120 mg qd and eliquis 5 mg bid for a CHA2DS2VASc score of 5.  In the afib clinic she remains in afib with RVR. Overall she feels ok.No  further chest pain. She does not drink alcohol, drinks several diet Pepsi's a day, no tobacco and denies snoring.  She does have intermittent LLE, Rt > Lt and was d/c on lasix as needed.   F/u in afib clinic 7/7. She is now approaching the full 3 week requirement of DOAC for  cardioversion. She  has chronic lower extremity edema which preceded afib but has required more lasix since in afib. She is rate controlled.   F/u in afib clinic 7/23. Unfortunately, DCCV was cancelled as pt started having hematuria the day before so it was cancelled until it could be determined source of bleeding as her U/A did not show any infection. She took an round of antibiotics and has not seen any more bleeding. DCCV will be rescheduled.She continues in rate controlled afib.   F/u in afib clinic, 09/14/20. It was discovered that hematuria was coming from  a kidney stone that was surgically removed 9/4. The hematuria stopped about a week after that. She is now back in  the afib clinic to have the cardioversion rescheduled. She had a delayed eliquis dose by 8 hours on 9/14 so will be scheduled 3 weeks after that date.   F/u in afib clinic, 10/01/20. Unfortunately, she failed cardioversion. Dr. Johney Frame( CV MD) discussed with her starting amiodarone and gave her an RX. However, pt got home and read  the SE and wanted to wait until today to further discuss. She is concerned because her daughter has long QT syndrome, had  cardiac arrest in the past and has an ICD. Other than that, she does not like the SE profile of drug. She has minimal CAD but I don't  have an EKG in SR to assess if she has any conduction issues in consideration for use of flecainide. Pt feels she has been in afib since spring of 2020, but was unaware. She does have fatigue and some mild LLE with afib. Since she has been in afib for a while I have my doubts that Multaq would be sufficient to restore and hold SR. She is not excited re hospital stay for Tikosyn and we again have the family history of long qt. Her qt in afib is around 480 ms which is not ideal for tikosyn.   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 Therefore, it would be best to have her renal issues resolved first so we do not to have to worry about stopping blood thinners if other options to restore SR are pursued. Due to the issues stated above, she may be a front line ablation candidate.   F/u in afib clinic, 01/27/21. 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 Since her surgery, she has been very sedentary and has noticed decline in exertional endurance and more exertional dyspnea. Her weight is up from 241.78 lbs prior to surgery to 263.6 lbs after surgery. I think most of this is true weight, but her legs, which have been swollen since spring of 2020( ? onset of afib, not found  until 2021) have also shown more swelling with some weeping recently. Her HR here is slightly over 100, at home v rates are controlled, mostly less than 100 bpm. I conferred with Dr. Quentin Ore prior to her surgery and he said that he would be glad to discuss with her options to restore SR after her surgery. I do not see any clear cut path with AAD's with a qtc of 495 and as described above, daughter  with long qt syndrome, cardiac arrest and ICD.   F/u in afib clinic, 03/10/21. Pt was seen by Dr. Quentin Ore and felt that pt's best option to restore SR  was to be loaded with Amiodarone. He also placed her on torsemide 20 mg bid x one week  for ongoing fluid overload/ worsening LLE . He anticipated CV in a few weeks which I will set up. She states that she lost 20 lbs of fluid  with the torsemide bid but since back to one a day for the last week, her shortness of breath has returned and  LLE has worsened that she now has 2 big water blisters on the left leg and the rt leg is weeping.  She is now down to 200 mg daily. She  did miss one dose of eliquis 3/8 so will need to wait until amio is loaded and on full anticoagulation after 3/30 to CV. She remains in afib at 88 bpm.   Today, she denies symptoms of palpitations, chest pain, shortness of breath, orthopnea, PND, lower extremity edema, dizziness, presyncope, syncope, or neurologic sequela. The patient is tolerating medications without difficulties and is otherwise without complaint today.   Past Medical History:  Diagnosis Date  . Anemia    hx of  . Arthritis    knee and shoulders  . Atrial fibrillation ([place])   . Carpal tunnel syndrome on right   . Coronary artery disease 05/2020   Minimal, nobstructive CAD. Tortuous cororany arteries.  . Diabetes mellitus without complication (HCC)    diet controlled no meds in 5-6 yrs  . Dyspnea    on exertion   . Dysrhythmia 05/2020   afib  . GERD (gastroesophageal reflux disease)    No longer taking medications  . Hematuria   . History of hypothyroidism 30 yrs ago  . History of kidney stones   . Hypertension   . Lower extremity edema    chronic lower extremity edema   . Myocardial infarction [facility])    patient mention questionable MI  . Renal mass    Past Surgical History:  Procedure Laterality Date  . ABDOMINAL HYSTERECTOMY  1992   complete  . BILATERAL CARPAL TUNNEL RELEASE  yrs ago  .  CARDIOVERSION N/A 09/23/2020   Procedure: CARDIOVERSION;  Surgeon: Freada Bergeron, MD;  Location: [facility] ENDOSCOPY;  Service: Cardiovascular;  Laterality: N/A;  . CHOLECYSTECTOMY  1996  . CYSTOSCOPY/URETEROSCOPY/HOLMIUM LASER/STENT PLACEMENT Bilateral 08/19/2020   Procedure: CYSTOSCOPY BILATERAL  RETROGRADE  URETEROSCOPY/HOLMIUM LASER/STENT PLACEMENT;  Surgeon: Irine Seal, MD;  Location: [facility];  Service: Urology;  Laterality: Bilateral;  . HAMMER TOE SURGERY Right yrs ago  . IR RADIOLOGIST EVAL &  MGMT  10/26/2020  . IR RADIOLOGIST EVAL & MGMT  01/06/2021  . LEFT HEART CATH AND CORONARY ANGIOGRAPHY N/A 06/02/2020   Procedure: LEFT HEART CATH AND CORONARY ANGIOGRAPHY;  Surgeon: Jettie Booze, MD;  Location: [place] [facility] LAB;  Service: Cardiovascular;  Laterality: N/A;  . RADIOLOGY WITH ANESTHESIA N/A 11/17/2020   Procedure: RADIOLOGY WITH ANESTHESIA RENAL MICROWAVE ABLATION;  Surgeon: Suzette Battiest, MD;  Location: [facility] ORS;  Service: Radiology;  Laterality: N/A;  . TONSILLECTOMY  age 38    Current Outpatient Medications  Medication Sig Dispense Refill  . acetaminophen (TYLENOL) 500 MG tablet Take 500-1,000 mg by mouth every 6 (six) hours as needed for moderate pain or headache.     [name] amiodarone (PACERONE) 200 MG tablet Take 2 tablets (400 mg total) by mouth 2 (two) times daily for 5 days, THEN 2 tablets (400 mg total) daily for 5 days, THEN 1 tablet (200 mg total) daily. 120 tablet 3  . apixaban (ELIQUIS) 5 MG TABS tablet Take 1 tablet (5 mg total) by mouth 2 (two) times daily. 60 tablet 6  . Calcium Carb-Cholecalciferol (CALCIUM 600+D) 600-800 MG-UNIT TABS Take 1 tablet by mouth every evening.     [name] Hydrocortisone-Aloe Vera (WSFKCLEXN-17/GYFV EX) Apply 1 application topically in the morning and at bedtime.    . Liniments (BLUE-EMU SUPER STRENGTH) CREA Apply 1 application topically 2 (two) times daily as needed (pain.).     [name] loperamide (IMODIUM A-D) 2 MG tablet Take 2-4  mg by mouth as needed for diarrhea or loose stools.     . metoprolol tartrate (LOPRESSOR) 25 MG tablet Take 1.5 tablets (37.5 mg total) by mouth 2 (two) times daily. 90 tablet 3  . Multiple Vitamin (MULTIVITAMIN WITH MINERALS) TABS tablet Take 1 tablet by mouth daily.    . Multiple Vitamins-Minerals (PRESERVISION AREDS 2 PO) Take 1 capsule by mouth 2 (two) times daily.     [place] Calcium 500 MG TABS 1 tablet with meals    . Potassium Chloride ER 20 MEQ TBCR Take 40 mEq by mouth daily. 60 tablet 3  . torsemide (DEMADEX) 20 MG tablet Take 1 tablet (20 mg total) by mouth 2 (two) times daily for 7 days, THEN 1 tablet (20 mg total) daily. 97 tablet 3   No current facility-administered medications for this encounter.    Allergies  Allergen Reactions  . Tetanus-Diphtheria Toxoids Td Other (See Comments)    Felt very sick  . Cardizem [Diltiazem] Rash    Social History   Socioeconomic History  . Marital status: Married    Spouse name: Not on file  . Number of children: Not on file  . Years of education: Not on file  . Highest education level: Not on file  Occupational History  . Not on file  Tobacco Use  . Smoking status: Never Smoker  . Smokeless tobacco: Never Used  Vaping Use  . Vaping Use: Never used  Substance and Sexual Activity  . Alcohol use: Not Currently  . Drug use: Never  . Sexual activity: Not Currently    Birth control/protection: Surgical  Other Topics Concern  . Not on file  Social History Narrative  . Not on file   Social Determinants of Health   Financial Resource Strain: Not on file  Food Insecurity: Not on file  Transportation Needs: Not on file  Physical Activity: Not on file  Stress: Not on file  Social Connections: Not on file  Intimate Partner Violence:  Not on file    Family History  Problem Relation Age of Onset  . Hypertension Mother   . Hypertension Father   . Diabetes Paternal Grandmother     ROS- All systems are reviewed and  negative except as per the HPI above  Physical Exam: There were no vitals filed for this visit. Wt Readings from Last 3 Encounters:  02/22/21 117 kg  02/04/21 120.1 kg  01/27/21 119.6 kg    Labs: Lab Results  Component Value Date   NA 139 02/22/2021   K 4.5 02/22/2021   CL 102 02/22/2021   CO2 22 02/22/2021   GLUCOSE 158 (H) 02/22/2021   BUN 20 02/22/2021   CREATININE 1.05 (H) 02/22/2021   CALCIUM 9.1 02/22/2021   PHOS 2.9 06/02/2020   MG 2.2 06/04/2020   Lab Results  Component Value Date   INR 1.2 11/17/2020   Lab Results  Component Value Date   CHOL 184 06/01/2020   HDL 51 06/01/2020   LDLCALC 120 (H) 06/01/2020   TRIG 67 06/01/2020     GEN- The patient is well appearing, alert and oriented x 3 today.   Head- normocephalic, atraumatic Eyes-  Sclera clear, conjunctiva pink Ears- hearing intact Oropharynx- clear Neck- supple, no JVP Lymph- no cervical lymphadenopathy Lungs- Clear to ausculation bilaterally, normal work of breathing Heart- irregular rate and rhythm, 2/6 systolic murmur, rubs or gallops, PMI not laterally displaced GI- soft, NT, ND, + BS Extremities- no clubbing, cyanosis, 2+ edema with statis dermatitis, rt leg weeping, 2 large water blisters left leg  MS- no significant deformity or atrophy Skin- no rash or lesion Psych- euthymic mood, full affect Neuro- strength and sensation are intact  EKG-afib at 108  mpm,  qrs int 94 ms, qtc 479   bms     LHC- 06/02/20-The left ventricular systolic function is normal.  LV end diastolic pressure is normal.  The left ventricular ejection fraction is 50-55% by visual estimate.  There is no aortic valve stenosis.  Minimal, nobstructive CAD. Tortuous cororany arteries.   Echo-1. Left ventricular ejection fraction, by estimation, is 55 to 60%. The  left ventricle has normal function. The left ventricle has no regional  wall motion abnormalities. There is mild left ventricular hypertrophy.  Left  ventricular diastolic parameters  are indeterminate.  2. Right ventricular systolic function is mildly reduced. The right  ventricular size is normal. There is normal pulmonary artery systolic  pressure. The estimated right ventricular systolic pressure is 82.9 mmHg.  3. Left atrial size was mildly dilated.  4. The mitral valve is abnormal. Trivial mitral valve regurgitation. No  evidence of mitral stenosis.  5. The aortic valve is abnormal. Aortic valve regurgitation is trivial.  Moderate aortic valve stenosis. Aortic valve mean gradient measures 20.0  mmHg.  6. The inferior vena cava is normal in size with greater than 50%  respiratory variability, suggesting right atrial pressure of 3 mmHg.   Assessment and Plan: 1. 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 Lambert,02/22/21,  amiodarone load started and lasix changed to torsemide 20 mg bid. While on this dose had less LLE and shortness of breath, 20 lb weight loss.  With reduction to 20 mg daily the dyspnea, LLE with water blisters and weeping has returned. Will increase back to torsemide 20 mg bid and refer to wound clinic to dress and manage LLE fluid.  Will schedule CV for 4/4  Cbc/cmet today   2. CHA2DS2VASc score of  at least 5  Continue eliquis 5 mg bid  No missed doses since 3/08  3. Renal growth  s/p surgery  4. Moderate AS By echo 05/2021 She will need to have this updated when she returns to SR   F/u one week after CV    Licet Dunphy C. Aadil Sur,  [facility] [address] [place],  [address] [phone]

## 2021-03-10 NOTE — Patient Instructions (Signed)
Cardioversion scheduled for Monday, April 4th  - Arrive at the [facility] and go to admitting at 1130AM  - Do not eat or drink anything after midnight the night prior to your procedure.  - Take all your morning medication (except diabetic medications) with a sip of water prior to arrival.  - You will not be able to drive home after your procedure.  - Do NOT miss any doses of your blood thinner - if you should miss a dose please notify our office immediately.  - If you feel as if you go back into normal rhythm prior to scheduled cardioversion, please notify our office immediately. If your procedure is canceled in the cardioversion suite you will be charged a cancellation fee.   Increase Toresmide to 20mg  twice a day  Amiodarone 200mg  once a day

## 2021-03-10 NOTE — Progress Notes (Signed)
Primary Care Physician: Glenis Smoker, MD Referring Physician:Acharya,Gayatri , MD   [name] [name] is a 80 y.o. female with a h/o HTN, DM, that presented tp [facility] ED with chest pain and found to have new onset afib. She was admitted and had an echo that was unremarkable and a Left heart cath that showed mimimal nonobstructive CAD. She was discharfed in afib on diltiazem 120 mg qd and eliquis 5 mg bid for a CHA2DS2VASc score of 5.  In the afib clinic she remains in afib with RVR. Overall she feels ok.No  further chest pain. She does not drink alcohol, drinks several diet Pepsi's a day, no tobacco and denies snoring.  She does have intermittent LLE, Rt > Lt and was d/c on lasix as needed.   F/u in afib clinic 7/7. She is now approaching the full 3 week requirement of DOAC for  cardioversion. She  has chronic lower extremity edema which preceded afib but has required more lasix since in afib. She is rate controlled.   F/u in afib clinic 7/23. Unfortunately, DCCV was cancelled as pt started having hematuria the day before so it was cancelled until it could be determined source of bleeding as her U/A did not show any infection. She took an round of antibiotics and has not seen any more bleeding. DCCV will be rescheduled.She continues in rate controlled afib.   F/u in afib clinic, 09/14/20. It was discovered that hematuria was coming from  a kidney stone that was surgically removed 9/4. The hematuria stopped about a week after that. She is now back in  the afib clinic to have the cardioversion rescheduled. She had a delayed eliquis dose by 8 hours on 9/14 so will be scheduled 3 weeks after that date.   F/u in afib clinic, 10/01/20. Unfortunately, she failed cardioversion. Dr. Johney Frame( CV MD) discussed with her starting amiodarone and gave her an RX. However, pt got home and read  the SE and wanted to wait until today to further discuss. She is concerned because her daughter has long QT syndrome, had  cardiac arrest in the past and has an ICD. Other than that, she does not like the SE profile of drug. She has minimal CAD but I don't  have an EKG in SR to assess if she has any conduction issues in consideration for use of flecainide. Pt feels she has been in afib since spring of 2020, but was unaware. She does have fatigue and some mild LLE with afib. Since she has been in afib for a while I have my doubts that Multaq would be sufficient to restore and hold SR. She is not excited re hospital stay for Tikosyn and we again have the family history of long qt. Her qt in afib is around 480 ms which is not ideal for tikosyn.   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 Therefore, it would be best to have her renal issues resolved first so we do not to have to worry about stopping blood thinners if other options to restore SR are pursued. Due to the issues stated above, she may be a front line ablation candidate.   F/u in afib clinic, 01/27/21. 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 Since her surgery, she has been very sedentary and has noticed decline in exertional endurance and more exertional dyspnea. Her weight is up from 241.78 lbs prior to surgery to 263.6 lbs after surgery. I think most of this is true weight, but her legs, which have been swollen since spring of 2020( ? onset of afib, not found  until 2021) have also shown more swelling with some weeping recently. Her HR here is slightly over 100, at home v rates are controlled, mostly less than 100 bpm. I conferred with Dr. Quentin Ore prior to her surgery and he said that he would be glad to discuss with her options to restore SR after her surgery. I do not see any clear cut path with AAD's with a qtc of 495 and as described above, daughter  with long qt syndrome, cardiac arrest and ICD.   F/u in afib clinic, 03/10/21. Pt was seen by Dr. Quentin Ore and felt that pt's best option to restore SR  was to be loaded with Amiodarone. He also placed her on torsemide 20 mg bid x one week  for ongoing fluid overload/ worsening LLE . He anticipated CV in a few weeks which I will set up. She states that she lost 20 lbs of fluid  with the torsemide bid but since back to one a day for the last week, her shortness of breath has returned and  LLE has worsened that she now has 2 big water blisters on the left leg and the rt leg is weeping.  She is now down to 200 mg daily. She  did miss one dose of eliquis 3/8 so will need to wait until amio is loaded and on full anticoagulation after 3/30 to CV. She remains in afib at 88 bpm.   Today, she denies symptoms of palpitations, chest pain, shortness of breath, orthopnea, PND, lower extremity edema, dizziness, presyncope, syncope, or neurologic sequela. The patient is tolerating medications without difficulties and is otherwise without complaint today.   Past Medical History:  Diagnosis Date  . Anemia    hx of  . Arthritis    knee and shoulders  . Atrial fibrillation ([place])   . Carpal tunnel syndrome on right   . Coronary artery disease 05/2020   Minimal, nobstructive CAD. Tortuous cororany arteries.  . Diabetes mellitus without complication (HCC)    diet controlled no meds in 5-6 yrs  . Dyspnea    on exertion   . Dysrhythmia 05/2020   afib  . GERD (gastroesophageal reflux disease)    No longer taking medications  . Hematuria   . History of hypothyroidism 30 yrs ago  . History of kidney stones   . Hypertension   . Lower extremity edema    chronic lower extremity edema   . Myocardial infarction [facility])    patient mention questionable MI  . Renal mass    Past Surgical History:  Procedure Laterality Date  . ABDOMINAL HYSTERECTOMY  1992   complete  . BILATERAL CARPAL TUNNEL RELEASE  yrs ago  .  CARDIOVERSION N/A 09/23/2020   Procedure: CARDIOVERSION;  Surgeon: Freada Bergeron, MD;  Location: [facility] ENDOSCOPY;  Service: Cardiovascular;  Laterality: N/A;  . CHOLECYSTECTOMY  1996  . CYSTOSCOPY/URETEROSCOPY/HOLMIUM LASER/STENT PLACEMENT Bilateral 08/19/2020   Procedure: CYSTOSCOPY BILATERAL  RETROGRADE  URETEROSCOPY/HOLMIUM LASER/STENT PLACEMENT;  Surgeon: Irine Seal, MD;  Location: [facility];  Service: Urology;  Laterality: Bilateral;  . HAMMER TOE SURGERY Right yrs ago  . IR RADIOLOGIST EVAL &  MGMT  10/26/2020  . IR RADIOLOGIST EVAL & MGMT  01/06/2021  . LEFT HEART CATH AND CORONARY ANGIOGRAPHY N/A 06/02/2020   Procedure: LEFT HEART CATH AND CORONARY ANGIOGRAPHY;  Surgeon: Jettie Booze, MD;  Location: [place] CV LAB;  Service: Cardiovascular;  Laterality: N/A;  . RADIOLOGY WITH ANESTHESIA N/A 11/17/2020   Procedure: RADIOLOGY WITH ANESTHESIA RENAL MICROWAVE ABLATION;  Surgeon: Suzette Battiest, MD;  Location: [facility] ORS;  Service: Radiology;  Laterality: N/A;  . TONSILLECTOMY  age 84    Current Outpatient Medications  Medication Sig Dispense Refill  . acetaminophen (TYLENOL) 500 MG tablet Take 500-1,000 mg by mouth every 6 (six) hours as needed for moderate pain or headache.     [name] amiodarone (PACERONE) 200 MG tablet Take 2 tablets (400 mg total) by mouth 2 (two) times daily for 5 days, THEN 2 tablets (400 mg total) daily for 5 days, THEN 1 tablet (200 mg total) daily. 120 tablet 3  . apixaban (ELIQUIS) 5 MG TABS tablet Take 1 tablet (5 mg total) by mouth 2 (two) times daily. 60 tablet 6  . Calcium Carb-Cholecalciferol (CALCIUM 600+D) 600-800 MG-UNIT TABS Take 1 tablet by mouth every evening.     [name] Hydrocortisone-Aloe Vera (QBHALPFXT-02/IOXB EX) Apply 1 application topically in the morning and at bedtime.    . Liniments (BLUE-EMU SUPER STRENGTH) CREA Apply 1 application topically 2 (two) times daily as needed (pain.).     [name] loperamide (IMODIUM A-D) 2 MG tablet Take 2-4  mg by mouth as needed for diarrhea or loose stools.     . metoprolol tartrate (LOPRESSOR) 25 MG tablet Take 1.5 tablets (37.5 mg total) by mouth 2 (two) times daily. 90 tablet 3  . Multiple Vitamin (MULTIVITAMIN WITH MINERALS) TABS tablet Take 1 tablet by mouth daily.    . Multiple Vitamins-Minerals (PRESERVISION AREDS 2 PO) Take 1 capsule by mouth 2 (two) times daily.     [place] Calcium 500 MG TABS 1 tablet with meals    . Potassium Chloride ER 20 MEQ TBCR Take 40 mEq by mouth daily. 60 tablet 3  . torsemide (DEMADEX) 20 MG tablet Take 1 tablet (20 mg total) by mouth 2 (two) times daily for 7 days, THEN 1 tablet (20 mg total) daily. 97 tablet 3   No current facility-administered medications for this encounter.    Allergies  Allergen Reactions  . Tetanus-Diphtheria Toxoids Td Other (See Comments)    Felt very sick  . Cardizem [Diltiazem] Rash    Social History   Socioeconomic History  . Marital status: Married    Spouse name: Not on file  . Number of children: Not on file  . Years of education: Not on file  . Highest education level: Not on file  Occupational History  . Not on file  Tobacco Use  . Smoking status: Never Smoker  . Smokeless tobacco: Never Used  Vaping Use  . Vaping Use: Never used  Substance and Sexual Activity  . Alcohol use: Not Currently  . Drug use: Never  . Sexual activity: Not Currently    Birth control/protection: Surgical  Other Topics Concern  . Not on file  Social History Narrative  . Not on file   Social Determinants of Health   Financial Resource Strain: Not on file  Food Insecurity: Not on file  Transportation Needs: Not on file  Physical Activity: Not on file  Stress: Not on file  Social Connections: Not on file  Intimate Partner Violence:  Not on file    Family History  Problem Relation Age of Onset  . Hypertension Mother   . Hypertension Father   . Diabetes Paternal Grandmother     ROS- All systems are reviewed and  negative except as per the HPI above  Physical Exam: There were no vitals filed for this visit. Wt Readings from Last 3 Encounters:  02/22/21 117 kg  02/04/21 120.1 kg  01/27/21 119.6 kg    Labs: Lab Results  Component Value Date   NA 139 02/22/2021   K 4.5 02/22/2021   CL 102 02/22/2021   CO2 22 02/22/2021   GLUCOSE 158 (H) 02/22/2021   BUN 20 02/22/2021   CREATININE 1.05 (H) 02/22/2021   CALCIUM 9.1 02/22/2021   PHOS 2.9 06/02/2020   MG 2.2 06/04/2020   Lab Results  Component Value Date   INR 1.2 11/17/2020   Lab Results  Component Value Date   CHOL 184 06/01/2020   HDL 51 06/01/2020   LDLCALC 120 (H) 06/01/2020   TRIG 67 06/01/2020     GEN- The patient is well appearing, alert and oriented x 3 today.   Head- normocephalic, atraumatic Eyes-  Sclera clear, conjunctiva pink Ears- hearing intact Oropharynx- clear Neck- supple, no JVP Lymph- no cervical lymphadenopathy Lungs- Clear to ausculation bilaterally, normal work of breathing Heart- irregular rate and rhythm, 2/6 systolic murmur, rubs or gallops, PMI not laterally displaced GI- soft, NT, ND, + BS Extremities- no clubbing, cyanosis, 2+ edema with statis dermatitis, rt leg weeping, 2 large water blisters left leg  MS- no significant deformity or atrophy Skin- no rash or lesion Psych- euthymic mood, full affect Neuro- strength and sensation are intact  EKG-afib at 108  mpm,  qrs int 94 ms, qtc 479   bms     LHC- 06/02/20-The left ventricular systolic function is normal.  LV end diastolic pressure is normal.  The left ventricular ejection fraction is 50-55% by visual estimate.  There is no aortic valve stenosis.  Minimal, nobstructive CAD. Tortuous cororany arteries.   Echo-1. Left ventricular ejection fraction, by estimation, is 55 to 60%. The  left ventricle has normal function. The left ventricle has no regional  wall motion abnormalities. There is mild left ventricular hypertrophy.  Left  ventricular diastolic parameters  are indeterminate.  2. Right ventricular systolic function is mildly reduced. The right  ventricular size is normal. There is normal pulmonary artery systolic  pressure. The estimated right ventricular systolic pressure is 49.6 mmHg.  3. Left atrial size was mildly dilated.  4. The mitral valve is abnormal. Trivial mitral valve regurgitation. No  evidence of mitral stenosis.  5. The aortic valve is abnormal. Aortic valve regurgitation is trivial.  Moderate aortic valve stenosis. Aortic valve mean gradient measures 20.0  mmHg.  6. The inferior vena cava is normal in size with greater than 50%  respiratory variability, suggesting right atrial pressure of 3 mmHg.   Assessment and Plan: 1. 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 Lambert,02/22/21,  amiodarone load started and lasix changed to torsemide 20 mg bid. While on this dose had less LLE and shortness of breath, 20 lb weight loss.  With reduction to 20 mg daily the dyspnea, LLE with water blisters and weeping has returned. Will increase back to torsemide 20 mg bid and refer to wound clinic to dress and manage LLE fluid.  Will schedule CV for 4/4  Cbc/cmet today   2. CHA2DS2VASc score of  at least 5  Continue eliquis 5 mg bid  No missed doses since 3/08  3. Renal growth  s/p surgery  4. Moderate AS By echo 05/2021 She will need to have this updated when she returns to SR   F/u one week after CV    Radek Carnero C. Crystie Yanko,  [facility] [address] [place], [place] [address] [phone]

## 2021-03-11 ENCOUNTER — Other Ambulatory Visit (HOSPITAL_COMMUNITY): Payer: Self-pay | Admitting: *Deleted

## 2021-03-17 ENCOUNTER — Other Ambulatory Visit: Payer: Self-pay

## 2021-03-17 ENCOUNTER — Encounter: Payer: Medicare Other | Attending: Physician Assistant | Admitting: Physician Assistant

## 2021-03-17 DIAGNOSIS — E11622 Type 2 diabetes mellitus with other skin ulcer: Secondary | ICD-10-CM | POA: Diagnosis not present

## 2021-03-17 DIAGNOSIS — Z7901 Long term (current) use of anticoagulants: Secondary | ICD-10-CM | POA: Diagnosis not present

## 2021-03-17 DIAGNOSIS — I872 Venous insufficiency (chronic) (peripheral): Secondary | ICD-10-CM | POA: Insufficient documentation

## 2021-03-17 DIAGNOSIS — I480 Paroxysmal atrial fibrillation: Secondary | ICD-10-CM | POA: Diagnosis not present

## 2021-03-17 DIAGNOSIS — L97822 Non-pressure chronic ulcer of other part of left lower leg with fat layer exposed: Secondary | ICD-10-CM | POA: Insufficient documentation

## 2021-03-17 DIAGNOSIS — L97212 Non-pressure chronic ulcer of right calf with fat layer exposed: Secondary | ICD-10-CM | POA: Diagnosis not present

## 2021-03-17 DIAGNOSIS — L97812 Non-pressure chronic ulcer of other part of right lower leg with fat layer exposed: Secondary | ICD-10-CM | POA: Diagnosis not present

## 2021-03-18 ENCOUNTER — Ambulatory Visit (HOSPITAL_COMMUNITY)
Admission: RE | Admit: 2021-03-18 | Discharge: 2021-03-18 | Disposition: A | Discharge status: Home / Self Care | Payer: Medicare Other | Source: Ambulatory Visit | Attending: Interventional Radiology | Admitting: Interventional Radiology

## 2021-03-18 DIAGNOSIS — N2889 Other specified disorders of kidney and ureter: Secondary | ICD-10-CM | POA: Diagnosis not present

## 2021-03-18 DIAGNOSIS — I722 Aneurysm of renal artery: Secondary | ICD-10-CM | POA: Diagnosis not present

## 2021-03-18 DIAGNOSIS — N200 Calculus of kidney: Secondary | ICD-10-CM | POA: Diagnosis not present

## 2021-03-18 DIAGNOSIS — D49512 Neoplasm of unspecified behavior of left kidney: Secondary | ICD-10-CM | POA: Diagnosis not present

## 2021-03-18 MED ORDER — IOHEXOL 300 MG/ML  SOLN
100.0000 mL | Freq: Once | INTRAMUSCULAR | Status: AC | PRN
  Administered 2021-03-18: 100 mL via INTRAVENOUS

## 2021-03-19 ENCOUNTER — Other Ambulatory Visit (HOSPITAL_COMMUNITY)
Admission: RE | Admit: 2021-03-19 | Discharge: 2021-03-19 | Disposition: A | Discharge status: Home / Self Care | Payer: Medicare Other | Source: Ambulatory Visit | Attending: Internal Medicine | Admitting: Internal Medicine

## 2021-03-19 DIAGNOSIS — Z20822 Contact with and (suspected) exposure to covid-19: Secondary | ICD-10-CM | POA: Insufficient documentation

## 2021-03-19 DIAGNOSIS — Z01812 Encounter for preprocedural laboratory examination: Secondary | ICD-10-CM | POA: Diagnosis not present

## 2021-03-19 LAB — SARS CORONAVIRUS 2 (TAT 6-24 HRS): SARS Coronavirus 2: NEGATIVE

## 2021-03-20 NOTE — Anesthesia Preprocedure Evaluation (Addendum)
Anesthesia Evaluation  Patient identified by MRN, date of birth, ID band Patient awake    Reviewed: Allergy & Precautions, NPO status , Patient's Chart, lab work & pertinent test results, reviewed documented beta blocker date and time   History of Anesthesia Complications Negative for: history of anesthetic complications  Airway Mallampati: II  TM Distance: >3 FB Neck ROM: Full    Dental  (+) Missing,    Pulmonary neg pulmonary ROS,    Pulmonary exam normal        Cardiovascular hypertension, Pt. on home beta blockers and Pt. 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  Reproductive/Obstetrics negative OB ROS                            Anesthesia Physical Anesthesia Plan  ASA: III  Anesthesia Plan: General   Post-op Pain Management:    Induction: Intravenous  PONV Risk Score and Plan: Treatment may vary due to age or medical condition and Propofol infusion  Airway Management Planned: Mask  Additional Equipment: None  Intra-op Plan:   Post-operative Plan:   Informed Consent: I have reviewed the patients History and Physical, chart, labs and discussed the procedure including the risks, benefits and alternatives for the proposed anesthesia with the patient or authorized representative who has indicated his/her understanding and acceptance.       Plan Discussed with: CRNA  Anesthesia Plan  Comments:        Anesthesia Quick Evaluation

## 2021-03-21 ENCOUNTER — Encounter (HOSPITAL_COMMUNITY): Payer: Self-pay | Admitting: Internal Medicine

## 2021-03-21 ENCOUNTER — Encounter (HOSPITAL_COMMUNITY)
Admission: RE | Disposition: A | Discharge status: Home / Self Care | Payer: Self-pay | Source: Home / Self Care | Attending: Internal Medicine

## 2021-03-21 ENCOUNTER — Ambulatory Visit (HOSPITAL_COMMUNITY): Payer: Medicare Other | Admitting: Anesthesiology

## 2021-03-21 ENCOUNTER — Ambulatory Visit (HOSPITAL_COMMUNITY)
Admission: RE | Admit: 2021-03-21 | Discharge: 2021-03-21 | Disposition: A | Discharge status: Home / Self Care | Payer: Medicare Other | Attending: Internal Medicine | Admitting: Internal Medicine

## 2021-03-21 DIAGNOSIS — I4891 Unspecified atrial fibrillation: Secondary | ICD-10-CM | POA: Insufficient documentation

## 2021-03-21 DIAGNOSIS — I4819 Other persistent atrial fibrillation: Secondary | ICD-10-CM

## 2021-03-21 DIAGNOSIS — I2510 Atherosclerotic heart disease of native coronary artery without angina pectoris: Secondary | ICD-10-CM | POA: Diagnosis not present

## 2021-03-21 DIAGNOSIS — I10 Essential (primary) hypertension: Secondary | ICD-10-CM | POA: Insufficient documentation

## 2021-03-21 DIAGNOSIS — Z887 Allergy status to serum and vaccine status: Secondary | ICD-10-CM | POA: Diagnosis not present

## 2021-03-21 DIAGNOSIS — N2889 Other specified disorders of kidney and ureter: Secondary | ICD-10-CM | POA: Diagnosis not present

## 2021-03-21 DIAGNOSIS — Z9049 Acquired absence of other specified parts of digestive tract: Secondary | ICD-10-CM | POA: Diagnosis not present

## 2021-03-21 DIAGNOSIS — Z8249 Family history of ischemic heart disease and other diseases of the circulatory system: Secondary | ICD-10-CM | POA: Insufficient documentation

## 2021-03-21 DIAGNOSIS — Z7901 Long term (current) use of anticoagulants: Secondary | ICD-10-CM | POA: Diagnosis not present

## 2021-03-21 DIAGNOSIS — Z888 Allergy status to other drugs, medicaments and biological substances status: Secondary | ICD-10-CM | POA: Diagnosis not present

## 2021-03-21 DIAGNOSIS — Z79899 Other long term (current) drug therapy: Secondary | ICD-10-CM | POA: Diagnosis not present

## 2021-03-21 DIAGNOSIS — Z90710 Acquired absence of both cervix and uterus: Secondary | ICD-10-CM | POA: Diagnosis not present

## 2021-03-21 DIAGNOSIS — Z833 Family history of diabetes mellitus: Secondary | ICD-10-CM | POA: Diagnosis not present

## 2021-03-21 DIAGNOSIS — E119 Type 2 diabetes mellitus without complications: Secondary | ICD-10-CM | POA: Diagnosis not present

## 2021-03-21 DIAGNOSIS — I252 Old myocardial infarction: Secondary | ICD-10-CM | POA: Insufficient documentation

## 2021-03-21 DIAGNOSIS — E785 Hyperlipidemia, unspecified: Secondary | ICD-10-CM | POA: Diagnosis not present

## 2021-03-21 HISTORY — PX: CARDIOVERSION: SHX1299

## 2021-03-21 SURGERY — CARDIOVERSION
Anesthesia: General

## 2021-03-21 MED ORDER — SODIUM CHLORIDE 0.9 % IV SOLN: INTRAVENOUS | Status: DC | PRN

## 2021-03-21 MED ORDER — PROMETHAZINE HCL 25 MG/ML IJ SOLN: 6.2500 mg | INTRAMUSCULAR | Status: DC | PRN

## 2021-03-21 MED ORDER — LIDOCAINE HCL (CARDIAC) PF 100 MG/5ML IV SOSY
PREFILLED_SYRINGE | INTRAVENOUS | Status: DC | PRN
  Administered 2021-03-21: 60 mg via INTRAVENOUS

## 2021-03-21 MED ORDER — PROPOFOL 10 MG/ML IV BOLUS
INTRAVENOUS | Status: DC | PRN
  Administered 2021-03-21: 70 mg via INTRAVENOUS

## 2021-03-21 NOTE — Interval H&P Note (Signed)
History and Physical Interval Note:  03/21/2021 7:24 AM  [name] [name]  has presented today for surgery, with the diagnosis of AFIB.  The various methods of treatment have been discussed with the patient and family. After consideration of risks, benefits and other options for treatment, the patient has consented to  Procedure(s): CARDIOVERSION (N/A) as a surgical intervention.  [name] history has been reviewed, patient examined, no change in status, stable for surgery.  I have reviewed the patient's chart and labs.  Questions were answered to the patient's satisfaction.     Veron Senner A Emika Tiano

## 2021-03-21 NOTE — Discharge Instructions (Signed)
Electrical Cardioversion Electrical cardioversion is the delivery of a jolt of electricity to restore a normal rhythm to the heart. A rhythm that is too fast or is not regular keeps the heart from pumping well. In this procedure, sticky patches or metal paddles are placed on the chest to deliver electricity to the heart from a device. This procedure may be done in an emergency if:  There is low or no blood pressure as a result of the heart rhythm.  Normal rhythm must be restored as fast as possible to protect the brain and heart from further damage.  It may save a life. This may also be a scheduled procedure for irregular or fast heart rhythms that are not immediately life-threatening. Tell a health care provider about:  Any allergies you have.  All medicines you are taking, including vitamins, herbs, eye drops, creams, and over-the-counter medicines.  Any problems you or family members have had with anesthetic medicines.  Any blood disorders you have.  Any surgeries you have had.  Any medical conditions you have.  Whether you are pregnant or may be pregnant. What are the risks? Generally, this is a safe procedure. However, problems may occur, including:  Allergic reactions to medicines.  A blood clot that breaks free and travels to other parts of your body.  The possible return of an abnormal heart rhythm within hours or days after the procedure.  Your heart stopping (cardiac arrest). This is rare. What happens before the procedure? Medicines  Your health care provider may have you start taking: ? Blood-thinning medicines (anticoagulants) so your blood does not clot as easily. ? Medicines to help stabilize your heart rate and rhythm.  Ask your health care provider about: ? Changing or stopping your regular medicines. This is especially important if you are taking diabetes medicines or blood thinners. ? Taking medicines such as aspirin and ibuprofen. These medicines can  thin your blood. Do not take these medicines unless your health care provider tells you to take them. ? Taking over-the-counter medicines, vitamins, herbs, and supplements. General instructions  Follow instructions from your health care provider about eating or drinking restrictions.  Plan to have someone take you home from the hospital or clinic.  If you will be going home right after the procedure, plan to have someone with you for 24 hours.  Ask your health care provider what steps will be taken to help prevent infection. These may include washing your skin with a germ-killing soap. What happens during the procedure?  An IV will be inserted into one of your veins.  Sticky patches (electrodes) or metal paddles may be placed on your chest.  You will be given a medicine to help you relax (sedative).  An electrical shock will be delivered. The procedure may vary among health care providers and hospitals.   What can I expect after the procedure?  Your blood pressure, heart rate, breathing rate, and blood oxygen level will be monitored until you leave the hospital or clinic.  Your heart rhythm will be watched to make sure it does not change.  You may have some redness on the skin where the shocks were given. Follow these instructions at home:  Do not drive for 24 hours if you were given a sedative during your procedure.  Take over-the-counter and prescription medicines only as told by your health care provider.  Ask your health care provider how to check your pulse. Check it often.  Rest for 48 hours after the procedure   or as told by your health care provider.  Avoid or limit your caffeine use as told by your health care provider.  Keep all follow-up visits as told by your health care provider. This is important. Contact a health care provider if:  You feel like your heart is beating too quickly or your pulse is not regular.  You have a serious muscle cramp that does not go  away. Get help right away if:  You have discomfort in your chest.  You are dizzy or you feel faint.  You have trouble breathing or you are short of breath.  Your speech is slurred.  You have trouble moving an arm or leg on one side of your body.  Your fingers or toes turn cold or blue. Summary  Electrical cardioversion is the delivery of a jolt of electricity to restore a normal rhythm to the heart.  This procedure may be done right away in an emergency or may be a scheduled procedure if the condition is not an emergency.  Generally, this is a safe procedure.  After the procedure, check your pulse often as told by your health care provider. This information is not intended to replace advice given to you by your health care provider. Make sure you discuss any questions you have with your health care provider. Document Revised: 07/07/2019 Document Reviewed: 07/07/2019 Elsevier Patient Education  2021 Elsevier Inc.  

## 2021-03-21 NOTE — Anesthesia Postprocedure Evaluation (Signed)
Anesthesia Post Note  Patient: [name] [name]  Procedure(s) Performed: CARDIOVERSION (N/A )     Patient location during evaluation: PACU Anesthesia Type: General Level of consciousness: awake and alert and oriented Pain management: pain level controlled Vital Signs Assessment: post-procedure vital signs reviewed and stable Respiratory status: spontaneous breathing, nonlabored ventilation and respiratory function stable Cardiovascular status: blood pressure returned to baseline Postop Assessment: no apparent nausea or vomiting Anesthetic complications: no   No complications documented.  Last Vitals:  Vitals:   03/21/21 0700  BP: 126/78  Pulse: 85  Resp: 15  Temp: 36.7 C  SpO2: 97%    Last Pain:  Vitals:   03/21/21 0700  TempSrc: Temporal  PainSc: 0-No pain                 Brennan Bailey

## 2021-03-21 NOTE — Transfer of Care (Signed)
Immediate Anesthesia Transfer of Care Note  Patient: [name] [name]  Procedure(s) Performed: CARDIOVERSION (N/A )  Patient Location: Endoscopy Unit  Anesthesia Type:General  Level of Consciousness: awake, drowsy and patient cooperative  Airway & Oxygen Therapy: Patient Spontanous Breathing  Post-op Assessment: Report given to RN, Post -op Vital signs reviewed and stable and Patient moving all extremities X 4  Post vital signs: Reviewed and stable  Last Vitals:  Vitals Value Taken Time  BP    Temp    Pulse    Resp    SpO2      Last Pain:  Vitals:   03/21/21 0700  TempSrc: Temporal  PainSc: 0-No pain         Complications: No complications documented.

## 2021-03-21 NOTE — CV Procedure (Signed)
   Electrical Cardioversion Procedure Note [name] [name] [id number] January 29, 1941  Procedure: Electrical Cardioversion Indications:  Atrial Fibrillation  Time Out: Verified patient identification, verified procedure,medications/allergies/relevent history reviewed, required imaging and test results available.  Performed  Procedure Details  The patient was NPO after midnight. Anesthesia was administered at the beside  by Dr.Howze with 60mg  of lidocaine and 70 mg propofol.  Cardioversion was done with synchronized biphasic defibrillation with AP pads with 200 Joules.  The patient converted to normal sinus rhythm. The patient tolerated the procedure well   IMPRESSION:  Successful cardioversion of atrial fibrillation    [name] [name] 03/21/2021, 7:32 AM

## 2021-03-22 ENCOUNTER — Encounter: Payer: Self-pay | Admitting: *Deleted

## 2021-03-22 ENCOUNTER — Encounter: Payer: Medicare Other | Attending: Physician Assistant

## 2021-03-22 ENCOUNTER — Other Ambulatory Visit: Payer: Self-pay

## 2021-03-22 ENCOUNTER — Ambulatory Visit
Admission: RE | Admit: 2021-03-22 | Discharge: 2021-03-22 | Disposition: A | Discharge status: Home / Self Care | Payer: Medicare Other | Source: Ambulatory Visit | Attending: Interventional Radiology | Admitting: Interventional Radiology

## 2021-03-22 DIAGNOSIS — L97812 Non-pressure chronic ulcer of other part of right lower leg with fat layer exposed: Secondary | ICD-10-CM | POA: Diagnosis not present

## 2021-03-22 DIAGNOSIS — Z7901 Long term (current) use of anticoagulants: Secondary | ICD-10-CM | POA: Insufficient documentation

## 2021-03-22 DIAGNOSIS — I872 Venous insufficiency (chronic) (peripheral): Secondary | ICD-10-CM | POA: Diagnosis not present

## 2021-03-22 DIAGNOSIS — L97822 Non-pressure chronic ulcer of other part of left lower leg with fat layer exposed: Secondary | ICD-10-CM | POA: Insufficient documentation

## 2021-03-22 DIAGNOSIS — E11622 Type 2 diabetes mellitus with other skin ulcer: Secondary | ICD-10-CM | POA: Insufficient documentation

## 2021-03-22 DIAGNOSIS — N2889 Other specified disorders of kidney and ureter: Secondary | ICD-10-CM

## 2021-03-22 DIAGNOSIS — I480 Paroxysmal atrial fibrillation: Secondary | ICD-10-CM | POA: Diagnosis not present

## 2021-03-22 DIAGNOSIS — Z9889 Other specified postprocedural states: Secondary | ICD-10-CM | POA: Diagnosis not present

## 2021-03-22 DIAGNOSIS — C642 Malignant neoplasm of left kidney, except renal pelvis: Secondary | ICD-10-CM | POA: Diagnosis not present

## 2021-03-22 HISTORY — PX: IR RADIOLOGIST EVAL & MGMT: IMG5224

## 2021-03-22 NOTE — Progress Notes (Signed)
Attempted, unable to leave message/bt 

## 2021-03-22 NOTE — Progress Notes (Signed)
Chief Complaint: Left Renal Tumor, SP Ablation  Referring Physician(s): Jacalyn Lefevre, MD  History of Present Illness: [name] [name] is a 80 y.o. female presenting as a scheduled follow up to [place] clinic today, SP treatment of left renal tumor with biopsy.   She joins [place] today virtually, and we confirmed her identity with 2 personal identifiers.   She was treated on 11/17/2020, with image guided biopsy and microwave ablation of left renal tumor.  This path was:   She presents today for 3 month follow up. She continues to recover well.  No back or flank pain, no hematuria, no dysuria, no fevers/chills.  Status post cardioversion yesterday for atrial fibrillation which was uncomplicated.    CT on 03/18/21 showed excellent treatment response without residual enhancement.  On delayed phases, there is a linear track within the ablative cavity from the probe which is in communication with the collecting system, all intra-capsular.      Past Medical History:  Diagnosis Date  . Anemia    hx of  . Arthritis    knee and shoulders  . Atrial fibrillation ([place])   . Carpal tunnel syndrome on right   . Coronary artery disease 05/2020   Minimal, nobstructive CAD. Tortuous cororany arteries.  . Diabetes mellitus without complication (HCC)    diet controlled no meds in 5-6 yrs  . Dyspnea    on exertion   . Dysrhythmia 05/2020   afib  . GERD (gastroesophageal reflux disease)    No longer taking medications  . Hematuria   . History of hypothyroidism 30 yrs ago  . History of kidney stones   . Hypertension   . Lower extremity edema    chronic lower extremity edema   . Myocardial infarction [facility])    patient mention questionable MI  . Renal mass     Past Surgical History:  Procedure Laterality Date  . ABDOMINAL HYSTERECTOMY  1992   complete  . BILATERAL CARPAL TUNNEL RELEASE  yrs ago  . CARDIOVERSION N/A 09/23/2020   Procedure: CARDIOVERSION;  Surgeon: Freada Bergeron, MD;   Location: [facility] ENDOSCOPY;  Service: Cardiovascular;  Laterality: N/A;  . CHOLECYSTECTOMY  1996  . CYSTOSCOPY/URETEROSCOPY/HOLMIUM LASER/STENT PLACEMENT Bilateral 08/19/2020   Procedure: CYSTOSCOPY BILATERAL  RETROGRADE  URETEROSCOPY/HOLMIUM LASER/STENT PLACEMENT;  Surgeon: Irine Seal, MD;  Location: [facility];  Service: Urology;  Laterality: Bilateral;  . HAMMER TOE SURGERY Right yrs ago  . IR RADIOLOGIST EVAL & MGMT  10/26/2020  . IR RADIOLOGIST EVAL & MGMT  01/06/2021  . LEFT HEART CATH AND CORONARY ANGIOGRAPHY N/A 06/02/2020   Procedure: LEFT HEART CATH AND CORONARY ANGIOGRAPHY;  Surgeon: Jettie Booze, MD;  Location: [place] [facility] LAB;  Service: Cardiovascular;  Laterality: N/A;  . RADIOLOGY WITH ANESTHESIA N/A 11/17/2020   Procedure: RADIOLOGY WITH ANESTHESIA RENAL MICROWAVE ABLATION;  Surgeon: Suzette Battiest, MD;  Location: [facility] ORS;  Service: Radiology;  Laterality: N/A;  . TONSILLECTOMY  age 6    Allergies: Tetanus-diphtheria toxoids td and Cardizem [diltiazem]  Medications: Prior to Admission medications   Medication Sig Start Date End Date Taking? Authorizing Provider  acetaminophen (TYLENOL) 500 MG tablet Take 500-1,000 mg by mouth every 6 (six) hours as needed for moderate pain or headache.     [provider]  apixaban (ELIQUIS) 5 MG TABS tablet Take 1 tablet (5 mg total) by mouth 2 (two) times daily. 09/30/20   Sherran Needs, NP  Calcium Carb-Cholecalciferol (CALCIUM 600+D) 600-800 MG-UNIT  TABS Take 1 tablet by mouth every evening.     [provider]  furosemide (LASIX) 20 MG tablet Take one tablet by mouth daily Patient taking differently: Take 20 mg by mouth daily.  09/30/20   Sherran Needs, NP  hydrocortisone cream 0.5 % Apply topically 3 (three) times daily. Patient not taking: Reported on 11/15/2020 06/04/20   Lavina Hamman, MD  Hydrocortisone-Aloe Vera ([id number]-00/PQZR EX) Apply 1 application topically in the morning and at  bedtime.    [provider]  Liniments (BLUE-EMU SUPER STRENGTH) CREA Apply 1 application topically 2 (two) times daily as needed (pain.).     [provider]  loperamide (IMODIUM A-D) 2 MG tablet Take 2-4 mg by mouth as needed for diarrhea or loose stools.     [provider]  metoprolol tartrate (LOPRESSOR) 25 MG tablet Take 1 tablet (25 mg total) by mouth 2 (two) times daily. 09/30/20   Sherran Needs, NP  Multiple Vitamin (MULTIVITAMIN WITH MINERALS) TABS tablet Take 1 tablet by mouth daily.    [provider]  Multiple Vitamins-Minerals (PRESERVISION AREDS 2 PO) Take 1 capsule by mouth 2 (two) times daily.     [provider]  nystatin (MYCOSTATIN/NYSTOP) powder Apply 1 application topically 3 (three) times daily. To rash in groin Patient taking differently: Apply 1 application topically 2 (two) times daily as needed (rash/irritation.).  08/19/20   Irine Seal, MD  Potassium Chloride ER 20 MEQ TBCR Take one tablet by mouth daily Patient taking differently: Take 20 mEq by mouth daily.  09/30/20   Sherran Needs, NP     Family History  Problem Relation Age of Onset  . Hypertension Mother   . Hypertension Father   . Diabetes Paternal Grandmother     Social History   Socioeconomic History  . Marital status: Married    Spouse name: Not on file  . Number of children: Not on file  . Years of education: Not on file  . Highest education level: Not on file  Occupational History  . Not on file  Tobacco Use  . Smoking status: Never Smoker  . Smokeless tobacco: Never Used  Vaping Use  . Vaping Use: Never used  Substance and Sexual Activity  . Alcohol use: Not Currently  . Drug use: Never  . Sexual activity: Not Currently    Birth control/protection: Surgical  Other Topics Concern  . Not on file  Social History Narrative  . Not on file   Social Determinants of Health   Financial Resource Strain: Not on file  Food Insecurity: Not on  file  Transportation Needs: Not on file  Physical Activity: Not on file  Stress: Not on file  Social Connections: Not on file    Review of Systems: A 12 point ROS discussed and pertinent positives are indicated in the HPI above.  All other systems are negative.  No direct physical exam was performed (except for noted visual exam findings with Video Visits).     Vital Signs: There were no vitals taken for this visit.  Imaging: CT Abdomen pelvis (08/05/20)   CT Abdomen/pelvis (03/18/21)      Labs:  CBC: Recent Labs    07/09/20 1215 08/19/20 0630 09/14/20 1536 11/17/20 0724 03/10/21 1423  WBC 9.5  --  8.7 10.0 7.5  HGB 12.1 12.2 11.3* 11.3* 12.3  HCT 38.6 36.0 38.4 36.7 41.1  PLT 266  --  275 245 250    COAGS: Recent Labs  11/17/20 0724  INR 1.2    BMP: Recent Labs    06/04/20 0529 06/23/20 1518 07/09/20 1215 08/19/20 0630 09/14/20 1536 11/17/20 0724 02/04/21 1340 02/22/21 1110 03/10/21 1423  NA 139 142 142   < > 142 142 136 139 142  K 3.6 4.1 3.4*   < > 4.5 3.7 4.5 4.5 4.1  CL 109 107 108   < > 109 111 103 102 104  CO2 25 25 22   --  24 22 20* 22 28  GLUCOSE 106* 110* 123*   < > 100* 167* 125* 158* 137*  BUN 12 14 13    < > 21 24* 23 20 17   CALCIUM 8.4* 8.9 9.2  --  9.3 9.0 9.3 9.1 9.0  CREATININE 0.77 0.85 0.74   < > 0.95 0.80 1.20* 1.05* 1.14*  GFRNONAA >60 >60 >60  --  58* >60 47*  --  50*  GFRAA >60 >60 >60  --  >60  --   --   --   --    < > = values in this interval not displayed.    LIVER FUNCTION TESTS: Recent Labs    06/02/20 0450 06/04/20 0529 02/22/21 1110 03/10/21 1423  BILITOT 0.6 0.8 1.6* 1.1  AST 28 25 28 29   ALT 21 22 21 25   ALKPHOS 62 71 163* 111  PROT 6.1* 6.6 6.5 7.0  ALBUMIN 3.3* 3.6 3.7 3.2*    TUMOR MARKERS: No results for input(s): AFPTM, CEA, CA199, CHROMGRNA in the last 8760 hours.  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   3 month follow up CT shows excellent treatment response but complicated by small, linear urinoma within the ablated mass.  -Plan for IR clinic follow up in 3 months with repeat CT abdomen renal mass protocol -Vigilance for signs/symptoms of urinary tract infection -follow up with Urology as planned    Electronically Signed: Rosanne Ashing Cameka Rae 03/22/2021, 10:19 AM   I spent a total of    25 Minutes in remote  clinical consultation, greater than 50% of which was counseling/coordinating care for SP image guided microwave ablation of left RCC.    Visit type: Audio and video (Webex).   Alternative for in-person consultation at [facility], [address], [place], [place]. This visit type was conducted due to national recommendations for restrictions regarding the COVID-19 Pandemic (e.g. social distancing).  This format is felt to be most appropriate for this patient at this time.  All issues noted in this document were discussed and addressed.

## 2021-03-23 ENCOUNTER — Encounter (HOSPITAL_COMMUNITY): Payer: Self-pay | Admitting: Internal Medicine

## 2021-03-25 NOTE — Progress Notes (Signed)
GENNI, BUSKE ([id number]) Visit Report for 03/17/2021 Allergy List Details Patient Name: [name] [name], [name] [name]. Date of Service: 03/17/2021 9:30 AM Medical Record Number: [id number] Patient Account Number: [id number] Date of Birth/Sex: 1941/10/09 (80 y.o. F) Treating RN: Carlene Coria Primary Care Romaine Maciolek: Sela Hilding Other Clinician: Jeanine Luz Referring Harmoni Lucus: Sela Hilding Treating Denny Mccree/Extender: Jeri Cos Weeks in Treatment: 0 Allergies Active Allergies diphtheria, pertussis, tetanus vaccine Cardizem Reaction: rash Severity: Mild Allergy Notes Electronic Signature(s) Signed: 03/25/2021 8:11:40 AM By: Carlene Coria RN Entered By: Carlene Coria on 03/17/2021 10:00:16 [name] [name] ([id number]) -------------------------------------------------------------------------------- Arrival Information Details Patient Name: [name] [name]. Date of Service: 03/17/2021 9:30 AM Medical Record Number: [id number] Patient Account Number: [id number] Date of Birth/Sex: June 14, 1941 (80 y.o. F) Treating RN: Carlene Coria Primary Care Emira Eubanks: Sela Hilding Other Clinician: Jeanine Luz Referring Jayvien Rowlette: Sela Hilding Treating Janziel Hockett/Extender: Skipper Cliche in Treatment: 0 Visit Information Patient Arrived: Ambulatory Arrival Time: 09:58 Accompanied By: husband Transfer Assistance: None Patient Identification Verified: Yes Secondary Verification Process Completed: Yes Patient Has Alerts: Yes Patient Alerts: Patient on Blood Thinner type ll diabetic Electronic Signature(s) Signed: 03/17/2021 10:44:56 AM By: Jeanine Luz Entered By: Jeanine Luz on 03/17/2021 10:44:55 Billard, [name] [name] ([id number]) -------------------------------------------------------------------------------- Clinic Level of Care Assessment Details Patient Name: [name] [name]. Date of Service: 03/17/2021 9:30 AM Medical Record Number: [id number] Patient Account Number: [id number] Date of  Birth/Sex: [birth year] (80 y.o. F) Treating RN: Carlene Coria Primary Care Tallie Dodds: Sela Hilding Other Clinician: Jeanine Luz Referring Margarita Croke: Sela Hilding Treating Versa Craton/Extender: Skipper Cliche in Treatment: 0 Clinic Level of Care Assessment Items TOOL 3 Quantity Score X - Use when EandM and Procedure is performed on FOLLOW-UP visit 1 0 ASSESSMENTS - Nursing Assessment / Reassessment X - Reassessment of Co-morbidities (includes updates in patient status) 1 10 X- 1 5 Reassessment of Adherence to Treatment Plan ASSESSMENTS - Wound and Skin Assessment / Reassessment []  - Points for Wound Assessment can only be taken for a new wound of unknown or different etiology and a 0 procedure is NOT performed to that wound []  - 0 Simple Wound Assessment / Reassessment - one wound X- 4 5 Complex Wound Assessment / Reassessment - multiple wounds []  - 0 Dermatologic / Skin Assessment (not related to wound area) ASSESSMENTS - Focused Assessment []  - Circumferential Edema Measurements - multi extremities 0 []  - 0 Nutritional Assessment / Counseling / Intervention []  - 0 Lower Extremity Assessment (monofilament, tuning fork, pulses) []  - 0 Peripheral Arterial Disease Assessment (using hand held doppler) ASSESSMENTS - Ostomy and/or Continence Assessment and Care []  - Incontinence Assessment and Management 0 []  - 0 Ostomy Care Assessment and Management (repouching, etc.) PROCESS - Coordination of Care []  - Points for Discharge Coordination can only be taken for a new wound of unknown or different etiology and a 0 procedure is NOT performed to that wound X- 1 15 Simple Patient / Family Education for ongoing care []  - 0 Complex (extensive) Patient / Family Education for ongoing care []  - 0 Staff obtains Programmer, systems, Records, Test Results / Process Orders []  - 0 Staff telephones HHA, Nursing Homes / Clarify orders / etc []  - 0 Routine Transfer to another Facility  (non-emergent condition) []  - 0 Routine Hospital Admission (non-emergent condition) []  - 0 New Admissions / Biomedical engineer / Ordering NPWT, Apligraf, etc. []  - 0 Emergency Hospital Admission (emergent condition) X- 1 10 Simple Discharge Coordination []  - 0 Complex (extensive) Discharge Coordination PROCESS - Special Needs []  - Pediatric /  Minor Patient Management 0 []  - 0 Isolation Patient Management []  - 0 Hearing / Language / Visual special needs []  - 0 Assessment of Community assistance (transportation, D/C planning, etc.) Spoto, [name] K. ([id number]) []  - 0 Additional assistance / Altered mentation []  - 0 Support Surface(s) Assessment (bed, cushion, seat, etc.) INTERVENTIONS - Wound Cleansing / Measurement []  - Points for Wound Cleaning / Measurement, Wound Dressing, Specimen Collection and Specimen taken to lab 0 can only be taken for a new wound of unknown or different etiology and a procedure is NOT performed to that wound []  - 0 Simple Wound Cleansing - one wound X- 4 5 Complex Wound Cleansing - multiple wounds X- 1 5 Wound Imaging (photographs - any number of wounds) []  - 0 Wound Tracing (instead of photographs) []  - 0 Simple Wound Measurement - one wound X- 4 5 Complex Wound Measurement - multiple wounds INTERVENTIONS - Wound Dressings []  - Small Wound Dressing one or multiple wounds 0 X- 1 15 Medium Wound Dressing one or multiple wounds []  - 0 Large Wound Dressing one or multiple wounds INTERVENTIONS - Miscellaneous []  - External ear exam 0 []  - 0 Specimen Collection (cultures, biopsies, blood, body fluids, etc.) []  - 0 Specimen(s) / Culture(s) sent or taken to Lab for analysis []  - 0 Patient Transfer (multiple staff / Civil Service fast streamer / Similar devices) []  - 0 Simple Staple / Suture removal (25 or less) []  - 0 Complex Staple / Suture removal (26 or more) []  - 0 Hypo / Hyperglycemic Management (close monitor of Blood Glucose) X- 1 15 Ankle /  Brachial Index (ABI) - do not check if billed separately X- 1 5 Vital Signs Has the patient been seen at the hospital within the last three years: Yes Total Score: 140 Level Of Care: New/Established - Level 4 Electronic Signature(s) Signed: 03/25/2021 8:11:40 AM By: Carlene Coria RN Entered By: Carlene Coria on 03/17/2021 11:08:41 [name] [name], [name] [name] ([id number]) -------------------------------------------------------------------------------- Compression Therapy Details Patient Name: [name] [name]. Date of Service: 03/17/2021 9:30 AM Medical Record Number: [id number] Patient Account Number: [id number] Date of Birth/Sex: 05-19-1941 (80 y.o. F) Treating RN: Carlene Coria Primary Care Jathan Balling: Sela Hilding Other Clinician: Jeanine Luz Referring Belanna Manring: Sela Hilding Treating Taylore Hinde/Extender: Skipper Cliche in Treatment: 0 Compression Therapy Performed for Wound Assessment: Wound #1 Right,Anterior Lower Leg Performed By: Clinician Carlene Coria, RN Compression Type: Three Layer Post Procedure Diagnosis Same as Pre-procedure Notes L 0.78, R 0.76 Electronic Signature(s) Signed: 03/25/2021 8:11:40 AM By: Carlene Coria RN Entered By: Carlene Coria on 03/17/2021 11:06:04 Nipper, [name] [name] ([id number]) -------------------------------------------------------------------------------- Compression Therapy Details Patient Name: [name] [name]. Date of Service: 03/17/2021 9:30 AM Medical Record Number: [id number] Patient Account Number: [id number] Date of Birth/Sex: 1941/07/19 (80 y.o. F) Treating RN: Carlene Coria Primary Care Aizlyn Schifano: Sela Hilding Other Clinician: Jeanine Luz Referring Shelaine Frie: Sela Hilding Treating Cleota Pellerito/Extender: Skipper Cliche in Treatment: 0 Compression Therapy Performed for Wound Assessment: Wound #2 Right,Posterior Lower Leg Performed By: Clinician Carlene Coria, RN Compression Type: Three Layer Post Procedure Diagnosis Same as  Pre-procedure Notes L 0.78, R 0.76 Electronic Signature(s) Signed: 03/25/2021 8:11:40 AM By: Carlene Coria RN Entered By: Carlene Coria on 03/17/2021 11:06:04 Sparlin, [name] [name] ([id number]) -------------------------------------------------------------------------------- Compression Therapy Details Patient Name: [name] [name]. Date of Service: 03/17/2021 9:30 AM Medical Record Number: [id number] Patient Account Number: [id number] Date of Birth/Sex: 04-03-1941 (80 y.o. F) Treating RN: Carlene Coria Primary Care Javious Hallisey: Sela Hilding Other Clinician: Jeanine Luz Referring Arnie Clingenpeel: Sela Hilding Treating Olegario Emberson/Extender:  Jeri Cos Weeks in Treatment: 0 Compression Therapy Performed for Wound Assessment: Wound #3 Left,Medial Lower Leg Performed By: Clinician Carlene Coria, RN Compression Type: Three Layer Post Procedure Diagnosis Same as Pre-procedure Notes L 0.78, R 0.76 Electronic Signature(s) Signed: 03/25/2021 8:11:40 AM By: Carlene Coria RN Entered By: Carlene Coria on 03/17/2021 11:06:04 [name] [name], [name] [name] ([id number]) -------------------------------------------------------------------------------- Compression Therapy Details Patient Name: [name] [name]. Date of Service: 03/17/2021 9:30 AM Medical Record Number: [id number] Patient Account Number: [id number] Date of Birth/Sex: 07/05/1941 (80 y.o. F) Treating RN: Carlene Coria Primary Care Anesha Hackert: Sela Hilding Other Clinician: Jeanine Luz Referring Zlata Alcaide: Sela Hilding Treating Marcelia Petersen/Extender: Skipper Cliche in Treatment: 0 Compression Therapy Performed for Wound Assessment: Wound #4 Left,Lateral Lower Leg Performed By: Clinician Carlene Coria, RN Compression Type: Three Layer Post Procedure Diagnosis Same as Pre-procedure Notes L 0.78, R 0.76 Electronic Signature(s) Signed: 03/25/2021 8:11:40 AM By: Carlene Coria RN Entered By: Carlene Coria on 03/17/2021 11:06:04 Lye, [name] [name]  ([id number]) -------------------------------------------------------------------------------- Encounter Discharge Information Details Patient Name: [name] [name]. Date of Service: 03/17/2021 9:30 AM Medical Record Number: [id number] Patient Account Number: [id number] Date of Birth/Sex: February 02, 1941 (80 y.o. F) Treating RN: Carlene Coria Primary Care Trashawn Oquendo: Sela Hilding Other Clinician: Jeanine Luz Referring Morganna Styles: Sela Hilding Treating Yoneko Talerico/Extender: Skipper Cliche in Treatment: 0 Encounter Discharge Information Items Discharge Condition: Stable Ambulatory Status: Ambulatory Discharge Destination: Home Transportation: Private Auto Accompanied By: husband Schedule Follow-up Appointment: Yes Clinical Summary of Care: Electronic Signature(s) Signed: 03/25/2021 8:11:40 AM By: Carlene Coria RN Entered By: Carlene Coria on 03/17/2021 11:21:44 Ganser, [name] [name] ([id number]) -------------------------------------------------------------------------------- Lower Extremity Assessment Details Patient Name: [name] [name]. Date of Service: 03/17/2021 9:30 AM Medical Record Number: [id number] Patient Account Number: [id number] Date of Birth/Sex: 20-May-1941 (80 y.o. F) Treating RN: Carlene Coria Primary Care Laddie Naeem: Sela Hilding Other Clinician: Jeanine Luz Referring Madason Rauls: Sela Hilding Treating Namita Yearwood/Extender: Jeri Cos Weeks in Treatment: 0 Vascular Assessment Blood Pressure: Brachial: [Left:110] [Right:100] Dorsalis Pedis: 70 [Left:Dorsalis Pedis: 80] Ankle: Posterior Tibial: 86 [Left:Posterior Tibial: 84 0.78] [Right:0.76] Electronic Signature(s) Signed: 03/25/2021 8:11:40 AM By: Carlene Coria RN Entered By: Carlene Coria on 03/17/2021 10:37:40 [name] [name], [name] [name] ([id number]) -------------------------------------------------------------------------------- Multi Wound Chart Details Patient Name: [name] [name]. Date of Service: 03/17/2021 9:30  AM Medical Record Number: [id number] Patient Account Number: [id number] Date of Birth/Sex: 10-01-1941 (80 y.o. F) Treating RN: Carlene Coria Primary Care Robby Bulkley: Sela Hilding Other Clinician: Jeanine Luz Referring Anjelita Sheahan: Sela Hilding Treating Diva Lemberger/Extender: Skipper Cliche in Treatment: 0 Vital Signs Height(in): 83 Pulse(bpm): 51 Weight(lbs): 242 Blood Pressure(mmHg): 97/66 Body Mass Index(BMI): 39 Temperature(F): 97.9 Respiratory Rate(breaths/min): 18 Photos: [1:No Photos] [2:No Photos] [3:No Photos] Wound Location: [1:Right, Anterior Lower Leg] [2:Right, Posterior Lower Leg] [3:Left, Medial Lower Leg] Wounding Event: [1:Blister] [2:Blister] [3:Blister] Primary Etiology: [1:Venous Leg Ulcer] [2:Venous Leg Ulcer] [3:Venous Leg Ulcer] Comorbid History: [1:Cataracts, Hypertension, Type II Diabetes, Rheumatoid Arthritis] [2:N/A] [3:Cataracts, Hypertension, Type II Diabetes, Rheumatoid Arthritis] Date Acquired: [1:03/17/2021] [2:03/17/2021] [3:03/17/2021] Weeks of Treatment: [1:0] [2:0] [3:0] Wound Status: [1:Open] [2:Open] [3:Open] Clustered Wound: [1:Yes] [2:No] [3:No] Clustered Quantity: [1:4] [2:N/A] [3:N/A] Measurements L x W x D (cm) [1:12x11x0.1] [2:2.2x1.9x0.1] [3:7.7x7.6x0.1] Area (cm) : [1:103.673] [2:3.283] [3:45.962] Volume (cm) : [1:10.367] [2:0.328] [3:4.596] % Reduction in Area: [1:0.00%] [2:N/A] [3:0.00%] % Reduction in Volume: [1:0.00%] [2:N/A] [3:0.00%] Classification: [1:Full Thickness Without Exposed Support Structures] [2:N/A] [3:Full Thickness Without Exposed Support Structures] Exudate Amount: [1:Large] [2:N/A] [3:Large] Exudate Type: [1:Serous] [2:N/A] [3:Serous] Exudate Color: [1:amber] [2:N/A] [3:amber] Granulation Amount: [1:Large (67-100%)] [2:N/A] [3:Large (67-100%)]  Granulation Quality: [1:Red] [2:N/A] [3:Red] Necrotic Amount: [1:None Present (0%)] [2:N/A] [3:None Present (0%)] Exposed Structures: [1:Fat Layer (Subcutaneous  Tissue): Yes Fascia: No Tendon: No Muscle: No Joint: No Bone: No Large (67-100%)] [2:N/A N/A] [3:Fat Layer (Subcutaneous Tissue): Yes Fascia: No Tendon: No Muscle: No Joint: No Bone: No None] Wound Number: 4 N/A N/A Photos: No Photos N/A N/A Wound Location: Left, Lateral Lower Leg N/A N/A Wounding Event: Blister N/A N/A Primary Etiology: Venous Leg Ulcer N/A N/A Comorbid History: Cataracts, Hypertension, Type II N/A N/A Diabetes, Rheumatoid Arthritis Date Acquired: 03/17/2021 N/A N/A Weeks of Treatment: 0 N/A N/A Wound Status: Open N/A N/A Clustered Wound: No N/A N/A Clustered Quantity: N/A N/A N/A Measurements L x W x D (cm) 3.5x3.7x0.1 N/A N/A Area (cm) : 10.171 N/A N/A Volume (cm) : 1.017 N/A N/A % Reduction in Area: 0.00% N/A N/A % Reduction in Volume: 0.00% N/A N/A Classification: Full Thickness Without Exposed N/A N/A Support Structures [name] [name], [name] [name] ([id number]) Exudate Amount: Large N/A N/A Exudate Type: Serous N/A N/A Exudate Color: amber N/A N/A Granulation Amount: Large (67-100%) N/A N/A Granulation Quality: Red N/A N/A Necrotic Amount: None Present (0%) N/A N/A Exposed Structures: Fat Layer (Subcutaneous Tissue): N/A N/A Yes Fascia: No Tendon: No Muscle: No Joint: No Bone: No Epithelialization: None N/A N/A Treatment Notes Electronic Signature(s) Signed: 03/25/2021 8:11:40 AM By: Carlene Coria RN Entered By: Carlene Coria on 03/17/2021 11:02:19 [name] [name] ([id number]) -------------------------------------------------------------------------------- [place] Details Patient Name: [name] [name]. Date of Service: 03/17/2021 9:30 AM Medical Record Number: [id number] Patient Account Number: [id number] Date of Birth/Sex: 03/09/1941 (80 y.o. F) Treating RN: Carlene Coria Primary Care Jaysie Benthall: Sela Hilding Other Clinician: Jeanine Luz Referring Khamila Bassinger: Sela Hilding Treating Nyesha Cliff/Extender: Skipper Cliche in Treatment:  0 Active Inactive Orientation to the Wound Care Program Nursing Diagnoses: Knowledge deficit related to the wound healing center program Goals: Patient/caregiver will verbalize understanding of the [facility] Program Date Initiated: 03/17/2021 Target Resolution Date: 03/17/2021 Goal Status: Active Interventions: Provide education on orientation to the wound center Notes: Venous Leg Ulcer Nursing Diagnoses: Actual venous Insuffiency (use after diagnosis is confirmed) Goals: Patient will maintain optimal edema control Date Initiated: 03/17/2021 Target Resolution Date: 04/16/2021 Goal Status: Active Patient/caregiver will verbalize understanding of disease process and disease management Date Initiated: 03/17/2021 Target Resolution Date: 03/17/2021 Goal Status: Active Verify adequate tissue perfusion prior to therapeutic compression application Date Initiated: 03/17/2021 Target Resolution Date: 03/17/2021 Goal Status: Active Interventions: Assess peripheral edema status every visit. Compression as ordered Provide education on venous insufficiency Treatment Activities: Therapeutic compression applied : 03/17/2021 Notes: Wound/Skin Impairment Nursing Diagnoses: Impaired tissue integrity Goals: Patient/caregiver will verbalize understanding of skin care regimen Date Initiated: 03/17/2021 Target Resolution Date: 03/17/2021 Goal Status: Active Ulcer/skin breakdown will have a volume reduction of 30% by week 4 Date Initiated: 03/17/2021 Target Resolution Date: 04/16/2021 Goal Status: Active Ulcer/skin breakdown will have a volume reduction of 50% by week 8 TVISHA, SCHWOERER ([id number]) Date Initiated: 03/17/2021 Target Resolution Date: 05/16/2021 Goal Status: Active Ulcer/skin breakdown will have a volume reduction of 80% by week 12 Date Initiated: 03/17/2021 Target Resolution Date: 06/16/2021 Goal Status: Active Ulcer/skin breakdown will heal within 14 weeks Date Initiated:  03/17/2021 Target Resolution Date: 07/16/2021 Goal Status: Active Interventions: Assess patient/caregiver ability to obtain necessary supplies Assess patient/caregiver ability to perform ulcer/skin care regimen upon admission and as needed Assess ulceration(s) every visit Provide education on ulcer and skin care Treatment Activities: Referred to DME Jeanine Caven for dressing  supplies : 03/17/2021 Skin care regimen initiated : 03/17/2021 Notes: Electronic Signature(s) Signed: 03/25/2021 8:11:40 AM By: Carlene Coria RN Entered By: Carlene Coria on 03/17/2021 11:01:48 Nicolini, [name] [name] ([id number]) -------------------------------------------------------------------------------- Pain Assessment Details Patient Name: [name] [name]. Date of Service: 03/17/2021 9:30 AM Medical Record Number: [id number] Patient Account Number: [id number] Date of Birth/Sex: 11-Sep-1941 (80 y.o. F) Treating RN: Carlene Coria Primary Care Kiora Hallberg: Sela Hilding Other Clinician: Jeanine Luz Referring Janella Rogala: Sela Hilding Treating Yanelle Sousa/Extender: Skipper Cliche in Treatment: 0 Active Problems Location of Pain Severity and Description of Pain Patient Has Paino Yes Site Locations Rate the pain. Current Pain Level: 4 Pain Management and Medication Current Pain Management: Electronic Signature(s) Signed: 03/25/2021 8:11:40 AM By: Carlene Coria RN Entered By: Carlene Coria on 03/17/2021 09:59:08 Saulsbury, [name] [name] ([id number]) -------------------------------------------------------------------------------- Patient/Caregiver Education Details Patient Name: [name] [name]. Date of Service: 03/17/2021 9:30 AM Medical Record Number: [id number] Patient Account Number: [id number] Date of Birth/Gender: 28-Mar-1941 (80 y.o. F) Treating RN: Carlene Coria Primary Care Physician: Sela Hilding Other Clinician: Jeanine Luz Referring Physician: Sela Hilding Treating Physician/Extender: Skipper Cliche in  Treatment: 0 Education Assessment Education Provided To: Patient Education Topics Provided Venous: Methods: Explain/Verbal Responses: State content correctly Wound/Skin Impairment: Methods: Explain/Verbal Responses: State content correctly Electronic Signature(s) Signed: 03/25/2021 8:11:40 AM By: Carlene Coria RN Entered By: Carlene Coria on 03/17/2021 11:09:45 Mannis, [name] [name] ([id number]) -------------------------------------------------------------------------------- Wound Assessment Details Patient Name: [name] [name]. Date of Service: 03/17/2021 9:30 AM Medical Record Number: [id number] Patient Account Number: [id number] Date of Birth/Sex: 02-07-1941 (80 y.o. F) Treating RN: Carlene Coria Primary Care Christan Defranco: Sela Hilding Other Clinician: Jeanine Luz Referring Elvin Banker: Sela Hilding Treating Derrien Anschutz/Extender: Skipper Cliche in Treatment: 0 Wound Status Wound Number: 1 Primary Venous Leg Ulcer Etiology: Wound Location: Right, Anterior Lower Leg Wound Status: Open Wounding Event: Blister Comorbid Cataracts, Hypertension, Type II Diabetes, Date Acquired: 03/17/2021 History: Rheumatoid Arthritis Weeks Of Treatment: 0 Clustered Wound: Yes Photos Wound Measurements Length: (cm) 12 Width: (cm) 11 Depth: (cm) 0.1 Clustered Quantity: 4 Area: (cm) 103.673 Volume: (cm) 10.367 % Reduction in Area: 0% % Reduction in Volume: 0% Epithelialization: Large (67-100%) Tunneling: No Undermining: No Wound Description Classification: Full Thickness Without Exposed Support Structu Exudate Amount: Large Exudate Type: Serous Exudate Color: amber res Foul Odor After Cleansing: No Slough/Fibrino No Wound Bed Granulation Amount: Large (67-100%) Exposed Structure Granulation Quality: Red Fascia Exposed: No Necrotic Amount: None Present (0%) Fat Layer (Subcutaneous Tissue) Exposed: Yes Tendon Exposed: No Muscle Exposed: No Joint Exposed: No Bone Exposed:  No Electronic Signature(s) Signed: 03/25/2021 8:11:40 AM By: Carlene Coria RN Entered By: Carlene Coria on 03/17/2021 11:16:37 Dooner, [name] [name] ([id number]) -------------------------------------------------------------------------------- Wound Assessment Details Patient Name: [name] [name]. Date of Service: 03/17/2021 9:30 AM Medical Record Number: [id number] Patient Account Number: [id number] Date of Birth/Sex: 05-Mar-1941 (80 y.o. F) Treating RN: Carlene Coria Primary Care Lynda Wanninger: Sela Hilding Other Clinician: Jeanine Luz Referring Duran Ohern: Sela Hilding Treating Huntley Demedeiros/Extender: Skipper Cliche in Treatment: 0 Wound Status Wound Number: 2 Primary Venous Leg Ulcer Etiology: Wound Location: Right, Posterior Lower Leg Wound Status: Open Wounding Event: Blister Comorbid Cataracts, Hypertension, Type II Diabetes, Date Acquired: 03/17/2021 History: Rheumatoid Arthritis Weeks Of Treatment: 0 Clustered Wound: No Photos Wound Measurements Length: (cm) 2.2 Width: (cm) 1.9 Depth: (cm) 0.1 Area: (cm) 3.283 Volume: (cm) 0.328 % Reduction in Area: 0% % Reduction in Volume: 0% Tunneling: No Undermining: No Wound Description Classification: Full Thickness Without Exposed Support Structure s Foul Odor After Cleansing: No  Slough/Fibrino No Wound Bed Necrotic Amount: None Present (0%) Electronic Signature(s) Signed: 03/25/2021 8:11:40 AM By: Carlene Coria RN Entered By: Carlene Coria on 03/17/2021 11:17:54 Schor, [name] [name] ([id number]) -------------------------------------------------------------------------------- Wound Assessment Details Patient Name: [name] Busman K. Date of Service: 03/17/2021 9:30 AM Medical Record Number: [id number] Patient Account Number: [id number] Date of Birth/Sex: [birth year] (80 y.o. F) Treating RN: Carlene Coria Primary Care Natale Barba: Sela Hilding Other Clinician: Jeanine Luz Referring Megon Kalina: Sela Hilding Treating  Cory Rama/Extender: Skipper Cliche in Treatment: 0 Wound Status Wound Number: 3 Primary Venous Leg Ulcer Etiology: Wound Location: Left, Medial Lower Leg Wound Status: Open Wounding Event: Blister Comorbid Cataracts, Hypertension, Type II Diabetes, Date Acquired: 03/17/2021 History: Rheumatoid Arthritis Weeks Of Treatment: 0 Clustered Wound: No Photos Wound Measurements Length: (cm) 7.7 Width: (cm) 7.6 Depth: (cm) 0.1 Area: (cm) 45.962 Volume: (cm) 4.596 % Reduction in Area: 0% % Reduction in Volume: 0% Epithelialization: None Tunneling: No Undermining: No Wound Description Classification: Full Thickness Without Exposed Support Structures Exudate Amount: Large Exudate Type: Serous Exudate Color: amber Foul Odor After Cleansing: No Slough/Fibrino No Wound Bed Granulation Amount: Large (67-100%) Exposed Structure Granulation Quality: Red Fascia Exposed: No Necrotic Amount: None Present (0%) Fat Layer (Subcutaneous Tissue) Exposed: Yes Tendon Exposed: No Muscle Exposed: No Joint Exposed: No Bone Exposed: No Treatment Notes Wound #3 (Lower Leg) Wound Laterality: Left, Medial Cleanser Soap and Water Discharge Instruction: Gently cleanse wound with antibacterial soap, rinse and pat dry prior to dressing wounds Peri-Wound Care Triamcinolone Acetonide Cream, 0.1%, 15 (g) tube [name] [name], [name] K. ([id number]) Discharge Instruction: Apply on reddened areas Topical Primary Dressing Xtrasorb Classic Super Absorbent Dressing, 6x9 (in/in) Secondary Dressing Secured With Compression Wrap Profore Lite LF 3 Multilayer Compression Bandaging System Discharge Instruction: Apply 3 multi-layer wrap as prescribed. Compression Stockings Add-Ons Electronic Signature(s) Signed: 03/25/2021 8:11:40 AM By: Carlene Coria RN Entered By: Carlene Coria on 03/17/2021 11:15:29 Benoist, [name] [name] ([id number]) -------------------------------------------------------------------------------- Wound  Assessment Details Patient Name: [name] [name]. Date of Service: 03/17/2021 9:30 AM Medical Record Number: [id number] Patient Account Number: [id number] Date of Birth/Sex: 1941/05/03 (80 y.o. F) Treating RN: Carlene Coria Primary Care Caitlan Chauca: Sela Hilding Other Clinician: Jeanine Luz Referring Rosmery Duggin: Sela Hilding Treating Hafsah Hendler/Extender: Skipper Cliche in Treatment: 0 Wound Status Wound Number: 4 Primary Venous Leg Ulcer Etiology: Wound Location: Left, Lateral Lower Leg Wound Status: Open Wounding Event: Blister Comorbid Cataracts, Hypertension, Type II Diabetes, Date Acquired: 03/17/2021 History: Rheumatoid Arthritis Weeks Of Treatment: 0 Clustered Wound: No Photos Wound Measurements Length: (cm) 3.5 Width: (cm) 3.7 Depth: (cm) 0.1 Area: (cm) 10.171 Volume: (cm) 1.017 % Reduction in Area: 0% % Reduction in Volume: 0% Epithelialization: None Tunneling: No Undermining: No Wound Description Classification: Full Thickness Without Exposed Support Structures Exudate Amount: Large Exudate Type: Serous Exudate Color: amber Foul Odor After Cleansing: No Slough/Fibrino No Wound Bed Granulation Amount: Large (67-100%) Exposed Structure Granulation Quality: Red Fascia Exposed: No Necrotic Amount: None Present (0%) Fat Layer (Subcutaneous Tissue) Exposed: Yes Tendon Exposed: No Muscle Exposed: No Joint Exposed: No Bone Exposed: No Treatment Notes Wound #4 (Lower Leg) Wound Laterality: Left, Lateral Cleanser Soap and Water Discharge Instruction: Gently cleanse wound with antibacterial soap, rinse and pat dry prior to dressing wounds Peri-Wound Care Triamcinolone Acetonide Cream, 0.1%, 15 (g) tube [name] [name], [name] K. ([id number]) Discharge Instruction: Apply on reddened areas Topical Primary Dressing Xtrasorb Classic Super Absorbent Dressing, 6x9 (in/in) Secondary Dressing Secured With Compression Wrap Profore Lite LF 3 Multilayer Compression  Bandaging System Discharge Instruction: Apply 3 multi-layer  wrap as prescribed. Compression Stockings Add-Ons Electronic Signature(s) Signed: 03/25/2021 8:11:40 AM By: Carlene Coria RN Entered By: Carlene Coria on 03/17/2021 11:16:05 [name] [name], [name] [name] ([id number]) -------------------------------------------------------------------------------- Vitals Details Patient Name: [name] [name]. Date of Service: 03/17/2021 9:30 AM Medical Record Number: [id number] Patient Account Number: [id number] Date of Birth/Sex: [birth year] (80 y.o. F) Treating RN: Carlene Coria Primary Care Petina Muraski: Sela Hilding Other Clinician: Jeanine Luz Referring Mando Blatz: Sela Hilding Treating Seraphim Affinito/Extender: Skipper Cliche in Treatment: 0 Vital Signs Time Taken: 09:55 Temperature (F): 97.9 Height (in): 66 Pulse (bpm): 92 Source: Stated Respiratory Rate (breaths/min): 18 Weight (lbs): 242 Blood Pressure (mmHg): 97/66 Source: Measured Reference Range: 80 - 120 mg / dl Body Mass Index (BMI): 39.1 Electronic Signature(s) Signed: 03/25/2021 8:11:40 AM By: Carlene Coria RN Entered By: Carlene Coria on 03/17/2021 09:58:25

## 2021-03-25 NOTE — Progress Notes (Signed)
[name] [name], [name] [name] ([id number]) Visit Report for 03/22/2021 Arrival Information Details Patient Name: [name] [name], [name] [name]. Date of Service: 03/22/2021 8:15 AM Medical Record Number: [id number] Patient Account Number: [id number] Date of Birth/Sex: May 04, 1941 (80 y.o. F) Treating RN: Carlene Coria Primary Care Emaly Boschert: Sela Hilding Other Clinician: Referring Eder Macek: Sela Hilding Treating Zacharias Ridling/Extender: Skipper Cliche in Treatment: 0 Visit Information History Since Last Visit Added or deleted any medications: No Patient Arrived: Ambulatory Any new allergies or adverse reactions: No Arrival Time: 08:29 Had a fall or experienced change in No Accompanied By: husband activities of daily living that may affect Transfer Assistance: None risk of falls: Patient Identification Verified: Yes Hospitalized since last visit: No Secondary Verification Process Completed: Yes Pain Present Now: No Patient Has Alerts: Yes Patient Alerts: Patient on Blood Thinner type ll diabetic Electronic Signature(s) Signed: 03/25/2021 8:10:37 AM By: Carlene Coria RN Entered By: Carlene Coria on 03/22/2021 08:41:45 Trost, [name] ([id number]) -------------------------------------------------------------------------------- Clinic Level of Care Assessment Details Patient Name: [name] [name]. Date of Service: 03/22/2021 8:15 AM Medical Record Number: [id number] Patient Account Number: [id number] Date of Birth/Sex: 21-Jun-1941 (80 y.o. F) Treating RN: Carlene Coria Primary Care Makailah Slavick: Sela Hilding Other Clinician: Referring Dyllen Menning: Sela Hilding Treating Marria Mathison/Extender: Skipper Cliche in Treatment: 0 Clinic Level of Care Assessment Items TOOL 1 Quantity Score []  - Use when EandM and Procedure is performed on INITIAL visit 0 ASSESSMENTS - Nursing Assessment / Reassessment []  - General Physical Exam (combine w/ comprehensive assessment (listed just below) when performed on new 0 pt. evals) []   - 0 Comprehensive Assessment (HX, ROS, Risk Assessments, Wounds Hx, etc.) ASSESSMENTS - Wound and Skin Assessment / Reassessment []  - Dermatologic / Skin Assessment (not related to wound area) 0 ASSESSMENTS - Ostomy and/or Continence Assessment and Care []  - Incontinence Assessment and Management 0 []  - 0 Ostomy Care Assessment and Management (repouching, etc.) PROCESS - Coordination of Care []  - Simple Patient / Family Education for ongoing care 0 []  - 0 Complex (extensive) Patient / Family Education for ongoing care []  - 0 Staff obtains Programmer, systems, Records, Test Results / Process Orders []  - 0 Staff telephones HHA, Nursing Homes / Clarify orders / etc []  - 0 Routine Transfer to another Facility (non-emergent condition) []  - 0 Routine Hospital Admission (non-emergent condition) []  - 0 New Admissions / Biomedical engineer / Ordering NPWT, Apligraf, etc. []  - 0 Emergency Hospital Admission (emergent condition) PROCESS - Special Needs []  - Pediatric / Minor Patient Management 0 []  - 0 Isolation Patient Management []  - 0 Hearing / Language / Visual special needs []  - 0 Assessment of Community assistance (transportation, D/C planning, etc.) []  - 0 Additional assistance / Altered mentation []  - 0 Support Surface(s) Assessment (bed, cushion, seat, etc.) INTERVENTIONS - Miscellaneous []  - External ear exam 0 []  - 0 Patient Transfer (multiple staff / Civil Service fast streamer / Similar devices) []  - 0 Simple Staple / Suture removal (25 or less) []  - 0 Complex Staple / Suture removal (26 or more) []  - 0 Hypo/Hyperglycemic Management (do not check if billed separately) []  - 0 Ankle / Brachial Index (ABI) - do not check if billed separately Has the patient been seen at the hospital within the last three years: Yes Total Score: 0 Level Of Care: ____ [name] [name] ([id number]) Electronic Signature(s) Signed: 03/25/2021 8:10:37 AM By: Carlene Coria RN Entered By: Carlene Coria on 03/22/2021  08:44:53 Finkler, [name] ([id number]) -------------------------------------------------------------------------------- Compression Therapy Details Patient Name: [name] [name]. Date of  Service: 03/22/2021 8:15 AM Medical Record Number: [id number] Patient Account Number: [id number] Date of Birth/Sex: [birth year] (80 y.o. F) Treating RN: Carlene Coria Primary Care Deija Buhrman: Sela Hilding Other Clinician: Referring Caisen Mangas: Sela Hilding Treating Jamela Cumbo/Extender: Skipper Cliche in Treatment: 0 Compression Therapy Performed for Wound Assessment: Wound #1 Right,Anterior Lower Leg Performed By: Clinician Carlene Coria, RN Compression Type: Three Layer Electronic Signature(s) Signed: 03/25/2021 8:10:37 AM By: Carlene Coria RN Entered By: Carlene Coria on 03/22/2021 08:43:31 Stadler, [name] ([id number]) -------------------------------------------------------------------------------- Compression Therapy Details Patient Name: [name] [name]. Date of Service: 03/22/2021 8:15 AM Medical Record Number: [id number] Patient Account Number: [id number] Date of Birth/Sex: [birth year] (80 y.o. F) Treating RN: Carlene Coria Primary Care Ralphael Southgate: Sela Hilding Other Clinician: Referring Iris Hairston: Sela Hilding Treating Tikia Skilton/Extender: Skipper Cliche in Treatment: 0 Compression Therapy Performed for Wound Assessment: Wound #2 Right,Posterior Lower Leg Performed By: Clinician Carlene Coria, RN Compression Type: Three Layer Electronic Signature(s) Signed: 03/25/2021 8:10:37 AM By: Carlene Coria RN Entered By: Carlene Coria on 03/22/2021 08:43:31 Kissoon, [name] ([id number]) -------------------------------------------------------------------------------- Compression Therapy Details Patient Name: [name] [name]. Date of Service: 03/22/2021 8:15 AM Medical Record Number: [id number] Patient Account Number: [id number] Date of Birth/Sex: 1941-06-03 (80 y.o. F) Treating RN: Carlene Coria Primary Care  Anjanae Woehrle: Sela Hilding Other Clinician: Referring Reggie Welge: Sela Hilding Treating Layza Summa/Extender: Skipper Cliche in Treatment: 0 Compression Therapy Performed for Wound Assessment: Wound #3 Left,Medial Lower Leg Performed By: Clinician Carlene Coria, RN Compression Type: Three Layer Electronic Signature(s) Signed: 03/25/2021 8:10:37 AM By: Carlene Coria RN Entered By: Carlene Coria on 03/22/2021 08:43:31 Schweiss, [name] ([id number]) -------------------------------------------------------------------------------- Compression Therapy Details Patient Name: [name] [name]. Date of Service: 03/22/2021 8:15 AM Medical Record Number: [id number] Patient Account Number: [id number] Date of Birth/Sex: [birth year] (80 y.o. F) Treating RN: Carlene Coria Primary Care Dredyn Gubbels: Sela Hilding Other Clinician: Referring Shadasia Oldfield: Sela Hilding Treating Edra Riccardi/Extender: Skipper Cliche in Treatment: 0 Compression Therapy Performed for Wound Assessment: Wound #4 Left,Lateral Lower Leg Performed By: Clinician Carlene Coria, RN Compression Type: Three Layer Electronic Signature(s) Signed: 03/25/2021 8:10:37 AM By: Carlene Coria RN Entered By: Carlene Coria on 03/22/2021 08:43:32 Midgett, [name] ([id number]) -------------------------------------------------------------------------------- Encounter Discharge Information Details Patient Name: [name] [name]. Date of Service: 03/22/2021 8:15 AM Medical Record Number: [id number] Patient Account Number: [id number] Date of Birth/Sex: April 13, 1941 (80 y.o. F) Treating RN: Carlene Coria Primary Care Amayrani Bennick: Sela Hilding Other Clinician: Referring Kannon Baum: Sela Hilding Treating Chason Mciver/Extender: Skipper Cliche in Treatment: 0 Encounter Discharge Information Items Discharge Condition: Stable Ambulatory Status: Ambulatory Discharge Destination: Home Transportation: Private Auto Accompanied By: husband Schedule Follow-up  Appointment: Yes Clinical Summary of Care: Patient Declined Electronic Signature(s) Signed: 03/25/2021 8:10:37 AM By: Carlene Coria RN Entered By: Carlene Coria on 03/22/2021 08:44:44 Purves, [name] ([id number]) -------------------------------------------------------------------------------- Wound Assessment Details Patient Name: [name] [name]. Date of Service: 03/22/2021 8:15 AM Medical Record Number: [id number] Patient Account Number: [id number] Date of Birth/Sex: January 13, 1941 (80 y.o. F) Treating RN: Carlene Coria Primary Care Wilma Wuthrich: Sela Hilding Other Clinician: Referring Vaibhav Fogleman: Sela Hilding Treating Shenita Trego/Extender: Skipper Cliche in Treatment: 0 Wound Status Wound Number: 1 Primary Venous Leg Ulcer Etiology: Wound Location: Right, Anterior Lower Leg Wound Status: Open Wounding Event: Blister Comorbid Cataracts, Hypertension, Type II Diabetes, Date Acquired: 03/17/2021 History: Rheumatoid Arthritis Weeks Of Treatment: 0 Clustered Wound: Yes Wound Measurements Length: (cm) 12 Width: (cm) 11 Depth: (cm) 0.1 Clustered Quantity: 4 Area: (cm) 103.673 Volume: (cm) 10.367 % Reduction in Area: 0% % Reduction in Volume: 0% Epithelialization:  Large (67-100%) Wound Description Classification: Full Thickness Without Exposed Support Structu Exudate Amount: Large Exudate Type: Serous Exudate Color: amber res Foul Odor After Cleansing: No Slough/Fibrino No Wound Bed Granulation Amount: Large (67-100%) Exposed Structure Granulation Quality: Red Fascia Exposed: No Necrotic Amount: None Present (0%) Fat Layer (Subcutaneous Tissue) Exposed: Yes Tendon Exposed: No Muscle Exposed: No Joint Exposed: No Bone Exposed: No Treatment Notes Wound #1 (Lower Leg) Wound Laterality: Right, Anterior Cleanser Soap and Water Discharge Instruction: Gently cleanse wound with antibacterial soap, rinse and pat dry prior to dressing wounds Peri-Wound Care Triamcinolone Acetonide  Cream, 0.1%, 15 (g) tube Discharge Instruction: Apply on reddened areas Topical Primary Dressing Xtrasorb Classic Super Absorbent Dressing, 6x9 (in/in) Secondary Dressing Secured With Compression Wrap Profore Lite LF 3 Multilayer Compression Bandaging System Discharge Instruction: Apply 3 multi-layer wrap as prescribed. CHARISE, LEINBACH ([id number]) Compression Stockings Add-Ons Electronic Signature(s) Signed: 03/25/2021 8:10:37 AM By: Carlene Coria RN Entered By: Carlene Coria on 03/22/2021 08:42:29 [name] [name] ([id number]) -------------------------------------------------------------------------------- Wound Assessment Details Patient Name: [name] [name]. Date of Service: 03/22/2021 8:15 AM Medical Record Number: [id number] Patient Account Number: [id number] Date of Birth/Sex: 1941-04-05 (80 y.o. F) Treating RN: Carlene Coria Primary Care Ousmane Seeman: Sela Hilding Other Clinician: Referring Dao Mearns: Sela Hilding Treating Marcellus Pulliam/Extender: Skipper Cliche in Treatment: 0 Wound Status Wound Number: 2 Primary Venous Leg Ulcer Etiology: Wound Location: Right, Posterior Lower Leg Wound Status: Open Wounding Event: Blister Comorbid Cataracts, Hypertension, Type II Diabetes, Date Acquired: 03/17/2021 History: Rheumatoid Arthritis Weeks Of Treatment: 0 Clustered Wound: No Wound Measurements Length: (cm) 2.2 Width: (cm) 1.9 Depth: (cm) 0.1 Area: (cm) 3.283 Volume: (cm) 0.328 % Reduction in Area: 0% % Reduction in Volume: 0% Wound Description Classification: Full Thickness Without Exposed Support Structures Foul Odor After Cleansing: No Slough/Fibrino No Wound Bed Necrotic Amount: None Present (0%) Treatment Notes Wound #2 (Lower Leg) Wound Laterality: Right, Posterior Cleanser Soap and Water Discharge Instruction: Gently cleanse wound with antibacterial soap, rinse and pat dry prior to dressing wounds Peri-Wound Care Triamcinolone Acetonide Cream, 0.1%, 15 (g)  tube Discharge Instruction: Apply on reddened areas Topical Primary Dressing Xtrasorb Classic Super Absorbent Dressing, 6x9 (in/in) Secondary Dressing Secured With Compression Wrap Profore Lite LF 3 Multilayer Compression Bandaging System Discharge Instruction: Apply 3 multi-layer wrap as prescribed. Compression Stockings Add-Ons Electronic Signature(s) Signed: 03/25/2021 8:10:37 AM By: Carlene Coria RN Entered By: Carlene Coria on 03/22/2021 08:42:36 Honeycutt, [name] ([id number]) JOHNNYE, SANDFORD ([id number]) -------------------------------------------------------------------------------- Wound Assessment Details Patient Name: [name] [name]. Date of Service: 03/22/2021 8:15 AM Medical Record Number: [id number] Patient Account Number: [id number] Date of Birth/Sex: November 25, 1941 (80 y.o. F) Treating RN: Carlene Coria Primary Care Ketty Bitton: Sela Hilding Other Clinician: Referring Eldrick Penick: Sela Hilding Treating Insiya Oshea/Extender: Skipper Cliche in Treatment: 0 Wound Status Wound Number: 3 Primary Etiology: Venous Leg Ulcer Wound Location: Left, Medial Lower Leg Wound Status: Open Wounding Event: Blister Date Acquired: 03/17/2021 Weeks Of Treatment: 0 Clustered Wound: No Wound Measurements Length: (cm) 7.7 Width: (cm) 7.6 Depth: (cm) 0.1 Area: (cm) 45.962 Volume: (cm) 4.596 % Reduction in Area: 0% % Reduction in Volume: 0% Wound Description Classification: Full Thickness Without Exposed Support Structure s Treatment Notes Wound #3 (Lower Leg) Wound Laterality: Left, Medial Cleanser Soap and Water Discharge Instruction: Gently cleanse wound with antibacterial soap, rinse and pat dry prior to dressing wounds Peri-Wound Care Triamcinolone Acetonide Cream, 0.1%, 15 (g) tube Discharge Instruction: Apply on reddened areas Topical Primary Dressing Xtrasorb Classic Super Absorbent Dressing, 6x9 (in/in) Secondary Dressing  Secured With Compression Wrap Profore Lite LF 3  Multilayer Compression Bandaging System Discharge Instruction: Apply 3 multi-layer wrap as prescribed. Compression Stockings Add-Ons Electronic Signature(s) Signed: 03/25/2021 8:10:37 AM By: Carlene Coria RN Entered By: Carlene Coria on 03/22/2021 08:42:15 Harmon, [name] ([id number]) -------------------------------------------------------------------------------- Wound Assessment Details Patient Name: [name] [name]. Date of Service: 03/22/2021 8:15 AM Medical Record Number: [id number] Patient Account Number: [id number] Date of Birth/Sex: 1941-08-20 (80 y.o. F) Treating RN: Carlene Coria Primary Care Kobee Medlen: Sela Hilding Other Clinician: Referring Rozena Fierro: Sela Hilding Treating Lucee Brissett/Extender: Skipper Cliche in Treatment: 0 Wound Status Wound Number: 4 Primary Etiology: Venous Leg Ulcer Wound Location: Left, Lateral Lower Leg Wound Status: Open Wounding Event: Blister Date Acquired: 03/17/2021 Weeks Of Treatment: 0 Clustered Wound: No Wound Measurements Length: (cm) 3.5 Width: (cm) 3.7 Depth: (cm) 0.1 Area: (cm) 10.171 Volume: (cm) 1.017 % Reduction in Area: 0% % Reduction in Volume: 0% Wound Description Classification: Full Thickness Without Exposed Support Structure s Treatment Notes Wound #4 (Lower Leg) Wound Laterality: Left, Lateral Cleanser Soap and Water Discharge Instruction: Gently cleanse wound with antibacterial soap, rinse and pat dry prior to dressing wounds Peri-Wound Care Triamcinolone Acetonide Cream, 0.1%, 15 (g) tube Discharge Instruction: Apply on reddened areas Topical Primary Dressing Xtrasorb Classic Super Absorbent Dressing, 6x9 (in/in) Secondary Dressing Secured With Compression Wrap Profore Lite LF 3 Multilayer Compression Bandaging System Discharge Instruction: Apply 3 multi-layer wrap as prescribed. Compression Stockings Add-Ons Electronic Signature(s) Signed: 03/25/2021 8:10:37 AM By: Carlene Coria RN Entered By: Carlene Coria on 03/22/2021 08:42:15

## 2021-03-25 NOTE — Progress Notes (Addendum)
BERDINA, CHEEVER ([id number]) Visit Report for 03/17/2021 Chief Complaint Document Details Patient Name: [name] [name], [name] [name]. Date of Service: 03/17/2021 9:30 AM Medical Record Number: [id number] Patient Account Number: [id number] Date of Birth/Sex: 10-26-1941 (80 y.o. F) Treating RN: Dolan Amen Primary Care Provider: Sela Hilding Other Clinician: Jeanine Luz Referring Provider: Sela Hilding Treating Provider/Extender: Skipper Cliche in Treatment: 0 Information Obtained from: Patient Chief Complaint Bilateral LE Ulcers Electronic Signature(s) Signed: 03/17/2021 10:42:11 AM By: Worthy Keeler PA-C Entered By: Worthy Keeler on 03/17/2021 10:42:11 Proby, [name] [name] ([id number]) -------------------------------------------------------------------------------- Debridement Details Patient Name: [name] [name]. Date of Service: 03/17/2021 9:30 AM Medical Record Number: [id number] Patient Account Number: [id number] Date of Birth/Sex: [birth year] (80 y.o. F) Treating RN: Dolan Amen Primary Care Provider: Sela Hilding Other Clinician: Jeanine Luz Referring Provider: Sela Hilding Treating Provider/Extender: Skipper Cliche in Treatment: 0 Debridement Performed for Wound #3 Left,Medial Lower Leg Assessment: Performed By: Physician Tommie Sams., PA-C Debridement Type: Chemical/Enzymatic/Mechanical Agent Used: saline/gauze Severity of Tissue Pre Debridement: Fat layer exposed Level of Consciousness (Pre- Awake and Alert procedure): Pre-procedure Verification/Time Out Yes - 10:30 Taken: Start Time: 10:30 Instrument: Other : saline gauze Bleeding: None Response to Treatment: Procedure was tolerated well Level of Consciousness (Post- Awake and Alert procedure): Post Debridement Measurements of Total Wound Length: (cm) 7.7 Width: (cm) 7.6 Depth: (cm) 0.1 Volume: (cm) 4.596 Character of Wound/Ulcer Post Debridement: Stable Severity of Tissue Post  Debridement: Fat layer exposed Post Procedure Diagnosis Same as Pre-procedure Electronic Signature(s) Signed: 03/29/2021 12:45:18 PM By: Charlett Nose RN Signed: 05/17/2021 5:50:12 PM By: Worthy Keeler PA-C Entered By: Georges Mouse, Minus Breeding on 03/29/2021 12:45:18 Harm, [name] [name] ([id number]) -------------------------------------------------------------------------------- Debridement Details Patient Name: [name] [name]. Date of Service: 03/17/2021 9:30 AM Medical Record Number: [id number] Patient Account Number: [id number] Date of Birth/Sex: 1941/04/22 (80 y.o. F) Treating RN: Dolan Amen Primary Care Provider: Sela Hilding Other Clinician: Jeanine Luz Referring Provider: Sela Hilding Treating Provider/Extender: Skipper Cliche in Treatment: 0 Debridement Performed for Wound #1 Right,Anterior Lower Leg Assessment: Performed By: Physician Tommie Sams., PA-C Debridement Type: Chemical/Enzymatic/Mechanical Agent Used: saline/gauze Severity of Tissue Pre Debridement: Fat layer exposed Level of Consciousness (Pre- Awake and Alert procedure): Pre-procedure Verification/Time Out Yes - 10:30 Taken: Start Time: 10:30 Instrument: Other : saline gauze Bleeding: None Response to Treatment: Procedure was tolerated well Level of Consciousness (Post- Awake and Alert procedure): Post Debridement Measurements of Total Wound Length: (cm) 12 Width: (cm) 11 Depth: (cm) 0.1 Volume: (cm) 10.367 Character of Wound/Ulcer Post Debridement: Stable Severity of Tissue Post Debridement: Fat layer exposed Post Procedure Diagnosis Same as Pre-procedure Electronic Signature(s) Signed: 03/29/2021 12:45:49 PM By: Charlett Nose RN Signed: 05/17/2021 5:50:12 PM By: Worthy Keeler PA-C Entered By: Georges Mouse, Minus Breeding on 03/29/2021 12:45:48 [name] [name], [name] [name] ([id number]) -------------------------------------------------------------------------------- HPI  Details Patient Name: [name] [name]. Date of Service: 03/17/2021 9:30 AM Medical Record Number: [id number] Patient Account Number: [id number] Date of Birth/Sex: [birth year] (80 y.o. F) Treating RN: Dolan Amen Primary Care Provider: Sela Hilding Other Clinician: Jeanine Luz Referring Provider: Sela Hilding Treating Provider/Extender: Skipper Cliche in Treatment: 0 History of Present Illness HPI Description: 03/17/2021 upon evaluation today patient presents for initial inspection here in the clinic today concerning issues that she has been having with wounds over the bilateral lower extremities. Unfortunately these appear to be venous in nature and have come up fairly insidiously though they have been going on for several months. The patient does have  a history of chronic venous insufficiency she has not been wearing any compression prior to this happening. With that being said she also has diabetes that she is not taking any current medications. She also has atrial fibrillation as well as being on long-term anticoagulant therapy. With that being said she is scheduled to have a cardioversion on Monday. Her cardiologist feels like that a lot of the swelling in her leg stems from the significant amount of Cardiac dysfunction is related to the atrial fibrillation. They are hoping that the cardioversion will help this if it is successful. They are try to hold off on proceeding with any type of ablation until her legs are healed it sounds like to me. Electronic Signature(s) Signed: 03/17/2021 5:32:40 PM By: Worthy Keeler PA-C Entered By: Worthy Keeler on 03/17/2021 17:32:40 [name] [name], [name] [name] ([id number]) -------------------------------------------------------------------------------- Physical Exam Details Patient Name: [name] [name]. Date of Service: 03/17/2021 9:30 AM Medical Record Number: [id number] Patient Account Number: [id number] Date of Birth/Sex: 1941/11/05 (80 y.o.  F) Treating RN: Dolan Amen Primary Care Provider: Sela Hilding Other Clinician: Jeanine Luz Referring Provider: Sela Hilding Treating Provider/Extender: Jeri Cos Weeks in Treatment: 0 Constitutional sitting or standing blood pressure is within target range for patient.. pulse regular and within target range for patient.[name] respirations regular, non- labored and within target range for patient.[name] temperature within target range for patient.. Well-nourished and well-hydrated in no acute distress. Eyes conjunctiva clear no eyelid edema noted. pupils equal round and reactive to light and accommodation. Ears, Nose, Mouth, and Throat no gross abnormality of ear auricles or external auditory canals. normal hearing noted during conversation. mucus membranes moist. Respiratory normal breathing without difficulty. Cardiovascular 2+ dorsalis pedis/posterior tibialis pulses. 2+ pitting edema of the bilateral lower extremities. Musculoskeletal normal gait and posture. no significant deformity or arthritic changes, no loss or range of motion, no clubbing. Psychiatric this patient is able to make decisions and demonstrates good insight into disease process. Alert and Oriented x 3. pleasant and cooperative. Notes Upon inspection patient's wound bed actually showed signs of being fairly superficial which is great news this bodes well for actually healing hopefully quite nicely. Nonetheless I do believe that the patient is experiencing some discomfort though not too much was painful when I did perform mechanical debridement she unfortunately did have some discomfort. Nonetheless I was able to clear away any of the necrotic debris/blister tissue which I think will aid her in healing much more effectively and quickly. Electronic Signature(s) Signed: 03/17/2021 5:33:27 PM By: Worthy Keeler PA-C Entered By: Worthy Keeler on 03/17/2021 17:33:27 [name] [name], [name] [name]  ([id number]) -------------------------------------------------------------------------------- Physician Orders Details Patient Name: [name] [name]. Date of Service: 03/17/2021 9:30 AM Medical Record Number: [id number] Patient Account Number: [id number] Date of Birth/Sex: 04-May-1941 (80 y.o. F) Treating RN: Carlene Coria Primary Care Provider: Sela Hilding Other Clinician: Jeanine Luz Referring Provider: Sela Hilding Treating Provider/Extender: Skipper Cliche in Treatment: 0 Verbal / Phone Orders: No Diagnosis Coding ICD-10 Coding Code Description I87.2 Venous insufficiency (chronic) (peripheral) L97.822 Non-pressure chronic ulcer of other part of left lower leg with fat layer exposed L97.812 Non-pressure chronic ulcer of other part of right lower leg with fat layer exposed E11.622 Type 2 diabetes mellitus with other skin ulcer I48.0 Paroxysmal atrial fibrillation Z79.01 Long term (current) use of anticoagulants Follow-up Appointments o Return Appointment in 1 week. o Return Appointment in: - Tuesday for Nurse Visit Bathing/ Shower/ Hygiene o May shower with wound dressing protected with water repellent  cover or cast protector. Edema Control - Lymphedema / Segmental Compressive Device / Other Bilateral Lower Extremities o Optional: One layer of unna paste to top of compression wrap (to act as an anchor). o 3 Layer Compression System for Lymphedema. Wound Treatment Wound #1 - Lower Leg Wound Laterality: Right, Anterior Cleanser: Soap and Water 2 x Per Week/15 Days Discharge Instructions: Gently cleanse wound with antibacterial soap, rinse and pat dry prior to dressing wounds Peri-Wound Care: Triamcinolone Acetonide Cream, 0.1%, 15 (g) tube 2 x Per Week/15 Days Discharge Instructions: Apply on reddened areas Primary Dressing: Xtrasorb Classic Super Absorbent Dressing, 6x9 (in/in) 2 x Per Week/15 Days Compression Wrap: Profore Lite LF 3 Multilayer  Compression Bandaging System 2 x Per Week/15 Days Discharge Instructions: Apply 3 multi-layer wrap as prescribed. Wound #2 - Lower Leg Wound Laterality: Right, Posterior Cleanser: Soap and Water 2 x Per Week/15 Days Discharge Instructions: Gently cleanse wound with antibacterial soap, rinse and pat dry prior to dressing wounds Peri-Wound Care: Triamcinolone Acetonide Cream, 0.1%, 15 (g) tube 2 x Per Week/15 Days Discharge Instructions: Apply on reddened areas Primary Dressing: Xtrasorb Classic Super Absorbent Dressing, 6x9 (in/in) 2 x Per Week/15 Days Compression Wrap: Profore Lite LF 3 Multilayer Compression Bandaging System 2 x Per Week/15 Days Discharge Instructions: Apply 3 multi-layer wrap as prescribed. Wound #3 - Lower Leg Wound Laterality: Left, Medial Cleanser: Soap and Water 2 x Per Week/15 Days Discharge Instructions: Gently cleanse wound with antibacterial soap, rinse and pat dry prior to dressing wounds Peri-Wound Care: Triamcinolone Acetonide Cream, 0.1%, 15 (g) tube 2 x Per Week/15 Days Discharge Instructions: Apply on reddened areas PREESHA, [name] [name] ([id number]) Primary Dressing: Lauraine Rinne Classic Super Absorbent Dressing, 6x9 (in/in) 2 x Per Week/15 Days Compression Wrap: Profore Lite LF 3 Multilayer Compression Bandaging System 2 x Per Week/15 Days Discharge Instructions: Apply 3 multi-layer wrap as prescribed. Wound #4 - Lower Leg Wound Laterality: Left, Lateral Cleanser: Soap and Water 2 x Per Week/15 Days Discharge Instructions: Gently cleanse wound with antibacterial soap, rinse and pat dry prior to dressing wounds Peri-Wound Care: Triamcinolone Acetonide Cream, 0.1%, 15 (g) tube 2 x Per Week/15 Days Discharge Instructions: Apply on reddened areas Primary Dressing: Xtrasorb Classic Super Absorbent Dressing, 6x9 (in/in) 2 x Per Week/15 Days Compression Wrap: Profore Lite LF 3 Multilayer Compression Bandaging System 2 x Per Week/15 Days Discharge Instructions: Apply 3  multi-layer wrap as prescribed. Electronic Signature(s) Signed: 03/17/2021 5:44:32 PM By: Worthy Keeler PA-C Signed: 03/25/2021 8:11:40 AM By: Carlene Coria RN Entered By: Carlene Coria on 03/17/2021 11:07:57 [name] [name], [name] [name] ([id number]) -------------------------------------------------------------------------------- Problem List Details Patient Name: [name] [name]. Date of Service: 03/17/2021 9:30 AM Medical Record Number: [id number] Patient Account Number: [id number] Date of Birth/Sex: 23-Apr-1941 (80 y.o. F) Treating RN: Dolan Amen Primary Care Provider: Sela Hilding Other Clinician: Jeanine Luz Referring Provider: Sela Hilding Treating Provider/Extender: Skipper Cliche in Treatment: 0 Active Problems ICD-10 Encounter Code Description Active Date MDM Diagnosis I87.2 Venous insufficiency (chronic) (peripheral) 03/17/2021 No Yes L97.822 Non-pressure chronic ulcer of other part of left lower leg with fat layer 03/17/2021 No Yes exposed L97.812 Non-pressure chronic ulcer of other part of right lower leg with fat layer 03/17/2021 No Yes exposed E11.622 Type 2 diabetes mellitus with other skin ulcer 03/17/2021 No Yes I48.0 Paroxysmal atrial fibrillation 03/17/2021 No Yes Z79.01 Long term (current) use of anticoagulants 03/17/2021 No Yes Inactive Problems Resolved Problems Electronic Signature(s) Signed: 03/17/2021 10:41:43 AM By: Worthy Keeler PA-C Entered By: Melburn Hake,  Aundria Bitterman on 03/17/2021 10:41:43 [name] [name], [name] [name] ([id number]) -------------------------------------------------------------------------------- Progress Note Details Patient Name: [name] [name], [name] [name]. Date of Service: 03/17/2021 9:30 AM Medical Record Number: [id number] Patient Account Number: [id number] Date of Birth/Sex: 31-Aug-1941 (80 y.o. F) Treating RN: Dolan Amen Primary Care Provider: Sela Hilding Other Clinician: Jeanine Luz Referring Provider: Sela Hilding Treating Provider/Extender:  Skipper Cliche in Treatment: 0 Subjective Chief Complaint Information obtained from Patient Bilateral LE Ulcers History of Present Illness (HPI) 03/17/2021 upon evaluation today patient presents for initial inspection here in the clinic today concerning issues that she has been having with wounds over the bilateral lower extremities. Unfortunately these appear to be venous in nature and have come up fairly insidiously though they have been going on for several months. The patient does have a history of chronic venous insufficiency she has not been wearing any compression prior to this happening. With that being said she also has diabetes that she is not taking any current medications. She also has atrial fibrillation as well as being on long-term anticoagulant therapy. With that being said she is scheduled to have a cardioversion on Monday. Her cardiologist feels like that a lot of the swelling in her leg stems from the significant amount of Cardiac dysfunction is related to the atrial fibrillation. They are hoping that the cardioversion will help this if it is successful. They are try to hold off on proceeding with any type of ablation until her legs are healed it sounds like to me. Patient History Information obtained from Patient. Allergies diphtheria, pertussis, tetanus vaccine, Cardizem (Severity: Mild, Reaction: rash) Social History Never smoker, Marital Status - Married, Alcohol Use - Never, Drug Use - No History, Caffeine Use - Daily - soda. Medical History Eyes Patient has history of Cataracts - 2 years Denies history of Glaucoma, Optic Neuritis Ear/Nose/Mouth/Throat Denies history of Chronic sinus problems/congestion, Middle ear problems Hematologic/Lymphatic Denies history of Anemia, Hemophilia, Human Immunodeficiency Virus, Lymphedema, Sickle Cell Disease Respiratory Denies history of Aspiration, Asthma, Chronic Obstructive Pulmonary Disease (COPD), Pneumothorax, Sleep  Apnea, Tuberculosis Cardiovascular Patient has history of Hypertension - 11 years ago Gastrointestinal Denies history of Cirrhosis , Colitis, Crohn s, Hepatitis A, Hepatitis B, Hepatitis C Endocrine Patient has history of Type II Diabetes Denies history of Type I Diabetes Genitourinary Denies history of End Stage Renal Disease Immunological Denies history of Lupus Erythematosus, Raynaud s, Scleroderma Integumentary (Skin) Denies history of History of Burn, History of pressure wounds Musculoskeletal Patient has history of Rheumatoid Arthritis Denies history of Gout, Osteoarthritis, Osteomyelitis Neurologic Denies history of Dementia, Neuropathy, Quadriplegia, Paraplegia, Seizure Disorder Oncologic Denies history of Received Chemotherapy, Received Radiation Psychiatric Denies history of Anorexia/bulimia, Confinement Anxiety Patient is treated with Controlled Diet. Blood sugar is not tested. 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 ([id number]) Review of Systems (ROS) Constitutional Symptoms (General Health) Complains or has symptoms of Fatigue. Denies complaints or symptoms of Fever, Chills, Marked Weight Change. Eyes Denies complaints or symptoms of Dry Eyes, Vision Changes, Glasses / Contacts. Ear/Nose/Mouth/Throat Denies complaints or symptoms of Difficult clearing ears, Sinusitis. Hematologic/Lymphatic Denies complaints or symptoms of Bleeding / Clotting Disorders, Human Immunodeficiency Virus. Respiratory Denies complaints or symptoms of Chronic or frequent coughs, Shortness of Breath. Cardiovascular Denies complaints or symptoms of Chest pain, LE edema. Gastrointestinal Denies complaints or symptoms of Frequent diarrhea, Nausea, Vomiting. Endocrine Denies complaints or symptoms of Hepatitis, Thyroid disease, Polydypsia (Excessive  Thirst). Genitourinary Denies complaints or  symptoms of Kidney failure/ Dialysis, Incontinence/dribbling. Immunological Denies complaints or symptoms of Hives, Itching. Integumentary (Skin) Complains or has symptoms of Wounds, Swelling. Denies complaints or symptoms of Bleeding or bruising tendency, Breakdown. Musculoskeletal Denies complaints or symptoms of Muscle Pain, Muscle Weakness. Neurologic Denies complaints or symptoms of Numbness/parasthesias, Focal/Weakness. 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 Objective Constitutional sitting or standing blood pressure is within target range for patient.. pulse regular and within target range for patient.[name] respirations regular, non- labored and within target range for patient.[name] temperature within target range for patient.. Well-nourished and well-hydrated in no acute distress. Vitals Time Taken: 9:55 AM, Height: 66 in, Source: Stated, Weight: 242 lbs, Source: Measured, BMI: 39.1, Temperature: 97.9 F, Pulse: 92 bpm, Respiratory Rate: 18 breaths/min, Blood Pressure: 97/66 mmHg. Eyes conjunctiva clear no eyelid edema noted. pupils equal round and reactive to light and accommodation. Ears, Nose, Mouth, and Throat no gross abnormality of ear auricles or external auditory canals. normal hearing noted during conversation. mucus membranes moist. Respiratory normal breathing without difficulty. Cardiovascular 2+ dorsalis pedis/posterior tibialis pulses. 2+ pitting edema of the bilateral lower extremities. Musculoskeletal normal gait and posture. no significant deformity or arthritic changes, no loss or range of motion, no clubbing. Psychiatric this patient is able to make decisions and demonstrates good insight into disease process. Alert and Oriented x 3. pleasant and cooperative. General Notes: Upon inspection patient's wound bed actually showed signs of being fairly  superficial which is great news this bodes well for actually healing hopefully quite nicely. Nonetheless I do believe that the patient is experiencing some discomfort though not too much was painful when I did perform mechanical debridement she unfortunately did have some discomfort. Nonetheless I was able to clear away any of the necrotic debris/blister tissue which I think will aid her in healing much more effectively and quickly. Integumentary (Hair, Skin) [name] [name], [name] K. ([id number]) Wound #1 status is Open. Original cause of wound was Blister. The date acquired was: 03/17/2021. The wound is located on the Right,Anterior Lower Leg. The wound measures 12cm length x 11cm width x 0.1cm depth; 103.673cm^2 area and 10.367cm^3 volume. There is Fat Layer (Subcutaneous Tissue) exposed. There is no tunneling or undermining noted. There is a large amount of serous drainage noted. There is large (67-100%) red granulation within the wound bed. There is no necrotic tissue within the wound bed. Wound #2 status is Open. Original cause of wound was Blister. The date acquired was: 03/17/2021. The wound is located on the Right,Posterior Lower Leg. The wound measures 2.2cm length x 1.9cm width x 0.1cm depth; 3.283cm^2 area and 0.328cm^3 volume. There is no tunneling or undermining noted. There is no necrotic tissue within the wound bed. Wound #3 status is Open. Original cause of wound was Blister. The date acquired was: 03/17/2021. The wound is located on the Left,Medial Lower Leg. The wound measures 7.7cm length x 7.6cm width x 0.1cm depth; 45.962cm^2 area and 4.596cm^3 volume. There is Fat Layer (Subcutaneous Tissue) exposed. There is no tunneling or undermining noted. There is a large amount of serous drainage noted. There is large (67-100%) red granulation within the wound bed. There is no necrotic tissue within the wound bed. Wound #4 status is Open. Original cause of wound was Blister. The date acquired was:  03/17/2021. The wound is located on the Left,Lateral Lower Leg. The wound measures 3.5cm length x 3.7cm width x 0.1cm depth; 10.171cm^2 area and 1.017cm^3 volume. There is Fat Layer (Subcutaneous Tissue)  exposed. There is no tunneling or undermining noted. There is a large amount of serous drainage noted. There is large (67-100%) red granulation within the wound bed. There is no necrotic tissue within the wound bed. 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 There was a Three Layer Compression Therapy Procedure by Carlene Coria, RN. Post procedure Diagnosis Wound #1: Same as Pre-Procedure Notes: L 0.78, R 0.76. Wound #2 Pre-procedure diagnosis of Wound #2 is a Venous Leg Ulcer located on the Right,Posterior Lower Leg . There was a Three Layer Compression Therapy Procedure by Carlene Coria, RN. Post procedure Diagnosis Wound #2: Same as Pre-Procedure Notes: L 0.78, R 0.76. Wound #3 Pre-procedure diagnosis of Wound #3 is a Venous Leg Ulcer located on the Left,Medial Lower Leg . There was a Three Layer Compression Therapy Procedure by Carlene Coria, RN. Post procedure Diagnosis Wound #3: Same as Pre-Procedure Notes: L 0.78, R 0.76. Wound #4 Pre-procedure diagnosis of Wound #4 is a Venous Leg Ulcer located on the Left,Lateral Lower Leg . There was a Three Layer Compression Therapy Procedure by Carlene Coria, RN. Post procedure Diagnosis Wound #4: Same as Pre-Procedure Notes: L 0.78, R 0.76. Plan Follow-up Appointments: Return Appointment in 1 week. Return  Appointment in: - Tuesday for Nurse Visit Bathing/ Shower/ Hygiene: NANNA, ERTLE ([id number]) May shower with wound dressing protected with water repellent cover or cast protector. Edema Control - Lymphedema / Segmental Compressive Device / Other: Optional: One layer of unna paste to top of compression wrap (to act as an anchor). 3 Layer Compression System for Lymphedema. WOUND #1: - Lower Leg Wound Laterality: Right, Anterior Cleanser: Soap and Water 2 x Per Week/15 Days Discharge Instructions: Gently cleanse wound with antibacterial soap, rinse and pat dry prior to dressing wounds Peri-Wound Care: Triamcinolone Acetonide Cream, 0.1%, 15 (g) tube 2 x Per Week/15 Days Discharge Instructions: Apply on reddened areas Primary Dressing: Xtrasorb Classic Super Absorbent Dressing, 6x9 (in/in) 2 x Per Week/15 Days Compression Wrap: Profore Lite LF 3 Multilayer Compression Bandaging System 2 x Per Week/15 Days Discharge Instructions: Apply 3 multi-layer wrap as prescribed. WOUND #2: - Lower Leg Wound Laterality: Right, Posterior Cleanser: Soap and Water 2 x Per Week/15 Days Discharge Instructions: Gently cleanse wound with antibacterial soap, rinse and pat dry prior to dressing wounds Peri-Wound Care: Triamcinolone Acetonide Cream, 0.1%, 15 (g) tube 2 x Per Week/15 Days Discharge Instructions: Apply on reddened areas Primary Dressing: Xtrasorb Classic Super Absorbent Dressing, 6x9 (in/in) 2 x Per Week/15 Days Compression Wrap: Profore Lite LF 3 Multilayer Compression Bandaging System 2 x Per Week/15 Days Discharge Instructions: Apply 3 multi-layer wrap as prescribed. WOUND #3: - Lower Leg Wound Laterality: Left, Medial Cleanser: Soap and Water 2 x Per Week/15 Days Discharge Instructions: Gently cleanse wound with antibacterial soap, rinse and pat dry prior to dressing wounds Peri-Wound Care: Triamcinolone Acetonide Cream, 0.1%, 15 (g) tube 2 x Per Week/15 Days Discharge Instructions: Apply on  reddened areas Primary Dressing: Xtrasorb Classic Super Absorbent Dressing, 6x9 (in/in) 2 x Per Week/15 Days Compression Wrap: Profore Lite LF 3 Multilayer Compression Bandaging System 2 x Per Week/15  Days Discharge Instructions: Apply 3 multi-layer wrap as prescribed. WOUND #4: - Lower Leg Wound Laterality: Left, Lateral Cleanser: Soap and Water 2 x Per Week/15 Days Discharge Instructions: Gently cleanse wound with antibacterial soap, rinse and pat dry prior to dressing wounds Peri-Wound Care: Triamcinolone Acetonide Cream, 0.1%, 15 (g) tube 2 x Per Week/15 Days Discharge Instructions: Apply on reddened areas Primary Dressing: Xtrasorb Classic Super Absorbent Dressing, 6x9 (in/in) 2 x Per Week/15 Days Compression Wrap: Profore Lite LF 3 Multilayer Compression Bandaging System 2 x Per Week/15 Days Discharge Instructions: Apply 3 multi-layer wrap as prescribed. 1. Would recommend at this time that we go ahead and initiate treatment with a initiation of triamcinolone to the legs not over the wounds followed by XtraSorb to help catch the excessive drainage. I think this is good to be likely a good option for her to start with. Once we get some of the drainage under control I think she will start to feel much better especially from the standpoint of also decreasing the size of her legs and how things are doing in that regard. 2. I am also can recommend that we use a 3 layer compression wrap to start with a try to get some of this edema down. 3. I am also can recommend elevation I discussed that with her today I think that is of utmost importance that she can to see things really improved here that we will speeded up quite a bit. We will see patient back for reevaluation in 1 week here in the clinic. If anything worsens or changes patient will contact our office for additional recommendations. Electronic Signature(s) Signed: 03/17/2021 5:34:31 PM By: Worthy Keeler PA-C Entered By: Worthy Keeler  on 03/17/2021 17:34:31 [place], [name] [name] ([id number]) -------------------------------------------------------------------------------- ROS/PFSH Details Patient Name: [name] [name]. Date of Service: 03/17/2021 9:30 AM Medical Record Number: [id number] Patient Account Number: [id number] Date of Birth/Sex: 1941/11/14 (80 y.o. F) Treating RN: Carlene Coria Primary Care Provider: Sela Hilding Other Clinician: Jeanine Luz Referring Provider: Sela Hilding Treating Provider/Extender: Skipper Cliche in Treatment: 0 Information Obtained From Patient Constitutional Symptoms (General Health) Complaints and Symptoms: Positive for: Fatigue Negative for: Fever; Chills; Marked Weight Change Eyes Complaints and Symptoms: Negative for: Dry Eyes; Vision Changes; Glasses / Contacts Medical History: Positive for: Cataracts - 2 years Negative for: Glaucoma; Optic Neuritis Ear/Nose/Mouth/Throat Complaints and Symptoms: Negative for: Difficult clearing ears; Sinusitis Medical History: Negative for: Chronic sinus problems/congestion; Middle ear problems Hematologic/Lymphatic Complaints and Symptoms: Negative for: Bleeding / Clotting Disorders; Human Immunodeficiency Virus Medical History: Negative for: Anemia; Hemophilia; Human Immunodeficiency Virus; Lymphedema; Sickle Cell Disease Respiratory Complaints and Symptoms: Negative for: Chronic or frequent coughs; Shortness of Breath Medical History: Negative for: Aspiration; Asthma; Chronic Obstructive Pulmonary Disease (COPD); Pneumothorax; Sleep Apnea; Tuberculosis Cardiovascular Complaints and Symptoms: Negative for: Chest pain; LE edema Medical History: Positive for: Hypertension - 11 years ago Past Medical History Notes: Afib Gastrointestinal Complaints and Symptoms: Negative for: Frequent diarrhea; Nausea; Vomiting Medical History: Negative for: Cirrhosis ; Colitis; Crohnos; Hepatitis A; Hepatitis B; Hepatitis  C Endocrine Hewett, Eleshia K. 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  Immunological Complaints and Symptoms: Negative for: Hives; Itching Medical History: Negative for: Lupus Erythematosus; Raynaudos; Scleroderma Integumentary (Skin) Complaints and Symptoms: Positive for: Wounds; Swelling Negative for: Bleeding or bruising tendency; Breakdown Medical History: Negative for: History of Burn; History of pressure wounds Musculoskeletal Complaints and Symptoms: Negative for: Muscle Pain; Muscle Weakness Medical History: Positive for: Rheumatoid Arthritis Negative for: Gout; Osteoarthritis; Osteomyelitis Neurologic Complaints and Symptoms: Negative for: Numbness/parasthesias; Focal/Weakness Medical History: Negative for: Dementia; Neuropathy; Quadriplegia; Paraplegia; Seizure Disorder Psychiatric Complaints and Symptoms: Negative for: Anxiety; Claustrophobia Medical History: Negative for: Anorexia/bulimia; Confinement Anxiety Oncologic Complaints and Symptoms: Review of 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 Medical History: Negative for: Received Chemotherapy; Received Radiation [name] [name], [name] [name] ([id number]) [name] [name] Eyes: Cataracts Immunizations Pneumococcal Vaccine: Received Pneumococcal Vaccination: Yes Implantable Devices None Family and Social History Never smoker; Marital Status - Married; Alcohol Use: Never; Drug Use: No History; Caffeine Use: Daily - soda; Financial  Concerns: No; Food, Clothing or Shelter Needs: No; Support System Lacking: No; Transportation Concerns: No Electronic Signature(s) Signed: 03/17/2021 5:44:32 PM By: Worthy Keeler PA-C Signed: 03/25/2021 8:11:40 AM By: Carlene Coria RN Entered By: Carlene Coria on 03/17/2021 10:09:18 [name] [name] ([id number]) -------------------------------------------------------------------------------- SuperBill Details Patient Name: [name] [name]. Date of Service: 03/17/2021 Medical Record Number: [id number] Patient Account Number: [id number] Date of Birth/Sex: Apr 10, 1941 (80 y.o. F) Treating RN: Carlene Coria Primary Care Provider: Sela Hilding Other Clinician: Jeanine Luz Referring Provider: Sela Hilding Treating Provider/Extender: Skipper Cliche in Treatment: 0 Diagnosis Coding ICD-10 Codes Code Description I87.2 Venous insufficiency (chronic) (peripheral) L97.822 Non-pressure chronic ulcer of other part of left lower leg with fat layer exposed L97.812 Non-pressure chronic ulcer of other part of right lower leg with fat layer exposed E11.622 Type 2 diabetes mellitus with other skin ulcer I48.0 Paroxysmal atrial fibrillation Z79.01 Long term (current) use of anticoagulants Facility Procedures CPT4: Description Modifier Quantity Code [id number] - WOUND CARE VISIT-LEV 4 EST PT 1 CPT4: [id number] BILATERAL: Application of multi-layer venous compression system; leg (below knee), including 1 ankle and foot. Physician Procedures CPT4 Code: 2248250 Description: WC PHYS LEVEL 3 o NEW PT Modifier: Quantity: 1 CPT4 Code: Description: ICD-10 Diagnosis Description I87.2 Venous insufficiency (chronic) (peripheral) L97.822 Non-pressure chronic ulcer of other part of left lower leg with fat l L97.812 Non-pressure chronic ulcer of other part of right lower leg with fat E11.622  Type 2 diabetes mellitus with other skin ulcer Modifier: ayer exposed layer exposed Quantity: Electronic  Signature(s) Signed: 03/17/2021 5:35:32 PM By: Worthy Keeler PA-C Entered By: Worthy Keeler on 03/17/2021 17:35:32

## 2021-03-25 NOTE — Progress Notes (Signed)
SHERALEE, QAZI ([id number]) Visit Report for 03/17/2021 Abuse/Suicide Risk Screen Details Patient Name: [name] [name], [name] [name]. Date of Service: 03/17/2021 9:30 AM Medical Record Number: [id number] Patient Account Number: [id number] Date of Birth/Sex: [birth year] (80 y.o. F) Treating RN: Carlene Coria Primary Care Aizlyn Schifano: Sela Hilding Other Clinician: Jeanine Luz Referring Tanekia Ryans: Sela Hilding Treating Lucy Woolever/Extender: Skipper Cliche in Treatment: 0 Abuse/Suicide Risk Screen Items Answer ABUSE RISK SCREEN: Has anyone close to you tried to hurt or harm you recentlyo No Do you feel uncomfortable with anyone in your familyo No Has anyone forced you do things that you didnot want to doo No Electronic Signature(s) Signed: 03/25/2021 8:11:40 AM By: Carlene Coria RN Entered By: Carlene Coria on 03/17/2021 10:09:23 [name] [name] ([id number]) -------------------------------------------------------------------------------- Activities of Daily Living Details Patient Name: [name] [name]. Date of Service: 03/17/2021 9:30 AM Medical Record Number: [id number] Patient Account Number: [id number] Date of Birth/Sex: 1941-12-05 (80 y.o. F) Treating RN: Carlene Coria Primary Care Wallice Granville: Sela Hilding Other Clinician: Jeanine Luz Referring Thomasa Heidler: Sela Hilding Treating Doye Montilla/Extender: Skipper Cliche in Treatment: 0 Activities of Daily Living Items Answer Activities of Daily Living (Please select one for each item) Drive Automobile Completely Able Take Medications Completely Able Use Telephone Completely Able Care for Appearance Completely Able Use Toilet Completely Able Bath / Shower Completely Able Dress Self Completely Able Feed Self Completely Able Walk Completely Able Get In / Out Bed Completely Able Housework Completely Able Prepare Meals Completely Able Handle Money Completely Able Shop for Self Completely Able Electronic Signature(s) Signed: 03/25/2021  8:11:40 AM By: Carlene Coria RN Entered By: Carlene Coria on 03/17/2021 10:10:05 [name] [name] ([id number]) -------------------------------------------------------------------------------- Education Screening Details Patient Name: [name] [name]. Date of Service: 03/17/2021 9:30 AM Medical Record Number: [id number] Patient Account Number: [id number] Date of Birth/Sex: 07/09/1941 (80 y.o. F) Treating RN: Carlene Coria Primary Care Elani Delph: Sela Hilding Other Clinician: Jeanine Luz Referring Amiylah Anastos: Sela Hilding Treating Ettore Trebilcock/Extender: Skipper Cliche in Treatment: 0 Learning Preferences/Education Level/Primary Language Learning Preference: Explanation, Demonstration Highest Education Level: College or Above Preferred Language: English Cognitive Barrier Language Barrier: No Translator Needed: No Memory Deficit: No Emotional Barrier: No Cultural/Religious Beliefs Affecting Medical Care: No Physical Barrier Impaired Vision: Yes Glasses Impaired Hearing: No Decreased Hand dexterity: No Knowledge/Comprehension Knowledge Level: High Comprehension Level: High Ability to understand written instructions: High Ability to understand verbal instructions: High Motivation Anxiety Level: Calm Cooperation: Cooperative Education Importance: Acknowledges Need Interest in Health Problems: Asks Questions Perception: Coherent Willingness to Engage in Self-Management High Activities: Readiness to Engage in Self-Management High Activities: Electronic Signature(s) Signed: 03/25/2021 8:11:40 AM By: Carlene Coria RN Entered By: Carlene Coria on 03/17/2021 10:11:04 [name] [name] ([id number]) -------------------------------------------------------------------------------- Fall Risk Assessment Details Patient Name: [name] [name]. Date of Service: 03/17/2021 9:30 AM Medical Record Number: [id number] Patient Account Number: [id number] Date of Birth/Sex: 1941-03-27 (80 y.o.  F) Treating RN: Carlene Coria Primary Care Heavenly Christine: Sela Hilding Other Clinician: Jeanine Luz Referring Kimiyo Carmicheal: Sela Hilding Treating Pier Bosher/Extender: Skipper Cliche in Treatment: 0 Fall Risk Assessment Items Have you had 2 or more falls in the last 12 monthso 0 No Have you had any fall that resulted in injury in the last 12 monthso 0 No FALLS RISK SCREEN History of falling - immediate or within 3 months 0 No Secondary diagnosis (Do you have 2 or more medical diagnoseso) 0 No Ambulatory aid None/bed rest/wheelchair/nurse 0 No Crutches/cane/walker 0 No Furniture 0 No Intravenous therapy Access/Saline/Heparin Lock 0 No Gait/Transferring Normal/ bed rest/ wheelchair  0 No Weak (short steps with or without shuffle, stooped but able to lift head while walking, may 0 No seek support from furniture) Impaired (short steps with shuffle, may have difficulty arising from chair, head down, impaired 0 No balance) Mental Status Oriented to own ability 0 Yes Electronic Signature(s) Signed: 03/25/2021 8:11:40 AM By: Carlene Coria RN Entered By: Carlene Coria on 03/17/2021 10:11:36 [place], [name] ([id number]) -------------------------------------------------------------------------------- Foot Assessment Details Patient Name: [name] [name]. Date of Service: 03/17/2021 9:30 AM Medical Record Number: [id number] Patient Account Number: [id number] Date of Birth/Sex: [birth year] (80 y.o. F) Treating RN: Carlene Coria Primary Care Jaella Weinert: Sela Hilding Other Clinician: Jeanine Luz Referring Courtlynn Holloman: Sela Hilding Treating Francisco Eyerly/Extender: Skipper Cliche in Treatment: 0 Foot Assessment Items Site Locations + = Sensation present, - = Sensation absent, C = Callus, U = Ulcer R = Redness, W = Warmth, M = Maceration, PU = Pre-ulcerative lesion F = Fissure, S = Swelling, D = Dryness Assessment Right: Left: Other Deformity: No No Prior Foot Ulcer: No No Prior  Amputation: No No Charcot Joint: No No Ambulatory Status: Ambulatory Without Help Gait: Steady Electronic Signature(s) Signed: 03/25/2021 8:11:40 AM By: Carlene Coria RN Entered By: Carlene Coria on 03/17/2021 10:16:19 [place], [name] ([id number]) -------------------------------------------------------------------------------- Nutrition Risk Screening Details Patient Name: [name] [name]. Date of Service: 03/17/2021 9:30 AM Medical Record Number: [id number] Patient Account Number: [id number] Date of Birth/Sex: [birth year] (80 y.o. F) Treating RN: Carlene Coria Primary Care Treyshawn Muldrew: Sela Hilding Other Clinician: Jeanine Luz Referring Raj Landress: Sela Hilding Treating Aliah Eriksson/Extender: Skipper Cliche in Treatment: 0 Height (in): 66 Weight (lbs): 242 Body Mass Index (BMI): 39.1 Nutrition Risk Screening Items Score Screening NUTRITION RISK SCREEN: I have an illness or condition that made me change the kind and/or amount of food I eat 0 No I eat fewer than two meals per day 0 No I eat few fruits and vegetables, or milk products 0 No I have three or more drinks of beer, liquor or wine almost every day 0 No I have tooth or mouth problems that make it hard for me to eat 0 No I don't always have enough money to buy the food I need 0 No I eat alone most of the time 0 No I take three or more different prescribed or over-the-counter drugs a day 1 Yes Without wanting to, I have lost or gained 10 pounds in the last six months 2 Yes I am not always physically able to shop, cook and/or feed myself 0 No Nutrition Protocols Good Risk Protocol 0 No interventions needed Moderate Risk Protocol High Risk Proctocol Risk Level: Moderate Risk Score: 3 Electronic Signature(s) Signed: 03/25/2021 8:11:40 AM By: Carlene Coria RN Entered By: Carlene Coria on 03/17/2021 10:12:15

## 2021-03-28 ENCOUNTER — Other Ambulatory Visit: Payer: Self-pay

## 2021-03-28 ENCOUNTER — Encounter (HOSPITAL_COMMUNITY): Payer: Self-pay | Admitting: Nurse Practitioner

## 2021-03-28 ENCOUNTER — Ambulatory Visit (HOSPITAL_COMMUNITY)
Admission: RE | Admit: 2021-03-28 | Discharge: 2021-03-28 | Disposition: A | Discharge status: Home / Self Care | Payer: Medicare Other | Source: Ambulatory Visit | Attending: Nurse Practitioner | Admitting: Nurse Practitioner

## 2021-03-28 ENCOUNTER — Ambulatory Visit (HOSPITAL_BASED_OUTPATIENT_CLINIC_OR_DEPARTMENT_OTHER)
Admission: RE | Admit: 2021-03-28 | Discharge: 2021-03-28 | Disposition: A | Discharge status: Home / Self Care | Payer: Medicare Other | Source: Ambulatory Visit | Attending: Nurse Practitioner | Admitting: Nurse Practitioner

## 2021-03-28 VITALS — BP 130/82 | HR 52 | Ht 66.0 in | Wt 244.0 lb

## 2021-03-28 DIAGNOSIS — E119 Type 2 diabetes mellitus without complications: Secondary | ICD-10-CM | POA: Insufficient documentation

## 2021-03-28 DIAGNOSIS — E785 Hyperlipidemia, unspecified: Secondary | ICD-10-CM | POA: Diagnosis not present

## 2021-03-28 DIAGNOSIS — I119 Hypertensive heart disease without heart failure: Secondary | ICD-10-CM | POA: Diagnosis not present

## 2021-03-28 DIAGNOSIS — I4891 Unspecified atrial fibrillation: Secondary | ICD-10-CM | POA: Insufficient documentation

## 2021-03-28 DIAGNOSIS — I4819 Other persistent atrial fibrillation: Secondary | ICD-10-CM

## 2021-03-28 DIAGNOSIS — R600 Localized edema: Secondary | ICD-10-CM | POA: Diagnosis not present

## 2021-03-28 DIAGNOSIS — K219 Gastro-esophageal reflux disease without esophagitis: Secondary | ICD-10-CM | POA: Diagnosis not present

## 2021-03-28 DIAGNOSIS — I2510 Atherosclerotic heart disease of native coronary artery without angina pectoris: Secondary | ICD-10-CM | POA: Diagnosis not present

## 2021-03-28 DIAGNOSIS — F17200 Nicotine dependence, unspecified, uncomplicated: Secondary | ICD-10-CM | POA: Diagnosis not present

## 2021-03-28 DIAGNOSIS — D6869 Other thrombophilia: Secondary | ICD-10-CM

## 2021-03-28 DIAGNOSIS — I252 Old myocardial infarction: Secondary | ICD-10-CM | POA: Diagnosis not present

## 2021-03-28 DIAGNOSIS — Z7901 Long term (current) use of anticoagulants: Secondary | ICD-10-CM | POA: Insufficient documentation

## 2021-03-28 DIAGNOSIS — I080 Rheumatic disorders of both mitral and aortic valves: Secondary | ICD-10-CM | POA: Diagnosis not present

## 2021-03-28 LAB — BASIC METABOLIC PANEL
Anion gap: 9 (ref 5–15)
BUN: 17 mg/dL (ref 8–23)
CO2: 29 mmol/L (ref 22–32)
Calcium: 9.1 mg/dL (ref 8.9–10.3)
Chloride: 102 mmol/L (ref 98–111)
Creatinine, Ser: 1.1 mg/dL — ABNORMAL HIGH (ref 0.44–1.00)
GFR, Estimated: 52 mL/min — ABNORMAL LOW (ref >60)
Glucose, Bld: 127 mg/dL — ABNORMAL HIGH (ref 70–99)
Potassium: 3.6 mmol/L (ref 3.5–5.1)
Sodium: 140 mmol/L (ref 135–145)

## 2021-03-28 NOTE — Progress Notes (Signed)
  Echocardiogram 2D Echocardiogram with strain has been performed.  [name] [name] M 03/28/2021, 2:53 PM

## 2021-03-28 NOTE — Progress Notes (Signed)
Primary Care Physician: Glenis Smoker, MD Referring Physician:Acharya,Gayatri , MD   [name] is a 80 y.o. female with a h/o HTN, DM, that presented tp [facility] ED with chest pain and found to have new onset afib. She was admitted and had an echo that was unremarkable and a Left heart cath that showed mimimal nonobstructive CAD. She was discharfed in afib on diltiazem 120 mg qd and eliquis 5 mg bid for a CHA2DS2VASc score of 5.  In the afib clinic she remains in afib with RVR. Overall she feels ok.No  further chest pain. She does not drink alcohol, drinks several diet Pepsi's a day, no tobacco and denies snoring.  She does have intermittent LLE, Rt > Lt and was d/c on lasix as needed.   F/u in afib clinic 7/7. She is now approaching the full 3 week requirement of DOAC for  cardioversion. She  has chronic lower extremity edema which preceded afib but has required more lasix since in afib. She is rate controlled.   F/u in afib clinic 7/23. Unfortunately, DCCV was cancelled as pt started having hematuria the day before so it was cancelled until it could be determined source of bleeding as her U/A did not show any infection. She took an round of antibiotics and has not seen any more bleeding. DCCV will be rescheduled.She continues in rate controlled afib.   F/u in afib clinic, 09/14/20. It was discovered that hematuria was coming from  a kidney stone that was surgically removed 9/4. The hematuria stopped about a week after that. She is now back in  the afib clinic to have the cardioversion rescheduled. She had a delayed eliquis dose by 8 hours on 9/14 so will be scheduled 3 weeks after that date.   F/u in afib clinic, 10/01/20. Unfortunately, she failed cardioversion. Dr. Johney Frame( CV MD) discussed with her starting amiodarone and gave her an RX. However, pt got home and read  the SE and wanted to wait until today to further discuss. She is concerned because her daughter has long QT syndrome, had  cardiac arrest in the past and has an ICD. Other than that, she does not like the SE profile of drug. She has minimal CAD but I don't  have an EKG in SR to assess if she has any conduction issues in consideration for use of flecainide. Pt feels she has been in afib since spring of 2020, but was unaware. She does have fatigue and some mild LLE with afib. Since she has been in afib for a while I have my doubts that Multaq would be sufficient to restore and hold SR. She is not excited re hospital stay for Tikosyn and we again have the family history of long qt. Her qt in afib is around 480 ms which is not ideal for tikosyn.   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 Therefore, it would be best to have her renal issues resolved first so we do not to have to worry about stopping blood thinners if other options to restore SR are pursued. Due to the issues stated above, she may be a front line ablation candidate.   F/u in afib clinic, 01/27/21. 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 Since her surgery, she has been very sedentary and has noticed decline in exertional endurance and more exertional dyspnea. Her weight is up from 241.78 lbs prior to surgery to 263.6 lbs after surgery. I think most of this is true weight, but her legs, which have been swollen since spring of 2020( ? onset of afib, not found  until 2021) have also shown more swelling with some weeping recently. Her HR here is slightly over 100, at home v rates are controlled, mostly less than 100 bpm. I conferred with Dr. Quentin Ore prior to her surgery and he said that he would be glad to discuss with her options to restore SR after her surgery. I do not see any clear cut path with AAD's with a qtc of 495 and as described above, daughter  with long qt syndrome, cardiac arrest and ICD.   F/u in afib clinic, 03/10/21. Pt was seen by Dr. Quentin Ore and felt that pt's best option to restore SR  was to be loaded with Amiodarone. He also placed her on torsemide 20 mg bid x one week  for ongoing fluid overload/ worsening LLE . He anticipated CV in a few weeks which I will set up. She states that she lost 20 lbs of fluid  with the torsemide bid but since back to one a day for the last week, her shortness of breath has returned and  LLE has worsened that she now has 2 big water blisters on the left leg and the rt leg is weeping.  She is now down to 200 mg daily. She  did miss one dose of eliquis 3/8 so will need to wait until amio is loaded and on full anticoagulation after 3/30 to CV. She remains in afib at 88 bpm.   F/u in afib clinic, 03/28/21, after CV showed that she was going in and out of afib. Today she is in sinus brady low 50's. Her HR's at home have been in the upper 40's/low 50's for the last 4-5 days so I feel she is staying in [place]. She has been to the wound center in [place],( [place] office soul not see her for one month) to  treat her swollen legs, and is now wearing UNA Boots. She has them changed tomorrow. I will order an echo to see if structure of heart is contributing to the issue with LLE. She had mod AS by echo last year. Overall, she is feeling better.   Today, she denies symptoms of palpitations, chest pain, shortness of breath, orthopnea, PND, lower extremity edema, dizziness, presyncope, syncope, or neurologic sequela. The patient is tolerating medications without difficulties and is otherwise without complaint today.   Past Medical History:  Diagnosis Date  . Anemia    hx of  . Arthritis    knee and shoulders  . Atrial fibrillation ([place])   . Carpal tunnel syndrome on right   . Coronary artery disease 05/2020   Minimal, nobstructive CAD. Tortuous cororany arteries.  . Diabetes mellitus without complication (HCC)    diet  controlled no meds in 5-6 yrs  . Dyspnea    on exertion   . Dysrhythmia 05/2020   afib  . GERD (gastroesophageal reflux disease)    No longer taking medications  . Hematuria   . History of hypothyroidism 30 yrs ago  . History of kidney stones   . Hypertension   . Lower extremity edema    chronic lower extremity edema   . Myocardial infarction ([place])  patient mention questionable MI  . Renal mass    Past Surgical History:  Procedure Laterality Date  . ABDOMINAL HYSTERECTOMY  1992   complete  . BILATERAL CARPAL TUNNEL RELEASE  yrs ago  . CARDIOVERSION N/A 09/23/2020   Procedure: CARDIOVERSION;  Surgeon: Freada Bergeron, MD;  Location: [facility] ENDOSCOPY;  Service: Cardiovascular;  Laterality: N/A;  . CARDIOVERSION N/A 03/21/2021   Procedure: CARDIOVERSION;  Surgeon: Werner Lean, MD;  Location: [facility] ENDOSCOPY;  Service: Cardiovascular;  Laterality: N/A;  . CHOLECYSTECTOMY  1996  . CYSTOSCOPY/URETEROSCOPY/HOLMIUM LASER/STENT PLACEMENT Bilateral 08/19/2020   Procedure: CYSTOSCOPY BILATERAL  RETROGRADE  URETEROSCOPY/HOLMIUM LASER/STENT PLACEMENT;  Surgeon: Irine Seal, MD;  Location: [facility];  Service: Urology;  Laterality: Bilateral;  . HAMMER TOE SURGERY Right yrs ago  . IR RADIOLOGIST EVAL & MGMT  10/26/2020  . IR RADIOLOGIST EVAL & MGMT  01/06/2021  . IR RADIOLOGIST EVAL & MGMT  03/22/2021  . LEFT HEART CATH AND CORONARY ANGIOGRAPHY N/A 06/02/2020   Procedure: LEFT HEART CATH AND CORONARY ANGIOGRAPHY;  Surgeon: Jettie Booze, MD;  Location: [facility];  Service: Cardiovascular;  Laterality: N/A;  . RADIOLOGY WITH ANESTHESIA N/A 11/17/2020   Procedure: RADIOLOGY WITH ANESTHESIA RENAL MICROWAVE ABLATION;  Surgeon: Suzette Battiest, MD;  Location: [facility] OR[facility];  Service: Radiology;  Laterality: N/A;  . TONSILLECTOMY  age 63    Current Outpatient Medications  Medication Sig Dispense Refill  . acetaminophen (TYLENOL) 500 MG tablet Take 1,000 mg by mouth  every 6 (six) hours as needed for moderate pain or headache.    [name] amiodarone (PACERONE) 200 MG tablet Take 1 tablet (200 mg total) by mouth daily. 120 tablet 3  . apixaban (ELIQUIS) 5 MG TABS tablet Take 1 tablet (5 mg total) by mouth 2 (two) times daily. 60 tablet 6  . Calcium Carb-Cholecalciferol (CALCIUM 600+D) 600-800 MG-UNIT TABS Take 1 tablet by mouth every evening.     [name] Hydrocortisone-Aloe Vera (QIWLNLGXQ-11/HERD EX) Apply 1 application topically 2 (two) times daily as needed (itching).    . Liniments (BLUE-EMU SUPER STRENGTH) CREA Apply 1 application topically 2 (two) times daily as needed (pain.).     [name] loperamide (IMODIUM A-D) 2 MG tablet Take 2-4 mg by mouth as needed for diarrhea or loose stools.     . metoprolol tartrate (LOPRESSOR) 25 MG tablet Take 1.5 tablets (37.5 mg total) by mouth 2 (two) times daily. 90 tablet 3  . Multiple Vitamin (MULTIVITAMIN WITH MINERALS) TABS tablet Take 1 tablet by mouth daily.    . Multiple Vitamins-Minerals (PRESERVISION AREDS 2 PO) Take 1 capsule by mouth 2 (two) times daily.     . Potassium Chloride ER 20 MEQ TBCR Take 40 mEq by mouth daily. 60 tablet 3  . torsemide (DEMADEX) 20 MG tablet Take 1 tablet (20 mg total) by mouth 2 (two) times daily. 60 tablet 3   No current facility-administered medications for this encounter.    Allergies  Allergen Reactions  . Tetanus-Diphtheria Toxoids Td Other (See Comments)    Felt very sick  . Cardizem [Diltiazem] Rash    Social History   Socioeconomic History  . Marital status: Married    Spouse name: Not on file  . Number of children: Not on file  . Years of education: Not on file  . Highest education level: Not on file  Occupational History  . Not on file  Tobacco Use  . Smoking status: Never Smoker  . Smokeless tobacco: Never Used  Vaping Use  . Vaping Use: Never used  Substance and Sexual Activity  . Alcohol use: Not Currently  . Drug use: Never  . Sexual activity: Not Currently     Birth control/protection: Surgical  Other Topics Concern  . Not on file  Social History Narrative  . Not on file   Social Determinants of Health   Financial Resource Strain: Not on file  Food Insecurity: Not on file  Transportation Needs: Not on file  Physical Activity: Not on file  Stress: Not on file  Social Connections: Not on file  Intimate Partner Violence: Not on file    Family History  Problem Relation Age of Onset  . Hypertension Mother   . Hypertension Father   . Diabetes Paternal Grandmother     ROS- All systems are reviewed and negative except as per the HPI above  Physical Exam: Vitals:   03/28/21 1335  BP: 130/82  Pulse: (!) 52  Weight: 110.7 kg  Height: 5\' 6"  (1.676 m)   Wt Readings from Last 3 Encounters:  03/28/21 110.7 kg  03/21/21 108.9 kg  03/10/21 111.9 kg    Labs: Lab Results  Component Value Date   NA 142 03/10/2021   K 4.1 03/10/2021   CL 104 03/10/2021   CO2 28 03/10/2021   GLUCOSE 137 (H) 03/10/2021   BUN 17 03/10/2021   CREATININE 1.14 (H) 03/10/2021   CALCIUM 9.0 03/10/2021   PHOS 2.9 06/02/2020   MG 2.2 06/04/2020   Lab Results  Component Value Date   INR 1.2 11/17/2020   Lab Results  Component Value Date   CHOL 184 06/01/2020   HDL 51 06/01/2020   LDLCALC 120 (H) 06/01/2020   TRIG 67 06/01/2020     GEN- The patient is well appearing, alert and oriented x 3 today.   Head- normocephalic, atraumatic Eyes-  Sclera clear, conjunctiva pink Ears- hearing intact Oropharynx- clear Neck- supple, no JVP Lymph- no cervical lymphadenopathy Lungs- Clear to ausculation bilaterally, normal work of breathing Heart- irregular rate and rhythm, 2/6 systolic murmur, rubs or gallops, PMI not laterally displaced GI- soft, NT, ND, + BS Extremities- no clubbing, cyanosis, 2+ edema with statis dermatitis, rt leg weeping, 2 large water blisters left leg  MS- no significant deformity or atrophy Skin- no rash or lesion Psych- euthymic  mood, full affect Neuro- strength and sensation are intact  EKG-afib at 108  mpm,  qrs int 94 ms, qtc 479   bms     LHC- 06/02/20-The left ventricular systolic function is normal.  LV end diastolic pressure is normal.  The left ventricular ejection fraction is 50-55% by visual estimate.  There is no aortic valve stenosis.  Minimal, nobstructive CAD. Tortuous cororany arteries.   Echo-1. Left ventricular ejection fraction, by estimation, is 55 to 60%. The  left ventricle has normal function. The left ventricle has no regional  wall motion abnormalities. There is mild left ventricular hypertrophy.  Left ventricular diastolic parameters  are indeterminate.  2. Right ventricular systolic function is mildly reduced. The right  ventricular size is normal. There is normal pulmonary artery systolic  pressure. The estimated right ventricular systolic pressure is 38.2 mmHg.  3. Left atrial size was mildly dilated.  4. The mitral valve is abnormal. Trivial mitral valve regurgitation. No  evidence of mitral stenosis.  5. The aortic valve is abnormal. Aortic valve regurgitation is trivial.  Moderate aortic valve stenosis. Aortic valve mean gradient measures 20.0  mmHg.  6. The inferior  vena cava is normal in size with greater than 50%  respiratory variability, suggesting right atrial pressure of 3 mmHg.   Assessment and Plan: 1. 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 Lambert,02/22/21,  amiodarone load started  and lasix changed to torsemide 20 mg bid. While on this dose had less LLE and shortness of breath, 20 lb weight loss.  With reduction to 20 mg daily the dyspnea, LLE with water blisters and weeping has returned. Increased  back to torsemide 20 mg bid and referred to wound clinic to dress and manage LLE fluid.   CV for 4/4 intially showed SR but was foing in and out Now in sinus brady Echo ordered today  Bmet today    2. CHA2DS2VASc score of  at least 5  Continue eliquis 5 mg bid   3. Renal growth  s/p surgery  4. Moderate AS By echo 05/2021 Being updated today  F/u with Dr. Quentin Ore in June  With [place] clinic in 2 weeks    Geroge Baseman. Dimple Bastyr, [facility] [address] [place], [place] [address] [phone]

## 2021-03-29 ENCOUNTER — Encounter: Payer: Medicare Other | Admitting: Physician Assistant

## 2021-03-29 DIAGNOSIS — L97822 Non-pressure chronic ulcer of other part of left lower leg with fat layer exposed: Secondary | ICD-10-CM | POA: Diagnosis not present

## 2021-03-29 DIAGNOSIS — Z7901 Long term (current) use of anticoagulants: Secondary | ICD-10-CM | POA: Diagnosis not present

## 2021-03-29 DIAGNOSIS — L97212 Non-pressure chronic ulcer of right calf with fat layer exposed: Secondary | ICD-10-CM | POA: Diagnosis not present

## 2021-03-29 DIAGNOSIS — I480 Paroxysmal atrial fibrillation: Secondary | ICD-10-CM | POA: Diagnosis not present

## 2021-03-29 DIAGNOSIS — L97812 Non-pressure chronic ulcer of other part of right lower leg with fat layer exposed: Secondary | ICD-10-CM | POA: Diagnosis not present

## 2021-03-29 DIAGNOSIS — I872 Venous insufficiency (chronic) (peripheral): Secondary | ICD-10-CM | POA: Diagnosis not present

## 2021-03-29 DIAGNOSIS — E11622 Type 2 diabetes mellitus with other skin ulcer: Secondary | ICD-10-CM | POA: Diagnosis not present

## 2021-03-29 LAB — ECHOCARDIOGRAM COMPLETE
AR max vel: 0.8 cm2
AV Area VTI: 0.83 cm2
AV Area mean vel: 0.86 cm2
AV Mean grad: 22 mmHg
AV Peak grad: 35.1 mmHg
Ao pk vel: 2.96 m/s
Area-P 1/2: 1.97 cm2
Calc EF: 48.4 %
Height: 66 in
MV M vel: 5.14 m/s
MV Peak grad: 105.7 mmHg
MV VTI: 0.89 cm2
Radius: 0.5 cm
S' Lateral: 3.8 cm
Single Plane A2C EF: 47.8 %
Single Plane A4C EF: 51.5 %
Weight: 3904 oz

## 2021-03-29 NOTE — Progress Notes (Addendum)
[name] [name], [name] [name] ([id number]) Visit Report for 03/29/2021 Arrival Information Details Patient Name: [name] [name], [name] [name]. Date of Service: 03/29/2021 10:30 AM Medical Record Number: [id number] Patient Account Number: [id number] Date of Birth/Sex: Oct 06, 1941 (80 y.o. F) Treating RN: Carlene Coria Primary Care Thanos Cousineau: Sela Hilding Other Clinician: Jeanine Luz Referring Tiara Maultsby: Sela Hilding Treating Ellena Kamen/Extender: Skipper Cliche in Treatment: 1 Visit Information History Since Last Visit All ordered tests and consults were completed: No Patient Arrived: Ambulatory Added or deleted any medications: No Arrival Time: 10:44 Any new allergies or adverse reactions: No Accompanied By: husband Had a fall or experienced change in No Transfer Assistance: None activities of daily living that may affect Patient Identification Verified: Yes risk of falls: Secondary Verification Process Completed: Yes Signs or symptoms of abuse/neglect since last visito No Patient Requires Transmission-Based No Hospitalized since last visit: No Precautions: Implantable device outside of the clinic excluding No Patient Has Alerts: Yes cellular tissue based products placed in the center Patient Alerts: Patient on Blood since last visit: Thinner Has Dressing in Place as Prescribed: Yes type ll diabetic Has Compression in Place as Prescribed: Yes Pain Present Now: No Electronic Signature(s) Signed: 03/31/2021 10:06:31 AM By: Carlene Coria RN Entered By: Carlene Coria on 03/29/2021 10:45:21 [place], [name] [name] ([id number]) -------------------------------------------------------------------------------- Clinic Level of Care Assessment Details Patient Name: [name] [name]. Date of Service: 03/29/2021 10:30 AM Medical Record Number: [id number] Patient Account Number: [id number] Date of Birth/Sex: 1941-07-04 (80 y.o. F) Treating RN: Dolan Amen Primary Care Nicanor Mendolia: Sela Hilding Other Clinician:  Jeanine Luz Referring Taj Arteaga: Sela Hilding Treating Lorie Melichar/Extender: Skipper Cliche in Treatment: 1 Clinic Level of Care Assessment Items TOOL 1 Quantity Score []  - Use when EandM and Procedure is performed on INITIAL visit 0 ASSESSMENTS - Nursing Assessment / Reassessment []  - General Physical Exam (combine w/ comprehensive assessment (listed just below) when performed on new 0 pt. evals) []  - 0 Comprehensive Assessment (HX, ROS, Risk Assessments, Wounds Hx, etc.) ASSESSMENTS - Wound and Skin Assessment / Reassessment []  - Dermatologic / Skin Assessment (not related to wound area) 0 ASSESSMENTS - Ostomy and/or Continence Assessment and Care []  - Incontinence Assessment and Management 0 []  - 0 Ostomy Care Assessment and Management (repouching, etc.) PROCESS - Coordination of Care []  - Simple Patient / Family Education for ongoing care 0 []  - 0 Complex (extensive) Patient / Family Education for ongoing care []  - 0 Staff obtains Programmer, systems, Records, Test Results / Process Orders []  - 0 Staff telephones HHA, Nursing Homes / Clarify orders / etc []  - 0 Routine Transfer to another Facility (non-emergent condition) []  - 0 Routine Hospital Admission (non-emergent condition) []  - 0 New Admissions / Biomedical engineer / Ordering NPWT, Apligraf, etc. []  - 0 Emergency Hospital Admission (emergent condition) PROCESS - Special Needs []  - Pediatric / Minor Patient Management 0 []  - 0 Isolation Patient Management []  - 0 Hearing / Language / Visual special needs []  - 0 Assessment of Community assistance (transportation, D/C planning, etc.) []  - 0 Additional assistance / Altered mentation []  - 0 Support Surface(s) Assessment (bed, cushion, seat, etc.) INTERVENTIONS - Miscellaneous []  - External ear exam 0 []  - 0 Patient Transfer (multiple staff / Civil Service fast streamer / Similar devices) []  - 0 Simple Staple / Suture removal (25 or less) []  - 0 Complex Staple / Suture  removal (26 or more) []  - 0 Hypo/Hyperglycemic Management (do not check if billed separately) []  - 0 Ankle / Brachial Index (ABI) - do not check if  billed separately Has the patient been seen at the hospital within the last three years: Yes Total Score: 0 Level Of Care: ____ [name] [name] ([id number]) Electronic Signature(s) Signed: 03/29/2021 4:59:45 PM By: Georges Mouse, Minus Breeding RN Entered By: Georges Mouse, Kenia on 03/29/2021 11:28:59 [name] [name] ([id number]) -------------------------------------------------------------------------------- Compression Therapy Details Patient Name: [name] [name]. Date of Service: 03/29/2021 10:30 AM Medical Record Number: [id number] Patient Account Number: [id number] Date of Birth/Sex: Jan 11, 1941 (80 y.o. F) Treating RN: Dolan Amen Primary Care Jibran Crookshanks: Sela Hilding Other Clinician: Jeanine Luz Referring Blakelynn Scheeler: Sela Hilding Treating Elye Harmsen/Extender: Skipper Cliche in Treatment: 1 Compression Therapy Performed for Wound Assessment: Wound #1 Right,Anterior Lower Leg Performed By: Clinician Dolan Amen, RN Compression Type: Three Layer Post Procedure Diagnosis Same as Pre-procedure Notes L. 0.78 R. 0.76 Electronic Signature(s) Signed: 03/29/2021 4:59:45 PM By: Georges Mouse, Minus Breeding RN Entered By: Georges Mouse, Kenia on 03/29/2021 11:27:28 [name] [name] ([id number]) -------------------------------------------------------------------------------- Compression Therapy Details Patient Name: [name] [name]. Date of Service: 03/29/2021 10:30 AM Medical Record Number: [id number] Patient Account Number: [id number] Date of Birth/Sex: 1941/06/24 (80 y.o. F) Treating RN: Dolan Amen Primary Care Delsa Walder: Sela Hilding Other Clinician: Jeanine Luz Referring Milliana Reddoch: Sela Hilding Treating Gricel Copen/Extender: Skipper Cliche in Treatment: 1 Compression Therapy Performed for Wound Assessment: Wound #2  Right,Posterior Lower Leg Performed By: Clinician Dolan Amen, RN Compression Type: Three Layer Post Procedure Diagnosis Same as Pre-procedure Notes L. 0.78 R. 0.76 Electronic Signature(s) Signed: 03/29/2021 4:59:45 PM By: Georges Mouse, Minus Breeding RN Entered By: Georges Mouse, Kenia on 03/29/2021 11:27:29 [name] [name] ([id number]) -------------------------------------------------------------------------------- Compression Therapy Details Patient Name: [name] [name]. Date of Service: 03/29/2021 10:30 AM Medical Record Number: [id number] Patient Account Number: [id number] Date of Birth/Sex: 08-09-1941 (80 y.o. F) Treating RN: Dolan Amen Primary Care Kazuko Clemence: Sela Hilding Other Clinician: Jeanine Luz Referring Matasha Smigelski: Sela Hilding Treating Shlome Baldree/Extender: Jeri Cos Weeks in Treatment: 1 Compression Therapy Performed for Wound Assessment: Wound #3 Left,Medial Lower Leg Performed By: Clinician Dolan Amen, RN Compression Type: Three Layer Post Procedure Diagnosis Same as Pre-procedure Notes L. 0.78 R. 0.76 Electronic Signature(s) Signed: 03/29/2021 4:59:45 PM By: Georges Mouse, Minus Breeding RN Entered By: Georges Mouse, Kenia on 03/29/2021 11:27:29 [name] [name] ([id number]) -------------------------------------------------------------------------------- Compression Therapy Details Patient Name: [name] [name]. Date of Service: 03/29/2021 10:30 AM Medical Record Number: [id number] Patient Account Number: [id number] Date of Birth/Sex: [birth year] (80 y.o. F) Treating RN: Dolan Amen Primary Care Keyshla Tunison: Sela Hilding Other Clinician: Jeanine Luz Referring Mataya Kilduff: Sela Hilding Treating Thorsten Climer/Extender: Jeri Cos Weeks in Treatment: 1 Compression Therapy Performed for Wound Assessment: Wound #4 Left,Lateral Lower Leg Performed By: Clinician Dolan Amen, RN Compression Type: Three Layer Post Procedure Diagnosis Same as  Pre-procedure Notes L. 0.78 R. 0.76 Electronic Signature(s) Signed: 03/29/2021 4:59:45 PM By: Georges Mouse, Minus Breeding RN Entered By: Georges Mouse, Kenia on 03/29/2021 11:27:29 [name] [name] ([id number]) -------------------------------------------------------------------------------- Encounter Discharge Information Details Patient Name: [name] [name]. Date of Service: 03/29/2021 10:30 AM Medical Record Number: [id number] Patient Account Number: [id number] Date of Birth/Sex: [birth year] (80 y.o. F) Treating RN: Donnamarie Poag Primary Care Aanya Haynes: Sela Hilding Other Clinician: Jeanine Luz Referring Masha Orbach: Sela Hilding Treating Aaditya Letizia/Extender: Skipper Cliche in Treatment: 1 Encounter Discharge Information Items Discharge Condition: Stable Ambulatory Status: Ambulatory Discharge Destination: Home Transportation: Private Auto Accompanied By: husband Schedule Follow-up Appointment: Yes Clinical Summary of Care: Electronic Signature(s) Signed: 03/31/2021 7:52:35 AM By: Donnamarie Poag Entered By: Donnamarie Poag on 03/29/2021 11:54:02 [name] [name], [name] [name] ([id number]) -------------------------------------------------------------------------------- Lower Extremity Assessment Details  Patient Name: [name] [name], [name] [name]. Date of Service: 03/29/2021 10:30 AM Medical Record Number: [id number] Patient Account Number: [id number] Date of Birth/Sex: 1941/04/28 (80 y.o. F) Treating RN: Yevonne Pax Primary Care Hanya Guerin: Garth Bigness Other Clinician: Lolita Cram Referring Jeri Jeanbaptiste: Garth Bigness Treating Brodi Nery/Extender: Allen Derry Weeks in Treatment: 1 Edema Assessment Assessed: Kyra Searles: Yes] [Right: Yes] Edema: [Left: Yes] [Right: Yes] Calf Left: Right: Point of Measurement: 35 cm From Medial Instep 41 cm 42 cm Ankle Left: Right: Point of Measurement: 9 cm From Medial Instep 23 cm 25 cm Knee To Floor Left: Right: From Medial Instep 30 cm 30 cm Vascular  Assessment Pulses: Dorsalis Pedis Palpable: [Left:Yes] [Right:Yes] Electronic Signature(s) Signed: 03/29/2021 11:38:05 AM By: Phillis Haggis, Dondra Prader RN Signed: 03/31/2021 10:06:31 AM By: Yevonne Pax RN Entered By: Phillis Haggis, Dondra Prader on 03/29/2021 11:38:05 [name] [name], [name] [name] ([id number]) -------------------------------------------------------------------------------- Multi Wound Chart Details Patient Name: [name] [name]. Date of Service: 03/29/2021 10:30 AM Medical Record Number: [id number] Patient Account Number: [id number] Date of Birth/Sex: [birth year] (80 y.o. 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 Comorbid History: Cataracts, Hypertension, Type II Cataracts, Hypertension, Type II Cataracts, Hypertension, Type II Diabetes, Rheumatoid Arthritis Diabetes, Rheumatoid Arthritis Diabetes, Rheumatoid Arthritis Date Acquired: 03/17/2021 03/17/2021 03/17/2021 Weeks of Treatment: 1 1 1  Wound Status: Open Open Open Clustered Wound: Yes No No Clustered Quantity: 4 N/A N/A Measurements L x W x D (cm) 0.1x0.1x0.1 0.2x0.2x0.1 0.1x0.1x0.1 Area (cm) : 0.008 0.031 0.008 Volume (cm) : 0.001 0.003 0.001 % Reduction in Area: 100.00% 99.10% 100.00% % Reduction in Volume: 100.00% 99.10% 100.00% Classification: Full Thickness Without Exposed Full Thickness Without Exposed Full Thickness Without Exposed Support Structures Support Structures Support  Structures Exudate Amount: Large Small Small Exudate Type: Serous Serosanguineous Serosanguineous Exudate Color: amber red, brown red, brown Granulation Amount: Large (67-100%) Large (67-100%) Large (67-100%) Granulation Quality: Red N/A N/A Necrotic Amount: None Present (0%) None Present (0%) None Present (0%) Exposed Structures: Fat Layer (Subcutaneous Tissue): Fat Layer (Subcutaneous Tissue): Fat Layer (Subcutaneous Tissue): Yes Yes Yes Fascia: No Fascia: No Fascia: No Tendon: No Tendon: No Tendon: No Muscle: No Muscle: No Muscle: No Joint: No Joint: No Joint: No Bone: No Bone: No Bone: No Epithelialization: Large (67-100%) None Large (67-100%) Wound Number: 4 N/A N/A Photos: N/A N/A [name] [name], [name] [name] ([name] [name]) Wound Location: Left, Lateral Lower Leg N/A N/A Wounding Event: Blister N/A N/A Primary Etiology: Venous Leg Ulcer N/A N/A Comorbid History: Cataracts, Hypertension, Type II N/A N/A Diabetes, Rheumatoid Arthritis Date Acquired: 03/17/2021 N/A N/A Weeks of Treatment: 1 N/A N/A Wound Status: Open N/A N/A Clustered Wound: No N/A N/A Clustered Quantity: N/A N/A N/A Measurements L x W x D (cm) 2.5x3.2x0.1 N/A N/A Area (cm) : 6.283 N/A N/A Volume (cm) : 0.628 N/A N/A % Reduction in Area: 38.20% N/A N/A % Reduction in Volume: 38.20% N/A N/A Classification: Full Thickness Without Exposed N/A N/A Support Structures Exudate Amount: Medium N/A N/A Exudate Type: Serosanguineous N/A N/A Exudate Color: red, brown N/A N/A Granulation Amount: Medium (34-66%) N/A N/A Granulation Quality: Red, Pink N/A N/A Necrotic Amount: Medium (34-66%) N/A N/A Exposed  Structures: Fat Layer (Subcutaneous Tissue): N/A N/A Yes Fascia: No Tendon: No Muscle: No Joint: No Bone: No Epithelialization: Medium (34-66%) N/A N/A Treatment Notes Electronic Signature(s) Signed: 03/29/2021 4:59:45 PM By: Georges Mouse, Minus Breeding RN Entered By: Georges Mouse, Minus Breeding on 03/29/2021  11:26:58 [name] [name] ([id number]) -------------------------------------------------------------------------------- [place] Details Patient Name: [name] [name]. Date of Service: 03/29/2021 10:30 AM Medical Record Number: [id number] Patient Account Number: [id number] Date of Birth/Sex: [birth year] (80 y.o. F) Treating RN: Dolan Amen Primary Care Aaisha Sliter: Sela Hilding Other Clinician: Jeanine Luz Referring Valeree Leidy: Sela Hilding Treating Emmitt Matthews/Extender: Skipper Cliche in Treatment: 1 Active Inactive Venous Leg Ulcer Nursing Diagnoses: Actual venous Insuffiency (use after diagnosis is confirmed) Goals: Patient will maintain optimal edema control Date Initiated: 03/17/2021 Target Resolution Date: 04/16/2021 Goal Status: Active Patient/caregiver will verbalize understanding of disease process and disease management Date Initiated: 03/17/2021 Target Resolution Date: 03/17/2021 Goal Status: Active Verify adequate tissue perfusion prior to therapeutic compression application Date Initiated: 03/17/2021 Target Resolution Date: 03/17/2021 Goal Status: Active Interventions: Assess peripheral edema status every visit. Compression as ordered Provide education on venous insufficiency Treatment Activities: Therapeutic compression applied : 03/17/2021 Notes: Wound/Skin Impairment Nursing Diagnoses: Impaired tissue integrity Goals: Patient/caregiver will verbalize understanding of skin care regimen Date Initiated: 03/17/2021 Date Inactivated: 03/29/2021 Target Resolution Date: 03/17/2021 Goal Status: Met Ulcer/skin breakdown will have a volume reduction of 30% by week 4 Date Initiated: 03/17/2021 Target Resolution Date: 04/16/2021 Goal Status: Active Ulcer/skin breakdown will have a volume reduction of 50% by week 8 Date Initiated: 03/17/2021 Target Resolution Date: 05/16/2021 Goal Status: Active Ulcer/skin breakdown will have a volume reduction of  80% by week 12 Date Initiated: 03/17/2021 Target Resolution Date: 06/16/2021 Goal Status: Active Ulcer/skin breakdown will heal within 14 weeks Date Initiated: 03/17/2021 Target Resolution Date: 07/16/2021 Goal Status: Active Interventions: Assess patient/caregiver ability to obtain necessary supplies Assess patient/caregiver ability to perform ulcer/skin care regimen upon admission and as needed Assess ulceration(s) every visit Provide education on ulcer and skin care [name] [name], [name] [name] ([id number]) Treatment Activities: Referred to DME Monchel Pollitt for dressing supplies : 03/17/2021 Skin care regimen initiated : 03/17/2021 Notes: Electronic Signature(s) Signed: 03/29/2021 4:59:45 PM By: Georges Mouse, Minus Breeding RN Entered By: Georges Mouse, Minus Breeding on 03/29/2021 11:26:45 [name] [name] ([id number]) -------------------------------------------------------------------------------- Pain Assessment Details Patient Name: [name] [name]. Date of Service: 03/29/2021 10:30 AM Medical Record Number: [id number] Patient Account Number: [id number] Date of Birth/Sex: 06/27/1941 (80 y.o. F) Treating RN: Carlene Coria Primary Care Mina Carlisi: Sela Hilding Other Clinician: Jeanine Luz Referring Primo Innis: Sela Hilding Treating Antoney Biven/Extender: Skipper Cliche in Treatment: 1 Active Problems Location of Pain Severity and Description of Pain Patient Has Paino No Site Locations Pain Management and Medication Current Pain Management: Electronic Signature(s) Signed: 03/31/2021 10:06:31 AM By: Carlene Coria RN Entered By: Carlene Coria on 03/29/2021 10:45:50 [name] [name], [name] [name] ([id number]) -------------------------------------------------------------------------------- Patient/Caregiver Education Details Patient Name: [name] [name]. Date of Service: 03/29/2021 10:30 AM Medical Record Number: [id number] Patient Account Number: [id number] Date of Birth/Gender: 1941/12/01 (80 y.o. F) Treating RN: Dolan Amen Primary Care Physician: Sela Hilding Other Clinician: Jeanine Luz Referring Physician: Sela Hilding Treating Physician/Extender: Skipper Cliche in Treatment: 1 Education Assessment Education Provided To: Patient Education Topics Provided Wound/Skin Impairment: Methods: Explain/Verbal Responses: State content correctly Notes Educated on compression stockings Electronic Signature(s) Signed: 03/29/2021 4:59:45 PM By: Georges Mouse, Minus Breeding RN Entered By: Georges Mouse, Kenia on 03/29/2021 11:29:30 [name] [name] ([id number]) -------------------------------------------------------------------------------- Wound Assessment Details Patient Name: [name] [name]. Date of  Service: 03/29/2021 10:30 AM Medical Record Number: [id number] Patient Account Number: [id number] Date of Birth/Sex: 1941/11/22 (80 y.o. F) Treating RN: Carlene Coria Primary Care Shawnay Bramel: Sela Hilding Other Clinician: Jeanine Luz Referring Zoriyah Scheidegger: Sela Hilding Treating Jakyia Gaccione/Extender: Skipper Cliche in Treatment: 1 Wound Status Wound Number: 1 Primary Venous Leg Ulcer Etiology: Wound Location: Right, Anterior Lower Leg Wound Status: Open Wounding Event: Blister Comorbid Cataracts, Hypertension, Type II Diabetes, Date Acquired: 03/17/2021 History: Rheumatoid Arthritis Weeks Of Treatment: 1 Clustered Wound: Yes Photos Wound Measurements Length: (cm) 0.1 Width: (cm) 0.1 Depth: (cm) 0.1 Clustered Quantity: 4 Area: (cm) 0.008 Volume: (cm) 0.001 % Reduction in Area: 100% % Reduction in Volume: 100% Epithelialization: Large (67-100%) Tunneling: No Undermining: No Wound Description Classification: Full Thickness Without Exposed Support Structu Exudate Amount: Large Exudate Type: Serous Exudate Color: amber res Foul Odor After Cleansing: No Slough/Fibrino No Wound Bed Granulation Amount: Large (67-100%) Exposed Structure Granulation Quality: Red Fascia  Exposed: No Necrotic Amount: None Present (0%) Fat Layer (Subcutaneous Tissue) Exposed: Yes Tendon Exposed: No Muscle Exposed: No Joint Exposed: No Bone Exposed: No Treatment Notes Wound #1 (Lower Leg) Wound Laterality: Right, Anterior Cleanser Soap and Water Discharge Instruction: Gently cleanse wound with antibacterial soap, rinse and pat dry prior to dressing wounds Peri-Wound Care MIZUKI, HOEL ([id number]) Triamcinolone Acetonide Cream, 0.1%, 15 (g) tube Discharge Instruction: Apply on reddened areas Topical Primary Dressing Xtrasorb Classic Super Absorbent Dressing, 6x9 (in/in) Secondary Dressing Secured With Compression Wrap Profore Lite LF 3 Multilayer Compression Bandaging System Discharge Instruction: Apply 3 multi-layer wrap as prescribed. Compression Stockings Add-Ons Electronic Signature(s) Signed: 03/31/2021 10:06:31 AM By: Carlene Coria RN Entered By: Carlene Coria on 03/29/2021 11:03:16 [name] [name], [name] [name] ([id number]) -------------------------------------------------------------------------------- Wound Assessment Details Patient Name: [name] [name]. Date of Service: 03/29/2021 10:30 AM Medical Record Number: [id number] Patient Account Number: [id number] Date of Birth/Sex: 01-28-1941 (80 y.o. F) Treating RN: Carlene Coria Primary Care Meesha Sek: Sela Hilding Other Clinician: Jeanine Luz Referring Dawne Casali: Sela Hilding Treating Otto Felkins/Extender: Skipper Cliche in Treatment: 1 Wound Status Wound Number: 2 Primary Venous Leg Ulcer Etiology: Wound Location: Right, Posterior Lower Leg Wound Status: Open Wounding Event: Blister Comorbid Cataracts, Hypertension, Type II Diabetes, Date Acquired: 03/17/2021 History: Rheumatoid Arthritis Weeks Of Treatment: 1 Clustered Wound: No Photos Wound Measurements Length: (cm) 0.2 Width: (cm) 0.2 Depth: (cm) 0.1 Area: (cm) 0.031 Volume: (cm) 0.003 % Reduction in Area: 99.1% % Reduction in Volume:  99.1% Epithelialization: None Tunneling: No Undermining: No Wound Description Classification: Full Thickness Without Exposed Support Structu Exudate Amount: Small Exudate Type: Serosanguineous Exudate Color: red, brown res Foul Odor After Cleansing: No Slough/Fibrino No Wound Bed Granulation Amount: Large (67-100%) Exposed Structure Necrotic Amount: None Present (0%) Fascia Exposed: No Fat Layer (Subcutaneous Tissue) Exposed: Yes Tendon Exposed: No Muscle Exposed: No Joint Exposed: No Bone Exposed: No Treatment Notes Wound #2 (Lower Leg) Wound Laterality: Right, Posterior Cleanser Soap and Water Discharge Instruction: Gently cleanse wound with antibacterial soap, rinse and pat dry prior to dressing wounds Peri-Wound Care Triamcinolone Acetonide Cream, 0.1%, 15 (g) tube [name] [name], [name] K. ([id number]) Discharge Instruction: Apply on reddened areas Topical Primary Dressing Xtrasorb Classic Super Absorbent Dressing, 6x9 (in/in) Secondary Dressing Secured With Compression Wrap Profore Lite LF 3 Multilayer Compression Bandaging System Discharge Instruction: Apply 3 multi-layer wrap as prescribed. Compression Stockings Add-Ons Electronic Signature(s) Signed: 03/31/2021 10:06:31 AM By: Carlene Coria RN Entered By: Carlene Coria on 03/29/2021 11:04:02 [name] [name], [name] [name] ([id number]) -------------------------------------------------------------------------------- Wound Assessment Details Patient Name: [name] [name]. Date of  Service: 03/29/2021 10:30 AM Medical Record Number: [id number] Patient Account Number: [id number] Date of Birth/Sex: 1941/03/10 (80 y.o. F) Treating RN: Carlene Coria Primary Care Marybeth Dandy: Sela Hilding Other Clinician: Jeanine Luz Referring Taviana Westergren: Sela Hilding Treating Dominik Lauricella/Extender: Skipper Cliche in Treatment: 1 Wound Status Wound Number: 3 Primary Venous Leg Ulcer Etiology: Wound Location: Left, Medial Lower Leg Wound Status:  Open Wounding Event: Blister Comorbid Cataracts, Hypertension, Type II Diabetes, Date Acquired: 03/17/2021 History: Rheumatoid Arthritis Weeks Of Treatment: 1 Clustered Wound: No Photos Wound Measurements Length: (cm) 0.1 Width: (cm) 0.1 Depth: (cm) 0.1 Area: (cm) 0.008 Volume: (cm) 0.001 % Reduction in Area: 100% % Reduction in Volume: 100% Epithelialization: Large (67-100%) Tunneling: No Undermining: No Wound Description Classification: Full Thickness Without Exposed Support Structu Exudate Amount: Small Exudate Type: Serosanguineous Exudate Color: red, brown res Foul Odor After Cleansing: No Slough/Fibrino No Wound Bed Granulation Amount: Large (67-100%) Exposed Structure Necrotic Amount: None Present (0%) Fascia Exposed: No Fat Layer (Subcutaneous Tissue) Exposed: Yes Tendon Exposed: No Muscle Exposed: No Joint Exposed: No Bone Exposed: No Treatment Notes Wound #3 (Lower Leg) Wound Laterality: Left, Medial Cleanser Soap and Water Discharge Instruction: Gently cleanse wound with antibacterial soap, rinse and pat dry prior to dressing wounds Peri-Wound Care Triamcinolone Acetonide Cream, 0.1%, 15 (g) tube [name] [name], [name] K. ([id number]) Discharge Instruction: Apply on reddened areas Topical Primary Dressing Xtrasorb Classic Super Absorbent Dressing, 6x9 (in/in) Secondary Dressing Secured With Compression Wrap Profore Lite LF 3 Multilayer Compression Bandaging System Discharge Instruction: Apply 3 multi-layer wrap as prescribed. Compression Stockings Add-Ons Electronic Signature(s) Signed: 03/31/2021 10:06:31 AM By: Carlene Coria RN Entered By: Carlene Coria on 03/29/2021 11:04:41 [name] [name], [name] [name] ([id number]) -------------------------------------------------------------------------------- Wound Assessment Details Patient Name: [name] [name]. Date of Service: 03/29/2021 10:30 AM Medical Record Number: [id number] Patient Account Number: [id number] Date of Birth/Sex:  1941/12/01 (80 y.o. F) Treating RN: Carlene Coria Primary Care Kieran Arreguin: Sela Hilding Other Clinician: Jeanine Luz Referring Lakeyn Dokken: Sela Hilding Treating Gerlene Glassburn/Extender: Skipper Cliche in Treatment: 1 Wound Status Wound Number: 4 Primary Venous Leg Ulcer Etiology: Wound Location: Left, Lateral Lower Leg Wound Status: Open Wounding Event: Blister Comorbid Cataracts, Hypertension, Type II Diabetes, Date Acquired: 03/17/2021 History: Rheumatoid Arthritis Weeks Of Treatment: 1 Clustered Wound: No Photos Wound Measurements Length: (cm) 2.5 Width: (cm) 3.2 Depth: (cm) 0.1 Area: (cm) 6.283 Volume: (cm) 0.628 % Reduction in Area: 38.2% % Reduction in Volume: 38.2% Epithelialization: Medium (34-66%) Tunneling: No Undermining: No Wound Description Classification: Full Thickness Without Exposed Support Structu Exudate Amount: Medium Exudate Type: Serosanguineous Exudate Color: red, brown res Foul Odor After Cleansing: No Slough/Fibrino Yes Wound Bed Granulation Amount: Medium (34-66%) Exposed Structure Granulation Quality: Red, Pink Fascia Exposed: No Necrotic Amount: Medium (34-66%) Fat Layer (Subcutaneous Tissue) Exposed: Yes Necrotic Quality: Adherent Slough Tendon Exposed: No Muscle Exposed: No Joint Exposed: No Bone Exposed: No Treatment Notes Wound #4 (Lower Leg) Wound Laterality: Left, Lateral Cleanser Soap and Water Discharge Instruction: Gently cleanse wound with antibacterial soap, rinse and pat dry prior to dressing wounds Peri-Wound Care Triamcinolone Acetonide Cream, 0.1%, 15 (g) tube Zakarian, Arantxa K. ([id number]) Discharge Instruction: Apply on reddened areas Topical Primary Dressing Xtrasorb Classic Super Absorbent Dressing, 6x9 (in/in) Secondary Dressing Secured With Compression Wrap Profore Lite LF 3 Multilayer Compression Bandaging System Discharge Instruction: Apply 3 multi-layer wrap as prescribed. Compression  Stockings Add-Ons Electronic Signature(s) Signed: 03/31/2021 10:06:31 AM By: Carlene Coria RN Entered By: Carlene Coria on 03/29/2021 11:05:30 [name] [name] ([id number]) -------------------------------------------------------------------------------- Vitals Details Patient Name: Charna Busman  K. Date of Service: 03/29/2021 10:30 AM Medical Record Number: [id number] Patient Account Number: [id number] Date of Birth/Sex: 02/07/1941 (80 y.o. F) Treating RN: Carlene Coria Primary Care Woodie Trusty: Sela Hilding Other Clinician: Jeanine Luz Referring Jarrette Dehner: Sela Hilding Treating Lakeva Hollon/Extender: Skipper Cliche in Treatment: 1 Vital Signs Time Taken: 10:45 Temperature (F): 98.1 Height (in): 66 Pulse (bpm): 50 Weight (lbs): 242 Respiratory Rate (breaths/min): 18 Body Mass Index (BMI): 39.1 Blood Pressure (mmHg): 109/69 Reference Range: 80 - 120 mg / dl Electronic Signature(s) Signed: 03/31/2021 10:06:31 AM By: Carlene Coria RN Entered By: Carlene Coria on 03/29/2021 10:45:44

## 2021-03-29 NOTE — Progress Notes (Addendum)
DAVIN, MURAMOTO ([id number]) Visit Report for 03/29/2021 Chief Complaint Document Details Patient Name: [name] [name], [name] [name]. Date of Service: 03/29/2021 10:30 AM Medical Record Number: [id number] Patient Account Number: [id number] Date of Birth/Sex: [birth year] (80 y.o. F) Treating RN: Dolan Amen Primary Care Provider: Sela Hilding Other Clinician: Jeanine Luz Referring Provider: Sela Hilding Treating Provider/Extender: Skipper Cliche in Treatment: 1 Information Obtained from: Patient Chief Complaint Bilateral LE Ulcers Electronic Signature(s) Signed: 03/29/2021 10:41:16 AM By: Worthy Keeler PA-C Entered By: Worthy Keeler on 03/29/2021 10:41:16 Flight, [name] ([id number]) -------------------------------------------------------------------------------- HPI Details Patient Name: [name] [name]. Date of Service: 03/29/2021 10:30 AM Medical Record Number: [id number] Patient Account Number: [id number] Date of Birth/Sex: [birth year] (80 y.o. F) Treating RN: Dolan Amen Primary Care Provider: Sela Hilding Other Clinician: Jeanine Luz Referring Provider: Sela Hilding Treating Provider/Extender: Skipper Cliche in Treatment: 1 History of Present Illness HPI Description: 03/17/2021 upon evaluation today patient presents for initial inspection here in the clinic today concerning issues that she has been having with wounds over the bilateral lower extremities. Unfortunately these appear to be venous in nature and have come up fairly insidiously though they have been going on for several months. The patient does have a history of chronic venous insufficiency she has not been wearing any compression prior to this happening. With that being said she also has diabetes that she is not taking any current medications. She also has atrial fibrillation as well as being on long-term anticoagulant therapy. With that being said she is scheduled to have a cardioversion  on Monday. Her cardiologist feels like that a lot of the swelling in her leg stems from the significant amount of Cardiac dysfunction is related to the atrial fibrillation. They are hoping that the cardioversion will help this if it is successful. They are try to hold off on proceeding with any type of ablation until her legs are healed it sounds like to me. 03/29/2021 upon evaluation today patient appears to be doing well in regard to the wounds on her bilateral lower extremities. She is actually making a significant amount of progress in such a short time and a very pleased in that regard. I do think that she needs to continue to monitor for any signs of worsening as far as infection is concerned. This would often be noted obviously by increased pain or drainage right now or see none of that and overall I am extremely pleased with where things stand today. Electronic Signature(s) Signed: 03/29/2021 5:46:27 PM By: Worthy Keeler PA-C Entered By: Worthy Keeler on 03/29/2021 17:46:27 Vint, [name] ([id number]) -------------------------------------------------------------------------------- Physical Exam Details Patient Name: [name] [name]. Date of Service: 03/29/2021 10:30 AM Medical Record Number: [id number] Patient Account Number: [id number] Date of Birth/Sex: Dec 25, 1940 (80 y.o. F) Treating RN: Dolan Amen Primary Care Provider: Sela Hilding Other Clinician: Jeanine Luz Referring Provider: Sela Hilding Treating Provider/Extender: Jeri Cos Weeks in Treatment: 1 Constitutional Well-nourished and well-hydrated in no acute distress. Respiratory normal breathing without difficulty. Psychiatric this patient is able to make decisions and demonstrates good insight into disease process. Alert and Oriented x 3. pleasant and cooperative. Notes Patient's wound bed showed signs of good granulation epithelization at this point pretty much across the board her wounds are  significantly improved and I am very pleased in that regard. I do not see any evidence of worsening at this time which is excellent. Electronic Signature(s) Signed: 03/29/2021 5:46:41 PM By: Worthy Keeler PA-C Entered By: Worthy Keeler  on 03/29/2021 17:46:41 [name] [name], [name] [name] ([id number]) -------------------------------------------------------------------------------- Physician Orders Details Patient Name: TURKESSA, OSTROM. Date of Service: 03/29/2021 10:30 AM Medical Record Number: [id number] Patient Account Number: [id number] Date of Birth/Sex: 1941/12/10 (80 y.o. 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 Bathing/ Shower/ Hygiene o May shower with wound dressing protected with water repellent cover or cast protector. Edema Control - Lymphedema / Segmental Compressive Device / Other Bilateral Lower Extremities o Optional: One layer of unna paste to top of compression wrap (to act as an anchor). o 3 Layer Compression System for Lymphedema. Wound Treatment Wound #1 - Lower Leg Wound Laterality: Right, Anterior Cleanser: Soap and Water 1 x Per Week/15 Days Discharge Instructions: Gently cleanse wound with antibacterial soap, rinse and pat dry prior to dressing wounds Peri-Wound Care: Triamcinolone Acetonide Cream, 0.1%, 15  (g) tube 1 x Per Week/15 Days Discharge Instructions: Apply on reddened areas Primary Dressing: Xtrasorb Classic Super Absorbent Dressing, 6x9 (in/in) 1 x Per Week/15 Days Compression Wrap: Profore Lite LF 3 Multilayer Compression Bandaging System 1 x Per Week/15 Days Discharge Instructions: Apply 3 multi-layer wrap as prescribed. Compression Stockings: Jobst Farrow Wrap 4000 (DME) Right Leg Compression Amount: 30-40 mmHG Discharge Instructions: Apply to lower extremity as directed. Wound #2 - Lower Leg Wound Laterality: Right, Posterior Cleanser: Soap and Water 1 x Per Week/15 Days Discharge Instructions: Gently cleanse wound with antibacterial soap, rinse and pat dry prior to dressing wounds Peri-Wound Care: Triamcinolone Acetonide Cream, 0.1%, 15 (g) tube 1 x Per Week/15 Days Discharge Instructions: Apply on reddened areas Primary Dressing: Xtrasorb Classic Super Absorbent Dressing, 6x9 (in/in) 1 x Per Week/15 Days Compression Wrap: Profore Lite LF 3 Multilayer Compression Bandaging System 1 x Per Week/15 Days Discharge Instructions: Apply 3 multi-layer wrap as prescribed. Wound #3 - Lower Leg Wound Laterality: Left, Medial Cleanser: Soap and Water 1 x Per Week/15 Days Discharge Instructions: Gently cleanse wound with antibacterial soap, rinse and pat dry prior to dressing wounds Mell, Jacoby K. ([id number]) Peri-Wound Care: Triamcinolone Acetonide Cream, 0.1%, 15 (g) tube 1 x Per Week/15 Days Discharge Instructions: Apply on reddened areas Primary Dressing: Xtrasorb Classic Super Absorbent Dressing, 6x9 (in/in) 1 x Per Week/15 Days Compression Wrap: Profore Lite LF 3 Multilayer Compression Bandaging System 1 x Per Week/15 Days Discharge Instructions: Apply 3 multi-layer wrap as prescribed. Compression Stockings: Jobst Farrow Wrap 4000 (DME) Left Leg Compression Amount: 30-40 mmHG Discharge Instructions: Apply to lower extremity as directed. Wound #4 - Lower Leg Wound Laterality: Left,  Lateral Cleanser: Soap and Water 1 x Per Week/15 Days Discharge Instructions: Gently cleanse wound with antibacterial soap, rinse and pat dry prior to dressing wounds Peri-Wound Care: Triamcinolone Acetonide Cream, 0.1%, 15 (g) tube 1 x Per Week/15 Days Discharge Instructions: Apply on reddened areas Primary Dressing: Xtrasorb Classic Super Absorbent Dressing, 6x9 (in/in) 1 x Per Week/15 Days Compression Wrap: Profore Lite LF 3 Multilayer Compression Bandaging System 1 x Per Week/15 Days Discharge Instructions: Apply 3  multi-layer wrap as prescribed. Electronic Signature(s) Signed: 03/29/2021 4:59:45 PM By: Georges Mouse, Minus Breeding RN Signed: 03/29/2021 6:19:37 PM By: Worthy Keeler PA-C Entered By: Georges Mouse, Minus Breeding on 03/29/2021 12:51:25 [name] [name] ([id number]) -------------------------------------------------------------------------------- Problem List Details Patient Name: [name] [name]. Date of Service: 03/29/2021 10:30 AM Medical Record Number: [id number] Patient Account Number: [id number] Date of Birth/Sex: [birth year] (80 y.o. F) Treating RN: Dolan Amen Primary Care Provider: Sela Hilding Other Clinician: Jeanine Luz Referring Provider: Sela Hilding Treating Provider/Extender: Skipper Cliche in Treatment: 1 Active Problems ICD-10 Encounter Code Description Active Date MDM Diagnosis I87.2 Venous insufficiency (chronic) (peripheral) 03/17/2021 No Yes L97.822 Non-pressure chronic ulcer of other part of left lower leg with fat layer 03/17/2021 No Yes exposed L97.812 Non-pressure chronic ulcer of other part of right lower leg with fat layer 03/17/2021 No Yes exposed E11.622 Type 2 diabetes mellitus with other skin ulcer 03/17/2021 No Yes I48.0 Paroxysmal atrial fibrillation 03/17/2021 No Yes Z79.01 Long term (current) use of anticoagulants 03/17/2021 No Yes Inactive Problems Resolved Problems Electronic Signature(s) Signed: 03/29/2021 10:41:11 AM By: Worthy Keeler PA-C Entered By: Worthy Keeler on 03/29/2021 10:41:11 Murdoch, [name] ([id number]) -------------------------------------------------------------------------------- Progress Note Details Patient Name: [name] [name]. Date of Service: 03/29/2021 10:30 AM Medical Record Number: [id number] Patient Account Number: [id number] Date of Birth/Sex: September 15, 1941 (80 y.o. F) Treating RN: Dolan Amen Primary Care Provider: Sela Hilding Other Clinician: Jeanine Luz Referring Provider: Sela Hilding Treating Provider/Extender: Skipper Cliche in Treatment: 1 Subjective Chief Complaint Information obtained from Patient Bilateral LE Ulcers History of Present Illness (HPI) 03/17/2021 upon evaluation today patient presents for initial inspection here in the clinic today concerning issues that she has been having with wounds over the bilateral lower extremities. Unfortunately these appear to be venous in nature and have come up fairly insidiously though they have been going on for several months. The patient does have a history of chronic venous insufficiency she has not been wearing any compression prior to this happening. With that being said she also has diabetes that she is not taking any current medications. She also has atrial fibrillation as well as being on long-term anticoagulant therapy. With that being said she is scheduled to have a cardioversion on Monday. Her cardiologist feels like that a lot of the swelling in her leg stems from the significant amount of Cardiac dysfunction is related to the atrial fibrillation. They are hoping that the cardioversion will help this if it is successful. They are try to hold off on proceeding with any type of ablation until her legs are healed it sounds like to me. 03/29/2021 upon evaluation today patient appears to be doing well in regard to the wounds on her bilateral lower extremities. She is actually making a significant amount of  progress in such a short time and a very pleased in that regard. I do think that she needs to continue to monitor for any signs of worsening as far as infection is concerned. This would often be noted obviously by increased pain or drainage right now or see none of that and overall I am extremely pleased with where things stand today. Objective Constitutional Well-nourished and well-hydrated in no acute distress. Vitals Time Taken: 10:45 AM, Height: 66 in, Weight: 242 lbs, BMI: 39.1, Temperature: 98.1 F, Pulse: 50 bpm, Respiratory Rate: 18 breaths/min, Blood Pressure: 109/69 mmHg. Respiratory normal breathing without difficulty. Psychiatric this patient is able to make decisions and demonstrates good insight into disease process. Alert and Oriented  x 3. pleasant and cooperative. General Notes: Patient's wound bed showed signs of good granulation epithelization at this point pretty much across the board her wounds are significantly improved and I am very pleased in that regard. I do not see any evidence of worsening at this time which is excellent. Integumentary (Hair, Skin) Wound #1 status is Open. Original cause of wound was Blister. The date acquired was: 03/17/2021. The wound has been in treatment 1 weeks. The wound is located on the Right,Anterior Lower Leg. The wound measures 0.1cm length x 0.1cm width x 0.1cm depth; 0.008cm^2 area and 0.001cm^3 volume. There is Fat Layer (Subcutaneous Tissue) exposed. There is no tunneling or undermining noted. There is a large amount of serous drainage noted. There is large (67-100%) red granulation within the wound bed. There is no necrotic tissue within the wound bed. Wound #2 status is Open. Original cause of wound was Blister. The date acquired was: 03/17/2021. The wound has been in treatment 1 weeks. The wound is located on the Right,Posterior Lower Leg. The wound measures 0.2cm length x 0.2cm width x 0.1cm depth; 0.031cm^2 area and 0.003cm^3 volume.  There is Fat Layer (Subcutaneous Tissue) exposed. There is no tunneling or undermining noted. There is a small amount of serosanguineous drainage noted. There is large (67-100%) granulation within the wound bed. There is no necrotic tissue within the wound bed. Wound #3 status is Open. Original cause of wound was Blister. The date acquired was: 03/17/2021. The wound has been in treatment 1 weeks. The wound is located on the Left,Medial Lower Leg. The wound measures 0.1cm length x 0.1cm width x 0.1cm depth; 0.008cm^2 area and 0.001cm^3 volume. There is Fat Layer (Subcutaneous Tissue) exposed. There is no tunneling or undermining noted. There is a small amount of serosanguineous drainage noted. There is large (67-100%) granulation within the wound bed. There is no necrotic tissue within the wound bed. Wound #4 status is Open. Original cause of wound was Blister. The date acquired was: 03/17/2021. The wound has been in treatment 1 weeks. [name] [name], [name] [name] ([id number]) The wound is located on the Left,Lateral Lower Leg. The wound measures 2.5cm length x 3.2cm width x 0.1cm depth; 6.283cm^2 area and 0.628cm^3 volume. There is Fat Layer (Subcutaneous Tissue) exposed. There is no tunneling or undermining noted. There is a medium amount of serosanguineous drainage noted. There is medium (34-66%) red, pink granulation within the wound bed. There is a medium (34-66%) amount of necrotic tissue within the wound bed including Adherent Slough. 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 There was a Three Layer Compression Therapy Procedure by Dolan Amen, RN. Post procedure Diagnosis Wound #1: Same as Pre-Procedure Notes: L. 0.78 R. 0.76. Wound #2 Pre-procedure diagnosis of Wound #2 is a Venous Leg Ulcer located on the Right,Posterior Lower Leg . There was a Three Layer Compression Therapy Procedure by Dolan Amen, RN. Post procedure Diagnosis Wound #2: Same as Pre-Procedure Notes: L. 0.78 R. 0.76. Wound #3 Pre-procedure diagnosis of Wound #3 is a Venous Leg Ulcer located on the Left,Medial Lower Leg . There was a Three Layer Compression Therapy Procedure by  Dolan Amen, RN. Post procedure Diagnosis Wound #3: Same as Pre-Procedure Notes: L. 0.78 R. 0.76. Wound #4 Pre-procedure diagnosis of Wound #4 is a Venous Leg Ulcer located on the Left,Lateral Lower Leg . There was a Three Layer Compression Therapy Procedure by Dolan Amen, RN. Post procedure Diagnosis Wound #4: Same as Pre-Procedure Notes: L. 0.78 R. 0.76. Plan Follow-up Appointments: Return Appointment in 1 week. Bathing/ Shower/ Hygiene: May shower with wound dressing protected with water repellent cover or cast protector. Edema Control - Lymphedema / Segmental Compressive Device / Other: Optional: One layer of unna paste to top of compression wrap (to act as an anchor). 3 Layer Compression System for Lymphedema. WOUND #1: - Lower Leg Wound Laterality: Right, Anterior Cleanser: Soap and Water 1 x Per Week/15 Days Discharge Instructions: Gently cleanse wound with antibacterial soap, rinse and pat dry prior to dressing wounds Peri-Wound Care: Triamcinolone Acetonide Cream, 0.1%, 15 (g) tube 1 x Per Week/15 Days Discharge Instructions: Apply on reddened areas Primary Dressing: Xtrasorb Classic Super Absorbent Dressing, 6x9 (in/in) 1 x Per Week/15 Days Compression Wrap: Profore Lite LF 3 Multilayer Compression Bandaging System 1 x Per Week/15 Days Discharge Instructions: Apply 3 multi-layer  wrap as prescribed. Compression Stockings: Jobst Farrow Wrap 4000 (DME) Compression Amount: 30-40 mmHg (right) Discharge Instructions: Apply to lower extremity as directed. [name] [name], [name] [name] ([id number]) WOUND #2: - Lower Leg Wound Laterality: Right, Posterior Cleanser: Soap and Water 1 x Per Week/15 Days Discharge Instructions: Gently cleanse wound with antibacterial soap, rinse and pat dry prior to dressing wounds Peri-Wound Care: Triamcinolone Acetonide Cream, 0.1%, 15 (g) tube 1 x Per Week/15 Days Discharge Instructions: Apply on reddened areas Primary Dressing: Xtrasorb Classic Super Absorbent Dressing, 6x9 (in/in) 1 x Per Week/15 Days Compression Wrap: Profore Lite LF 3 Multilayer Compression Bandaging System 1 x Per Week/15 Days Discharge Instructions: Apply 3 multi-layer wrap as prescribed. WOUND #3: - Lower Leg Wound Laterality: Left, Medial Cleanser: Soap and Water 1 x Per Week/15 Days Discharge Instructions: Gently cleanse wound with antibacterial soap, rinse and pat dry prior to dressing wounds Peri-Wound Care: Triamcinolone Acetonide Cream, 0.1%, 15 (g) tube 1 x Per Week/15 Days Discharge Instructions: Apply on reddened areas Primary Dressing: Xtrasorb Classic Super Absorbent Dressing, 6x9 (in/in) 1 x Per Week/15 Days Compression Wrap: Profore Lite LF 3 Multilayer Compression Bandaging System 1 x Per Week/15 Days Discharge Instructions: Apply 3 multi-layer wrap as prescribed. Compression Stockings: Jobst Farrow Wrap 4000 (DME) Compression Amount: 30-40 mmHg (left) Discharge Instructions: Apply to lower extremity as directed. WOUND #4: - Lower Leg Wound Laterality: Left, Lateral Cleanser: Soap and Water 1 x Per Week/15 Days Discharge Instructions: Gently cleanse wound with antibacterial soap, rinse and pat dry prior to dressing wounds Peri-Wound Care: Triamcinolone Acetonide Cream, 0.1%, 15 (g) tube 1 x Per Week/15 Days Discharge Instructions: Apply on reddened areas Primary  Dressing: Xtrasorb Classic Super Absorbent Dressing, 6x9 (in/in) 1 x Per Week/15 Days Compression Wrap: Profore Lite LF 3 Multilayer Compression Bandaging System 1 x Per Week/15 Days Discharge Instructions: Apply 3 multi-layer wrap as prescribed. 1. Would recommend that we go ahead and continue with the wound care measures as before and the patient is in agreement with plan that includes the use of the 3 layer compression wrap which is doing a great job. 2. Also can recommend the triamcinolone to the leg followed by XtraSorb to cover the wound areas that is doing great. 3. Also can recommend that we look into ordering her Velcro compression wraps  I think this will be helpful long-term for her. The patient is in agreement with the plan. We will see patient back for reevaluation in 1 week here in the clinic. If anything worsens or changes patient will contact our office for additional recommendations. Electronic Signature(s) Signed: 03/29/2021 5:47:17 PM By: Worthy Keeler PA-C Entered By: Worthy Keeler on 03/29/2021 17:47:16 Schloss, [name] ([id number]) -------------------------------------------------------------------------------- SuperBill Details Patient Name: [name] [name]. Date of Service: 03/29/2021 Medical Record Number: [id number] Patient Account Number: [id number] Date of Birth/Sex: [birth year] (80 y.o. 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 Physician Procedures CPT4 Code: [id number] Description: 62263 - WC PHYS LEVEL 3 - EST PT Modifier: Quantity: 1 CPT4 Code: Description: ICD-10 Diagnosis Description I87.2 Venous insufficiency (chronic) (peripheral) L97.822 Non-pressure chronic ulcer of other part of left lower leg with fat lay L97.812 Non-pressure chronic ulcer of other part of right lower leg with fat la  E11.622 Type 2 diabetes mellitus with other skin ulcer Modifier: er exposed yer exposed Quantity: Electronic Signature(s) Signed: 03/29/2021 5:47:29 PM By: Worthy Keeler PA-C Previous Signature: 03/29/2021 4:59:45 PM Version By: Georges Mouse, Minus Breeding RN Entered By: Worthy Keeler on 03/29/2021 17:47:29

## 2021-04-05 ENCOUNTER — Other Ambulatory Visit: Payer: Self-pay

## 2021-04-05 ENCOUNTER — Encounter: Payer: Medicare Other | Admitting: Physician Assistant

## 2021-04-05 DIAGNOSIS — L97812 Non-pressure chronic ulcer of other part of right lower leg with fat layer exposed: Secondary | ICD-10-CM | POA: Diagnosis not present

## 2021-04-05 DIAGNOSIS — L97822 Non-pressure chronic ulcer of other part of left lower leg with fat layer exposed: Secondary | ICD-10-CM | POA: Diagnosis not present

## 2021-04-05 DIAGNOSIS — L97212 Non-pressure chronic ulcer of right calf with fat layer exposed: Secondary | ICD-10-CM | POA: Diagnosis not present

## 2021-04-05 DIAGNOSIS — I872 Venous insufficiency (chronic) (peripheral): Secondary | ICD-10-CM | POA: Diagnosis not present

## 2021-04-05 DIAGNOSIS — Z7901 Long term (current) use of anticoagulants: Secondary | ICD-10-CM | POA: Diagnosis not present

## 2021-04-05 DIAGNOSIS — I480 Paroxysmal atrial fibrillation: Secondary | ICD-10-CM | POA: Diagnosis not present

## 2021-04-05 DIAGNOSIS — E11622 Type 2 diabetes mellitus with other skin ulcer: Secondary | ICD-10-CM | POA: Diagnosis not present

## 2021-04-05 NOTE — Progress Notes (Addendum)
LEVA, BAINE ([id number]) Visit Report for 04/05/2021 Chief Complaint Document Details Patient Name: [name] [name], [name] [name]. Date of Service: 04/05/2021 8:15 AM Medical Record Number: [id number] Patient Account Number: [id number] Date of Birth/Sex: [birth year] (80 y.o. F) Treating RN: Dolan Amen Primary Care Provider: Sela Hilding Other Clinician: Jeanine Luz Referring Provider: Sela Hilding Treating Provider/Extender: Skipper Cliche in Treatment: 2 Information Obtained from: Patient Chief Complaint Bilateral LE Ulcers Electronic Signature(s) Signed: 04/05/2021 8:16:54 AM By: Worthy Keeler PA-C Entered By: Worthy Keeler on 04/05/2021 08:16:54 [name] [name] ([id number]) -------------------------------------------------------------------------------- HPI Details Patient Name: [name] [name]. Date of Service: 04/05/2021 8:15 AM Medical Record Number: [id number] Patient Account Number: [id number] Date of Birth/Sex: [birth year] (80 y.o. F) Treating RN: Dolan Amen Primary Care Provider: Sela Hilding Other Clinician: Jeanine Luz Referring Provider: Sela Hilding Treating Provider/Extender: Skipper Cliche in Treatment: 2 History of Present Illness HPI Description: 03/17/2021 upon evaluation today patient presents for initial inspection here in the clinic today concerning issues that she has been having with wounds over the bilateral lower extremities. Unfortunately these appear to be venous in nature and have come up fairly insidiously though they have been going on for several months. The patient does have a history of chronic venous insufficiency she has not been wearing any compression prior to this happening. With that being said she also has diabetes that she is not taking any current medications. She also has atrial fibrillation as well as being on long-term anticoagulant therapy. With that being said she is scheduled to have a cardioversion on Monday.  Her cardiologist feels like that a lot of the swelling in her leg stems from the significant amount of Cardiac dysfunction is related to the atrial fibrillation. They are hoping that the cardioversion will help this if it is successful. They are try to hold off on proceeding with any type of ablation until her legs are healed it sounds like to me. 03/29/2021 upon evaluation today patient appears to be doing well in regard to the wounds on her bilateral lower extremities. She is actually making a significant amount of progress in such a short time and a very pleased in that regard. I do think that she needs to continue to monitor for any signs of worsening as far as infection is concerned. This would often be noted obviously by increased pain or drainage right now or see none of that and overall I am extremely pleased with where things stand today. 04/05/2021 upon evaluation today patient appears to be doing well currently in regard to her wounds on the lower extremities. Fortunately there does not appear to be any signs of active infection at this time which is great news and in general and very pleased. I think the right leg is pretty much completely healed the left leg though not completely healed is doing significantly better which is great news. There does not appear to be any evidence of active infection at this time which is excellent as well. Electronic Signature(s) Signed: 04/05/2021 6:32:49 PM By: Worthy Keeler PA-C Entered By: Worthy Keeler on 04/05/2021 18:32:49 [name] [name], [name] [name] ([id number]) -------------------------------------------------------------------------------- Physical Exam Details Patient Name: [name] [name]. Date of Service: 04/05/2021 8:15 AM Medical Record Number: [id number] Patient Account Number: [id number] Date of Birth/Sex: 11-16-1941 (80 y.o. F) Treating RN: Dolan Amen Primary Care Provider: Sela Hilding Other Clinician: Jeanine Luz Referring Provider:  Sela Hilding Treating Provider/Extender: Jeri Cos Weeks in Treatment: 2 Constitutional Well-nourished and well-hydrated in no acute distress.  Respiratory normal breathing without difficulty. Psychiatric this patient is able to make decisions and demonstrates good insight into disease process. Alert and Oriented x 3. pleasant and cooperative. Notes Upon inspection patient's wound bed actually showed signs of good granulation epithelization at this point. I think she is making great progress and I see no need for sharp debridement. Electronic Signature(s) Signed: 04/05/2021 6:33:51 PM By: Worthy Keeler PA-C Entered By: Worthy Keeler on 04/05/2021 18:33:51 [name] [name], [name] [name] ([id number]) -------------------------------------------------------------------------------- Physician Orders Details Patient Name: [name] [name]. Date of Service: 04/05/2021 8:15 AM Medical Record Number: [id number] Patient Account Number: [id number] Date of Birth/Sex: Jan 26, 1941 (80 y.o. 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 Bathing/ Shower/ Hygiene o May shower with wound dressing protected with water repellent cover or cast protector. Edema Control - Lymphedema / Segmental Compressive Device / Other Bilateral Lower Extremities o  Optional: One layer of unna paste to top of compression wrap (to act as an anchor). o 3 Layer Compression System for Lymphedema. - Lotion and TCA on Right leg Wound Treatment Wound #3 - Lower Leg Wound Laterality: Left, Medial Cleanser: Soap and Water 1 x Per Week/15 Days Discharge Instructions: Gently cleanse wound with antibacterial soap, rinse and pat dry prior to dressing wounds Peri-Wound Care: Triamcinolone Acetonide Cream, 0.1%, 15 (g) tube 1 x Per Week/15 Days Discharge Instructions: Apply on reddened areas Primary Dressing: Xtrasorb Classic Super Absorbent Dressing, 6x9 (in/in) 1 x Per Week/15 Days Compression Wrap: Profore Lite LF 3 Multilayer Compression Bandaging System 1 x Per Week/15 Days Discharge Instructions: Apply 3 multi-layer wrap as prescribed. Wound #4 - Lower Leg Wound Laterality: Left, Lateral Cleanser: Soap and Water 1 x Per Week/15 Days Discharge Instructions: Gently cleanse wound with antibacterial soap, rinse and pat dry prior to dressing wounds Peri-Wound Care: Triamcinolone Acetonide Cream, 0.1%, 15 (g) tube 1 x Per Week/15 Days Discharge Instructions: Apply on reddened areas Primary Dressing: Xtrasorb Classic Super Absorbent Dressing, 6x9 (in/in) 1 x Per Week/15 Days Compression Wrap: Profore Lite LF 3 Multilayer Compression Bandaging System 1 x Per Week/15 Days Discharge Instructions: Apply 3 multi-layer wrap as prescribed. Electronic Signature(s) Signed: 04/05/2021 5:05:23 PM By: Georges Mouse, Minus Breeding RN Signed: 04/06/2021 12:58:56 PM By: Worthy Keeler PA-C Entered By: Georges Mouse, Minus Breeding on 04/05/2021 09:20:50 [name] [name], [name] [name] ([id number]) [name] [name], [name] [name] ([id number]) -------------------------------------------------------------------------------- Problem List Details Patient Name: [name] [name]. Date of Service: 04/05/2021 8:15 AM Medical Record Number: [id number] Patient Account Number: [id number] Date of Birth/Sex: 07/09/1941 (80 y.o. F) Treating RN:  Dolan Amen Primary Care Provider: Sela Hilding Other Clinician: Jeanine Luz Referring Provider: Sela Hilding Treating Provider/Extender: Skipper Cliche in Treatment: 2 Active Problems ICD-10 Encounter Code Description Active Date MDM Diagnosis I87.2 Venous insufficiency (chronic) (peripheral) 03/17/2021 No Yes L97.822 Non-pressure chronic ulcer of other part of left lower leg with fat layer 03/17/2021 No Yes exposed L97.812 Non-pressure chronic ulcer of other part of right lower leg with fat layer 03/17/2021  No Yes exposed E11.622 Type 2 diabetes mellitus with other skin ulcer 03/17/2021 No Yes I48.0 Paroxysmal atrial fibrillation 03/17/2021 No Yes Z79.01 Long term (current) use of anticoagulants 03/17/2021 No Yes Inactive Problems Resolved Problems Electronic Signature(s) Signed: 04/05/2021 8:16:46 AM By: Worthy Keeler PA-C Entered By: Worthy Keeler on 04/05/2021 08:16:46 [name] [name], [name] [name] ([id number]) -------------------------------------------------------------------------------- Progress Note Details Patient Name: [name] [name]. Date of Service: 04/05/2021 8:15 AM Medical Record Number: [id number] Patient Account Number: [id number] Date of Birth/Sex: 03/15/1941 (80 y.o. F) Treating RN: Dolan Amen Primary Care Provider: Sela Hilding Other Clinician: Jeanine Luz Referring Provider: Sela Hilding Treating Provider/Extender: Skipper Cliche in Treatment: 2 Subjective Chief Complaint Information obtained from Patient Bilateral LE Ulcers History of Present Illness (HPI) 03/17/2021 upon evaluation today patient presents for initial inspection here in the clinic today concerning issues that she has been having with wounds over the bilateral lower extremities. Unfortunately these appear to be venous in nature and have come up fairly insidiously though they have been going on for several months. The patient does have a history of chronic venous  insufficiency she has not been wearing any compression prior to this happening. With that being said she also has diabetes that she is not taking any current medications. She also has atrial fibrillation as well as being on long-term anticoagulant therapy. With that being said she is scheduled to have a cardioversion on Monday. Her cardiologist feels like that a lot of the swelling in her leg stems from the significant amount of Cardiac dysfunction is related to the atrial fibrillation. They are hoping that the cardioversion will help this if it is successful. They are try to hold off on proceeding with any type of ablation until her legs are healed it sounds like to me. 03/29/2021 upon evaluation today patient appears to be doing well in regard to the wounds on her bilateral lower extremities. She is actually making a significant amount of progress in such a short time and a very pleased in that regard. I do think that she needs to continue to monitor for any signs of worsening as far as infection is concerned. This would often be noted obviously by increased pain or drainage right now or see none of that and overall I am extremely pleased with where things stand today. 04/05/2021 upon evaluation today patient appears to be doing well currently in regard to her wounds on the lower extremities. Fortunately there does not appear to be any signs of active infection at this time which is great news and in general and very pleased. I think the right leg is pretty much completely healed the left leg though not completely healed is doing significantly better which is great news. There does not appear to be any evidence of active infection at this time which is excellent as well. Objective Constitutional Well-nourished and well-hydrated in no acute distress. Vitals Time Taken: 8:30 AM, Height: 66 in, Weight: 242 lbs, BMI: 39.1, Temperature: 98.2 F, Pulse: 51 bpm, Respiratory Rate: 18 breaths/min, Blood  Pressure: 128/66 mmHg. Respiratory normal breathing without difficulty. Psychiatric this patient is able to make decisions and demonstrates good insight into disease process. Alert and Oriented x 3. pleasant and cooperative. General Notes: Upon inspection patient's wound bed actually showed signs of good granulation epithelization at this point. I think she is making great progress and I see no need for sharp debridement. Integumentary (Hair, Skin) Wound #1 status is Healed - Epithelialized. Original cause of wound was Blister.  The date acquired was: 03/17/2021. The wound has been in treatment 2 weeks. The wound is located on the Right,Anterior Lower Leg. The wound measures 0cm length x 0cm width x 0cm depth; 0cm^2 area and 0cm^3 volume. There is no tunneling or undermining noted. There is a none present amount of drainage noted. There is no granulation within the wound bed. There is no necrotic tissue within the wound bed. Wound #2 status is Healed - Epithelialized. Original cause of wound was Blister. The date acquired was: 03/17/2021. The wound has been in treatment 2 weeks. The wound is located on the Right,Posterior Lower Leg. The wound measures 0cm length x 0cm width x 0cm depth; 0cm^2 area and 0cm^3 volume. There is no tunneling or undermining noted. There is a none present amount of drainage noted. There is no granulation within the wound bed. There is no necrotic tissue within the wound bed. Wound #3 status is Open. Original cause of wound was Blister. The date acquired was: 03/17/2021. The wound has been in treatment 2 weeks. [name] [name], [name] [name] ([id number]) The wound is located on the Left,Medial Lower Leg. The wound measures 0.2cm length x 0.2cm width x 0.1cm depth; 0.031cm^2 area and 0.003cm^3 volume. There is Fat Layer (Subcutaneous Tissue) exposed. There is no tunneling or undermining noted. There is a small amount of serosanguineous drainage noted. There is large (67-100%) granulation  within the wound bed. There is no necrotic tissue within the wound bed. Wound #4 status is Open. Original cause of wound was Blister. The date acquired was: 03/17/2021. The wound has been in treatment 2 weeks. The wound is located on the Left,Lateral Lower Leg. The wound measures 1.9cm length x 2cm width x 0.1cm depth; 2.985cm^2 area and 0.298cm^3 volume. There is Fat Layer (Subcutaneous Tissue) exposed. There is no tunneling or undermining noted. There is a medium amount of serosanguineous drainage noted. There is large (67-100%) red, pink granulation within the wound bed. There is a small (1-33%) amount of necrotic tissue within the wound bed including Adherent Slough. 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 There was a Three Layer Compression Therapy Procedure with a pre-treatment ABI of 0.8 by Dolan Amen, RN. Post procedure Diagnosis Wound #3: Same as Pre-Procedure Wound #4 Pre-procedure diagnosis of Wound #4 is a Venous Leg Ulcer located on the Left,Lateral Lower Leg . There was a Three Layer Compression Therapy Procedure with a pre-treatment ABI of 0.8 by Dolan Amen, RN. Post procedure Diagnosis Wound #4: Same as Pre-Procedure There was a Three Layer Compression Therapy Procedure with a pre-treatment ABI of 0.8 by Dolan Amen, RN. Post procedure Diagnosis Wound #: Same as Pre-Procedure Plan Follow-up Appointments: Return Appointment in 1 week. Bathing/ Shower/ Hygiene: May shower with wound dressing protected with water repellent cover or  cast protector. Edema Control - Lymphedema / Segmental Compressive Device / Other: Optional: One layer of unna paste to top of compression wrap (to act as an anchor). 3 Layer Compression System for Lymphedema. - Lotion and TCA on Right leg WOUND #3: - Lower Leg Wound Laterality: Left, Medial Cleanser: Soap and Water 1 x Per Week/15  Days Discharge Instructions: Gently cleanse wound with antibacterial soap, rinse and pat dry prior to dressing wounds Peri-Wound Care: Triamcinolone Acetonide Cream, 0.1%, 15 (g) tube 1 x Per Week/15 Days Discharge Instructions: Apply on reddened areas Primary Dressing: Xtrasorb Classic Super Absorbent Dressing, 6x9 (in/in) 1 x Per Week/15 Days Compression Wrap: Profore Lite LF 3 Multilayer Compression Bandaging System 1 x Per Week/15 Days Discharge Instructions: Apply 3 multi-layer wrap as prescribed. WOUND #4: - Lower Leg Wound Laterality: Left, Lateral Cleanser: Soap and Water 1 x Per Week/15 Days Discharge Instructions: Gently cleanse wound with antibacterial soap, rinse and pat dry prior to dressing wounds Peri-Wound Care: Triamcinolone Acetonide Cream, 0.1%, 15 (g) tube 1 x Per Week/15 Days Discharge Instructions: Apply on reddened areas Primary Dressing: Xtrasorb Classic Super Absorbent Dressing, 6x9 (in/in) 1 x Per Week/15 Days Advincula, Libbey K. ([id number]) Compression Wrap: Profore Lite LF 3 Multilayer Compression Bandaging System 1 x Per Week/15 Days Discharge Instructions: Apply 3 multi-layer wrap as prescribed. 1. Would recommend currently that we going to continue with the wound care measures as before and the patient is in agreement with the plan. This includes the use of the triamcinolone followed by the XtraSorb. 2. Muscle and recommend continuation of the 3 layer compression wrap. 3. I am also can recommend the patient continue to elevate her legs much as possible. Hopefully she will have her compression wraps soon as I think she is very close to  complete resolution. We will see patient back for reevaluation in 1 week here in the clinic. If anything worsens or changes patient will contact our office for additional recommendations. Electronic Signature(s) Signed: 04/05/2021 6:34:54 PM By: Worthy Keeler PA-C Entered By: Worthy Keeler on 04/05/2021 18:34:54 Trzcinski, [name] [name] ([id number]) -------------------------------------------------------------------------------- SuperBill Details Patient Name: [name] [name]. Date of Service: 04/05/2021 Medical Record Number: [id number] Patient Account Number: [id number] Date of Birth/Sex: [birth year] (80 y.o. 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 Physician Procedures CPT4 Code: [id number] Description: 78588 - WC PHYS LEVEL 3 - EST PT Modifier: Quantity: 1 CPT4 Code: Description: ICD-10 Diagnosis Description I87.2 Venous insufficiency (chronic) (peripheral) L97.822 Non-pressure chronic ulcer of other part of left lower leg with fat lay L97.812 Non-pressure chronic ulcer of other part of right lower leg with fat la  E11.622 Type 2 diabetes mellitus with other skin ulcer Modifier: er exposed yer exposed Quantity: Electronic  Signature(s) Signed: 04/05/2021 6:35:13 PM By: Worthy Keeler PA-C Previous Signature: 04/05/2021 5:05:23 PM Version By: Georges Mouse, Minus Breeding RN Entered By: Worthy Keeler on 04/05/2021 18:35:13

## 2021-04-06 NOTE — Progress Notes (Addendum)
LATEESHA, BEZOLD ([id number]) Visit Report for 04/05/2021 Arrival Information Details Patient Name: [name] [name], [name] [name]. Date of Service: 04/05/2021 8:15 AM Medical Record Number: [id number] Patient Account Number: [id number] Date of Birth/Sex: 1940-12-23 (80 y.o. F) Treating RN: Carlene Coria Primary Care Jamesyn [place]: Sela Hilding Other Clinician: Jeanine Luz Referring Berniece Abid: Sela Hilding Treating Brittiany Wiehe/Extender: Skipper Cliche in Treatment: 2 Visit Information History Since Last Visit All ordered tests and consults were completed: No Patient Arrived: Ambulatory Added or deleted any medications: No Arrival Time: 09:22 Any new allergies or adverse reactions: No Accompanied By: husband Had a fall or experienced change in No Transfer Assistance: None activities of daily living that may affect Patient Identification Verified: Yes risk of falls: Secondary Verification Process Completed: Yes Signs or symptoms of abuse/neglect since last visito No Patient Requires Transmission-Based No Hospitalized since last visit: No Precautions: Implantable device outside of the clinic excluding No Patient Has Alerts: Yes cellular tissue based products placed in the center Patient Alerts: Patient on Blood since last visit: Thinner Has Dressing in Place as Prescribed: Yes type ll diabetic Has Compression in Place as Prescribed: Yes Pain Present Now: No Electronic Signature(s) Signed: 04/13/2021 3:51:10 PM By: Carlene Coria RN Entered By: Carlene Coria on 04/05/2021 09:22:45 [name] [name] ([id number]) -------------------------------------------------------------------------------- Clinic Level of Care Assessment Details Patient Name: [name] [name]. Date of Service: 04/05/2021 8:15 AM Medical Record Number: [id number] Patient Account Number: [id number] Date of Birth/Sex: January 13, 1941 (80 y.o. F) Treating RN: Dolan Amen Primary Care Ilianna Bown: Sela Hilding Other Clinician:  Jeanine Luz Referring Demarea Lorey: Sela Hilding Treating Iden Stripling/Extender: Skipper Cliche in Treatment: 2 Clinic Level of Care Assessment Items TOOL 1 Quantity Score []  - Use when EandM and Procedure is performed on INITIAL visit 0 ASSESSMENTS - Nursing Assessment / Reassessment []  - General Physical Exam (combine w/ comprehensive assessment (listed just below) when performed on new 0 pt. evals) []  - 0 Comprehensive Assessment (HX, ROS, Risk Assessments, Wounds Hx, etc.) ASSESSMENTS - Wound and Skin Assessment / Reassessment []  - Dermatologic / Skin Assessment (not related to wound area) 0 ASSESSMENTS - Ostomy and/or Continence Assessment and Care []  - Incontinence Assessment and Management 0 []  - 0 Ostomy Care Assessment and Management (repouching, etc.) PROCESS - Coordination of Care []  - Simple Patient / Family Education for ongoing care 0 []  - 0 Complex (extensive) Patient / Family Education for ongoing care []  - 0 Staff obtains Programmer, systems, Records, Test Results / Process Orders []  - 0 Staff telephones HHA, Nursing Homes / Clarify orders / etc []  - 0 Routine Transfer to another Facility (non-emergent condition) []  - 0 Routine Hospital Admission (non-emergent condition) []  - 0 New Admissions / Biomedical engineer / Ordering NPWT, Apligraf, etc. []  - 0 Emergency Hospital Admission (emergent condition) PROCESS - Special Needs []  - Pediatric / Minor Patient Management 0 []  - 0 Isolation Patient Management []  - 0 Hearing / Language / Visual special needs []  - 0 Assessment of Community assistance (transportation, D/C planning, etc.) []  - 0 Additional assistance / Altered mentation []  - 0 Support Surface(s) Assessment (bed, cushion, seat, etc.) INTERVENTIONS - Miscellaneous []  - External ear exam 0 []  - 0 Patient Transfer (multiple staff / Civil Service fast streamer / Similar devices) []  - 0 Simple Staple / Suture removal (25 or less) []  - 0 Complex Staple / Suture  removal (26 or more) []  - 0 Hypo/Hyperglycemic Management (do not check if billed separately) []  - 0 Ankle / Brachial Index (ABI) - do not check if  billed separately Has the patient been seen at the hospital within the last three years: Yes Total Score: 0 Level Of Care: ____ [name] [name] ([id number]) Electronic Signature(s) Signed: 04/05/2021 5:05:23 PM By: Georges Mouse, Minus Breeding RN Entered By: Georges Mouse, Kenia on 04/05/2021 09:20:55 [name] [name] ([id number]) -------------------------------------------------------------------------------- Compression Therapy Details Patient Name: [name] [name]. Date of Service: 04/05/2021 8:15 AM Medical Record Number: [id number] Patient Account Number: [id number] Date of Birth/Sex: Dec 24, 1940 (80 y.o. F) Treating RN: Dolan Amen Primary Care Nikash Mortensen: Sela Hilding Other Clinician: Jeanine Luz Referring Kashari Chalmers: Sela Hilding Treating Athalie Newhard/Extender: Jeri Cos Weeks in Treatment: 2 Compression Therapy Performed for Wound Assessment: Wound #3 Left,Medial Lower Leg Performed By: Clinician Dolan Amen, RN Compression Type: Three Layer Pre Treatment ABI: 0.8 Post Procedure Diagnosis Same as Pre-procedure Electronic Signature(s) Signed: 04/05/2021 5:05:23 PM By: Georges Mouse, Minus Breeding RN Entered By: Georges Mouse, Kenia on 04/05/2021 09:19:28 [name] [name] ([id number]) -------------------------------------------------------------------------------- Compression Therapy Details Patient Name: [name] [name]. Date of Service: 04/05/2021 8:15 AM Medical Record Number: [id number] Patient Account Number: [id number] Date of Birth/Sex: Aug 24, 1941 (80 y.o. F) Treating RN: Dolan Amen Primary Care Sinai Illingworth: Sela Hilding Other Clinician: Jeanine Luz Referring Demetres Prochnow: Sela Hilding Treating Daniela Hernan/Extender: Jeri Cos Weeks in Treatment: 2 Compression Therapy Performed for Wound Assessment: Wound #4  Left,Lateral Lower Leg Performed By: Clinician Dolan Amen, RN Compression Type: Three Layer Pre Treatment ABI: 0.8 Post Procedure Diagnosis Same as Pre-procedure Electronic Signature(s) Signed: 04/05/2021 5:05:23 PM By: Georges Mouse, Minus Breeding RN Entered By: Georges Mouse, Kenia on 04/05/2021 09:19:28 [name] [name] ([id number]) -------------------------------------------------------------------------------- Compression Therapy Details Patient Name: [name] [name]. Date of Service: 04/05/2021 8:15 AM Medical Record Number: [id number] Patient Account Number: [id number] Date of Birth/Sex: 1941-11-27 (80 y.o. F) Treating RN: Dolan Amen Primary Care Vahan Wadsworth: Sela Hilding Other Clinician: Jeanine Luz Referring Yatzil Clippinger: Sela Hilding Treating Asser Lucena/Extender: Skipper Cliche in Treatment: 2 Compression Therapy Performed for Wound Assessment: Non-Wound Location Performed By: Clinician Dolan Amen, RN Compression Type: Three Layer Pre Treatment ABI: 0.8 Location: Lower Extremity, Right Post Procedure Diagnosis Same as Pre-procedure Electronic Signature(s) Signed: 04/05/2021 5:05:23 PM By: Georges Mouse, Minus Breeding RN Entered By: Georges Mouse, Kenia on 04/05/2021 09:19:44 [name] [name] ([id number]) -------------------------------------------------------------------------------- Encounter Discharge Information Details Patient Name: [name] [name]. Date of Service: 04/05/2021 8:15 AM Medical Record Number: [id number] Patient Account Number: [id number] Date of Birth/Sex: [birth year] (80 y.o. F) Treating RN: Dolan Amen Primary Care Keerstin Bjelland: Sela Hilding Other Clinician: Jeanine Luz Referring Seraphim Trow: Sela Hilding Treating Khyren Hing/Extender: Skipper Cliche in Treatment: 2 Encounter Discharge Information Items Discharge Condition: Stable Ambulatory Status: Ambulatory Discharge Destination: Home Transportation: Private Auto Accompanied  By: husband Schedule Follow-up Appointment: Yes Clinical Summary of Care: Electronic Signature(s) Signed: 04/05/2021 1:04:49 PM By: Jeanine Luz Entered By: Jeanine Luz on 04/05/2021 09:50:33 Gebhart, [name] ([id number]) -------------------------------------------------------------------------------- Lower Extremity Assessment Details Patient Name: [name] [name]. Date of Service: 04/05/2021 8:15 AM Medical Record Number: [id number] Patient Account Number: [id number] Date of Birth/Sex: Feb 10, 1941 (80 y.o. F) Treating RN: Carlene Coria Primary Care Ferdinand Revoir: Sela Hilding Other Clinician: Jeanine Luz Referring Kerilyn Cortner: Sela Hilding Treating Aylanie Cubillos/Extender: Jeri Cos Weeks in Treatment: 2 Edema Assessment Assessed: [Left: No] [Right: No] Edema: [Left: Ye] [Right: s] Calf Left: Right: Point of Measurement: 35 cm From Medial Instep 41 cm Ankle Left: Right: Point of Measurement: 9 cm From Medial Instep 23 cm Vascular Assessment Pulses: Dorsalis Pedis Palpable: [Left:Yes] Electronic Signature(s) Signed: 04/13/2021 3:51:10 PM By: Carlene Coria RN Entered By: Carlene Coria on 04/05/2021  08:43:14 LATAYA, VARNELL ([id number]) -------------------------------------------------------------------------------- Multi Wound Chart Details Patient Name: XANDREA, CLAREY. Date of Service: 04/05/2021 8:15 AM Medical Record Number: [id number] Patient Account Number: [id number] Date of Birth/Sex: 1941/02/21 (80 y.o. 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 Comorbid History: Cataracts, Hypertension, Type II Cataracts, Hypertension, Type II Cataracts, Hypertension, Type II Diabetes, Rheumatoid Arthritis Diabetes, Rheumatoid Arthritis Diabetes, Rheumatoid Arthritis Date Acquired: 03/17/2021 03/17/2021 03/17/2021 Weeks of Treatment: 2 2 2  Wound Status: Healed - Epithelialized Healed - Epithelialized Open Clustered Wound: Yes No No Clustered Quantity: 4 N/A N/A Measurements L x W x D (cm) 0x0x0 0x0x0 0.2x0.2x0.1 Area (cm) : 0 0 0.031 Volume (cm) : 0 0 0.003 % Reduction in Area: 100.00% 100.00% 99.90% % Reduction in Volume: 100.00% 100.00% 99.90% Classification: Full Thickness Without Exposed Full Thickness Without Exposed Full Thickness Without Exposed Support Structures Support Structures Support Structures Exudate Amount: None Present None Present Small Exudate Type: N/A N/A Serosanguineous Exudate Color: N/A N/A red, brown Granulation Amount: None Present (0%) None Present (0%) Large (67-100%) Granulation Quality: N/A N/A N/A Necrotic Amount: None Present (0%) None Present (0%) None Present (0%) Exposed Structures: Fascia: No Fascia: No Fat Layer (Subcutaneous Tissue): Fat Layer (Subcutaneous Tissue): Fat Layer (Subcutaneous Tissue): Yes No No Fascia: No Tendon: No Tendon: No Tendon: No Muscle: No Muscle: No Muscle: No Joint: No Joint: No Joint: No Bone: No Bone: No Bone: No Epithelialization: Large (67-100%) Large (67-100%) Large (67-100%) Wound Number: 4 N/A N/A Photos: N/A N/A TALYNN, LEBON (Basil Dess) Wound Location: Left, Lateral Lower Leg N/A N/A Wounding Event: Blister N/A N/A Primary Etiology: Venous Leg Ulcer N/A N/A Comorbid History: Cataracts, Hypertension, Type II N/A N/A Diabetes, Rheumatoid Arthritis Date Acquired: 03/17/2021 N/A N/A Weeks of Treatment: 2 N/A N/A Wound Status: Open N/A  N/A Clustered Wound: No N/A N/A Clustered Quantity: N/A N/A N/A Measurements L x W x D (cm) 1.9x2x0.1 N/A N/A Area (cm) : 2.985 N/A N/A Volume (cm) : 0.298 N/A N/A % Reduction in Area: 70.70% N/A N/A % Reduction in Volume: 70.70% N/A N/A Classification: Full Thickness Without Exposed N/A N/A Support Structures Exudate Amount: Medium N/A N/A Exudate Type: Serosanguineous N/A N/A Exudate Color: red, brown N/A N/A Granulation Amount: Large (67-100%) N/A N/A Granulation Quality: Red, Pink N/A N/A Necrotic Amount: Small (1-33%) N/A N/A Exposed Structures: Fat Layer (Subcutaneous Tissue): N/A N/A Yes Fascia: No Tendon: No Muscle: No Joint: No Bone: No Epithelialization: Medium (34-66%) N/A N/A Treatment Notes Electronic Signature(s) Signed: 04/05/2021 5:05:23 PM By: 04/07/2021, Phillis Haggis RN Entered By: Dondra Prader, Phillis Haggis on 04/05/2021 09:19:08 04/07/2021 (Basil Dess) -------------------------------------------------------------------------------- Multi-Disciplinary Care Plan Details Patient Name: [id number]. Date of Service: 04/05/2021 8:15 AM Medical Record Number: 04/07/2021 Patient Account Number: [id number] Date of Birth/Sex: May 22, 1941 (80 y.o. F) Treating RN: (66 Primary Care Jadakiss Barish: Rogers Blocker Other Clinician: Garth Bigness Referring Jaylia Pettus: Lolita Cram Treating Taura Lamarre/Extender: Garth Bigness in Treatment: 2 Active Inactive Venous Leg Ulcer Nursing Diagnoses: Actual venous Insuffiency (use after diagnosis is confirmed) Goals: Patient will maintain optimal edema control Date Initiated: 03/17/2021 Target Resolution  Date: 04/16/2021 Goal Status: Active Patient/caregiver will verbalize understanding of disease process and disease management Date Initiated: 03/17/2021 Target Resolution Date: 03/17/2021 Goal Status: Active Verify adequate tissue perfusion prior to therapeutic compression application Date Initiated:  03/17/2021 Target Resolution Date: 03/17/2021 Goal Status: Active Interventions: Assess peripheral edema status every visit. Compression as ordered Provide education on venous insufficiency Treatment Activities: Therapeutic compression applied : 03/17/2021 Notes: Wound/Skin Impairment Nursing Diagnoses: Impaired tissue integrity Goals: Patient/caregiver will verbalize understanding of skin care regimen Date Initiated: 03/17/2021 Date Inactivated: 03/29/2021 Target Resolution Date: 03/17/2021 Goal Status: Met Ulcer/skin breakdown will have a volume reduction of 30% by week 4 Date Initiated: 03/17/2021 Date Inactivated: 04/05/2021 Target Resolution Date: 04/16/2021 Goal Status: Met Ulcer/skin breakdown will have a volume reduction of 50% by week 8 Date Initiated: 03/17/2021 Target Resolution Date: 05/16/2021 Goal Status: Active Ulcer/skin breakdown will have a volume reduction of 80% by week 12 Date Initiated: 03/17/2021 Target Resolution Date: 06/16/2021 Goal Status: Active Ulcer/skin breakdown will heal within 14 weeks Date Initiated: 03/17/2021 Target Resolution Date: 07/16/2021 Goal Status: Active Interventions: Assess patient/caregiver ability to obtain necessary supplies Assess patient/caregiver ability to perform ulcer/skin care regimen upon admission and as needed Assess ulceration(s) every visit Provide education on ulcer and skin care SOFI, BRYARS ([id number]) Treatment Activities: Referred to DME Karthika Glasper for dressing supplies : 03/17/2021 Skin care regimen initiated : 03/17/2021 Notes: Electronic Signature(s) Signed: 04/05/2021 5:05:23 PM By: Georges Mouse, Minus Breeding RN Entered By: Georges Mouse, Kenia on 04/05/2021 09:19:01 Michiels, [name] ([id number]) -------------------------------------------------------------------------------- Pain Assessment Details Patient Name: [name] [name]. Date of Service: 04/05/2021 8:15 AM Medical Record Number: [id number] Patient Account  Number: [id number] Date of Birth/Sex: [birth year] (80 y.o. F) Treating RN: Carlene Coria Primary Care Monterio Bob: Sela Hilding Other Clinician: Jeanine Luz Referring Jeffree Cazeau: Sela Hilding Treating Ramondo Dietze/Extender: Skipper Cliche in Treatment: 2 Active Problems Location of Pain Severity and Description of Pain Patient Has Paino No Site Locations Pain Management and Medication Current Pain Management: Electronic Signature(s) Signed: 04/13/2021 3:51:10 PM By: Carlene Coria RN Entered By: Carlene Coria on 04/05/2021 08:39:59 Hodes, [name] ([id number]) -------------------------------------------------------------------------------- Patient/Caregiver Education Details Patient Name: [name] [name]. Date of Service: 04/05/2021 8:15 AM Medical Record Number: [id number] Patient Account Number: [id number] Date of Birth/Gender: 04-Jan-1941 (80 y.o. F) Treating RN: Dolan Amen Primary Care Physician: Sela Hilding Other Clinician: Jeanine Luz Referring Physician: Sela Hilding Treating Physician/Extender: Skipper Cliche in Treatment: 2 Education Assessment Education Provided To: Patient Education Topics Provided Notes Compression stockings education Electronic Signature(s) Signed: 04/05/2021 5:05:23 PM By: Georges Mouse, Minus Breeding RN Entered By: Georges Mouse, Minus Breeding on 04/05/2021 09:21:21 [name] [name] ([id number]) -------------------------------------------------------------------------------- Wound Assessment Details Patient Name: [name] [name]. Date of Service: 04/05/2021 8:15 AM Medical Record Number: [id number] Patient Account Number: [id number] Date of Birth/Sex: 24-Dec-1940 (80 y.o. F) Treating RN: Carlene Coria Primary Care Kanai Berrios: Sela Hilding Other Clinician: Jeanine Luz Referring Kelechi Orgeron: Sela Hilding Treating Courtenay Creger/Extender: Skipper Cliche in Treatment: 2 Wound Status Wound Number: 1 Primary Venous Leg  Ulcer Etiology: Wound Location: Right, Anterior Lower Leg Wound Status: Healed - Epithelialized Wounding Event: Blister Comorbid Cataracts, Hypertension, Type II Diabetes, Date Acquired: 03/17/2021 History: Rheumatoid Arthritis Weeks Of Treatment: 2 Clustered Wound: Yes Photos Wound Measurements Length: (cm) 0 Width: (cm) 0 Depth: (cm) 0 Clustered Quantity: 4 Area: (cm) Volume: (cm) % Reduction in Area: 100% % Reduction in Volume: 100% Epithelialization: Large (67-100%) Tunneling: No 0 Undermining: No 0 Wound Description Classification: Full Thickness Without Exposed Support Structure Exudate Amount: None  Present s Foul Odor After Cleansing: No Slough/Fibrino No Wound Bed Granulation Amount: None Present (0%) Exposed Structure Necrotic Amount: None Present (0%) Fascia Exposed: No Fat Layer (Subcutaneous Tissue) Exposed: No Tendon Exposed: No Muscle Exposed: No Joint Exposed: No Bone Exposed: No Electronic Signature(s) Signed: 04/05/2021 5:05:23 PM By: Georges Mouse, Minus Breeding RN Signed: 04/13/2021 3:51:10 PM By: Carlene Coria RN Entered By: Georges Mouse, Minus Breeding on 04/05/2021 09:18:19 Learn, [name] ([id number]) -------------------------------------------------------------------------------- Wound Assessment Details Patient Name: [name] [name]. Date of Service: 04/05/2021 8:15 AM Medical Record Number: [id number] Patient Account Number: [id number] Date of Birth/Sex: 1941/10/06 (80 y.o. F) Treating RN: Carlene Coria Primary Care Lynisha Osuch: Sela Hilding Other Clinician: Jeanine Luz Referring Baby Stairs: Sela Hilding Treating Lovada Barwick/Extender: Skipper Cliche in Treatment: 2 Wound Status Wound Number: 2 Primary Venous Leg Ulcer Etiology: Wound Location: Right, Posterior Lower Leg Wound Status: Healed - Epithelialized Wounding Event: Blister Comorbid Cataracts, Hypertension, Type II Diabetes, Date Acquired: 03/17/2021 History: Rheumatoid  Arthritis Weeks Of Treatment: 2 Clustered Wound: No Photos Wound Measurements Length: (cm) 0 Width: (cm) 0 Depth: (cm) 0 Area: (cm) 0 Volume: (cm) 0 % Reduction in Area: 100% % Reduction in Volume: 100% Epithelialization: Large (67-100%) Tunneling: No Undermining: No Wound Description Classification: Full Thickness Without Exposed Support Structure Exudate Amount: None Present s Foul Odor After Cleansing: No Slough/Fibrino No Wound Bed Granulation Amount: None Present (0%) Exposed Structure Necrotic Amount: None Present (0%) Fascia Exposed: No Fat Layer (Subcutaneous Tissue) Exposed: No Tendon Exposed: No Muscle Exposed: No Joint Exposed: No Bone Exposed: No Electronic Signature(s) Signed: 04/05/2021 5:05:23 PM By: Georges Mouse, Minus Breeding RN Signed: 04/13/2021 3:51:10 PM By: Carlene Coria RN Entered By: Georges Mouse, Minus Breeding on 04/05/2021 09:18:46 Hassan, [name] ([id number]) -------------------------------------------------------------------------------- Wound Assessment Details Patient Name: [name] [name]. Date of Service: 04/05/2021 8:15 AM Medical Record Number: [id number] Patient Account Number: [id number] Date of Birth/Sex: [birth year] (80 y.o. F) Treating RN: Carlene Coria Primary Care Aunesty Tyson: Sela Hilding Other Clinician: Jeanine Luz Referring Elva Breaker: Sela Hilding Treating Agastya Meister/Extender: Skipper Cliche in Treatment: 2 Wound Status Wound Number: 3 Primary Venous Leg Ulcer Etiology: Wound Location: Left, Medial Lower Leg Wound Status: Open Wounding Event: Blister Comorbid Cataracts, Hypertension, Type II Diabetes, Date Acquired: 03/17/2021 History: Rheumatoid Arthritis Weeks Of Treatment: 2 Clustered Wound: No Photos Wound Measurements Length: (cm) 0.2 Width: (cm) 0.2 Depth: (cm) 0.1 Area: (cm) 0.031 Volume: (cm) 0.003 % Reduction in Area: 99.9% % Reduction in Volume: 99.9% Epithelialization: Large (67-100%) Tunneling:  No Undermining: No Wound Description Classification: Full Thickness Without Exposed Support Structu Exudate Amount: Small Exudate Type: Serosanguineous Exudate Color: red, brown res Foul Odor After Cleansing: No Slough/Fibrino No Wound Bed Granulation Amount: Large (67-100%) Exposed Structure Necrotic Amount: None Present (0%) Fascia Exposed: No Fat Layer (Subcutaneous Tissue) Exposed: Yes Tendon Exposed: No Muscle Exposed: No Joint Exposed: No Bone Exposed: No Treatment Notes Wound #3 (Lower Leg) Wound Laterality: Left, Medial Cleanser Soap and Water Discharge Instruction: Gently cleanse wound with antibacterial soap, rinse and pat dry prior to dressing wounds Peri-Wound Care Triamcinolone Acetonide Cream, 0.1%, 15 (g) tube Scherger, Teela K. ([id number]) Discharge Instruction: Apply on reddened areas Topical Primary Dressing Xtrasorb Classic Super Absorbent Dressing, 6x9 (in/in) Secondary Dressing Secured With Compression Wrap Profore Lite LF 3 Multilayer Compression Bandaging System Discharge Instruction: Apply 3 multi-layer wrap as prescribed. Compression Stockings Add-Ons Electronic Signature(s) Signed: 04/13/2021 3:51:10 PM By: Carlene Coria RN Entered By: Carlene Coria on 04/05/2021 08:41:57 [name] [name] ([id number]) -------------------------------------------------------------------------------- Wound Assessment Details Patient Name:  Schroader, Ziva K. Date of Service: 04/05/2021 8:15 AM Medical Record Number: [id number] Patient Account Number: [id number] Date of Birth/Sex: [birth year] (80 y.o. F) Treating RN: Carlene Coria Primary Care Sharilynn Cassity: Sela Hilding Other Clinician: Jeanine Luz Referring Nyshaun Standage: Sela Hilding Treating Marne Meline/Extender: Skipper Cliche in Treatment: 2 Wound Status Wound Number: 4 Primary Venous Leg Ulcer Etiology: Wound Location: Left, Lateral Lower Leg Wound Status: Open Wounding Event: Blister Comorbid Cataracts,  Hypertension, Type II Diabetes, Date Acquired: 03/17/2021 History: Rheumatoid Arthritis Weeks Of Treatment: 2 Clustered Wound: No Photos Wound Measurements Length: (cm) 1.9 Width: (cm) 2 Depth: (cm) 0.1 Area: (cm) 2.985 Volume: (cm) 0.298 % Reduction in Area: 70.7% % Reduction in Volume: 70.7% Epithelialization: Medium (34-66%) Tunneling: No Undermining: No Wound Description Classification: Full Thickness Without Exposed Support Structu Exudate Amount: Medium Exudate Type: Serosanguineous Exudate Color: red, brown res Foul Odor After Cleansing: No Slough/Fibrino Yes Wound Bed Granulation Amount: Large (67-100%) Exposed Structure Granulation Quality: Red, Pink Fascia Exposed: No Necrotic Amount: Small (1-33%) Fat Layer (Subcutaneous Tissue) Exposed: Yes Necrotic Quality: Adherent Slough Tendon Exposed: No Muscle Exposed: No Joint Exposed: No Bone Exposed: No Treatment Notes Wound #4 (Lower Leg) Wound Laterality: Left, Lateral Cleanser Soap and Water Discharge Instruction: Gently cleanse wound with antibacterial soap, rinse and pat dry prior to dressing wounds [facility] Triamcinolone Acetonide Cream, 0.1%, 15 (g) tube Boatman, Peggy K. ([id number]) Discharge Instruction: Apply on reddened areas Topical Primary Dressing Xtrasorb Classic Super Absorbent Dressing, 6x9 (in/in) Secondary Dressing Secured With Compression Wrap Profore Lite LF 3 Multilayer Compression Bandaging System Discharge Instruction: Apply 3 multi-layer wrap as prescribed. Compression Stockings Add-Ons Electronic Signature(s) Signed: 04/13/2021 3:51:10 PM By: Carlene Coria RN Entered By: Carlene Coria on 04/05/2021 08:42:28 [name] [name] ([id number]) -------------------------------------------------------------------------------- Vitals Details Patient Name: [name] [name]. Date of Service: 04/05/2021 8:15 AM Medical Record Number: [id number] Patient Account Number: [id number] Date of Birth/Sex:  08/07/1941 (80 y.o. F) Treating RN: Carlene Coria Primary Care Venessa Wickham: Sela Hilding Other Clinician: Jeanine Luz Referring Zema Lizardo: Sela Hilding Treating Aliviana Burdell/Extender: Skipper Cliche in Treatment: 2 Vital Signs Time Taken: 08:30 Temperature (F): 98.2 Height (in): 66 Pulse (bpm): 51 Weight (lbs): 242 Respiratory Rate (breaths/min): 18 Body Mass Index (BMI): 39.1 Blood Pressure (mmHg): 128/66 Reference Range: 80 - 120 mg / dl Electronic Signature(s) Signed: 04/13/2021 3:51:10 PM By: Carlene Coria RN Entered By: Carlene Coria on 04/05/2021 08:39:53

## 2021-04-07 ENCOUNTER — Encounter: Payer: Self-pay | Admitting: Cardiovascular Disease

## 2021-04-07 ENCOUNTER — Ambulatory Visit: Payer: Medicare Other | Admitting: Cardiovascular Disease

## 2021-04-07 ENCOUNTER — Other Ambulatory Visit: Payer: Self-pay

## 2021-04-07 VITALS — BP 140/60 | HR 58 | Ht 66.0 in | Wt 243.4 lb

## 2021-04-07 DIAGNOSIS — I350 Nonrheumatic aortic (valve) stenosis: Secondary | ICD-10-CM

## 2021-04-07 NOTE — Patient Instructions (Addendum)
Medication Instructions:  No changes today  Lab Work: none  Testing/Procedures: Your physician has requested that you have an echocardiogram. Echocardiography is a painless test that uses sound waves to create images of your heart. It provides your doctor with information about the size and shape of your heart and how well your heart's chambers and valves are working. This procedure takes approximately one hour. There are no restrictions for this procedure.   Follow-Up: At [facility], you and your health needs are our priority.  As part of our continuing mission to provide you with exceptional heart care, we have created designated Provider Care Teams.  These Care Teams include your primary Cardiologist (physician) and Advanced Practice Providers (APPs -  Physician Assistants and Nurse Practitioners) who all work together to provide you with the care you need, when you need it.   Your next appointment:   6 month(s) -after echocardiogram  The format for your next appointment:   In Person  Provider:   Lauree Chandler, MD

## 2021-04-07 NOTE — Progress Notes (Signed)
Structural Heart Clinic Consult Note  Chief Complaint  Patient presents with  . New Patient (Initial Visit)    Aortic stenosis   History of Present Illness: 80 yo female with history of arthritis, HTN, diet controlled diabetes, persistent atrial fibrillation, mild CAD, GERD and moderate aortic stenosis who is here today as a new consult, referred by Dr. Margaretann Loveless, for further evaluation of her aortic stenosis. She has been followed in the atrial fibrillation clinic for paroxysmal atrial fibrillation. She has been on amiodarone and Eliquis. Cardiac cath June 2021 with mild non-obstructive CAD. Normal LV filling pressures. She has been followed for moderate aortic stenosis since June 2021. Echo 03/29/21 with LVEF=50-55%. Normal RV function. Mild mitral regurgitation and mild mitral stenosis. The aortic valve leaflets are thickened and calcified. Mean gradient 26 mmHg, peak gradient 40 mmHg, AVA 0.8-0.9 cm2, dimensionless index 0.29, SVI 29. This is consistent with moderate aortic stenosis with slight progression since the echo in June 2021. She has had ongoing issues with fluid retention and is now on Torsemide dialy. She is followed in the wound clinic in [place]. She is wearing Una boots. She has had issues with kidney stones. She has had an ablation for renal cell carcinoma.    She tells me today that she is feeling much better since being on the diuretic. No dyspnea or fatigue. She has no chest pain. She has had some neck pain. Her lower extremity edema is much improved. She continues to follow in the wound clinic. Her legs are wrapped today. She does not go to the dentist. She has one tooth that his issues. She lives in [place] with her husband and is a retired Theme park manager.   Primary Care Physician: Glenis Smoker, MD Primary Cardiologist: Margaretann Loveless Referring Cardiologist: Margaretann Loveless  Past Medical History:  Diagnosis Date  . Anemia    hx of  . Arthritis    knee and shoulders  . Atrial  fibrillation ([place])   . Carpal tunnel syndrome on right   . Coronary artery disease 05/2020   Minimal, nobstructive CAD. Tortuous cororany arteries.  . Diabetes mellitus without complication (HCC)    diet controlled no meds in 5-6 yrs  . Dyspnea    on exertion   . Dysrhythmia 05/2020   afib  . GERD (gastroesophageal reflux disease)    No longer taking medications  . Hematuria   . History of hypothyroidism 30 yrs ago  . History of kidney stones   . Hypertension   . Lower extremity edema    chronic lower extremity edema   . Myocardial infarction [facility])    patient mention questionable MI  . Renal mass     Past Surgical History:  Procedure Laterality Date  . ABDOMINAL HYSTERECTOMY  1992   complete  . BILATERAL CARPAL TUNNEL RELEASE  yrs ago  . CARDIOVERSION N/A 09/23/2020   Procedure: CARDIOVERSION;  Surgeon: Freada Bergeron, MD;  Location: [facility] ENDOSCOPY;  Service: Cardiovascular;  Laterality: N/A;  . CARDIOVERSION N/A 03/21/2021   Procedure: CARDIOVERSION;  Surgeon: Werner Lean, MD;  Location: [facility] ENDOSCOPY;  Service: Cardiovascular;  Laterality: N/A;  . CHOLECYSTECTOMY  1996  . CYSTOSCOPY/URETEROSCOPY/HOLMIUM LASER/STENT PLACEMENT Bilateral 08/19/2020   Procedure: CYSTOSCOPY BILATERAL  RETROGRADE  URETEROSCOPY/HOLMIUM LASER/STENT PLACEMENT;  Surgeon: Irine Seal, MD;  Location: [facility];  Service: Urology;  Laterality: Bilateral;  . HAMMER TOE SURGERY Right yrs ago  . IR RADIOLOGIST EVAL & MGMT  10/26/2020  . IR RADIOLOGIST EVAL & MGMT  01/06/2021  . IR RADIOLOGIST EVAL & MGMT  03/22/2021  . LEFT HEART CATH AND CORONARY ANGIOGRAPHY N/A 06/02/2020   Procedure: LEFT HEART CATH AND CORONARY ANGIOGRAPHY;  Surgeon: Jettie Booze, MD;  Location: [place] CV LAB;  Service: Cardiovascular;  Laterality: N/A;  . RADIOLOGY WITH ANESTHESIA N/A 11/17/2020   Procedure: RADIOLOGY WITH ANESTHESIA RENAL MICROWAVE ABLATION;  Surgeon: Suzette Battiest, MD;  Location:  [facility] ORS;  Service: Radiology;  Laterality: N/A;  . TONSILLECTOMY  age 89    Current Outpatient Medications  Medication Sig Dispense Refill  . acetaminophen (TYLENOL) 500 MG tablet Take 1,000 mg by mouth every 6 (six) hours as needed for moderate pain or headache.    [name] amiodarone (PACERONE) 200 MG tablet Take 1 tablet (200 mg total) by mouth daily. 120 tablet 3  . apixaban (ELIQUIS) 5 MG TABS tablet Take 1 tablet (5 mg total) by mouth 2 (two) times daily. 60 tablet 6  . Calcium Carb-Cholecalciferol (CALCIUM 600+D) 600-800 MG-UNIT TABS Take 1 tablet by mouth every evening.     [name] Hydrocortisone-Aloe Vera (HUDJSHFWY-63/ZCHY EX) Apply 1 application topically 2 (two) times daily as needed (itching).    . Liniments (BLUE-EMU SUPER STRENGTH) CREA Apply 1 application topically 2 (two) times daily as needed (pain.).     [name] loperamide (IMODIUM A-D) 2 MG tablet Take 2-4 mg by mouth as needed for diarrhea or loose stools.     . metoprolol tartrate (LOPRESSOR) 25 MG tablet Take 1.5 tablets (37.5 mg total) by mouth 2 (two) times daily. 90 tablet 3  . Multiple Vitamin (MULTIVITAMIN WITH MINERALS) TABS tablet Take 1 tablet by mouth daily.    . Multiple Vitamins-Minerals (PRESERVISION AREDS 2 PO) Take 1 capsule by mouth 2 (two) times daily.     . Potassium Chloride ER 20 MEQ TBCR Take 40 mEq by mouth daily. 60 tablet 3  . torsemide (DEMADEX) 20 MG tablet Take 1 tablet (20 mg total) by mouth 2 (two) times daily. 60 tablet 3   No current facility-administered medications for this visit.    Allergies  Allergen Reactions  . Tetanus-Diphtheria Toxoids Td Other (See Comments)    Felt very sick  . Cardizem [Diltiazem] Rash    Social History   Socioeconomic History  . Marital status: Married    Spouse name: Not on file  . Number of children: 5  . Years of education: Not on file  . Highest education level: Not on file  Occupational History  . Occupation: Retired-pastor  Tobacco Use  . Smoking status: Never  Smoker  . Smokeless tobacco: Never Used  Vaping Use  . Vaping Use: Never used  Substance and Sexual Activity  . Alcohol use: Not Currently  . Drug use: Never  . Sexual activity: Not Currently    Birth control/protection: Surgical  Other Topics Concern  . Not on file  Social History Narrative  . Not on file   Social Determinants of Health   Financial Resource Strain: Not on file  Food Insecurity: Not on file  Transportation Needs: Not on file  Physical Activity: Not on file  Stress: Not on file  Social Connections: Not on file  Intimate Partner Violence: Not on file    Family History  Problem Relation Age of Onset  . Hypertension Mother   . Hypertension Father   . Diabetes Paternal Grandmother     Review of Systems:  As stated in the HPI and otherwise negative.   BP 140/60  Pulse (!) 58   Ht 5\' 6"  (1.676 m)   Wt 243 lb 6.4 oz (110.4 kg)   SpO2 98%   BMI 39.29 kg/m   Physical Examination: General: Well developed, well nourished, NAD  HEENT: OP clear, mucus membranes moist  SKIN: warm, dry. No rashes. Neuro: No focal deficits  Musculoskeletal: Muscle strength 5/5 all ext  Psychiatric: Mood and affect normal  Neck: No JVD, no carotid bruits, no thyromegaly, no lymphadenopathy.  Lungs:Clear bilaterally, no wheezes, rhonci, crackles Cardiovascular: Regular rate and rhythm. Loud, harsh, late peaking systolic murmur.  Abdomen:Soft. Bowel sounds present. Non-tender.  Extremities: Minimal bilateral lower extremity edema. Both legs are wrapped.   EKG:  EKG is not ordered today. The ekg from 03/28/21 is reviewed and show sinus brady, PVCs  Echo 03/28/21: 1. Left ventricular ejection fraction, by estimation, is 50 to 55%. The  left ventricle has low normal function. The left ventricle has no regional  wall motion abnormalities. There is mild concentric left ventricular  hypertrophy. Left ventricular  diastolic parameters are consistent with Grade II diastolic  dysfunction  (pseudonormalization). Elevated left atrial pressure.  2. Right ventricular systolic function is normal. The right ventricular  size is normal. There is normal pulmonary artery systolic pressure.  3. Left atrial size was moderately dilated.  4. Mild-to-moderate thickening and calcification of the mitral valve  leaflet(s).  5. The mitral valve is degenerative. Mild mitral valve regurgitation.  Moderate mitral annular calcification.There is at least mild calcific  mitral stenosis with mean gradient 94mmHg at HR 52 and MVA 1.1cm2 by  continuity equation.  6. The aortic valve is tricuspid. There is moderate calcification of the  aortic valve. There is moderate thickening of the aortic valve. Aortic  valve regurgitation is trivial. Moderate aortic valve stenosis. AVA  0.9cm2, mean gradient 64mmHg, peak  gradient 55mmHg, Vmax 3.44m/s  7. The inferior vena cava is normal in size with greater than 50%  respiratory variability, suggesting right atrial pressure of 3 mmHg.   Comparison(s): Compared to prior study in 05/2020, the LVEF now has low  normal systolic function (previously 55-60%). The mitral valve leaflets on  current study have more restricted motion with mild mitral stenosis. The  aortic valve stenosis remains  moderate but the mean gradient is now higher at 40mmHg (previously  6mmHg).   FINDINGS  Left Ventricle: Left ventricular ejection fraction, by estimation, is 50  to 55%. The left ventricle has low normal function. The left ventricle has  no regional wall motion abnormalities. The left ventricular internal  cavity size was normal in size.  There is mild concentric left ventricular hypertrophy. Left ventricular  diastolic parameters are consistent with Grade II diastolic dysfunction  (pseudonormalization). Elevated left atrial pressure.   Right Ventricle: The right ventricular size is normal. No increase in  right ventricular wall thickness. Right  ventricular systolic function is  normal. There is normal pulmonary artery systolic pressure. The tricuspid  regurgitant velocity is 2.28 m/s, and  with an assumed right atrial pressure of 3 mmHg, the estimated right  ventricular systolic pressure is 65.4 mmHg.   Left Atrium: Left atrial size was moderately dilated.   Right Atrium: Right atrial size was normal in size.   Pericardium: There is no evidence of pericardial effusion.   Mitral Valve: There is at least mild calcific mitral stenosis with mean  gradient 44mmHg at HR 52 and MVA 1.1cm2 by continuity equation. The mitral  valve is degenerative in appearance. There is mild-to-moderate  thickening  and calcification of the mitral  valve leaflet(s). Moderate mitral annular calcification. Mild mitral valve  regurgitation. Mild to moderate mitral valve stenosis. MV peak gradient,  13.8 mmHg. The mean mitral valve gradient is 4.0 mmHg.   Tricuspid Valve: The tricuspid valve is normal in structure. Tricuspid  valve regurgitation is mild.   Aortic Valve: There is moderate aortic stenosis with AVA 0.9cm2, mean  gradient 51mmHg, peak gradient 7mmHg, Vmax 3.37m/s. The aortic valve is  tricuspid. There is moderate calcification of the aortic valve. There is  moderate thickening of the aortic  valve. Aortic valve regurgitation is trivial. Moderate aortic stenosis is  present. Aortic valve mean gradient measures 22.0 mmHg. Aortic valve peak  gradient measures 35.1 mmHg. Aortic valve area, by VTI measures 0.83 cm.   Pulmonic Valve: The pulmonic valve was normal in structure. Pulmonic valve  regurgitation is trivial.   Aorta: The aortic root and ascending aorta are structurally normal, with  no evidence of dilitation.   Venous: The inferior vena cava is normal in size with greater than 50%  respiratory variability, suggesting right atrial pressure of 3 mmHg.   IAS/Shunts: No atrial level shunt detected by color flow Doppler.     LEFT  VENTRICLE  PLAX 2D  LVIDd:     5.20 cm   Diastology  LVIDs:     3.80 cm   LV e' medial:  5.19 cm/s  LV PW:     1.00 cm   LV E/e' medial: 32.9  LV IVS:    1.10 cm   LV e' lateral:  6.24 cm/s  LVOT diam:   1.90 cm   LV E/e' lateral: 27.4  LV SV:     64  LV SV Index:  29      2D Longitudinal Strain  LVOT Area:   2.84 cm   2D Strain GLS Avg:   -16.3 %    LV Volumes (MOD)  LV vol d, MOD A2C: 137.0 ml  LV vol d, MOD A4C: 111.0 ml  LV vol s, MOD A2C: 71.5 ml  LV vol s, MOD A4C: 53.8 ml  LV SV MOD A2C:   65.5 ml  LV SV MOD A4C:   111.0 ml  LV SV MOD BP:   60.2 ml   RIGHT VENTRICLE  RV S prime:   10.50 cm/s  TAPSE (M-mode): 2.2 cm   LEFT ATRIUM       Index    RIGHT ATRIUM      Index  LA diam:    4.70 cm 2.16 cm/m RA Area:   23.30 cm  LA Vol (A2C):  53.5 ml 24.58 ml/m RA Volume:  64.60 ml 29.69 ml/m  LA Vol (A4C):  69.8 ml 32.07 ml/m  LA Biplane Vol: 61.7 ml 28.35 ml/m  AORTIC VALVE  AV Area (Vmax):  0.80 cm  AV Area (Vmean):  0.86 cm  AV Area (VTI):   0.83 cm  AV Vmax:      296.33 cm/s  AV Vmean:     217.667 cm/s  AV VTI:      0.768 m  AV Peak Grad:   35.1 mmHg  AV Mean Grad:   22.0 mmHg  LVOT Vmax:     83.20 cm/s  LVOT Vmean:    65.800 cm/s  LVOT VTI:     0.224 m  LVOT/AV VTI ratio: 0.29    AORTA  Ao Root diam: 3.30 cm  Ao Asc diam: 3.00 cm   MITRAL  VALVE         TRICUSPID VALVE  MV Area (PHT): 1.97 cm   TR Peak grad:  20.8 mmHg  MV Area VTI:  0.89 cm   TR Vmax:    228.00 cm/s  MV Peak grad: 13.8 mmHg  MV Mean grad: 4.0 mmHg   SHUNTS  MV Vmax:    1.86 m/s   Systemic VTI: 0.22 m  MV Vmean:   88.8 cm/s   Systemic Diam: 1.90 cm  MV Decel Time: 385 msec  MR Peak grad:  105.7 mmHg  MR Mean grad:  82.0 mmHg  MR Vmax:     514.00 cm/s  MR Vmean:    437.0 cm/s  MR PISA:     1.57 cm   MR PISA Eff ROA: 10 mm  MR PISA Radius: 0.50 cm  MV E velocity: 171.00 cm/s  MV A velocity: 85.90 cm/s  MV E/A ratio: 1.99   Cardiac cath June 2021:   The left ventricular systolic function is normal.  LV end diastolic pressure is normal.  The left ventricular ejection fraction is 50-55% by visual estimate.  There is no aortic valve stenosis.  Minimal, nobstructive CAD. Tortuous cororany arteries.  Diagnostic Dominance: Right  Left Anterior Descending  The vessel exhibits minimal luminal irregularities.  Left Circumflex  The vessel exhibits minimal luminal irregularities.  Right Coronary Artery  The vessel exhibits minimal luminal irregularities.   Intervention   No interventions have been documented.  Wall Motion  Resting                Left Heart  Left Ventricle The left ventricular size is normal. The left ventricular systolic function is normal. LV end diastolic pressure is normal. The left ventricular ejection fraction is 50-55% by visual estimate. No regional wall motion abnormalities.  Aortic Valve There is no aortic valve stenosis.   Coronary Diagrams   Diagnostic Dominance: Right    Intervention   Recent Labs: 06/01/2020: B Natriuretic Peptide 298.0 06/04/2020: Magnesium 2.2 02/22/2021: TSH 3.160 03/10/2021: ALT 25; Hemoglobin 12.3; Platelets 250 03/28/2021: BUN 17; Creatinine, Ser 1.10; Potassium 3.6; Sodium 140    Wt Readings from Last 3 Encounters:  04/07/21 243 lb 6.4 oz (110.4 kg)  03/28/21 244 lb (110.7 kg)  03/21/21 240 lb (108.9 kg)     Assessment and Plan:   1. Severe Aortic Valve Stenosis: She has moderate aortic stenosis with progression of her disease since last echo in 2021. I have personally reviewed the echo images. The aortic valve is thickened, calcified with limited leaflet mobility but the valve does open. There appears to be moderate aortic stenosis. She is doing well from a symptom standpoint currently.   I have  reviewed the natural history of aortic stenosis with the patient and their family members  who are present today. We have discussed the limitations of medical therapy and the poor prognosis associated with symptomatic aortic stenosis. We have reviewed potential treatment options, including palliative medical therapy, conventional surgical aortic valve replacement, and transcatheter aortic valve replacement. We discussed treatment options in the context of the patient's specific comorbid medical conditions.   At this time, her aortic stenosis appears to be moderate and she is functionally doing very well with no symptoms. I will plan to repeat her echo in 6 months. I will see her after the echo. She will seek a dental consultation in the meantime. She will call with any change in her symptoms.     Current medicines  are reviewed at length with the patient today.  The patient does not have concerns regarding medicines.  The following changes have been made:  no change  Labs/ tests ordered today include:   Orders Placed This Encounter  Procedures  . ECHOCARDIOGRAM COMPLETE     Disposition:   FU with me in 6 months.    Signed, Lauree Chandler, MD 04/07/2021 10:01 AM    [facility] [place], [place], [place]  [address] Phone: [phone]; Fax: [phone]

## 2021-04-11 ENCOUNTER — Other Ambulatory Visit: Payer: Self-pay

## 2021-04-11 ENCOUNTER — Ambulatory Visit (HOSPITAL_COMMUNITY)
Admission: RE | Admit: 2021-04-11 | Discharge: 2021-04-11 | Disposition: A | Discharge status: Home / Self Care | Payer: Medicare Other | Source: Ambulatory Visit | Attending: Nurse Practitioner | Admitting: Nurse Practitioner

## 2021-04-11 ENCOUNTER — Encounter (HOSPITAL_BASED_OUTPATIENT_CLINIC_OR_DEPARTMENT_OTHER): Payer: Medicare Other | Admitting: Internal Medicine

## 2021-04-11 ENCOUNTER — Encounter (HOSPITAL_COMMUNITY): Payer: Self-pay | Admitting: Nurse Practitioner

## 2021-04-11 VITALS — BP 124/64 | HR 55 | Ht 66.0 in | Wt 245.0 lb

## 2021-04-11 DIAGNOSIS — Z79899 Other long term (current) drug therapy: Secondary | ICD-10-CM | POA: Diagnosis not present

## 2021-04-11 DIAGNOSIS — I4819 Other persistent atrial fibrillation: Secondary | ICD-10-CM | POA: Diagnosis not present

## 2021-04-11 DIAGNOSIS — Z7901 Long term (current) use of anticoagulants: Secondary | ICD-10-CM | POA: Diagnosis not present

## 2021-04-11 DIAGNOSIS — D6869 Other thrombophilia: Secondary | ICD-10-CM | POA: Diagnosis not present

## 2021-04-11 DIAGNOSIS — I10 Essential (primary) hypertension: Secondary | ICD-10-CM | POA: Diagnosis not present

## 2021-04-11 DIAGNOSIS — R600 Localized edema: Secondary | ICD-10-CM

## 2021-04-11 LAB — BASIC METABOLIC PANEL
Anion gap: 9 (ref 5–15)
BUN: 17 mg/dL (ref 8–23)
CO2: 27 mmol/L (ref 22–32)
Calcium: 9.1 mg/dL (ref 8.9–10.3)
Chloride: 103 mmol/L (ref 98–111)
Creatinine, Ser: 0.98 mg/dL (ref 0.44–1.00)
GFR, Estimated: 59 mL/min — ABNORMAL LOW (ref >60)
Glucose, Bld: 119 mg/dL — ABNORMAL HIGH (ref 70–99)
Potassium: 3.7 mmol/L (ref 3.5–5.1)
Sodium: 139 mmol/L (ref 135–145)

## 2021-04-12 ENCOUNTER — Encounter (HOSPITAL_COMMUNITY): Payer: Self-pay | Admitting: Nurse Practitioner

## 2021-04-12 DIAGNOSIS — L97822 Non-pressure chronic ulcer of other part of left lower leg with fat layer exposed: Secondary | ICD-10-CM | POA: Diagnosis not present

## 2021-04-12 DIAGNOSIS — E11622 Type 2 diabetes mellitus with other skin ulcer: Secondary | ICD-10-CM | POA: Diagnosis not present

## 2021-04-12 DIAGNOSIS — I872 Venous insufficiency (chronic) (peripheral): Secondary | ICD-10-CM | POA: Diagnosis not present

## 2021-04-12 DIAGNOSIS — Z7901 Long term (current) use of anticoagulants: Secondary | ICD-10-CM | POA: Diagnosis not present

## 2021-04-12 DIAGNOSIS — I480 Paroxysmal atrial fibrillation: Secondary | ICD-10-CM | POA: Diagnosis not present

## 2021-04-12 DIAGNOSIS — L97812 Non-pressure chronic ulcer of other part of right lower leg with fat layer exposed: Secondary | ICD-10-CM | POA: Diagnosis not present

## 2021-04-12 NOTE — Progress Notes (Signed)
Primary Care Physician: Glenis Smoker, MD Referring Physician:Acharya,Gayatri , MD   [name] [name] is a 80 y.o. female with a h/o HTN, DM, that presented tp [facility] ED with chest pain and found to have new onset afib. She was admitted and had an echo that was unremarkable and a Left heart cath that showed mimimal nonobstructive CAD. She was discharfed in afib on diltiazem 120 mg qd and eliquis 5 mg bid for a CHA2DS2VASc score of 5.  In the afib clinic she remains in afib with RVR. Overall she feels ok.No  further chest pain. She does not drink alcohol, drinks several diet Pepsi's a day, no tobacco and denies snoring.  She does have intermittent LLE, Rt > Lt and was d/c on lasix as needed.   F/u in afib clinic 7/7. She is now approaching the full 3 week requirement of DOAC for  cardioversion. She  has chronic lower extremity edema which preceded afib but has required more lasix since in afib. She is rate controlled.   F/u in afib clinic 7/23. Unfortunately, DCCV was cancelled as pt started having hematuria the day before so it was cancelled until it could be determined source of bleeding as her U/A did not show any infection. She took an round of antibiotics and has not seen any more bleeding. DCCV will be rescheduled.She continues in rate controlled afib.   F/u in afib clinic, 09/14/20. It was discovered that hematuria was coming from  a kidney stone that was surgically removed 9/4. The hematuria stopped about a week after that. She is now back in  the afib clinic to have the cardioversion rescheduled. She had a delayed eliquis dose by 8 hours on 9/14 so will be scheduled 3 weeks after that date.   F/u in afib clinic, 10/01/20. Unfortunately, she failed cardioversion. Dr. Johney Frame( CV MD) discussed with her starting amiodarone and gave her an RX. However, pt got home and read  the SE and wanted to wait until today to further discuss. She is concerned because her daughter has long QT syndrome, had  cardiac arrest in the past and has an ICD. Other than that, she does not like the SE profile of drug. She has minimal CAD but I don't  have an EKG in SR to assess if she has any conduction issues in consideration for use of flecainide. Pt feels she has been in afib since spring of 2020, but was unaware. She does have fatigue and some mild LLE with afib. Since she has been in afib for a while I have my doubts that Multaq would be sufficient to restore and hold SR. She is not excited re hospital stay for Tikosyn and we again have the family history of long qt. Her qt in afib is around 480 ms which is not ideal for tikosyn.   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 Therefore, it would be best to have her renal issues resolved first so we do not to have to worry about stopping blood thinners if other options to restore SR are pursued. Due to the issues stated above, she may be a front line ablation candidate.   F/u in afib clinic, 01/27/21. 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 Since her surgery, she has been very sedentary and has noticed decline in exertional endurance and more exertional dyspnea. Her weight is up from 241.78 lbs prior to surgery to 263.6 lbs after surgery. I think most of this is true weight, but her legs, which have been swollen since spring of 2020( ? onset of afib, not found  until 2021) have also shown more swelling with some weeping recently. Her HR here is slightly over 100, at home v rates are controlled, mostly less than 100 bpm. I conferred with Dr. Quentin Ore prior to her surgery and he said that he would be glad to discuss with her options to restore SR after her surgery. I do not see any clear cut path with AAD's with a qtc of 495 and as described above, daughter  with long qt syndrome, cardiac arrest and ICD.   F/u in afib clinic, 03/10/21. Pt was seen by Dr. Quentin Ore and felt that pt's best option to restore SR  was to be loaded with Amiodarone. He also placed her on torsemide 20 mg bid x one week  for ongoing fluid overload/ worsening LLE . He anticipated CV in a few weeks which I will set up. She states that she lost 20 lbs of fluid  with the torsemide bid but since back to one a day for the last week, her shortness of breath has returned and  LLE has worsened that she now has 2 big water blisters on the left leg and the rt leg is weeping.  She is now down to 200 mg daily. She  did miss one dose of eliquis 3/8 so will need to wait until amio is loaded and on full anticoagulation after 3/30 to CV. She remains in afib at 88 bpm.   F/u in afib clinic, 03/28/21, after CV showed that she was going in and out of afib. Today she is in sinus brady low 50's. Her HR's at home have been in the upper 40's/low 50's for the last 4-5 days so I feel she is staying in [place]. She has been to the wound center in [place],( [place] office soul not see her for one month) to  treat her swollen legs, and is now wearing UNA Boots. She has them changed tomorrow. I will order an echo to see if structure of heart is contributing to the issue with LLE. She had mod AS by echo last year. Overall, she is feeling better.   F/u in afib clinic 04/11/21. She saw Dr. Angelena Form for f/u of most recent  echo and Mod aortic value stenosis. He felt it was stable and will check echo again in 6 months. Her LLE is improving, still being followed at the wound center. She is in SR on amiodarone 200 mg daily. She is noticing less fatigue and shortness of breath.   Today, she denies symptoms of palpitations, chest pain, shortness of breath, orthopnea, PND, lower extremity edema, dizziness, presyncope, syncope, or neurologic sequela. The patient is tolerating medications without difficulties and is otherwise without  complaint today.   Past Medical History:  Diagnosis Date  . Anemia    hx of  . Arthritis    knee and shoulders  . Atrial fibrillation ([place])   . Carpal tunnel syndrome on right   . Coronary artery disease 05/2020   Minimal, nobstructive CAD. Tortuous cororany arteries.  . Diabetes mellitus without complication (HCC)    diet controlled no meds in 5-6 yrs  . Dyspnea  on exertion   . Dysrhythmia 05/2020   afib  . GERD (gastroesophageal reflux disease)    No longer taking medications  . Hematuria   . History of hypothyroidism 30 yrs ago  . History of kidney stones   . Hypertension   . Lower extremity edema    chronic lower extremity edema   . Myocardial infarction [facility])    patient mention questionable MI  . Renal mass    Past Surgical History:  Procedure Laterality Date  . ABDOMINAL HYSTERECTOMY  1992   complete  . BILATERAL CARPAL TUNNEL RELEASE  yrs ago  . CARDIOVERSION N/A 09/23/2020   Procedure: CARDIOVERSION;  Surgeon: Freada Bergeron, MD;  Location: [facility] ENDOSCOPY;  Service: Cardiovascular;  Laterality: N/A;  . CARDIOVERSION N/A 03/21/2021   Procedure: CARDIOVERSION;  Surgeon: Werner Lean, MD;  Location: [place] ENDOSCOPY;  Service: Cardiovascular;  Laterality: N/A;  . CHOLECYSTECTOMY  1996  . CYSTOSCOPY/URETEROSCOPY/HOLMIUM LASER/STENT PLACEMENT Bilateral 08/19/2020   Procedure: CYSTOSCOPY BILATERAL  RETROGRADE  URETEROSCOPY/HOLMIUM LASER/STENT PLACEMENT;  Surgeon: Irine Seal, MD;  Location: [facility];  Service: Urology;  Laterality: Bilateral;  . HAMMER TOE SURGERY Right yrs ago  . IR RADIOLOGIST EVAL & MGMT  10/26/2020  . IR RADIOLOGIST EVAL & MGMT  01/06/2021  . IR RADIOLOGIST EVAL & MGMT  03/22/2021  . LEFT HEART CATH AND CORONARY ANGIOGRAPHY N/A 06/02/2020   Procedure: LEFT HEART CATH AND CORONARY ANGIOGRAPHY;  Surgeon: Jettie Booze, MD;  Location: [place] CV LAB;  Service: Cardiovascular;  Laterality: N/A;  . RADIOLOGY WITH  ANESTHESIA N/A 11/17/2020   Procedure: RADIOLOGY WITH ANESTHESIA RENAL MICROWAVE ABLATION;  Surgeon: Suzette Battiest, MD;  Location: [facility] ORS;  Service: Radiology;  Laterality: N/A;  . TONSILLECTOMY  age 18    Current Outpatient Medications  Medication Sig Dispense Refill  . acetaminophen (TYLENOL) 500 MG tablet Take 1,000 mg by mouth every 6 (six) hours as needed for moderate pain or headache.    [name] amiodarone (PACERONE) 200 MG tablet Take 1 tablet (200 mg total) by mouth daily. 120 tablet 3  . apixaban (ELIQUIS) 5 MG TABS tablet Take 1 tablet (5 mg total) by mouth 2 (two) times daily. 60 tablet 6  . Calcium Carb-Cholecalciferol (CALCIUM 600+D) 600-800 MG-UNIT TABS Take 1 tablet by mouth every evening.     [name] Hydrocortisone-Aloe Vera (QMVHQIONG-29/BMWU EX) Apply 1 application topically 2 (two) times daily as needed (itching).    . Liniments (BLUE-EMU SUPER STRENGTH) CREA Apply 1 application topically 2 (two) times daily as needed (pain.).     [name] loperamide (IMODIUM A-D) 2 MG tablet Take 2-4 mg by mouth as needed for diarrhea or loose stools.     . metoprolol tartrate (LOPRESSOR) 25 MG tablet Take 1.5 tablets (37.5 mg total) by mouth 2 (two) times daily. 90 tablet 3  . Multiple Vitamin (MULTIVITAMIN WITH MINERALS) TABS tablet Take 1 tablet by mouth daily.    . Multiple Vitamins-Minerals (PRESERVISION AREDS 2 PO) Take 1 capsule by mouth 2 (two) times daily.     . Potassium Chloride ER 20 MEQ TBCR Take 40 mEq by mouth daily. 60 tablet 3  . torsemide (DEMADEX) 20 MG tablet Take 1 tablet (20 mg total) by mouth 2 (two) times daily. 60 tablet 3   No current facility-administered medications for this encounter.    Allergies  Allergen Reactions  . Tetanus-Diphtheria Toxoids Td Other (See Comments)    Felt very sick  . Cardizem [Diltiazem] Rash  Social History   Socioeconomic History  . Marital status: Married    Spouse name: Not on file  . Number of children: 5  . Years of education: Not on  file  . Highest education level: Not on file  Occupational History  . Occupation: Retired-pastor  Tobacco Use  . Smoking status: Never Smoker  . Smokeless tobacco: Never Used  Vaping Use  . Vaping Use: Never used  Substance and Sexual Activity  . Alcohol use: Not Currently  . Drug use: Never  . Sexual activity: Not Currently    Birth control/protection: Surgical  Other Topics Concern  . Not on file  Social History Narrative  . Not on file   Social Determinants of Health   Financial Resource Strain: Not on file  Food Insecurity: Not on file  Transportation Needs: Not on file  Physical Activity: Not on file  Stress: Not on file  Social Connections: Not on file  Intimate Partner Violence: Not on file    Family History  Problem Relation Age of Onset  . Hypertension Mother   . Hypertension Father   . Diabetes Paternal Grandmother     ROS- All systems are reviewed and negative except as per the HPI above  Physical Exam: Vitals:   04/11/21 1435  BP: 124/64  Pulse: (!) 55  Weight: 111.1 kg  Height: 5\' 6"  (1.676 m)   Wt Readings from Last 3 Encounters:  04/11/21 111.1 kg  04/07/21 110.4 kg  03/28/21 110.7 kg    Labs: Lab Results  Component Value Date   NA 139 04/11/2021   K 3.7 04/11/2021   CL 103 04/11/2021   CO2 27 04/11/2021   GLUCOSE 119 (H) 04/11/2021   BUN 17 04/11/2021   CREATININE 0.98 04/11/2021   CALCIUM 9.1 04/11/2021   PHOS 2.9 06/02/2020   MG 2.2 06/04/2020   Lab Results  Component Value Date   INR 1.2 11/17/2020   Lab Results  Component Value Date   CHOL 184 06/01/2020   HDL 51 06/01/2020   LDLCALC 120 (H) 06/01/2020   TRIG 67 06/01/2020     GEN- The patient is well appearing, alert and oriented x 3 today.   Head- normocephalic, atraumatic Eyes-  Sclera clear, conjunctiva pink Ears- hearing intact Oropharynx- clear Neck- supple, no JVP Lymph- no cervical lymphadenopathy Lungs- Clear to ausculation bilaterally, normal work of  breathing Heart- regular rate and rhythm, 2/6 systolic murmur, rubs or gallops, PMI not laterally displaced GI- soft, NT, ND, + BS Extremities- no clubbing, cyanosis, 2+ edema with statis dermatitis, rt leg weeping, 2 large water blisters left leg  MS- no significant deformity or atrophy Skin- no rash or lesion Psych- euthymic mood, full affect Neuro- strength and sensation are intact  EKG- NSR at 55 bpm, pr int 196 ms, qrs int 98 ms, qtc 482 ms      LHC- 06/02/20-The left ventricular systolic function is normal.  LV end diastolic pressure is normal.  The left ventricular ejection fraction is 50-55% by visual estimate.  There is no aortic valve stenosis.  Minimal, nobstructive CAD. Tortuous cororany arteries.   Echo- 03/28/21 1. Left ventricular ejection fraction, by estimation, is 50 to 55%. The left ventricle has low normal function. The left ventricle has no regional wall motion abnormalities. There is mild concentric left ventricular hypertrophy. Left ventricular diastolic parameters are consistent with Grade II diastolic dysfunction (pseudonormalization). Elevated left atrial pressure. 2. Right ventricular systolic function is normal. The right ventricular size is  normal. There is normal pulmonary artery systolic pressure. 3. Left atrial size was moderately dilated. 4. Mild-to-moderate thickening and calcification of the mitral valve leaflet(s). 5. The mitral valve is degenerative. Mild mitral valve regurgitation. Moderate mitral annular calcification.There is at least mild calcific mitral stenosis with mean gradient 25mmHg at HR 52 and MVA 1.1cm2 by continuity equation. 6. The aortic valve is tricuspid. There is moderate calcification of the aortic valve. There is moderate thickening of the aortic valve. Aortic valve regurgitation is trivial. Moderate aortic valve stenosis. AVA 0.9cm2, mean gradient 7mmHg, peak gradient 70mmHg, Vmax 3.51m/s 7. The inferior vena cava is  normal in size with greater than 50% respiratory variability, suggesting right atrial pressure of 3 mmHg.  Comparison(s): Compared to prior study in 05/2020, the LVEF now has low normal systolic function (previously 55-60%). The mitral valve leaflets on current study have more restricted motion with mild mitral stenosis. The aortic valve stenosis remains moderate but the mean gradient is now higher at 45mmHg (previously 53mmHg)    Assessment and Plan: 1. 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 Lambert,02/22/21,  amiodarone load started and lasix changed to torsemide 20 mg bid. While on this dose had less LLE and shortness of breath, 20 lb weight loss.  With reduction to 20 mg daily the dyspnea, LLE with water blisters and weeping  returned. Increased  back to torsemide 20 mg bid and referred to wound clinic to dress and manage LLE fluid.  Now swelling, water blisters and open areas are almost resoolved  BMET today  Will try to decrease torsemide back to once daily with pt watching carefully for any increased edema, if present go back to bid dosing.    2. CHA2DS2VASc score of  at least 5  Continue eliquis 5 mg bid   3. Renal growth  s/p surgery  4. Moderate AS  recent echo showed slight progression, being followed by Dr. Angelena Form with another echo pending in 6 months    F/u with Dr. Quentin Ore in July  With [place] clinic as needed    Geroge Baseman. Yohannes Waibel, [facility] [address] [place], [place] [address] [phone]

## 2021-04-13 NOTE — Progress Notes (Signed)
DEAN, WONDER ([id number]) Visit Report for 04/12/2021 Arrival Information Details Patient Name: [name] [name], [name] [name]. Date of Service: 04/12/2021 9:30 AM Medical Record Number: [id number] Patient Account Number: [id number] Date of Birth/Sex: [birth year] (80 y.o. F) Treating RN: Donnamarie Poag Primary Care Katricia Prehn: Sela Hilding Other Clinician: Referring Nethan Caudillo: Sela Hilding Treating Maurina Fawaz/Extender: Tito Dine in Treatment: 3 Visit Information History Since Last Visit Added or deleted any medications: No Patient Arrived: Ambulatory Had a fall or experienced change in No Arrival Time: 09:32 activities of daily living that may affect Accompanied By: husband risk of falls: Transfer Assistance: None Hospitalized since last visit: No Patient Identification Verified: Yes Pain Present Now: No Secondary Verification Process Completed: Yes Patient Requires Transmission-Based No Precautions: Patient Has Alerts: Yes Patient Alerts: Patient on Blood Thinner type ll diabetic Electronic Signature(s) Signed: 04/13/2021 4:53:53 PM By: Jeanine Luz Entered By: Jeanine Luz on 04/12/2021 10:07:17 [name] [name] ([id number]) -------------------------------------------------------------------------------- Clinic Level of Care Assessment Details Patient Name: [name] [name]. Date of Service: 04/12/2021 9:30 AM Medical Record Number: [id number] Patient Account Number: [id number] Date of Birth/Sex: 06/30/1941 (80 y.o. F) Treating RN: Donnamarie Poag Primary Care Che Rachal: Sela Hilding Other Clinician: Referring Katelee Schupp: Sela Hilding Treating Tritia Endo/Extender: Tito Dine in Treatment: 3 Clinic Level of Care Assessment Items TOOL 1 Quantity Score []  - Use when EandM and Procedure is performed on INITIAL visit 0 ASSESSMENTS - Nursing Assessment / Reassessment []  - General Physical Exam (combine w/ comprehensive assessment (listed just below) when  performed on new 0 pt. evals) []  - 0 Comprehensive Assessment (HX, ROS, Risk Assessments, Wounds Hx, etc.) ASSESSMENTS - Wound and Skin Assessment / Reassessment []  - Dermatologic / Skin Assessment (not related to wound area) 0 ASSESSMENTS - Ostomy and/or Continence Assessment and Care []  - Incontinence Assessment and Management 0 []  - 0 Ostomy Care Assessment and Management (repouching, etc.) PROCESS - Coordination of Care []  - Simple Patient / Family Education for ongoing care 0 []  - 0 Complex (extensive) Patient / Family Education for ongoing care []  - 0 Staff obtains Programmer, systems, Records, Test Results / Process Orders []  - 0 Staff telephones HHA, Nursing Homes / Clarify orders / etc []  - 0 Routine Transfer to another Facility (non-emergent condition) []  - 0 Routine Hospital Admission (non-emergent condition) []  - 0 New Admissions / Biomedical engineer / Ordering NPWT, Apligraf, etc. []  - 0 Emergency Hospital Admission (emergent condition) PROCESS - Special Needs []  - Pediatric / Minor Patient Management 0 []  - 0 Isolation Patient Management []  - 0 Hearing / Language / Visual special needs []  - 0 Assessment of Community assistance (transportation, D/C planning, etc.) []  - 0 Additional assistance / Altered mentation []  - 0 Support Surface(s) Assessment (bed, cushion, seat, etc.) INTERVENTIONS - Miscellaneous []  - External ear exam 0 []  - 0 Patient Transfer (multiple staff / Civil Service fast streamer / Similar devices) []  - 0 Simple Staple / Suture removal (25 or less) []  - 0 Complex Staple / Suture removal (26 or more) []  - 0 Hypo/Hyperglycemic Management (do not check if billed separately) []  - 0 Ankle / Brachial Index (ABI) - do not check if billed separately Has the patient been seen at the hospital within the last three years: Yes Total Score: 0 Level Of Care: ____ [name] [name] ([id number]) Electronic Signature(s) Signed: 04/13/2021 4:53:53 PM By: Jeanine Luz Entered By: Jeanine Luz on 04/12/2021 10:10:01 [name] [name] ([id number]) -------------------------------------------------------------------------------- Compression Therapy Details Patient Name: [name] [name]. Date of Service: 04/12/2021  9:30 AM Medical Record Number: [id number] Patient Account Number: [id number] Date of Birth/Sex: Sep 09, 1941 (80 y.o. F) Treating RN: Donnamarie Poag Primary Care Dominiqua Cooner: Sela Hilding Other Clinician: Referring Zarah Carbon: Sela Hilding Treating Melaina Howerton/Extender: Tito Dine in Treatment: 3 Compression Therapy Performed for Wound Assessment: Wound #3 Left,Medial Lower Leg Performed By: Clinician Donnamarie Poag, RN Compression Type: Three Layer Pre Treatment ABI: 0.8 Electronic Signature(s) Signed: 04/13/2021 4:53:53 PM By: Jeanine Luz Entered By: Jeanine Luz on 04/12/2021 10:08:12 [name] [name] ([id number]) -------------------------------------------------------------------------------- Compression Therapy Details Patient Name: [name] [name]. Date of Service: 04/12/2021 9:30 AM Medical Record Number: [id number] Patient Account Number: [id number] Date of Birth/Sex: 1941/04/03 (80 y.o. F) Treating RN: Donnamarie Poag Primary Care Quanita Barona: Sela Hilding Other Clinician: Referring Arel Tippen: Sela Hilding Treating [place] Nie/Extender: Tito Dine in Treatment: 3 Compression Therapy Performed for Wound Assessment: Wound #4 Left,Lateral Lower Leg Performed By: Clinician Donnamarie Poag, RN Compression Type: Three Layer Pre Treatment ABI: 0.8 Electronic Signature(s) Signed: 04/13/2021 4:53:53 PM By: Jeanine Luz Entered By: Jeanine Luz on 04/12/2021 10:08:34 [name] [name] ([id number]) -------------------------------------------------------------------------------- Compression Therapy Details Patient Name: [name] [name]. Date of Service: 04/12/2021 9:30 AM Medical Record Number: [id number] Patient  Account Number: [id number] Date of Birth/Sex: 13-Oct-1941 (80 y.o. F) Treating RN: Donnamarie Poag Primary Care Adair Lemar: Sela Hilding Other Clinician: Referring Hayslee Casebolt: Sela Hilding Treating Uriel Horkey/Extender: Tito Dine in Treatment: 3 Compression Therapy Performed for Wound Assessment: Non-Wound Location Performed By: Clinician Donnamarie Poag, RN Compression Type: Three Layer Pre Treatment ABI: 0.8 Location: Lower Extremity, Right Electronic Signature(s) Signed: 04/13/2021 4:53:53 PM By: Jeanine Luz Entered By: Jeanine Luz on 04/12/2021 10:09:01 [name] [name] ([id number]) -------------------------------------------------------------------------------- Encounter Discharge Information Details Patient Name: [name] [name]. Date of Service: 04/12/2021 9:30 AM Medical Record Number: [id number] Patient Account Number: [id number] Date of Birth/Sex: 04/04/1941 (80 y.o. F) Treating RN: Donnamarie Poag Primary Care Catherin Doorn: Sela Hilding Other Clinician: Referring Lillyahna Hemberger: Sela Hilding Treating Edwing Figley/Extender: Tito Dine in Treatment: 3 Encounter Discharge Information Items Discharge Condition: Stable Ambulatory Status: Ambulatory Discharge Destination: Home Transportation: Private Auto Accompanied By: husband Schedule Follow-up Appointment: Yes Clinical Summary of Care: Electronic Signature(s) Signed: 04/13/2021 4:53:53 PM By: Jeanine Luz Entered By: Jeanine Luz on 04/12/2021 10:09:38 [name] [name] ([id number]) -------------------------------------------------------------------------------- Wound Assessment Details Patient Name: [name] [name]. Date of Service: 04/12/2021 9:30 AM Medical Record Number: [id number] Patient Account Number: [id number] Date of Birth/Sex: [birth year] (81 y.o. F) Treating RN: Donnamarie Poag Primary Care Lucien Budney: Sela Hilding Other Clinician: Referring Troy Kanouse: Sela Hilding Treating  Avin Upperman/Extender: Tito Dine in Treatment: 3 Wound Status Wound Number: 3 Primary Etiology: Venous Leg Ulcer Wound Location: Left, Medial Lower Leg Wound Status: Open Wounding Event: Blister Date Acquired: 03/17/2021 Weeks Of Treatment: 3 Clustered Wound: No Wound Measurements Length: (cm) 0.2 Width: (cm) 0.2 Depth: (cm) 0.1 Area: (cm) 0.031 Volume: (cm) 0.003 % Reduction in Area: 99.9% % Reduction in Volume: 99.9% Wound Description Classification: Full Thickness Without Exposed Support Structure s Treatment Notes Wound #3 (Lower Leg) Wound Laterality: Left, Medial Cleanser Soap and Water Discharge Instruction: Gently cleanse wound with antibacterial soap, rinse and pat dry prior to dressing wounds Peri-Wound Care Triamcinolone Acetonide Cream, 0.1%, 15 (g) tube Discharge Instruction: Apply on reddened areas Topical Primary Dressing Xtrasorb Classic Super Absorbent Dressing, 6x9 (in/in) Secondary Dressing Secured With Compression Wrap Profore Lite LF 3 Multilayer Compression Halma Discharge Instruction: Apply 3 multi-layer wrap as prescribed. Compression Stockings Add-Ons Electronic Signature(s) Signed: 04/12/2021 12:39:32 PM By: Lyndel Safe,  Joy Signed: 04/13/2021 4:53:53 PM By: Jeanine Luz Entered By: Jeanine Luz on 04/12/2021 10:07:38 [name] [name] ([id number]) -------------------------------------------------------------------------------- Wound Assessment Details Patient Name: [name] [name]. Date of Service: 04/12/2021 9:30 AM Medical Record Number: [id number] Patient Account Number: [id number] Date of Birth/Sex: 06/27/1941 (80 y.o. F) Treating RN: Donnamarie Poag Primary Care Marieli Rudy: Sela Hilding Other Clinician: Referring Timathy Newberry: Sela Hilding Treating [place] Hy/Extender: Tito Dine in Treatment: 3 Wound Status Wound Number: 4 Primary Etiology: Venous Leg Ulcer Wound Location: Left, Lateral Lower  Leg Wound Status: Open Wounding Event: Blister Date Acquired: 03/17/2021 Weeks Of Treatment: 3 Clustered Wound: No Wound Measurements Length: (cm) 1.9 Width: (cm) 2 Depth: (cm) 0.1 Area: (cm) 2.985 Volume: (cm) 0.298 % Reduction in Area: 70.7% % Reduction in Volume: 70.7% Wound Description Classification: Full Thickness Without Exposed Support Structure s Treatment Notes Wound #4 (Lower Leg) Wound Laterality: Left, Lateral Cleanser Soap and Water Discharge Instruction: Gently cleanse wound with antibacterial soap, rinse and pat dry prior to dressing wounds Peri-Wound Care Triamcinolone Acetonide Cream, 0.1%, 15 (g) tube Discharge Instruction: Apply on reddened areas Topical Primary Dressing Xtrasorb Classic Super Absorbent Dressing, 6x9 (in/in) Secondary Dressing Secured With Compression Wrap Profore Lite LF 3 Multilayer Compression Bandaging System Discharge Instruction: Apply 3 multi-layer wrap as prescribed. Compression Stockings Add-Ons Electronic Signature(s) Signed: 04/12/2021 12:39:32 PM By: Donnamarie Poag Signed: 04/13/2021 4:53:53 PM By: Jeanine Luz Entered By: Jeanine Luz on 04/12/2021 10:07:39

## 2021-04-18 ENCOUNTER — Encounter: Payer: Medicare Other | Attending: Physician Assistant | Admitting: Physician Assistant

## 2021-04-18 ENCOUNTER — Other Ambulatory Visit: Payer: Self-pay

## 2021-04-18 DIAGNOSIS — L97812 Non-pressure chronic ulcer of other part of right lower leg with fat layer exposed: Secondary | ICD-10-CM | POA: Diagnosis not present

## 2021-04-18 DIAGNOSIS — I872 Venous insufficiency (chronic) (peripheral): Secondary | ICD-10-CM | POA: Insufficient documentation

## 2021-04-18 DIAGNOSIS — I480 Paroxysmal atrial fibrillation: Secondary | ICD-10-CM | POA: Diagnosis not present

## 2021-04-18 DIAGNOSIS — E11622 Type 2 diabetes mellitus with other skin ulcer: Secondary | ICD-10-CM | POA: Diagnosis not present

## 2021-04-18 DIAGNOSIS — L97822 Non-pressure chronic ulcer of other part of left lower leg with fat layer exposed: Secondary | ICD-10-CM | POA: Insufficient documentation

## 2021-04-18 DIAGNOSIS — Z7901 Long term (current) use of anticoagulants: Secondary | ICD-10-CM | POA: Diagnosis not present

## 2021-04-18 NOTE — Progress Notes (Addendum)
CASIDEE, JANN ([id number]) Visit Report for 04/18/2021 Chief Complaint Document Details Patient Name: [name] [name]. Date of Service: 04/18/2021 9:30 AM Medical Record Number: [id number] Patient Account Number: [id number] Date of Birth/Sex: 1941/07/12 (80 y.o. F) Treating RN: Donnamarie Poag Primary Care Provider: Sela Hilding Other Clinician: Referring Provider: Sela Hilding Treating Provider/Extender: Skipper Cliche in Treatment: 4 Information Obtained from: Patient Chief Complaint Bilateral LE Ulcers Electronic Signature(s) Signed: 04/18/2021 9:45:35 AM By: Worthy Keeler PA-C Entered By: Worthy Keeler on 04/18/2021 09:45:34 [name] [name] ([id number]) -------------------------------------------------------------------------------- HPI Details Patient Name: [name] [name]. Date of Service: 04/18/2021 9:30 AM Medical Record Number: [id number] Patient Account Number: [id number] Date of Birth/Sex: 01/28/1941 (80 y.o. F) Treating RN: Donnamarie Poag Primary Care Provider: Sela Hilding Other Clinician: Referring Provider: Sela Hilding Treating Provider/Extender: Skipper Cliche in Treatment: 4 History of Present Illness HPI Description: 03/17/2021 upon evaluation today patient presents for initial inspection here in the clinic today concerning issues that she has been having with wounds over the bilateral lower extremities. Unfortunately these appear to be venous in nature and have come up fairly insidiously though they have been going on for several months. The patient does have a history of chronic venous insufficiency she has not been wearing any compression prior to this happening. With that being said she also has diabetes that she is not taking any current medications. She also has atrial fibrillation as well as being on long-term anticoagulant therapy. With that being said she is scheduled to have a cardioversion on Monday. Her cardiologist feels like that a lot of  the swelling in her leg stems from the significant amount of Cardiac dysfunction is related to the atrial fibrillation. They are hoping that the cardioversion will help this if it is successful. They are try to hold off on proceeding with any type of ablation until her legs are healed it sounds like to me. 03/29/2021 upon evaluation today patient appears to be doing well in regard to the wounds on her bilateral lower extremities. She is actually making a significant amount of progress in such a short time and a very pleased in that regard. I do think that she needs to continue to monitor for any signs of worsening as far as infection is concerned. This would often be noted obviously by increased pain or drainage right now or see none of that and overall I am extremely pleased with where things stand today. 04/05/2021 upon evaluation today patient appears to be doing well currently in regard to her wounds on the lower extremities. Fortunately there does not appear to be any signs of active infection at this time which is great news and in general and very pleased. I think the right leg is pretty much completely healed the left leg though not completely healed is doing significantly better which is great news. There does not appear to be any evidence of active infection at this time which is excellent as well. 04/18/2021 upon evaluation today patient appears to be doing well with regard to her leg ulcers. She has been tolerating the dressing changes without complication. Fortunately there does not appear to be any signs of active infection at this time. No fevers, chills, nausea, vomiting, or diarrhea. Electronic Signature(s) Signed: 04/18/2021 10:10:21 AM By: Worthy Keeler PA-C Entered By: Worthy Keeler on 04/18/2021 10:10:21 [name] [name] ([id number]) -------------------------------------------------------------------------------- Physical Exam Details Patient Name: [name] [name]. Date of Service:  04/18/2021 9:30 AM Medical Record Number: [id number] Patient  Account Number: [id number] Date of Birth/Sex: 18-Jun-1941 (80 y.o. F) Treating RN: Donnamarie Poag Primary Care Provider: Sela Hilding Other Clinician: Referring Provider: Sela Hilding Treating Provider/Extender: Skipper Cliche in Treatment: 4 Constitutional Well-nourished and well-hydrated in no acute distress. Respiratory normal breathing without difficulty. Psychiatric this patient is able to make decisions and demonstrates good insight into disease process. Alert and Oriented x 3. pleasant and cooperative. Notes Patient's wound bed showed signs of good granulation epithelization at this point. I do not see any evidence of active infection currently which is great news and overall very pleased. Electronic Signature(s) Signed: 04/18/2021 10:10:46 AM By: Worthy Keeler PA-C Entered By: Worthy Keeler on 04/18/2021 10:10:46 [name] [name] ([id number]) -------------------------------------------------------------------------------- Physician Orders Details Patient Name: [name] [name]. Date of Service: 04/18/2021 9:30 AM Medical Record Number: [id number] Patient Account Number: [id number] Date of Birth/Sex: 08-Sep-1941 (80 y.o. 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 Bathing/ Shower/ Hygiene o May shower with wound dressing protected with water repellent cover or cast protector. Edema Control - Lymphedema / Segmental Compressive Device / Other Bilateral Lower Extremities o Optional: One layer of unna paste to top of compression wrap (to act as an anchor). o 3 Layer Compression System for Lymphedema. - Lotion and TCA on Right leg o Patient to wear own Velcro compression garment. Remove compression stockings every night before going to bed and put on every morning when getting up. - Ordered 04/19/21 after former wrap cancelled due to back order Wound Treatment Wound #4 - Lower Leg Wound Laterality: Left, Lateral Cleanser: Soap and Water 1 x Per Week/15 Days Discharge Instructions: Gently cleanse wound with antibacterial soap, rinse and pat dry prior to dressing wounds Peri-Wound Care: Triamcinolone Acetonide Cream, 0.1%, 15 (g) tube 1 x Per Week/15 Days Discharge Instructions: Apply on reddened areas Primary Dressing: Xtrasorb Classic Super Absorbent Dressing, 6x9 (in/in) 1 x Per Week/15 Days Compression Wrap: Profore Lite LF 3 Multilayer Compression Bandaging System 1 x Per Week/15 Days Discharge Instructions: Apply 3 multi-layer wrap as prescribed. Electronic Signature(s) Signed: 04/19/2021 4:14:10 PM By: Donnamarie Poag Signed: 04/19/2021 4:59:24 PM By: Worthy Keeler PA-C Previous Signature: 04/18/2021 1:00:54 PM Version By: Donnamarie Poag Previous Signature: 04/18/2021 5:38:17 PM Version By: Worthy Keeler PA-C Entered By: Donnamarie Poag on 04/19/2021 11:51:32 Wiederholt, Nadara [name] ([id number]) -------------------------------------------------------------------------------- Problem List Details Patient Name: [name] [name]. Date of Service: 04/18/2021 9:30 AM Medical Record Number: [id number] Patient Account Number: [id number] Date of Birth/Sex: [birth year] (80 y.o. F) Treating RN: Donnamarie Poag Primary Care Provider: Sela Hilding Other Clinician: Referring  Provider: Sela Hilding Treating Provider/Extender: Skipper Cliche in Treatment: 4 Active Problems ICD-10 Encounter Code Description Active Date MDM Diagnosis I87.2 Venous insufficiency (chronic) (peripheral) 03/17/2021 No Yes L97.822 Non-pressure chronic ulcer of other part of left lower leg with fat layer 03/17/2021 No Yes exposed L97.812 Non-pressure chronic ulcer of other part of right lower leg with fat layer 03/17/2021 No Yes exposed E11.622 Type  2 diabetes mellitus with other skin ulcer 03/17/2021 No Yes I48.0 Paroxysmal atrial fibrillation 03/17/2021 No Yes Z79.01 Long term (current) use of anticoagulants 03/17/2021 No Yes Inactive Problems Resolved Problems Electronic Signature(s) Signed: 04/18/2021 9:44:25 AM By: Worthy Keeler PA-C Entered By: Worthy Keeler on 04/18/2021 09:44:25 Quiggle, Nadara [name] ([id number]) -------------------------------------------------------------------------------- Progress Note Details Patient Name: [name] [name]. Date of Service: 04/18/2021 9:30 AM Medical Record Number: [id number] Patient Account Number: [id number] Date of Birth/Sex: 1940/12/28 (80 y.o. F) Treating RN: Donnamarie Poag Primary Care Provider: Sela Hilding Other Clinician: Referring Provider: Sela Hilding Treating Provider/Extender: Skipper Cliche in Treatment: 4 Subjective Chief Complaint Information obtained from Patient Bilateral LE Ulcers History of Present Illness (HPI) 03/17/2021 upon evaluation today patient presents for initial inspection here in the clinic today concerning issues that she has been having with wounds over the bilateral lower extremities. Unfortunately these appear to be venous in nature and have come up fairly insidiously though they have been going on for several months. The patient does have a history of chronic venous insufficiency she has not been wearing any compression prior to this happening. With that being said she also has diabetes  that she is not taking any current medications. She also has atrial fibrillation as well as being on long-term anticoagulant therapy. With that being said she is scheduled to have a cardioversion on Monday. Her cardiologist feels like that a lot of the swelling in her leg stems from the significant amount of Cardiac dysfunction is related to the atrial fibrillation. They are hoping that the cardioversion will help this if it is successful. They are try to hold off on proceeding with any type of ablation until her legs are healed it sounds like to me. 03/29/2021 upon evaluation today patient appears to be doing well in regard to the wounds on her bilateral lower extremities. She is actually making a significant amount of progress in such a short time and a very pleased in that regard. I do think that she needs to continue to monitor for any signs of worsening as far as infection is concerned. This would often be noted obviously by increased pain or drainage right now or see none of that and overall I am extremely pleased with where things stand today. 04/05/2021 upon evaluation today patient appears to be doing well currently in regard to her wounds on the lower extremities. Fortunately there does not appear to be any signs of active infection at this time which is great news and in general and very pleased. I think the right leg is pretty much completely healed the left leg though not completely healed is doing significantly better which is great news. There does not appear to be any evidence of active infection at this time which is excellent as well. 04/18/2021 upon evaluation today patient appears to be doing well with regard to her leg ulcers. She has been tolerating the dressing changes without complication. Fortunately there does not appear to be any signs of active infection at this time. No fevers, chills, nausea, vomiting, or diarrhea. Objective Constitutional Well-nourished and well-hydrated  in no acute distress. Vitals Time Taken: 9:32 AM, Height: 66 in, Weight: 242 lbs, BMI: 39.1, Temperature: 97.9 F, Pulse: 55 bpm, Respiratory Rate: 18 breaths/min, Blood Pressure: 131/71 mmHg. Respiratory normal breathing without difficulty. Psychiatric this patient is able to make decisions and demonstrates good insight into disease process. Alert and Oriented x 3. pleasant and cooperative. General Notes: Patient's wound bed showed signs of  good granulation epithelization at this point. I do not see any evidence of active infection currently which is great news and overall very pleased. Integumentary (Hair, Skin) Wound #3 status is Healed - Epithelialized. Original cause of wound was Blister. The date acquired was: 03/17/2021. The wound has been in treatment 4 weeks. The wound is located on the Left,Medial Lower Leg. The wound measures 0cm length x 0cm width x 0cm depth; 0cm^2 area and 0cm^3 volume. There is no tunneling or undermining noted. There is a none present amount of drainage noted. There is large (67-100%) red, pink granulation within the wound bed. There is no necrotic tissue within the wound bed. Wound #4 status is Open. Original cause of wound was Blister. The date acquired was: 03/17/2021. The wound has been in treatment 4 weeks. The wound is located on the Left,Lateral Lower Leg. The wound measures 0.9cm length x 1.7cm width x 0.1cm depth; 1.202cm^2 area and Blew, Caliah K. ([id number]) 0.12cm^3 volume. There is no tunneling or undermining noted. There is a small amount of serous drainage noted. There is large (67-100%) red granulation within the wound bed. There is a small (1-33%) amount of necrotic tissue within the wound bed including Eschar. 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 Bathing/ Shower/ Hygiene: May shower with wound dressing protected with water repellent cover or cast protector. Edema Control - Lymphedema / Segmental Compressive Device / Other: Optional: One layer of unna paste to top of compression wrap (to act as an anchor). 3 Layer Compression System for Lymphedema. - Lotion and TCA on Right leg WOUND #4: - Lower Leg Wound Laterality: Left, Lateral Cleanser: Soap and Water 1 x Per Week/15 Days Discharge Instructions: Gently cleanse wound with antibacterial soap, rinse and pat dry prior to dressing wounds Peri-Wound Care: Triamcinolone Acetonide Cream, 0.1%, 15 (g) tube 1 x Per Week/15 Days Discharge Instructions: Apply on reddened areas Primary Dressing: Xtrasorb Classic Super Absorbent Dressing, 6x9 (in/in) 1 x Per Week/15 Days Compression Wrap: Profore Lite LF 3 Multilayer Compression Bandaging System 1 x Per Week/15 Days Discharge Instructions: Apply 3 multi-layer wrap as prescribed. 1. Would recommend currently that we continue with the triamcinolone followed by the XtraSorb on the left leg I think this is doing a good job and to be honest the patient is almost completely healed which is great news. In regard to the right leg this is completely healed. 2. I am also going to recommend that we have the patient continue with a 3 layer compression wrap. 3. I would also suggest that we have the patient continue to monitor for any signs of infection. Such as increased pain. The right now everything seems to be doing quite well. We will see patient back for reevaluation in 1 week here in the clinic. If anything worsens or changes patient will contact our office for additional recommendations. Electronic Signature(s) Signed: 04/18/2021 10:13:52 AM By: Worthy Keeler  PA-C Entered By: Worthy Keeler on 04/18/2021 10:13:51 Codd, Nadara [name] ([id number]) -------------------------------------------------------------------------------- SuperBill Details Patient Name: [name] [name]. Date of Service: 04/18/2021 Medical Record Number: [id number] Patient Account Number: [id number] Date of Birth/Sex: [birth year] (80 y.o. 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 I48.0 Paroxysmal atrial fibrillation Z79.01 Long term (current) use of anticoagulants Facility Procedures CPT4 Code: [id number] Description: [id number] - WOUND CARE VISIT-LEV 3 EST PT Modifier: Quantity: 1 Physician Procedures CPT4 Code: [id number] Description: [id number] - WC PHYS LEVEL 3 - EST PT Modifier: Quantity: 1 CPT4 Code: Description: ICD-10 Diagnosis Description I87.2 Venous insufficiency (chronic) (peripheral) L97.822 Non-pressure chronic ulcer of other part of left lower leg with fat lay L97.812 Non-pressure chronic ulcer of other part of right lower leg with fat la  E11.622 Type 2 diabetes mellitus with other skin ulcer Modifier: er exposed yer exposed Quantity: Electronic Signature(s) Signed: 04/18/2021 10:14:35 AM By: Worthy Keeler PA-C Entered By: Worthy Keeler on 04/18/2021 10:14:35

## 2021-04-18 NOTE — Progress Notes (Addendum)
NIKE, SOUTHERS ([id number]) Visit Report for 04/18/2021 Arrival Information Details Patient Name: [name] [name], [name] [name]. Date of Service: 04/18/2021 9:30 AM Medical Record Number: [id number] Patient Account Number: [id number] Date of Birth/Sex: [birth year] (80 y.o. F) Treating RN: Donnamarie Poag Primary Care Provider: Sela Hilding Other Clinician: Referring Provider: Sela Hilding Treating Provider/Extender: Skipper Cliche in Treatment: 4 Visit Information History Since Last Visit Added or deleted any medications: No Patient Arrived: Ambulatory Had a fall or experienced change in No Arrival Time: 09:31 activities of daily living that may affect Accompanied By: husband risk of falls: Transfer Assistance: None Hospitalized since last visit: No Patient Identification Verified: Yes Pain Present Now: No Secondary Verification Process Completed: Yes Patient Requires Transmission-Based No Precautions: Patient Has Alerts: Yes Patient Alerts: Patient on Blood Thinner type ll diabetic Electronic Signature(s) Signed: 04/18/2021 5:04:44 PM By: Jeanine Luz Entered By: Jeanine Luz on 04/18/2021 09:32:19 Korenek, [name] ([id number]) -------------------------------------------------------------------------------- Clinic Level of Care Assessment Details Patient Name: [name] [name]. Date of Service: 04/18/2021 9:30 AM Medical Record Number: [id number] Patient Account Number: [id number] Date of Birth/Sex: 11/08/1941 (80 y.o. F) Treating RN: Donnamarie Poag Primary Care Provider: Sela Hilding Other Clinician: Referring Provider: Sela Hilding Treating Provider/Extender: Skipper Cliche in Treatment: 4 Clinic Level of Care Assessment Items TOOL 4 Quantity Score [] - Use when only an EandM is performed on FOLLOW-UP visit 0 ASSESSMENTS - Nursing Assessment / Reassessment [] - Reassessment of Co-morbidities (includes updates in patient status) 0 [] - 0 Reassessment of Adherence to  Treatment Plan ASSESSMENTS - Wound and Skin Assessment / Reassessment [] - Simple Wound Assessment / Reassessment - one wound 0 X- 1 5 Complex Wound Assessment / Reassessment - multiple wounds [] - 0 Dermatologic / Skin Assessment (not related to wound area) ASSESSMENTS - Focused Assessment X - Circumferential Edema Measurements - multi extremities 2 5 [] - 0 Nutritional Assessment / Counseling / Intervention [] - 0 Lower Extremity Assessment (monofilament, tuning fork, pulses) [] - 0 Peripheral Arterial Disease Assessment (using hand held doppler) ASSESSMENTS - Ostomy and/or Continence Assessment and Care [] - Incontinence Assessment and Management 0 [] - 0 Ostomy Care Assessment and Management (repouching, etc.) PROCESS - Coordination of Care [] - Simple Patient / Family Education for ongoing care 0 X- 1 20 Complex (extensive) Patient / Family Education for ongoing care [] - 0 Staff obtains Programmer, systems, Records, Test Results / Process Orders [] - 0 Staff telephones HHA, Nursing Homes / Clarify orders / etc [] - 0 Routine Transfer to another Facility (non-emergent condition) [] - 0 Routine Hospital Admission (non-emergent condition) [] - 0 New Admissions / Biomedical engineer / Ordering NPWT, Apligraf, etc. [] - 0 Emergency Hospital Admission (emergent condition) X- 1 10 Simple Discharge Coordination [] - 0 Complex (extensive) Discharge Coordination PROCESS - Special Needs [] - Pediatric / Minor Patient Management 0 [] - 0 Isolation Patient Management [] - 0 Hearing / Language / Visual special needs [] - 0 Assessment of Community assistance (transportation, D/C planning, etc.) [] - 0 Additional assistance / Altered mentation [] - 0 Support Surface(s) Assessment (bed, cushion, seat, etc.) INTERVENTIONS - Wound Cleansing / Measurement Ishii, Edia K. ([id number]) X- 1 5 Simple Wound Cleansing - one wound [] - 0 Complex Wound Cleansing - multiple wounds X- 1  5 Wound Imaging (photographs - any number of wounds) [] - 0 Wound Tracing (instead of photographs) X- 1 5 Simple Wound Measurement - one wound [] - 0 Complex Wound Measurement -  multiple wounds INTERVENTIONS - Wound Dressings X - Small Wound Dressing one or multiple wounds 1 10 [] - 0 Medium Wound Dressing one or multiple wounds [] - 0 Large Wound Dressing one or multiple wounds X- 1 5 Application of Medications - topical [] - 0 Application of Medications - injection INTERVENTIONS - Miscellaneous [] - External ear exam 0 [] - 0 Specimen Collection (cultures, biopsies, blood, body fluids, etc.) [] - 0 Specimen(s) / Culture(s) sent or taken to Lab for analysis [] - 0 Patient Transfer (multiple staff / Civil Service fast streamer / Similar devices) [] - 0 Simple Staple / Suture removal (25 or less) [] - 0 Complex Staple / Suture removal (26 or more) [] - 0 Hypo / Hyperglycemic Management (close monitor of Blood Glucose) [] - 0 Ankle / Brachial Index (ABI) - do not check if billed separately X- 1 5 Vital Signs Has the patient been seen at the hospital within the last three years: Yes Total Score: 80 Level Of Care: New/Established - Level 3 Electronic Signature(s) Signed: 04/18/2021 1:00:54 PM By: Donnamarie Poag Entered By: Donnamarie Poag on 04/18/2021 10:04:32 Jacot, [name] ([id number]) -------------------------------------------------------------------------------- Encounter Discharge Information Details Patient Name: [name] [name]. Date of Service: 04/18/2021 9:30 AM Medical Record Number: [id number] Patient Account Number: [id number] Date of Birth/Sex: 1941-01-21 (80 y.o. F) Treating RN: Carlene Coria Primary Care Provider: Sela Hilding Other Clinician: Referring Provider: Sela Hilding Treating Provider/Extender: Skipper Cliche in Treatment: 4 Encounter Discharge Information Items Discharge Condition: Stable Ambulatory Status: Ambulatory Discharge Destination:  Home Transportation: Private Auto Accompanied By: self Schedule Follow-up Appointment: Yes Clinical Summary of Care: Electronic Signature(s) Signed: 04/18/2021 5:22:07 PM By: Carlene Coria RN Entered By: Carlene Coria on 04/18/2021 10:24:28 [name] [name] ([id number]) -------------------------------------------------------------------------------- Lower Extremity Assessment Details Patient Name: [name] [name]. Date of Service: 04/18/2021 9:30 AM Medical Record Number: [id number] Patient Account Number: [id number] Date of Birth/Sex: 03-20-1941 (80 y.o. F) Treating RN: Donnamarie Poag Primary Care Provider: Sela Hilding Other Clinician: Referring Provider: Sela Hilding Treating Provider/Extender: Skipper Cliche in Treatment: 4 Edema Assessment Assessed: [name]: Yes] [Right: No] Edema: [Left: Ye] [Right: s] Calf Left: Right: Point of Measurement: 35 cm From Medial Instep 44 cm Ankle Left: Right: Point of Measurement: 9 cm From Medial Instep 23.5 cm Knee To Floor Left: Right: From Medial Instep 40 cm Vascular Assessment Pulses: Dorsalis Pedis Palpable: [Left:Yes] Notes knee to floor left 40cm right 42cm Electronic Signature(s) Signed: 04/18/2021 12:50:45 PM By: Donnamarie Poag Entered By: Donnamarie Poag on 04/18/2021 12:50:45 Cirilo, [name] ([id number]) -------------------------------------------------------------------------------- Multi Wound Chart Details Patient Name: [name] [name]. Date of Service: 04/18/2021 9:30 AM Medical Record Number: [id number] Patient Account Number: [id number] Date of Birth/Sex: 1941/05/07 (80 y.o. F) Treating RN: Donnamarie Poag Primary Care Provider: Sela Hilding Other Clinician: Referring Provider: Sela Hilding Treating Provider/Extender: Skipper Cliche in Treatment: 4 Vital Signs Height(in): 66 Pulse(bpm): 32 Weight(lbs): 242 Blood Pressure(mmHg): 131/71 Body Mass Index(BMI): 39 Temperature(F): 97.9 Respiratory  Rate(breaths/min): 18 Photos: [N/A:N/A] Wound Location: Left, Medial Lower Leg Left, Lateral Lower Leg N/A Wounding Event: Blister Blister N/A Primary Etiology: Venous Leg Ulcer Venous Leg Ulcer N/A Comorbid History: Cataracts, Hypertension, Type II Cataracts, Hypertension, Type II N/A Diabetes, Rheumatoid Arthritis Diabetes, Rheumatoid Arthritis Date Acquired: 03/17/2021 03/17/2021 N/A Weeks of Treatment: 4 4 N/A Wound Status: Open Open N/A Measurements L x W x D (cm) 0.1x0.1x0.1 0.9x1.7x0.1 N/A Area (cm) : 0.008 1.202 N/A Volume (cm) : 0.001 0.12 N/A % Reduction in Area: 100.00% 88.20%  N/A % Reduction in Volume: 100.00% 88.20% N/A Classification: Full Thickness Without Exposed Full Thickness Without Exposed N/A Support Structures Support Structures Exudate Amount: None Present Small N/A Exudate Type: N/A Serous N/A Exudate Color: N/A amber N/A Granulation Amount: Large (67-100%) Large (67-100%) N/A Granulation Quality: Red, Pink Red N/A Necrotic Amount: None Present (0%) Small (1-33%) N/A Necrotic Tissue: N/A Eschar N/A Exposed Structures: Fascia: No Fascia: No N/A Fat Layer (Subcutaneous Tissue): Fat Layer (Subcutaneous Tissue): No No Tendon: No Tendon: No Muscle: No Muscle: No Joint: No Joint: No Bone: No Bone: No Treatment Notes Electronic Signature(s) Signed: 04/18/2021 1:00:54 PM By: Donnamarie Poag Entered By: Donnamarie Poag on 04/18/2021 09:55:15 Strickler, [name] ([id number]) -------------------------------------------------------------------------------- [place] Details Patient Name: [name] [name]. Date of Service: 04/18/2021 9:30 AM Medical Record Number: [id number] Patient Account Number: [id number] Date of Birth/Sex: May 20, 1941 (80 y.o. F) Treating RN: Donnamarie Poag Primary Care Provider: Sela Hilding Other Clinician: Referring Provider: Sela Hilding Treating Provider/Extender: Skipper Cliche in Treatment: 4 Active Inactive Venous  Leg Ulcer Nursing Diagnoses: Actual venous Insuffiency (use after diagnosis is confirmed) Goals: Patient will maintain optimal edema control Date Initiated: 03/17/2021 Target Resolution Date: 05/06/2021 Goal Status: Active Patient/caregiver will verbalize understanding of disease process and disease management Date Initiated: 03/17/2021 Date Inactivated: 04/19/2021 Target Resolution Date: 03/17/2021 Goal Status: Met Verify adequate tissue perfusion prior to therapeutic compression application Date Initiated: 03/17/2021 Date Inactivated: 04/19/2021 Target Resolution Date: 03/17/2021 Goal Status: Met Interventions: Assess peripheral edema status every visit. Compression as ordered Provide education on venous insufficiency Treatment Activities: Therapeutic compression applied : 03/17/2021 Notes: Wound/Skin Impairment Nursing Diagnoses: Impaired tissue integrity Goals: Patient/caregiver will verbalize understanding of skin care regimen Date Initiated: 03/17/2021 Date Inactivated: 03/29/2021 Target Resolution Date: 03/17/2021 Goal Status: Met Ulcer/skin breakdown will have a volume reduction of 30% by week 4 Date Initiated: 03/17/2021 Date Inactivated: 04/05/2021 Target Resolution Date: 04/16/2021 Goal Status: Met Ulcer/skin breakdown will have a volume reduction of 50% by week 8 Date Initiated: 03/17/2021 Target Resolution Date: 05/16/2021 Goal Status: Active Ulcer/skin breakdown will have a volume reduction of 80% by week 12 Date Initiated: 03/17/2021 Target Resolution Date: 06/16/2021 Goal Status: Active Ulcer/skin breakdown will heal within 14 weeks Date Initiated: 03/17/2021 Target Resolution Date: 07/16/2021 Goal Status: Active Interventions: Assess patient/caregiver ability to obtain necessary supplies Assess patient/caregiver ability to perform ulcer/skin care regimen upon admission and as needed Assess ulceration(s) every visit Provide education on ulcer and skin care KIMORA, STANKOVIC ([id number]) Treatment Activities: Referred to DME provider for dressing supplies : 03/17/2021 Skin care regimen initiated : 03/17/2021 Notes: Electronic Signature(s) Signed: 04/19/2021 4:34:54 PM By: Donnamarie Poag Previous Signature: 04/18/2021 1:00:54 PM Version By: Donnamarie Poag Entered By: Donnamarie Poag on 04/19/2021 16:34:54 Debnam, [name] ([id number]) -------------------------------------------------------------------------------- Pain Assessment Details Patient Name: [name] [name]. Date of Service: 04/18/2021 9:30 AM Medical Record Number: [id number] Patient Account Number: [id number] Date of Birth/Sex: [birth year] (80 y.o. F) Treating RN: Donnamarie Poag Primary Care Provider: Sela Hilding Other Clinician: Referring Provider: Sela Hilding Treating Provider/Extender: Skipper Cliche in Treatment: 4 Active Problems Location of Pain Severity and Description of Pain Patient Has Paino No Site Locations Rate the pain. Current Pain Level: 0 Pain Management and Medication Current Pain Management: Electronic Signature(s) Signed: 04/18/2021 1:00:54 PM By: Donnamarie Poag Signed: 04/18/2021 5:04:44 PM By: Jeanine Luz Entered By: Jeanine Luz on 04/18/2021 09:35:50 Gundrum, [name] ([id number]) -------------------------------------------------------------------------------- Patient/Caregiver Education Details Patient Name: [name] [name]. Date of Service: 04/18/2021 9:30 AM Medical Record  Number: [id number] Patient Account Number: [id number] Date of Birth/Gender: 1941-08-22 (80 y.o. F) Treating RN: Donnamarie Poag Primary Care Physician: Sela Hilding Other Clinician: Referring Physician: Sela Hilding Treating Physician/Extender: Skipper Cliche in Treatment: 4 Education Assessment Education Provided To: Patient Education Topics Provided Wound/Skin Impairment: Electronic Signature(s) Signed: 04/18/2021 1:00:54 PM By: Donnamarie Poag Entered By: Donnamarie Poag on 04/18/2021  10:04:48 [name] [name] ([id number]) -------------------------------------------------------------------------------- Wound Assessment Details Patient Name: [name] [name]. Date of Service: 04/18/2021 9:30 AM Medical Record Number: [id number] Patient Account Number: [id number] Date of Birth/Sex: July 12, 1941 (80 y.o. F) Treating RN: Donnamarie Poag Primary Care Provider: Sela Hilding Other Clinician: Referring Provider: Sela Hilding Treating Provider/Extender: Skipper Cliche in Treatment: 4 Wound Status Wound Number: 3 Primary Venous Leg Ulcer Etiology: Wound Location: Left, Medial Lower Leg Wound Status: Healed - Epithelialized Wounding Event: Blister Comorbid Cataracts, Hypertension, Type II Diabetes, Date Acquired: 03/17/2021 History: Rheumatoid Arthritis Weeks Of Treatment: 4 Clustered Wound: No Photos Wound Measurements Length: (cm) 0 Width: (cm) 0 Depth: (cm) 0 Area: (cm) 0 Volume: (cm) 0 % Reduction in Area: 100% % Reduction in Volume: 100% Tunneling: No Undermining: No Wound Description Classification: Full Thickness Without Exposed Support Structure Exudate Amount: None Present s Foul Odor After Cleansing: No Slough/Fibrino No Wound Bed Granulation Amount: Large (67-100%) Exposed Structure Granulation Quality: Red, Pink Fascia Exposed: No Necrotic Amount: None Present (0%) Fat Layer (Subcutaneous Tissue) Exposed: No Tendon Exposed: No Muscle Exposed: No Joint Exposed: No Bone Exposed: No Treatment Notes Wound #3 (Lower Leg) Wound Laterality: Left, Medial Cleanser Peri-Wound Care Topical Primary Dressing KENNADY, ZIMMERLE ([id number]) Secondary Dressing Secured With Compression Wrap Compression Stockings Add-Ons Electronic Signature(s) Signed: 04/18/2021 1:00:54 PM By: Donnamarie Poag Entered By: Donnamarie Poag on 04/18/2021 09:57:07 Gosse, [name] ([id number]) -------------------------------------------------------------------------------- Wound  Assessment Details Patient Name: [name] [name]. Date of Service: 04/18/2021 9:30 AM Medical Record Number: [id number] Patient Account Number: [id number] Date of Birth/Sex: June 12, 1941 (80 y.o. F) Treating RN: Donnamarie Poag Primary Care Provider: Sela Hilding Other Clinician: Referring Provider: Sela Hilding Treating Provider/Extender: Skipper Cliche in Treatment: 4 Wound Status Wound Number: 4 Primary Venous Leg Ulcer Etiology: Wound Location: Left, Lateral Lower Leg Wound Status: Open Wounding Event: Blister Comorbid Cataracts, Hypertension, Type II Diabetes, Date Acquired: 03/17/2021 History: Rheumatoid Arthritis Weeks Of Treatment: 4 Clustered Wound: No Photos Wound Measurements Length: (cm) 0.9 Width: (cm) 1.7 Depth: (cm) 0.1 Area: (cm) 1.202 Volume: (cm) 0.12 % Reduction in Area: 88.2% % Reduction in Volume: 88.2% Tunneling: No Undermining: No Wound Description Classification: Full Thickness Without Exposed Support Structu Exudate Amount: Small Exudate Type: Serous Exudate Color: amber res Foul Odor After Cleansing: No Slough/Fibrino No Wound Bed Granulation Amount: Large (67-100%) Exposed Structure Granulation Quality: Red Fascia Exposed: No Necrotic Amount: Small (1-33%) Fat Layer (Subcutaneous Tissue) Exposed: No Necrotic Quality: Eschar Tendon Exposed: No Muscle Exposed: No Joint Exposed: No Bone Exposed: No Treatment Notes Wound #4 (Lower Leg) Wound Laterality: Left, Lateral Cleanser Soap and Water Discharge Instruction: Gently cleanse wound with antibacterial soap, rinse and pat dry prior to dressing wounds Peri-Wound Care Triamcinolone Acetonide Cream, 0.1%, 15 (g) tube Winquist, Lalisa K. ([id number]) Discharge Instruction: Apply on reddened areas Topical Primary Dressing Xtrasorb Classic Super Absorbent Dressing, 6x9 (in/in) Secondary Dressing Secured With Compression Wrap Profore Lite LF 3 Multilayer Compression Bandaging  System Discharge Instruction: Apply 3 multi-layer wrap as prescribed. Compression Stockings Add-Ons Electronic Signature(s) Signed: 04/18/2021 1:00:54 PM By: Donnamarie Poag Signed: 04/18/2021 5:04:44 PM By: Jeanine Luz Entered By: Marlyn Corporal  Kyara on 04/18/2021 09:49:46 SIGNA, CHEEK ([id number]) -------------------------------------------------------------------------------- Vitals Details Patient Name: TRISTINA, SAHAGIAN. Date of Service: 04/18/2021 9:30 AM Medical Record Number: [id number] Patient Account Number: [id number] Date of Birth/Sex: [birth year] (80 y.o. F) Treating RN: Donnamarie Poag Primary Care Provider: Sela Hilding Other Clinician: Referring Provider: Sela Hilding Treating Provider/Extender: Skipper Cliche in Treatment: 4 Vital Signs Time Taken: 09:32 Temperature (F): 97.9 Height (in): 66 Pulse (bpm): 55 Weight (lbs): 242 Respiratory Rate (breaths/min): 18 Body Mass Index (BMI): 39.1 Blood Pressure (mmHg): 131/71 Reference Range: 80 - 120 mg / dl Electronic Signature(s) Signed: 04/18/2021 5:04:44 PM By: Jeanine Luz Entered By: Jeanine Luz on 04/18/2021 09:35:42

## 2021-04-19 DIAGNOSIS — I872 Venous insufficiency (chronic) (peripheral): Secondary | ICD-10-CM | POA: Diagnosis not present

## 2021-04-19 DIAGNOSIS — L97812 Non-pressure chronic ulcer of other part of right lower leg with fat layer exposed: Secondary | ICD-10-CM | POA: Diagnosis not present

## 2021-04-25 ENCOUNTER — Encounter: Payer: Medicare Other | Admitting: Physician Assistant

## 2021-04-25 ENCOUNTER — Other Ambulatory Visit: Payer: Self-pay

## 2021-04-25 DIAGNOSIS — L97822 Non-pressure chronic ulcer of other part of left lower leg with fat layer exposed: Secondary | ICD-10-CM | POA: Diagnosis not present

## 2021-04-25 DIAGNOSIS — L97812 Non-pressure chronic ulcer of other part of right lower leg with fat layer exposed: Secondary | ICD-10-CM | POA: Diagnosis not present

## 2021-04-25 DIAGNOSIS — Z7901 Long term (current) use of anticoagulants: Secondary | ICD-10-CM | POA: Diagnosis not present

## 2021-04-25 DIAGNOSIS — E11622 Type 2 diabetes mellitus with other skin ulcer: Secondary | ICD-10-CM | POA: Diagnosis not present

## 2021-04-25 DIAGNOSIS — I872 Venous insufficiency (chronic) (peripheral): Secondary | ICD-10-CM | POA: Diagnosis not present

## 2021-04-25 DIAGNOSIS — I480 Paroxysmal atrial fibrillation: Secondary | ICD-10-CM | POA: Diagnosis not present

## 2021-04-25 NOTE — Progress Notes (Signed)
[name] [name] ([id number]) Visit Report for 04/25/2021 Arrival Information Details Patient Name: [name] [name]. Date of Service: 04/25/2021 10:15 AM Medical Record Number: [id number] Patient Account Number: [id number] Date of Birth/Sex: 05/11/1941 (80 y.o. F) Treating RN: [name] [name] Primary Care Provider: Sela [name] Other Clinician: Referring Provider: Sela [name] Treating Provider/Extender: [name] [name] in Treatment: 5 Visit Information History Since Last Visit Pain Present Now: No Patient Arrived: Ambulatory Arrival Time: 10:23 Accompanied By: husband Transfer Assistance: None Patient Identification Verified: Yes Secondary Verification Process Completed: Yes Patient Requires Transmission-Based No Precautions: Patient Has Alerts: Yes Patient Alerts: Patient on Blood Thinner type ll diabetic Electronic Signature(s) Signed: 04/25/2021 11:53:54 AM By: [name] Mouse, Minus Breeding RN Entered By: [name] [name] on 04/25/2021 10:24:11 [name] [name] ([id number]) -------------------------------------------------------------------------------- Clinic Level of Care Assessment Details Patient Name: [name] [name]. Date of Service: 04/25/2021 10:15 AM Medical Record Number: [id number] Patient Account Number: [id number] Date of Birth/Sex: [birth year] (80 y.o. F) Treating RN: [name] [name] Primary Care Provider: Sela [name] Other Clinician: Referring Provider: Sela [name] Treating Provider/Extender: [name] [name] in Treatment: 5 Clinic Level of Care Assessment Items TOOL 1 Quantity Score [] - Use when EandM and Procedure is performed on INITIAL visit 0 ASSESSMENTS - Nursing Assessment / Reassessment [] - General Physical Exam (combine w/ comprehensive assessment (listed just below) when performed on new 0 pt. evals) [] - 0 Comprehensive Assessment (HX, ROS, Risk Assessments, Wounds Hx, etc.) ASSESSMENTS - Wound and Skin Assessment / Reassessment [] -  Dermatologic / Skin Assessment (not related to wound area) 0 ASSESSMENTS - Ostomy and/or Continence Assessment and Care [] - Incontinence Assessment and Management 0 [] - 0 Ostomy Care Assessment and Management (repouching, etc.) PROCESS - Coordination of Care [] - Simple Patient / Family Education for ongoing care 0 [] - 0 Complex (extensive) Patient / Family Education for ongoing care [] - 0 Staff obtains Consents, Records, Test Results / Process Orders [] - 0 Staff telephones HHA, Nursing Homes / Clarify orders / etc [] - 0 Routine Transfer to another Facility (non-emergent condition) [] - 0 Routine Hospital Admission (non-emergent condition) [] - 0 New Admissions / Biomedical engineer / Ordering NPWT, Apligraf, etc. [] - 0 Emergency Hospital Admission (emergent condition) PROCESS - Special Needs [] - Pediatric / Minor Patient Management 0 [] - 0 Isolation Patient Management [] - 0 Hearing / Language / Visual special needs [] - 0 Assessment of Community assistance (transportation, D/C planning, etc.) [] - 0 Additional assistance / Altered mentation [] - 0 Support Surface(s) Assessment (bed, cushion, seat, etc.) INTERVENTIONS - Miscellaneous [] - External ear exam 0 [] - 0 Patient Transfer (multiple staff / Civil Service fast streamer / Similar devices) [] - 0 Simple Staple / Suture removal (25 or less) [] - 0 Complex Staple / Suture removal (26 or more) [] - 0 Hypo/Hyperglycemic Management (do not check if billed separately) [] - 0 Ankle / Brachial Index (ABI) - do not check if billed separately Has the patient been seen at the hospital within the last three years: Yes Total Score: 0 Level Of Care: ____ [name] [name] ([id number]) Electronic Signature(s) Signed: 04/25/2021 5:00:28 PM By: [name] [name] Entered By: [name] [name] on 04/25/2021 10:56:32 [name] [name] ([id number]) -------------------------------------------------------------------------------- Compression Therapy  Details Patient Name: [name] [name]. Date of Service: 04/25/2021 10:15 AM Medical Record Number: [id number] Patient Account Number: [id number] Date of Birth/Sex: Jun 02, 1941 (80 y.o. F) Treating RN: [name] [name] Primary Care Provider: Sela [name] Other Clinician:  Referring Provider: Sela [name] Treating Provider/Extender: [name] [name] in Treatment: 5 Compression Therapy Performed for Wound Assessment: Wound #4 Left,Lateral Lower Leg Performed By: Clinician [name] Poag, RN Compression Type: Three Layer Post Procedure Diagnosis Same as Pre-procedure Electronic Signature(s) Signed: 04/25/2021 5:00:28 PM By: [name] [name] Entered By: [name] [name] on 04/25/2021 10:56:26 [name] [name] ([id number]) -------------------------------------------------------------------------------- Encounter Discharge Information Details Patient Name: [name] [name]. Date of Service: 04/25/2021 10:15 AM Medical Record Number: [id number] Patient Account Number: [id number] Date of Birth/Sex: 09-15-1941 (80 y.o. F) Treating RN: [name] [name] Primary Care Provider: Sela [name] Other Clinician: Referring Provider: Sela [name] Treating Provider/Extender: [name] [name] in Treatment: 5 Encounter Discharge Information Items Discharge Condition: Stable Ambulatory Status: Ambulatory Discharge Destination: Home Transportation: Private Auto Accompanied By: husband Schedule Follow-up Appointment: Yes Clinical Summary of Care: Electronic Signature(s) Signed: 04/25/2021 11:46:34 AM By: [name] Mouse, Minus Breeding RN Entered By: [name] [name] on 04/25/2021 11:46:33 [name] [name] ([id number]) -------------------------------------------------------------------------------- Lower Extremity Assessment Details Patient Name: [name] [name]. Date of Service: 04/25/2021 10:15 AM Medical Record Number: [id number] Patient Account Number: [id number] Date of Birth/Sex: 1941/09/07 (80 y.o. F) Treating RN:  [name] [name] Primary Care Provider: Sela [name] Other Clinician: Referring Provider: Sela [name] Treating Provider/Extender: Jeri Cos Weeks in Treatment: 5 Edema Assessment Assessed: [name] [name]: Yes] [Right: Yes] Edema: [Left: Yes] [Right: Yes] Calf Left: Right: Point of Measurement: 35 cm From Medial Instep 41.5 cm 41.5 cm Ankle Left: Right: Point of Measurement: 9 cm From Medial Instep 23.5 cm 25 cm Knee To Floor Left: Right: From Medial Instep 38.5 cm 38.5 cm Vascular Assessment Pulses: Dorsalis Pedis Palpable: [Left:Yes] [Right:Yes] Electronic Signature(s) Signed: 04/25/2021 11:53:54 AM By: [name] Mouse, Minus Breeding RN Entered By: [name] [name] on 04/25/2021 10:35:17 [name] [name] ([id number]) -------------------------------------------------------------------------------- Multi Wound Chart Details Patient Name: [name] [name]. Date of Service: 04/25/2021 10:15 AM Medical Record Number: [id number] Patient Account Number: [id number] Date of Birth/Sex: 10-21-1941 (80 y.o. F) Treating RN: [name] [name] Primary Care Provider: Sela [name] Other Clinician: Referring Provider: Sela [name] Treating Provider/Extender: [name] [name] in Treatment: 5 Vital Signs Height(in): 66 Pulse(bpm): 47 Weight(lbs): 242 Blood Pressure(mmHg): 126/77 Body Mass Index(BMI): 39 Temperature(F): 97.7 Respiratory Rate(breaths/min): 18 Photos: [N/A:N/A] Wound Location: Left, Lateral Lower Leg N/A N/A Wounding Event: Blister N/A N/A Primary Etiology: Venous Leg Ulcer N/A N/A Comorbid History: Cataracts, Hypertension, Type II N/A N/A Diabetes, Rheumatoid Arthritis Date Acquired: 03/17/2021 N/A N/A Weeks of Treatment: 5 N/A N/A Wound Status: Open N/A N/A Measurements L x W x D (cm) 0.8x0.9x0.1 N/A N/A Area (cm) : 0.565 N/A N/A Volume (cm) : 0.057 N/A N/A % Reduction in Area: 94.40% N/A N/A % Reduction in Volume: 94.40% N/A N/A Classification: Full Thickness  Without Exposed N/A N/A Support Structures Exudate Amount: Medium N/A N/A Exudate Type: Serous N/A N/A Exudate Color: amber N/A N/A Granulation Amount: Large (67-100%) N/A N/A Granulation Quality: Red N/A N/A Necrotic Amount: None Present (0%) N/A N/A Exposed Structures: Fat Layer (Subcutaneous Tissue): N/A N/A Yes Fascia: No Tendon: No Muscle: No Joint: No Bone: No Epithelialization: Medium (34-66%) N/A N/A Treatment Notes Electronic Signature(s) Signed: 04/25/2021 5:00:28 PM By: [name] [name] Entered By: [name] [name] on 04/25/2021 10:49:54 Wingler, [name] [name] ([id number]) -------------------------------------------------------------------------------- [place] Details Patient Name: [name] [name]. Date of Service: 04/25/2021 10:15 AM Medical Record Number: [id number] Patient Account Number: [id number] Date of Birth/Sex: 1941/01/20 (80 y.o. F) Treating RN: [name] [name] Primary Care Provider: Sela [name] Other Clinician: Referring Provider: Sela [name] Treating Provider/Extender: Joaquim Lai,  Hoyt Weeks in Treatment: 5 Active Inactive Venous Leg Ulcer Nursing Diagnoses: Actual venous Insuffiency (use after diagnosis is confirmed) Goals: Patient will maintain optimal edema control Date Initiated: 03/17/2021 Target Resolution Date: 05/13/2021 Goal Status: Active Patient/caregiver will verbalize understanding of disease process and disease management Date Initiated: 03/17/2021 Date Inactivated: 04/19/2021 Target Resolution Date: 03/17/2021 Goal Status: Met Verify adequate tissue perfusion prior to therapeutic compression application Date Initiated: 03/17/2021 Date Inactivated: 04/19/2021 Target Resolution Date: 03/17/2021 Goal Status: Met Interventions: Assess peripheral edema status every visit. Compression as ordered Provide education on venous insufficiency Treatment Activities: Therapeutic compression applied : 03/17/2021 Notes: Wound/Skin  Impairment Nursing Diagnoses: Impaired tissue integrity Goals: Patient/caregiver will verbalize understanding of skin care regimen Date Initiated: 03/17/2021 Date Inactivated: 03/29/2021 Target Resolution Date: 03/17/2021 Goal Status: Met Ulcer/skin breakdown will have a volume reduction of 30% by week 4 Date Initiated: 03/17/2021 Date Inactivated: 04/05/2021 Target Resolution Date: 04/16/2021 Goal Status: Met Ulcer/skin breakdown will have a volume reduction of 50% by week 8 Date Initiated: 03/17/2021 Target Resolution Date: 05/16/2021 Goal Status: Active Ulcer/skin breakdown will have a volume reduction of 80% by week 12 Date Initiated: 03/17/2021 Target Resolution Date: 06/16/2021 Goal Status: Active Ulcer/skin breakdown will heal within 14 weeks Date Initiated: 03/17/2021 Target Resolution Date: 07/16/2021 Goal Status: Active Interventions: Assess patient/caregiver ability to obtain necessary supplies Assess patient/caregiver ability to perform ulcer/skin care regimen upon admission and as needed Assess ulceration(s) every visit Provide education on ulcer and skin care SAMYA, SICILIANO ([id number]) Treatment Activities: Referred to DME Turki Tapanes for dressing supplies : 03/17/2021 Skin care regimen initiated : 03/17/2021 Notes: Electronic Signature(s) Signed: 04/25/2021 5:00:28 PM By: [name] [name] Entered By: [name] [name] on 04/25/2021 10:48:47 Ebarb, [name] [name] ([id number]) -------------------------------------------------------------------------------- Pain Assessment Details Patient Name: [name] [name]. Date of Service: 04/25/2021 10:15 AM Medical Record Number: [id number] Patient Account Number: [id number] Date of Birth/Sex: Apr 27, 1941 (80 y.o. F) Treating RN: [name] [name] Primary Care Jaystin Mcgarvey: [name] [name] Other Clinician: Referring Artemisia Auvil: [name] [name] Treating Eniya Cannady/Extender: [name] [name] in Treatment: 5 Active Problems Location of Pain Severity and Description  of Pain Patient Has Paino No Site Locations Rate the pain. Current Pain Level: 0 Pain Management and Medication Current Pain Management: Electronic Signature(s) Signed: 04/25/2021 11:53:54 AM By: [name] Mouse, Minus Breeding RN Entered By: [name] [name] on 04/25/2021 10:26:02 [name] [name] ([id number]) -------------------------------------------------------------------------------- Patient/Caregiver Education Details Patient Name: [name] [name]. Date of Service: 04/25/2021 10:15 AM Medical Record Number: [id number] Patient Account Number: [id number] Date of Birth/Gender: 05/22/1941 (80 y.o. F) Treating RN: [name] [name] Primary Care Physician: [name] [name] Other Clinician: Referring Physician: Sela [name] Treating Physician/Extender: [name] [name] in Treatment: 5 Education Assessment Education Provided To: Patient Education Topics Provided Basic Hygiene: Methods: Explain/Verbal Responses: State content correctly Venous: Handouts: Other: JUXTELITE DEMO/INSTRUCT Methods: Demonstration, Explain/Verbal Responses: State content correctly Wound/Skin Impairment: Electronic Signature(s) Signed: 04/25/2021 5:00:28 PM By: [name] [name] Entered By: [name] [name] on 04/25/2021 10:57:46 Hottel, [name] [name] ([id number]) -------------------------------------------------------------------------------- Wound Assessment Details Patient Name: [name] [name]. Date of Service: 04/25/2021 10:15 AM Medical Record Number: [id number] Patient Account Number: [id number] Date of Birth/Sex: 1941/04/16 (80 y.o. F) Treating RN: [name] [name] Primary Care Meng Winterton: [name] [name] Other Clinician: Referring Ruvi Fullenwider: [name] [name] Treating Zehra Rucci/Extender: Jeri Cos Weeks in Treatment: 5 Wound Status Wound Number: 4 Primary Venous Leg Ulcer Etiology: Wound Location: Left, Lateral Lower Leg Wound Status: Open Wounding Event: Blister Comorbid Cataracts, Hypertension, Type II  Diabetes, Date Acquired: 03/17/2021 History: Rheumatoid Arthritis Weeks Of Treatment: 5 Clustered Wound:  No Photos Wound Measurements Length: (cm) 0.8 Width: (cm) 0.9 Depth: (cm) 0.1 Area: (cm) 0.565 Volume: (cm) 0.057 % Reduction in Area: 94.4% % Reduction in Volume: 94.4% Epithelialization: Medium (34-66%) Tunneling: No Undermining: No Wound Description Classification: Full Thickness Without Exposed Support Structu Exudate Amount: Medium Exudate Type: Serous Exudate Color: amber res Foul Odor After Cleansing: No Slough/Fibrino No Wound Bed Granulation Amount: Large (67-100%) Exposed Structure Granulation Quality: Red Fascia Exposed: No Necrotic Amount: None Present (0%) Fat Layer (Subcutaneous Tissue) Exposed: Yes Tendon Exposed: No Muscle Exposed: No Joint Exposed: No Bone Exposed: No Treatment Notes Wound #4 (Lower Leg) Wound Laterality: Left, Lateral Cleanser Soap and Water Discharge Instruction: Gently cleanse wound with antibacterial soap, rinse and pat dry prior to dressing wounds Peri-Wound Care Triamcinolone Acetonide Cream, 0.1%, 15 (g) tube Kulzer, Allyana K. ([id number]) Discharge Instruction: Apply on reddened areas Topical Primary Dressing SILVERCEL Antimicrobial Alginate Dressing, 1x12 (in/in) Secondary Dressing ABD Pad 5x9 (in/in) Discharge Instruction: Cover with ABD pad Secured With Compression Wrap Profore Lite LF 3 Multilayer Compression Bandaging System Discharge Instruction: Apply 3 multi-layer wrap as prescribed. Compression Stockings Circaid Juxta Lite Compression Wrap Quantity: 1 Left Leg Compression Amount: 30-40 mmHg Right Leg Compression Amount: 30-40 mmHg Discharge Instruction: Apply Circaid Juxta Lite Compression Wrap as directed Add-Ons Electronic Signature(s) Signed: 04/25/2021 11:53:54 AM By: [name] Mouse, Minus Breeding RN Entered By: [name] [name] on 04/25/2021 10:33:27 [name] [name]  ([id number]) -------------------------------------------------------------------------------- Vitals Details Patient Name: [name] [name]. Date of Service: 04/25/2021 10:15 AM Medical Record Number: [id number] Patient Account Number: [id number] Date of Birth/Sex: June 06, 1941 (80 y.o. F) Treating RN: [name] [name] Primary Care Alberto Pina: [name] [name] Other Clinician: Referring Keane Martelli: [name] [name] Treating Kyndall Amero/Extender: [name] [name] in Treatment: 5 Vital Signs Time Taken: 10:24 Temperature (F): 97.7 Height (in): 66 Pulse (bpm): 47 Weight (lbs): 242 Respiratory Rate (breaths/min): 18 Body Mass Index (BMI): 39.1 Blood Pressure (mmHg): 126/77 Reference Range: 80 - 120 mg / dl Electronic Signature(s) Signed: 04/25/2021 11:53:54 AM By: [name] Mouse, Minus Breeding RN Entered By: [name] [name] on 04/25/2021 10:25:50

## 2021-04-25 NOTE — Progress Notes (Addendum)
[name], [name] ([id number]) Visit Report for 04/25/2021 Chief Complaint Document Details Patient Name: [name] [name], SIEDLECKI. Date of Service: 04/25/2021 10:15 AM Medical Record Number: [id number] Patient Account Number: [id number] Date of Birth/Sex: 1941-05-03 (80 y.o. F) Treating RN: Donnamarie Poag Primary Care Provider: Sela Hilding Other Clinician: Referring Provider: Sela Hilding Treating Provider/Extender: Skipper Cliche in Treatment: 5 Information Obtained from: Patient Chief Complaint Bilateral LE Ulcers Electronic Signature(s) Signed: 04/25/2021 10:33:38 AM By: Worthy Keeler PA-C Entered By: Worthy Keeler on 04/25/2021 10:33:38 Crady, [name] ([id number]) -------------------------------------------------------------------------------- HPI Details Patient Name: [name] [name]. Date of Service: 04/25/2021 10:15 AM Medical Record Number: [id number] Patient Account Number: [id number] Date of Birth/Sex: [birth year] (80 y.o. F) Treating RN: Donnamarie Poag Primary Care Provider: Sela Hilding Other Clinician: Referring Provider: Sela Hilding Treating Provider/Extender: Skipper Cliche in Treatment: 5 History of Present Illness HPI Description: 03/17/2021 upon evaluation today patient presents for initial inspection here in the clinic today concerning issues that she has been having with wounds over the bilateral lower extremities. Unfortunately these appear to be venous in nature and have come up fairly insidiously though they have been going on for several months. The patient does have a history of chronic venous insufficiency she has not been wearing any compression prior to this happening. With that being said she also has diabetes that she is not taking any current medications. She also has atrial fibrillation as well as being on long-term anticoagulant therapy. With that being said she is scheduled to have a cardioversion on Monday. Her cardiologist feels like that a lot  of the swelling in her leg stems from the significant amount of Cardiac dysfunction is related to the atrial fibrillation. They are hoping that the cardioversion will help this if it is successful. They are try to hold off on proceeding with any type of ablation until her legs are healed it sounds like to me. 03/29/2021 upon evaluation today patient appears to be doing well in regard to the wounds on her bilateral lower extremities. She is actually making a significant amount of progress in such a short time and a very pleased in that regard. I do think that she needs to continue to monitor for any signs of worsening as far as infection is concerned. This would often be noted obviously by increased pain or drainage right now or see none of that and overall I am extremely pleased with where things stand today. 04/05/2021 upon evaluation today patient appears to be doing well currently in regard to her wounds on the lower extremities. Fortunately there does not appear to be any signs of active infection at this time which is great news and in general and very pleased. I think the right leg is pretty much completely healed the left leg though not completely healed is doing significantly better which is great news. There does not appear to be any evidence of active infection at this time which is excellent as well. 04/18/2021 upon evaluation today patient appears to be doing well with regard to her leg ulcers. She has been tolerating the dressing changes without complication. Fortunately there does not appear to be any signs of active infection at this time. No fevers, chills, nausea, vomiting, or diarrhea. 04/25/2021 upon evaluation today patient appears to be doing excellent in regard to her wound. She has been tolerating the dressing changes without complication. Fortunately there is no evidence of active infection which is great news. No fevers, chills, nausea, vomiting, or diarrhea. The  good news is the  patient did get her juxta lite wraps which she has with her today. Electronic Signature(s) Signed: 04/25/2021 11:12:06 AM By: Worthy Keeler PA-C Entered By: Worthy Keeler on 04/25/2021 11:12:06 [name] [name] ([id number]) -------------------------------------------------------------------------------- Physical Exam Details Patient Name: [name] [name]. Date of Service: 04/25/2021 10:15 AM Medical Record Number: [id number] Patient Account Number: [id number] Date of Birth/Sex: [birth year] (80 y.o. F) Treating RN: Donnamarie Poag Primary Care Provider: Sela Hilding Other Clinician: Referring Provider: Sela Hilding Treating Provider/Extender: Skipper Cliche in Treatment: 5 Constitutional Well-nourished and well-hydrated in no acute distress. Respiratory normal breathing without difficulty. Psychiatric this patient is able to make decisions and demonstrates good insight into disease process. Alert and Oriented x 3. pleasant and cooperative. Notes Patient's wound in the left leg showed signs of doing quite well. There is just very little drainage noted at this point which is great news I think a silver alginate dressing would be excellent for her. Electronic Signature(s) Signed: 04/25/2021 11:13:33 AM By: Worthy Keeler PA-C Entered By: Worthy Keeler on 04/25/2021 11:13:33 Lingafelter, [name] ([id number]) -------------------------------------------------------------------------------- Physician Orders Details Patient Name: [name] [name]. Date of Service: 04/25/2021 10:15 AM Medical Record Number: [id number] Patient Account Number: [id number] Date of Birth/Sex: 04-Jun-1941 (80 y.o. F) Treating RN: Donnamarie Poag Primary Care Provider: Sela Hilding Other Clinician: Referring Provider: Sela Hilding Treating Provider/Extender: Skipper Cliche in Treatment: 5 Verbal / Phone Orders: No Diagnosis Coding ICD-10 Coding Code Description I87.2 Venous insufficiency (chronic)  (peripheral) L97.822 Non-pressure chronic ulcer of other part of left lower leg with fat layer exposed L97.812 Non-pressure chronic ulcer of other part of right lower leg with fat layer exposed E11.622 Type 2 diabetes mellitus with other skin ulcer I48.0 Paroxysmal atrial fibrillation Z79.01 Long term (current) use of anticoagulants Follow-up Appointments o Return Appointment in 1 week. Bathing/ Shower/ Hygiene Wound #4 Left,Lateral Lower Leg o May shower with wound dressing protected with water repellent cover or cast protector. Edema Control - Lymphedema / Segmental Compressive Device / Other Wound #4 Left,Lateral Lower Leg - Right Lower Extremity o Optional: One layer of unna paste to top of compression wrap (to act as an anchor). - LEFT LEG 3 LAYER COMPRESSION WRAPS o 3 Layer Compression System for Lymphedema. - 3 LAYER LEFT LEG/Right leg JUXTALITE o Patient to wear own Velcro compression garment. Remove compression stockings every night before going to bed and put on every morning when getting up. - [place] RIGHT LEG Wound Treatment Wound #4 - Lower Leg Wound Laterality: Left, Lateral Cleanser: Soap and Water 1 x Per Week/15 Days Discharge Instructions: Gently cleanse wound with antibacterial soap, rinse and pat dry prior to dressing wounds Peri-Wound Care: Triamcinolone Acetonide Cream, 0.1%, 15 (g) tube 1 x Per Week/15 Days Discharge Instructions: Apply on reddened areas Primary Dressing: SILVERCEL Antimicrobial Alginate Dressing, 1x12 (in/in) 1 x Per Week/15 Days Secondary Dressing: ABD Pad 5x9 (in/in) 1 x Per Week/15 Days Discharge Instructions: Cover with ABD pad Compression Wrap: Profore Lite LF 3 Multilayer Compression Bandaging System 1 x Per Week/15 Days Discharge Instructions: Apply 3 multi-layer wrap as prescribed. Compression Stockings: Circaid Juxta Lite Compression Wrap Left Leg Compression Amount: 30-40 mmHG Right Leg Compression Amount: 30-40  mmHG Discharge Instructions: Apply Circaid Juxta Lite Compression Wrap as directed Electronic Signature(s) Signed: 04/25/2021 5:00:28 PM By: Donnamarie Poag Signed: 04/25/2021 6:51:50 PM By: Worthy Keeler PA-C Entered By: Donnamarie Poag on 04/25/2021 10:55:45 Viti, [name] ([id number]) Blunck, [name] ([id number]) --------------------------------------------------------------------------------  Problem List Details Patient Name: [name] [name], [name] [name]. Date of Service: 04/25/2021 10:15 AM Medical Record Number: [id number] Patient Account Number: [id number] Date of Birth/Sex: 1941/03/18 (80 y.o. F) Treating RN: Donnamarie Poag Primary Care Provider: Sela Hilding Other Clinician: Referring Provider: Sela Hilding Treating Provider/Extender: Skipper Cliche in Treatment: 5 Active Problems ICD-10 Encounter Code Description Active Date MDM Diagnosis I87.2 Venous insufficiency (chronic) (peripheral) 03/17/2021 No Yes L97.822 Non-pressure chronic ulcer of other part of left lower leg with fat layer 03/17/2021 No Yes exposed L97.812 Non-pressure chronic ulcer of other part of right lower leg with fat layer 03/17/2021 No Yes exposed E11.622 Type 2 diabetes mellitus with other skin ulcer 03/17/2021 No Yes I48.0 Paroxysmal atrial fibrillation 03/17/2021 No Yes Z79.01 Long term (current) use of anticoagulants 03/17/2021 No Yes Inactive Problems Resolved Problems Electronic Signature(s) Signed: 04/25/2021 10:33:32 AM By: Worthy Keeler PA-C Entered By: Worthy Keeler on 04/25/2021 10:33:32 Charrier, [name] ([id number]) -------------------------------------------------------------------------------- Progress Note Details Patient Name: [name] [name]. Date of Service: 04/25/2021 10:15 AM Medical Record Number: [id number] Patient Account Number: [id number] Date of Birth/Sex: [birth year] (80 y.o. F) Treating RN: Donnamarie Poag Primary Care Provider: Sela Hilding Other Clinician: Referring Provider: Sela Hilding Treating Provider/Extender: Skipper Cliche in Treatment: 5 Subjective Chief Complaint Information obtained from Patient Bilateral LE Ulcers History of Present Illness (HPI) 03/17/2021 upon evaluation today patient presents for initial inspection here in the clinic today concerning issues that she has been having with wounds over the bilateral lower extremities. Unfortunately these appear to be venous in nature and have come up fairly insidiously though they have been going on for several months. The patient does have a history of chronic venous insufficiency she has not been wearing any compression prior to this happening. With that being said she also has diabetes that she is not taking any current medications. She also has atrial fibrillation as well as being on long-term anticoagulant therapy. With that being said she is scheduled to have a cardioversion on Monday. Her cardiologist feels like that a lot of the swelling in her leg stems from the significant amount of Cardiac dysfunction is related to the atrial fibrillation. They are hoping that the cardioversion will help this if it is successful. They are try to hold off on proceeding with any type of ablation until her legs are healed it sounds like to me. 03/29/2021 upon evaluation today patient appears to be doing well in regard to the wounds on her bilateral lower extremities. She is actually making a significant amount of progress in such a short time and a very pleased in that regard. I do think that she needs to continue to monitor for any signs of worsening as far as infection is concerned. This would often be noted obviously by increased pain or drainage right now or see none of that and overall I am extremely pleased with where things stand today. 04/05/2021 upon evaluation today patient appears to be doing well currently in regard to her wounds on the lower extremities. Fortunately there does not appear to be any signs of  active infection at this time which is great news and in general and very pleased. I think the right leg is pretty much completely healed the left leg though not completely healed is doing significantly better which is great news. There does not appear to be any evidence of active infection at this time which is excellent as well. 04/18/2021 upon evaluation today patient appears to be doing well with  regard to her leg ulcers. She has been tolerating the dressing changes without complication. Fortunately there does not appear to be any signs of active infection at this time. No fevers, chills, nausea, vomiting, or diarrhea. 04/25/2021 upon evaluation today patient appears to be doing excellent in regard to her wound. She has been tolerating the dressing changes without complication. Fortunately there is no evidence of active infection which is great news. No fevers, chills, nausea, vomiting, or diarrhea. The good news is the patient did get her juxta lite wraps which she has with her today. Objective Constitutional Well-nourished and well-hydrated in no acute distress. Vitals Time Taken: 10:24 AM, Height: 66 in, Weight: 242 lbs, BMI: 39.1, Temperature: 97.7 F, Pulse: 47 bpm, Respiratory Rate: 18 breaths/min, Blood Pressure: 126/77 mmHg. Respiratory normal breathing without difficulty. Psychiatric this patient is able to make decisions and demonstrates good insight into disease process. Alert and Oriented x 3. pleasant and cooperative. General Notes: Patient's wound in the left leg showed signs of doing quite well. There is just very little drainage noted at this point which is great news I think a silver alginate dressing would be excellent for her. Integumentary (Hair, Skin) Wound #4 status is Open. Original cause of wound was Blister. The date acquired was: 03/17/2021. The wound has been in treatment 5 weeks. The wound is located on the Left,Lateral Lower Leg. The wound measures 0.8cm length x  0.9cm width x 0.1cm depth; 0.565cm^2 area and 0.057cm^3 volume. There is Fat Layer (Subcutaneous Tissue) exposed. There is no tunneling or undermining noted. There is a medium amount Moldovan, Maimouna K. ([id number]) of serous drainage noted. There is large (67-100%) red granulation within the wound bed. There is no necrotic tissue within the wound bed. 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 There was a Three Layer Compression Therapy Procedure by Donnamarie Poag, RN. Post procedure Diagnosis Wound #4: Same as Pre-Procedure Plan Follow-up Appointments: Return Appointment in 1 week. Bathing/ Shower/ Hygiene: Wound #4 Left,Lateral Lower Leg: May shower with wound dressing protected with water repellent cover or cast protector. Edema Control - Lymphedema / Segmental Compressive Device / Other: Wound #4 Left,Lateral Lower Leg: Optional: One layer of unna paste to top of compression wrap (to act as an anchor). - LEFT LEG 3 LAYER COMPRESSION WRAPS 3 Layer Compression System for Lymphedema. - 3 LAYER LEFT LEG/Right leg JUXTALITE Patient to wear own Velcro compression garment. Remove compression stockings every night before going to bed and put on every morning when getting up. - JUXTELITE RIGHT LEG WOUND #4: - Lower Leg Wound Laterality: Left, Lateral Cleanser: Soap and Water 1 x Per Week/15 Days Discharge Instructions: Gently cleanse wound with antibacterial soap, rinse and pat dry prior to dressing wounds Peri-Wound Care: Triamcinolone Acetonide Cream, 0.1%, 15 (g)  tube 1 x Per Week/15 Days Discharge Instructions: Apply on reddened areas Primary Dressing: SILVERCEL Antimicrobial Alginate Dressing, 1x12 (in/in) 1 x Per Week/15 Days Secondary Dressing: ABD Pad 5x9 (in/in) 1 x Per Week/15 Days Discharge Instructions: Cover with ABD pad Compression Wrap: Profore Lite LF 3 Multilayer Compression Bandaging System 1 x Per Week/15 Days  Discharge Instructions: Apply 3 multi-layer wrap as prescribed. Compression Stockings: Circaid Juxta Lite Compression Wrap Compression Amount: 30-40 mmHg (left) Compression Amount: 30-40 mmHg (right) Discharge Instructions: Apply Circaid Juxta Lite Compression Wrap as directed 1. With regard to the overall appearance of the patient's wound on the left leg this is almost completely healed. I think a small silver alginate dressing would be appropriate here followed by an ABD pad to cover. 2. With regard to compression I think that we can still continue with the compression wrap on the left leg that were doing here in the office. With that being said she will be using her Velcro compression wrap which I personally showed her how to utilize today here in the office. We will see patient back for reevaluation in 1 week here in the clinic. If anything worsens or changes patient will contact our office for additional recommendations. KEYSI, OELKERS ([id number]) Electronic Signature(s) Signed: 04/25/2021 11:13:40 AM By: Worthy Keeler PA-C Entered By: Worthy Keeler on 04/25/2021 11:13:40 Bon, [name] ([id number]) -------------------------------------------------------------------------------- SuperBill Details Patient Name: [name] [name]. Date of Service: 04/25/2021 Medical Record Number: [id number] Patient Account Number: [id number] Date of Birth/Sex: February 24, 1941 (80 y.o. 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 I48.0 Paroxysmal atrial fibrillation Z79.01 Long term (current) use of anticoagulants Facility Procedures CPT4 Code: [id number] Description: (Facility Use Only) [phone] - [place] LWR LT LEG Modifier: Quantity: 1 Physician Procedures CPT4 Code: [id number] Description: 14481 - WC PHYS LEVEL 3 - EST PT Modifier: Quantity: 1 CPT4 Code: Description: ICD-10 Diagnosis Description I87.2 Venous insufficiency (chronic) (peripheral) L97.812 Non-pressure chronic ulcer of other part of right lower leg with fat la L97.822 Non-pressure chronic ulcer of other part of left lower leg with fat lay  E11.622 Type 2 diabetes mellitus with other skin ulcer Modifier: yer exposed er exposed Quantity: Electronic Signature(s) Signed: 04/25/2021 11:14:10 AM By: Worthy Keeler PA-C Entered By: Worthy Keeler on 04/25/2021 11:14:10

## 2021-05-02 ENCOUNTER — Encounter: Payer: Medicare Other | Admitting: Physician Assistant

## 2021-05-02 ENCOUNTER — Other Ambulatory Visit: Payer: Self-pay

## 2021-05-02 DIAGNOSIS — I480 Paroxysmal atrial fibrillation: Secondary | ICD-10-CM | POA: Diagnosis not present

## 2021-05-02 DIAGNOSIS — L97822 Non-pressure chronic ulcer of other part of left lower leg with fat layer exposed: Secondary | ICD-10-CM | POA: Diagnosis not present

## 2021-05-02 DIAGNOSIS — Z7901 Long term (current) use of anticoagulants: Secondary | ICD-10-CM | POA: Diagnosis not present

## 2021-05-02 DIAGNOSIS — E11622 Type 2 diabetes mellitus with other skin ulcer: Secondary | ICD-10-CM | POA: Diagnosis not present

## 2021-05-02 DIAGNOSIS — L97812 Non-pressure chronic ulcer of other part of right lower leg with fat layer exposed: Secondary | ICD-10-CM | POA: Diagnosis not present

## 2021-05-02 DIAGNOSIS — I872 Venous insufficiency (chronic) (peripheral): Secondary | ICD-10-CM | POA: Diagnosis not present

## 2021-05-02 NOTE — Progress Notes (Addendum)
LUMI, CORSON ([id number]) Visit Report for 05/02/2021 Chief Complaint Document Details Patient Name: [name] [name], [name] [name]. Date of Service: 05/02/2021 2:45 PM Medical Record Number: [id number] Patient Account Number: [id number] Date of Birth/Sex: 08/02/1941 (80 y.o. F) Treating RN: Donnamarie Poag Primary Care Provider: Sela Hilding Other Clinician: Referring Provider: Sela Hilding Treating Provider/Extender: Skipper Cliche in Treatment: 6 Information Obtained from: Patient Chief Complaint Bilateral LE Ulcers Electronic Signature(s) Signed: 05/02/2021 2:45:10 PM By: Worthy Keeler PA-C Entered By: Worthy Keeler on 05/02/2021 14:45:09 [name], [name] ([id number]) -------------------------------------------------------------------------------- HPI Details Patient Name: [name] [name]. Date of Service: 05/02/2021 2:45 PM Medical Record Number: [id number] Patient Account Number: [id number] Date of Birth/Sex: Aug 29, 1941 (80 y.o. F) Treating RN: Donnamarie Poag Primary Care Provider: Sela Hilding Other Clinician: Referring Provider: Sela Hilding Treating Provider/Extender: Skipper Cliche in Treatment: 6 History of Present Illness HPI Description: 03/17/2021 upon evaluation today patient presents for initial inspection here in the clinic today concerning issues that she has been having with wounds over the bilateral lower extremities. Unfortunately these appear to be venous in nature and have come up fairly insidiously though they have been going on for several months. The patient does have a history of chronic venous insufficiency she has not been wearing any compression prior to this happening. With that being said she also has diabetes that she is not taking any current medications. She also has atrial fibrillation as well as being on long-term anticoagulant therapy. With that being said she is scheduled to have a cardioversion on Monday. Her cardiologist feels like that a lot  of the swelling in her leg stems from the significant amount of Cardiac dysfunction is related to the atrial fibrillation. They are hoping that the cardioversion will help this if it is successful. They are try to hold off on proceeding with any type of ablation until her legs are healed it sounds like to me. 03/29/2021 upon evaluation today patient appears to be doing well in regard to the wounds on her bilateral lower extremities. She is actually making a significant amount of progress in such a short time and a very pleased in that regard. I do think that she needs to continue to monitor for any signs of worsening as far as infection is concerned. This would often be noted obviously by increased pain or drainage right now or see none of that and overall I am extremely pleased with where things stand today. 04/05/2021 upon evaluation today patient appears to be doing well currently in regard to her wounds on the lower extremities. Fortunately there does not appear to be any signs of active infection at this time which is great news and in general and very pleased. I think the right leg is pretty much completely healed the left leg though not completely healed is doing significantly better which is great news. There does not appear to be any evidence of active infection at this time which is excellent as well. 04/18/2021 upon evaluation today patient appears to be doing well with regard to her leg ulcers. She has been tolerating the dressing changes without complication. Fortunately there does not appear to be any signs of active infection at this time. No fevers, chills, nausea, vomiting, or diarrhea. 04/25/2021 upon evaluation today patient appears to be doing excellent in regard to her wound. She has been tolerating the dressing changes without complication. Fortunately there is no evidence of active infection which is great news. No fevers, chills, nausea, vomiting, or diarrhea. The  good news is the  patient did get her juxta lite wraps which she has with her today. 05/02/2021 upon evaluation today patient appears to be doing well with regard to her wound on the left leg which is almost completely healed. Fortunately there does not appear to be any signs of active infection which is great news. No fevers, chills, nausea, vomiting, or diarrhea. Unfortunately she does have a small blister on the right leg this is not technically open but nonetheless she tells me the juxta lite wrap just did not seem to do so well for her she did not bring it today but she tells me there is 1 area right where the blister is that she could never really seem to get to compress fully. Electronic Signature(s) Signed: 05/02/2021 4:06:09 PM By: Worthy Keeler PA-C Entered By: Worthy Keeler on 05/02/2021 16:06:09 [name] [name] ([id number]) -------------------------------------------------------------------------------- Physical Exam Details Patient Name: [name] [name]. Date of Service: 05/02/2021 2:45 PM Medical Record Number: [id number] Patient Account Number: [id number] Date of Birth/Sex: 09-06-1941 (80 y.o. F) Treating RN: Donnamarie Poag Primary Care Provider: Sela Hilding Other Clinician: Referring Provider: Sela Hilding Treating Provider/Extender: Skipper Cliche in Treatment: 6 Constitutional Well-nourished and well-hydrated in no acute distress. Respiratory normal breathing without difficulty. Psychiatric this patient is able to make decisions and demonstrates good insight into disease process. Alert and Oriented x 3. pleasant and cooperative. Notes Upon inspection patient's wound bed actually showed signs of good granulation epithelization at this point. There does not appear to be any signs of infection on the left this is actually doing quite well. In regard to the right leg I feel like the patient is doing pretty well as well. Again I do not see any evidence that she is having any  difficulty from the standpoint of infection or otherwise. She does have a small blister but I think that we get this back under control with a good compression wrap. Electronic Signature(s) Signed: 05/02/2021 4:06:50 PM By: Worthy Keeler PA-C Entered By: Worthy Keeler on 05/02/2021 16:06:50 [name] [name], [name] ([id number]) -------------------------------------------------------------------------------- Physician Orders Details Patient Name: [name] [name]. Date of Service: 05/02/2021 2:45 PM Medical Record Number: [id number] Patient Account Number: [id number] Date of Birth/Sex: [birth year] (80 y.o. F) Treating RN: Donnamarie Poag Primary Care Provider: Sela Hilding Other Clinician: Referring Provider: Sela Hilding Treating Provider/Extender: Skipper Cliche in Treatment: 6 Verbal / Phone Orders: No Diagnosis Coding ICD-10 Coding Code Description I87.2 Venous insufficiency (chronic) (peripheral) L97.822 Non-pressure chronic ulcer of other part of left lower leg with fat layer exposed L97.812 Non-pressure chronic ulcer of other part of right lower leg with fat layer exposed E11.622 Type 2 diabetes mellitus with other skin ulcer I48.0 Paroxysmal atrial fibrillation Z79.01 Long term (current) use of anticoagulants Follow-up Appointments o Return Appointment in 1 week. Bathing/ Shower/ Hygiene Wound #4 Left,Lateral Lower Leg o May shower with wound dressing protected with water repellent cover or cast protector. Edema Control - Lymphedema / Segmental Compressive Device / Other Wound #4 Left,Lateral Lower Leg - Bilateral Lower Extremities o Optional: One layer of unna paste to top of compression wrap (to act as an anchor). - BILATERAL 3 LAYER COMPRESSION WRAPS 5/16 o Patient to wear own Velcro compression garment. Remove compression stockings every night before going to bed and put on every morning when getting up. - JUXTELITE --ON HOLD o Elevate, Exercise Daily and Avoid  Standing for Long Periods of Time. o DO YOUR BEST to sleep in  the bed at night. DO NOT sleep in your recliner. Long hours of sitting in a recliner leads to swelling of the legs and/or potential wounds on your backside. Wound Treatment Wound #4 - Lower Leg Wound Laterality: Left, Lateral Cleanser: Soap and Water 1 x Per Week/15 Days Discharge Instructions: Gently cleanse wound with antibacterial soap, rinse and pat dry prior to dressing wounds Peri-Wound Care: Triamcinolone Acetonide Cream, 0.1%, 15 (g) tube 1 x Per Week/15 Days Discharge Instructions: Apply on reddened areas Primary Dressing: SILVERCEL Antimicrobial Alginate Dressing, 1x12 (in/in) 1 x Per Week/15 Days Secondary Dressing: ABD Pad 5x9 (in/in) 1 x Per Week/15 Days Discharge Instructions: Cover with ABD pad Compression Wrap: Profore Lite LF 3 Multilayer Compression Bandaging System 1 x Per Week/15 Days Discharge Instructions: Apply 3 multi-layer wrap as prescribed. Compression Stockings: Circaid Juxta Lite Compression Wrap Left Leg Compression Amount: 30-40 mmHG Right Leg Compression Amount: 30-40 mmHG Discharge Instructions: Apply Circaid Juxta Lite Compression Wrap as directed Electronic Signature(s) Signed: 05/02/2021 5:15:41 PM By: Worthy Keeler PA-C Signed: 05/03/2021 2:35:55 PM By: Stevphen Meuse ([id number]) Entered By: Donnamarie Poag on 05/02/2021 16:06:06 [name] [name], [name] ([id number]) -------------------------------------------------------------------------------- Problem List Details Patient Name: [name] [name]. Date of Service: 05/02/2021 2:45 PM Medical Record Number: [id number] Patient Account Number: [id number] Date of Birth/Sex: 08-Jun-1941 (80 y.o. F) Treating RN: Donnamarie Poag Primary Care Provider: Sela Hilding Other Clinician: Referring Provider: Sela Hilding Treating Provider/Extender: Skipper Cliche in Treatment: 6 Active Problems ICD-10 Encounter Code Description Active Date  MDM Diagnosis I87.2 Venous insufficiency (chronic) (peripheral) 03/17/2021 No Yes L97.822 Non-pressure chronic ulcer of other part of left lower leg with fat layer 03/17/2021 No Yes exposed L97.812 Non-pressure chronic ulcer of other part of right lower leg with fat layer 03/17/2021 No Yes exposed E11.622 Type 2 diabetes mellitus with other skin ulcer 03/17/2021 No Yes I48.0 Paroxysmal atrial fibrillation 03/17/2021 No Yes Z79.01 Long term (current) use of anticoagulants 03/17/2021 No Yes Inactive Problems Resolved Problems Electronic Signature(s) Signed: 05/02/2021 2:45:04 PM By: Worthy Keeler PA-C Entered By: Worthy Keeler on 05/02/2021 14:45:04 [name] [name], [name] ([id number]) -------------------------------------------------------------------------------- Progress Note Details Patient Name: [name] [name]. Date of Service: 05/02/2021 2:45 PM Medical Record Number: [id number] Patient Account Number: [id number] Date of Birth/Sex: [birth year] (80 y.o. F) Treating RN: Donnamarie Poag Primary Care Provider: Sela Hilding Other Clinician: Referring Provider: Sela Hilding Treating Provider/Extender: Skipper Cliche in Treatment: 6 Subjective Chief Complaint Information obtained from Patient Bilateral LE Ulcers History of Present Illness (HPI) 03/17/2021 upon evaluation today patient presents for initial inspection here in the clinic today concerning issues that she has been having with wounds over the bilateral lower extremities. Unfortunately these appear to be venous in nature and have come up fairly insidiously though they have been going on for several months. The patient does have a history of chronic venous insufficiency she has not been wearing any compression prior to this happening. With that being said she also has diabetes that she is not taking any current medications. She also has atrial fibrillation as well as being on long-term anticoagulant therapy. With that being said she  is scheduled to have a cardioversion on Monday. Her cardiologist feels like that a lot of the swelling in her leg stems from the significant amount of Cardiac dysfunction is related to the atrial fibrillation. They are hoping that the cardioversion will help this if it is successful. They are try to hold off on proceeding with any type of ablation  until her legs are healed it sounds like to me. 03/29/2021 upon evaluation today patient appears to be doing well in regard to the wounds on her bilateral lower extremities. She is actually making a significant amount of progress in such a short time and a very pleased in that regard. I do think that she needs to continue to monitor for any signs of worsening as far as infection is concerned. This would often be noted obviously by increased pain or drainage right now or see none of that and overall I am extremely pleased with where things stand today. 04/05/2021 upon evaluation today patient appears to be doing well currently in regard to her wounds on the lower extremities. Fortunately there does not appear to be any signs of active infection at this time which is great news and in general and very pleased. I think the right leg is pretty much completely healed the left leg though not completely healed is doing significantly better which is great news. There does not appear to be any evidence of active infection at this time which is excellent as well. 04/18/2021 upon evaluation today patient appears to be doing well with regard to her leg ulcers. She has been tolerating the dressing changes without complication. Fortunately there does not appear to be any signs of active infection at this time. No fevers, chills, nausea, vomiting, or diarrhea. 04/25/2021 upon evaluation today patient appears to be doing excellent in regard to her wound. She has been tolerating the dressing changes without complication. Fortunately there is no evidence of active infection which  is great news. No fevers, chills, nausea, vomiting, or diarrhea. The good news is the patient did get her juxta lite wraps which she has with her today. 05/02/2021 upon evaluation today patient appears to be doing well with regard to her wound on the left leg which is almost completely healed. Fortunately there does not appear to be any signs of active infection which is great news. No fevers, chills, nausea, vomiting, or diarrhea. Unfortunately she does have a small blister on the right leg this is not technically open but nonetheless she tells me the juxta lite wrap just did not seem to do so well for her she did not bring it today but she tells me there is 1 area right where the blister is that she could never really seem to get to compress fully. Objective Constitutional Well-nourished and well-hydrated in no acute distress. Vitals Time Taken: 3:41 PM, Height: 66 in, Weight: 242 lbs, BMI: 39.1, Temperature: 98 F, Pulse: 56 bpm, Respiratory Rate: 16 breaths/min, Blood Pressure: 130/69 mmHg. Respiratory normal breathing without difficulty. Psychiatric this patient is able to make decisions and demonstrates good insight into disease process. Alert and Oriented x 3. pleasant and cooperative. General Notes: Upon inspection patient's wound bed actually showed signs of good granulation epithelization at this point. There does not appear [name] [name], [name] K. ([id number]) to be any signs of infection on the left this is actually doing quite well. In regard to the right leg I feel like the patient is doing pretty well as well. Again I do not see any evidence that she is having any difficulty from the standpoint of infection or otherwise. She does have a small blister but I think that we get this back under control with a good compression wrap. Integumentary (Hair, Skin) Wound #4 status is Open. Original cause of wound was Blister. The date acquired was: 03/17/2021. The wound has been in treatment 6  weeks. The wound is located on the Left,Lateral Lower Leg. The wound measures 0.2cm length x 0.2cm width x 0.1cm depth; 0.031cm^2 area and 0.003cm^3 volume. There is Fat Layer (Subcutaneous Tissue) exposed. There is no tunneling or undermining noted. There is a medium amount of serous drainage noted. There is large (67-100%) red granulation within the wound bed. There is no necrotic tissue within the wound bed. 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 There was a Three Layer Compression Therapy Procedure by Donnamarie Poag, RN. Post procedure Diagnosis Wound #4: Same as Pre-Procedure Plan Follow-up Appointments: Return Appointment in 1 week. Bathing/ Shower/ Hygiene: Wound #4 Left,Lateral Lower Leg: May shower with wound dressing protected with water repellent cover or cast protector. Edema Control - Lymphedema / Segmental Compressive Device / Other: Wound #4 Left,Lateral Lower Leg: Optional: One layer of unna paste to top of compression wrap (to act as an anchor). - BILATERAL 3 LAYER COMPRESSION WRAPS 5/16 Patient to wear own Velcro compression garment. Remove compression stockings every night before going to bed and put on every morning when getting up. - JUXTELITE --ON HOLD Elevate, Exercise Daily and Avoid Standing for Long Periods of Time. DO YOUR BEST to sleep in the bed at night. DO NOT sleep in your recliner. Long hours of sitting in a recliner leads to swelling of the legs and/or potential wounds on your backside. WOUND #4: - Lower Leg Wound  Laterality: Left, Lateral Cleanser: Soap and Water 1 x Per Week/15 Days Discharge Instructions: Gently cleanse wound with antibacterial soap, rinse and pat dry prior to dressing wounds Peri-Wound Care: Triamcinolone Acetonide Cream, 0.1%, 15 (g) tube 1 x Per Week/15 Days Discharge Instructions: Apply on reddened areas Primary Dressing: SILVERCEL Antimicrobial Alginate Dressing, 1x12 (in/in) 1 x Per Week/15 Days Secondary Dressing: ABD Pad 5x9 (in/in) 1 x Per Week/15 Days Discharge Instructions: Cover with ABD pad Compression Wrap: Profore Lite LF 3 Multilayer Compression Bandaging System 1 x Per Week/15 Days Discharge Instructions: Apply 3 multi-layer wrap as prescribed. Compression Stockings: Circaid Juxta Lite Compression Wrap Compression Amount: 30-40 mmHg (left) Compression Amount: 30-40 mmHg (right) Discharge Instructions: Apply Circaid Juxta Lite Compression Wrap as directed Gutierres, [name] K. ([id number]) 1. I would recommend currently that we have the patient go ahead and continue with the wound care measures as before specifically with regard to the silver alginate which I think is doing a good job for her. 2. I am also going to recommend at this time that we have the patient continue with the ABD pad to cover followed by the 3 layer compression wrap. I think that is still doing a good job. 3. I also recommend the patient continue to elevate her legs much as possible to help with edema control. We will see patient back for reevaluation in 1 week here in the clinic. If anything worsens or changes patient will contact our office for additional recommendations. Electronic Signature(s) Signed: 05/02/2021 4:08:24 PM By: Worthy Keeler PA-C Entered By: Worthy Keeler on 05/02/2021 16:08:24 [name] [name] ([id number]) -------------------------------------------------------------------------------- SuperBill Details Patient Name: [name] [name]. Date of Service: 05/02/2021 Medical Record  Number:  [id number] Patient Account Number: [id number] Date of Birth/Sex: 08/17/1941 (80 y.o. 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 Physician Procedures CPT4 Code: [id number] Description: [id number] - WC PHYS LEVEL 3 - EST PT Modifier: Quantity: 1 CPT4 Code: Description: ICD-10 Diagnosis Description I87.2 Venous insufficiency (chronic) (peripheral) L97.822 Non-pressure chronic ulcer of other part of left lower leg with fat lay L97.812 Non-pressure chronic ulcer of other part of right lower leg with fat la  E11.622 Type 2 diabetes mellitus with other skin ulcer Modifier: er exposed yer exposed Quantity: Electronic Signature(s) Signed: 05/02/2021 4:16:46 PM By: Worthy Keeler PA-C Entered By: Worthy Keeler on 05/02/2021 16:16:45

## 2021-05-02 NOTE — Progress Notes (Addendum)
VERIA, STRADLEY ([id number]) Visit Report for 05/02/2021 Arrival Information Details Patient Name: [name], [name]. Date of Service: 05/02/2021 2:45 PM Medical Record Number: [id number] Patient Account Number: [id number] Date of Birth/Sex: 04/09/1941 (80 y.o. F) Treating RN: Carlene Coria Primary Care Titan Karner: Sela Hilding Other Clinician: Referring Hoover Grewe: Sela Hilding Treating Honest Safranek/Extender: Skipper Cliche in Treatment: 6 Visit Information History Since Last Visit All ordered tests and consults were completed: No Patient Arrived: Ambulatory Added or deleted any medications: No Arrival Time: 15:38 Any new allergies or adverse reactions: No Accompanied By: husband Had a fall or experienced change in No Transfer Assistance: None activities of daily living that may affect Patient Identification Verified: Yes risk of falls: Secondary Verification Process Completed: Yes Signs or symptoms of abuse/neglect since last visito No Patient Requires Transmission-Based No Hospitalized since last visit: No Precautions: Implantable device outside of the clinic excluding No Patient Has Alerts: Yes cellular tissue based products placed in the center Patient Alerts: Patient on Blood since last visit: Thinner Has Dressing in Place as Prescribed: Yes type ll diabetic Has Compression in Place as Prescribed: Yes Pain Present Now: No Electronic Signature(s) Signed: 05/02/2021 8:20:21 PM By: Carlene Coria RN Entered By: Carlene Coria on 05/02/2021 15:41:05 [name], [name] ([id number]) -------------------------------------------------------------------------------- Clinic Level of Care Assessment Details Patient Name: [name] [name]. Date of Service: 05/02/2021 2:45 PM Medical Record Number: [id number] Patient Account Number: [id number] Date of Birth/Sex: 10-13-1941 (80 y.o. F) Treating RN: Donnamarie Poag Primary Care Jeris Easterly: Sela Hilding Other Clinician: Referring Keyasha Miah:  Sela Hilding Treating Dmitriy Gair/Extender: Skipper Cliche in Treatment: 6 Clinic Level of Care Assessment Items TOOL 1 Quantity Score []  - Use when EandM and Procedure is performed on INITIAL visit 0 ASSESSMENTS - Nursing Assessment / Reassessment []  - General Physical Exam (combine w/ comprehensive assessment (listed just below) when performed on new 0 pt. evals) []  - 0 Comprehensive Assessment (HX, ROS, Risk Assessments, Wounds Hx, etc.) ASSESSMENTS - Wound and Skin Assessment / Reassessment []  - Dermatologic / Skin Assessment (not related to wound area) 0 ASSESSMENTS - Ostomy and/or Continence Assessment and Care []  - Incontinence Assessment and Management 0 []  - 0 Ostomy Care Assessment and Management (repouching, etc.) PROCESS - Coordination of Care []  - Simple Patient / Family Education for ongoing care 0 []  - 0 Complex (extensive) Patient / Family Education for ongoing care []  - 0 Staff obtains Programmer, systems, Records, Test Results / Process Orders []  - 0 Staff telephones HHA, Nursing Homes / Clarify orders / etc []  - 0 Routine Transfer to another Facility (non-emergent condition) []  - 0 Routine Hospital Admission (non-emergent condition) []  - 0 New Admissions / Biomedical engineer / Ordering NPWT, Apligraf, etc. []  - 0 Emergency Hospital Admission (emergent condition) PROCESS - Special Needs []  - Pediatric / Minor Patient Management 0 []  - 0 Isolation Patient Management []  - 0 Hearing / Language / Visual special needs []  - 0 Assessment of Community assistance (transportation, D/C planning, etc.) []  - 0 Additional assistance / Altered mentation []  - 0 Support Surface(s) Assessment (bed, cushion, seat, etc.) INTERVENTIONS - Miscellaneous []  - External ear exam 0 []  - 0 Patient Transfer (multiple staff / Civil Service fast streamer / Similar devices) []  - 0 Simple Staple / Suture removal (25 or less) []  - 0 Complex Staple / Suture removal (26 or more) []  -  0 Hypo/Hyperglycemic Management (do not check if billed separately) []  - 0 Ankle / Brachial Index (ABI) - do not check if billed separately Has the  patient been seen at the hospital within the last three years: Yes Total Score: 0 Level Of Care: ____ [name] [name] ([id number]) Electronic Signature(s) Signed: 05/03/2021 2:35:55 PM By: Donnamarie Poag Entered By: Donnamarie Poag on 05/02/2021 16:06:13 [name], [name] ([id number]) -------------------------------------------------------------------------------- Compression Therapy Details Patient Name: [name] [name]. Date of Service: 05/02/2021 2:45 PM Medical Record Number: [id number] Patient Account Number: [id number] Date of Birth/Sex: November 21, 1941 (80 y.o. F) Treating RN: Donnamarie Poag Primary Care Naeem Quillin: Sela Hilding Other Clinician: Referring Sekai Nayak: Sela Hilding Treating Tempest Frankland/Extender: Skipper Cliche in Treatment: 6 Compression Therapy Performed for Wound Assessment: Wound #4 Left,Lateral Lower Leg Performed By: Clinician Donnamarie Poag, RN Compression Type: Three Layer Post Procedure Diagnosis Same as Pre-procedure Electronic Signature(s) Signed: 05/03/2021 2:35:55 PM By: Donnamarie Poag Entered By: Donnamarie Poag on 05/02/2021 16:02:07 [name] [name] ([id number]) -------------------------------------------------------------------------------- Encounter Discharge Information Details Patient Name: [name] [name]. Date of Service: 05/02/2021 2:45 PM Medical Record Number: [id number] Patient Account Number: [id number] Date of Birth/Sex: [birth year] (80 y.o. F) Treating RN: Donnamarie Poag Primary Care Kassim Guertin: Sela Hilding Other Clinician: Referring Hattie Aguinaldo: Sela Hilding Treating Shauntee Karp/Extender: Skipper Cliche in Treatment: 6 Encounter Discharge Information Items Discharge Condition: Stable Ambulatory Status: Ambulatory Discharge Destination: Home Transportation: Private Auto Accompanied By: husband Schedule  Follow-up Appointment: Yes Clinical Summary of Care: Electronic Signature(s) Signed: 05/02/2021 5:31:07 PM By: Jeanine Luz Entered By: Jeanine Luz on 05/02/2021 16:47:29 Escorcia, [name] ([id number]) -------------------------------------------------------------------------------- Lower Extremity Assessment Details Patient Name: [name] [name]. Date of Service: 05/02/2021 2:45 PM Medical Record Number: [id number] Patient Account Number: [id number] Date of Birth/Sex: 09-Oct-1941 (80 y.o. F) Treating RN: Carlene Coria Primary Care Ernest Popowski: Sela Hilding Other Clinician: Referring Varina Hulon: Sela Hilding Treating Anay Walter/Extender: Skipper Cliche in Treatment: 6 Edema Assessment Assessed: [Left: No] [Right: No] [Left: Edema] [Right: :] Calf Left: Right: Point of Measurement: 35 cm From Medial Instep 41 cm Ankle Left: Right: Point of Measurement: 9 cm From Medial Instep 21 cm Vascular Assessment Pulses: Dorsalis Pedis Palpable: [Left:Yes] Electronic Signature(s) Signed: 05/02/2021 8:20:21 PM By: Carlene Coria RN Entered By: Carlene Coria on 05/02/2021 15:48:10 [name], [name] ([id number]) -------------------------------------------------------------------------------- Multi Wound Chart Details Patient Name: [name] [name]. Date of Service: 05/02/2021 2:45 PM Medical Record Number: [id number] Patient Account Number: [id number] Date of Birth/Sex: 09/04/1941 (80 y.o. F) Treating RN: Donnamarie Poag Primary Care Cayden Rautio: Sela Hilding Other Clinician: Referring Kodiak Rollyson: Sela Hilding Treating Annely Sliva/Extender: Skipper Cliche in Treatment: 6 Vital Signs Height(in): 52 Pulse(bpm): 82 Weight(lbs): 242 Blood Pressure(mmHg): 130/69 Body Mass Index(BMI): 39 Temperature(F): 98 Respiratory Rate(breaths/min): 16 Photos: [N/A:N/A] Wound Location: Left, Lateral Lower Leg N/A N/A Wounding Event: Blister N/A N/A Primary Etiology: Venous Leg Ulcer N/A N/A Comorbid  History: Cataracts, Hypertension, Type II N/A N/A Diabetes, Rheumatoid Arthritis Date Acquired: 03/17/2021 N/A N/A Weeks of Treatment: 6 N/A N/A Wound Status: Open N/A N/A Measurements L x W x D (cm) 0.2x0.2x0.1 N/A N/A Area (cm) : 0.031 N/A N/A Volume (cm) : 0.003 N/A N/A % Reduction in Area: 99.70% N/A N/A % Reduction in Volume: 99.70% N/A N/A Classification: Full Thickness Without Exposed N/A N/A Support Structures Exudate Amount: Medium N/A N/A Exudate Type: Serous N/A N/A Exudate Color: amber N/A N/A Granulation Amount: Large (67-100%) N/A N/A Granulation Quality: Red N/A N/A Necrotic Amount: None Present (0%) N/A N/A Exposed Structures: Fat Layer (Subcutaneous Tissue): N/A N/A Yes Fascia: No Tendon: No Muscle: No Joint: No Bone: No Epithelialization: Medium (34-66%) N/A N/A Treatment Notes Electronic Signature(s) Signed: 05/03/2021 2:35:55 PM By: Lyndel Safe,  Joy Entered By: Donnamarie Poag on 05/02/2021 15:59:54 [name] [name] ([id number]) -------------------------------------------------------------------------------- [place] Details Patient Name: JISELE, PRICE. Date of Service: 05/02/2021 2:45 PM Medical Record Number: [id number] Patient Account Number: [id number] Date of Birth/Sex: 1941/12/11 (80 y.o. F) Treating RN: Donnamarie Poag Primary Care Antonios Ostrow: Sela Hilding Other Clinician: Referring Tiffanny Lamarche: Sela Hilding Treating Pama Roskos/Extender: Skipper Cliche in Treatment: 6 Active Inactive Wound/Skin Impairment Nursing Diagnoses: Impaired tissue integrity Goals: Patient/caregiver will verbalize understanding of skin care regimen Date Initiated: 03/17/2021 Date Inactivated: 03/29/2021 Target Resolution Date: 03/17/2021 Goal Status: Met Ulcer/skin breakdown will have a volume reduction of 30% by week 4 Date Initiated: 03/17/2021 Date Inactivated: 04/05/2021 Target Resolution Date: 04/16/2021 Goal Status: Met Ulcer/skin breakdown will  have a volume reduction of 50% by week 8 Date Initiated: 03/17/2021 Date Inactivated: 05/02/2021 Target Resolution Date: 05/16/2021 Goal Status: Met Ulcer/skin breakdown will have a volume reduction of 80% by week 12 Date Initiated: 03/17/2021 Target Resolution Date: 06/16/2021 Goal Status: Active Ulcer/skin breakdown will heal within 14 weeks Date Initiated: 03/17/2021 Target Resolution Date: 07/16/2021 Goal Status: Active Interventions: Assess patient/caregiver ability to obtain necessary supplies Assess patient/caregiver ability to perform ulcer/skin care regimen upon admission and as needed Assess ulceration(s) every visit Provide education on ulcer and skin care Treatment Activities: Referred to DME Ayame Rena for dressing supplies : 03/17/2021 Skin care regimen initiated : 03/17/2021 Notes: Electronic Signature(s) Signed: 05/03/2021 2:35:55 PM By: Donnamarie Poag Entered By: Donnamarie Poag on 05/02/2021 15:59:45 [name], [name] ([id number]) -------------------------------------------------------------------------------- Pain Assessment Details Patient Name: [name] [name]. Date of Service: 05/02/2021 2:45 PM Medical Record Number: [id number] Patient Account Number: [id number] Date of Birth/Sex: [birth year] (80 y.o. F) Treating RN: Carlene Coria Primary Care Jeena Arnett: Sela Hilding Other Clinician: Referring Jessah Danser: Sela Hilding Treating Reina Wilton/Extender: Skipper Cliche in Treatment: 6 Active Problems Location of Pain Severity and Description of Pain Patient Has Paino No Site Locations Pain Management and Medication Current Pain Management: Electronic Signature(s) Signed: 05/02/2021 8:20:21 PM By: Carlene Coria RN Entered By: Carlene Coria on 05/02/2021 15:41:36 [name], [name] ([id number]) -------------------------------------------------------------------------------- Patient/Caregiver Education Details Patient Name: [name] [name]. Date of Service: 05/02/2021 2:45 PM Medical  Record Number: [id number] Patient Account Number: [id number] Date of Birth/Gender: [birth year] (80 y.o. F) Treating RN: Donnamarie Poag Primary Care Physician: Sela Hilding Other Clinician: Referring Physician: Sela Hilding Treating Physician/Extender: Skipper Cliche in Treatment: 6 Education Assessment Education Provided To: Patient Education Topics Provided Basic Hygiene: Infection: Venous: Wound/Skin Impairment: Electronic Signature(s) Signed: 05/03/2021 2:35:55 PM By: Donnamarie Poag Entered By: Donnamarie Poag on 05/02/2021 16:00:16 [name], [name] ([id number]) -------------------------------------------------------------------------------- Wound Assessment Details Patient Name: [name] [name]. Date of Service: 05/02/2021 2:45 PM Medical Record Number: [id number] Patient Account Number: [id number] Date of Birth/Sex: May 24, 1941 (80 y.o. F) Treating RN: Carlene Coria Primary Care Frazier Balfour: Sela Hilding Other Clinician: Referring Carlus Stay: Sela Hilding Treating Naviah Belfield/Extender: Skipper Cliche in Treatment: 6 Wound Status Wound Number: 4 Primary Venous Leg Ulcer Etiology: Wound Location: Left, Lateral Lower Leg Wound Status: Open Wounding Event: Blister Comorbid Cataracts, Hypertension, Type II Diabetes, Date Acquired: 03/17/2021 History: Rheumatoid Arthritis Weeks Of Treatment: 6 Clustered Wound: No Photos Wound Measurements Length: (cm) 0.2 Width: (cm) 0.2 Depth: (cm) 0.1 Area: (cm) 0.031 Volume: (cm) 0.003 % Reduction in Area: 99.7% % Reduction in Volume: 99.7% Epithelialization: Medium (34-66%) Tunneling: No Undermining: No Wound Description Classification: Full Thickness Without Exposed Support Structu Exudate Amount: Medium Exudate Type: Serous Exudate Color: amber res Foul Odor After Cleansing: No Slough/Fibrino No Wound Bed  Granulation Amount: Large (67-100%) Exposed Structure Granulation Quality: Red Fascia Exposed: No Necrotic  Amount: None Present (0%) Fat Layer (Subcutaneous Tissue) Exposed: Yes Tendon Exposed: No Muscle Exposed: No Joint Exposed: No Bone Exposed: No Treatment Notes Wound #4 (Lower Leg) Wound Laterality: Left, Lateral Cleanser Soap and Water Discharge Instruction: Gently cleanse wound with antibacterial soap, rinse and pat dry prior to dressing wounds Peri-Wound Care Triamcinolone Acetonide Cream, 0.1%, 15 (g) tube [name], [name] K. ([id number]) Discharge Instruction: Apply on reddened areas Topical Primary Dressing SILVERCEL Antimicrobial Alginate Dressing, 1x12 (in/in) Secondary Dressing ABD Pad 5x9 (in/in) Discharge Instruction: Cover with ABD pad Secured With Compression Wrap Profore Lite LF 3 Multilayer Compression Bandaging System Discharge Instruction: Apply 3 multi-layer wrap as prescribed. Compression Stockings Circaid Juxta Lite Compression Wrap Quantity: 1 Left Leg Compression Amount: 30-40 mmHg Right Leg Compression Amount: 30-40 mmHg Discharge Instruction: Apply Circaid Juxta Lite Compression Wrap as directed Add-Ons Electronic Signature(s) Signed: 05/02/2021 8:20:21 PM By: Carlene Coria RN Entered By: Carlene Coria on 05/02/2021 15:47:32 [name], [name] ([id number]) -------------------------------------------------------------------------------- Vitals Details Patient Name: [name] [name]. Date of Service: 05/02/2021 2:45 PM Medical Record Number: [id number] Patient Account Number: [id number] Date of Birth/Sex: 12-Apr-1941 (80 y.o. F) Treating RN: Carlene Coria Primary Care Zorawar Strollo: Sela Hilding Other Clinician: Referring Kynzi Levay: Sela Hilding Treating Rane Dumm/Extender: Skipper Cliche in Treatment: 6 Vital Signs Time Taken: 15:41 Temperature (F): 98 Height (in): 66 Pulse (bpm): 56 Weight (lbs): 242 Respiratory Rate (breaths/min): 16 Body Mass Index (BMI): 39.1 Blood Pressure (mmHg): 130/69 Reference Range: 80 - 120 mg / dl Electronic  Signature(s) Signed: 05/02/2021 8:20:21 PM By: Carlene Coria RN Entered By: Carlene Coria on 05/02/2021 15:41:27

## 2021-05-09 ENCOUNTER — Encounter: Payer: Medicare Other | Admitting: Internal Medicine

## 2021-05-09 ENCOUNTER — Other Ambulatory Visit: Payer: Self-pay

## 2021-05-09 DIAGNOSIS — I480 Paroxysmal atrial fibrillation: Secondary | ICD-10-CM | POA: Diagnosis not present

## 2021-05-09 DIAGNOSIS — L97822 Non-pressure chronic ulcer of other part of left lower leg with fat layer exposed: Secondary | ICD-10-CM | POA: Diagnosis not present

## 2021-05-09 DIAGNOSIS — E11622 Type 2 diabetes mellitus with other skin ulcer: Secondary | ICD-10-CM | POA: Diagnosis not present

## 2021-05-09 DIAGNOSIS — I872 Venous insufficiency (chronic) (peripheral): Secondary | ICD-10-CM | POA: Diagnosis not present

## 2021-05-09 DIAGNOSIS — Z7901 Long term (current) use of anticoagulants: Secondary | ICD-10-CM | POA: Diagnosis not present

## 2021-05-09 DIAGNOSIS — L97812 Non-pressure chronic ulcer of other part of right lower leg with fat layer exposed: Secondary | ICD-10-CM | POA: Diagnosis not present

## 2021-05-11 NOTE — Progress Notes (Signed)
PRISCILLIA, FOUCH ([id number]) Visit Report for 05/09/2021 HPI Details Patient Name: [name] [name], [name] [name]. Date of Service: 05/09/2021 12:45 PM Medical Record Number: [id number] Patient Account Number: [id number] Date of Birth/Sex: [birth year] (80 y.o. F) Treating RN: Primary Care Provider: Sela Hilding Other Clinician: Referring Provider: Sela Hilding Treating Provider/Extender: Tito Dine in Treatment: 7 History of Present Illness HPI Description: 03/17/2021 upon evaluation today patient presents for initial inspection here in the clinic today concerning issues that she has been having with wounds over the bilateral lower extremities. Unfortunately these appear to be venous in nature and have come up fairly insidiously though they have been going on for several months. The patient does have a history of chronic venous insufficiency she has not been wearing any compression prior to this happening. With that being said she also has diabetes that she is not taking any current medications. She also has atrial fibrillation as well as being on long-term anticoagulant therapy. With that being said she is scheduled to have a cardioversion on Monday. Her cardiologist feels like that a lot of the swelling in her leg stems from the significant amount of Cardiac dysfunction is related to the atrial fibrillation. They are hoping that the cardioversion will help this if it is successful. They are try to hold off on proceeding with any type of ablation until her legs are healed it sounds like to me. 03/29/2021 upon evaluation today patient appears to be doing well in regard to the wounds on her bilateral lower extremities. She is actually making a significant amount of progress in such a short time and a very pleased in that regard. I do think that she needs to continue to monitor for any signs of worsening as far as infection is concerned. This would often be noted obviously by increased pain or  drainage right now or see none of that and overall I am extremely pleased with where things stand today. 04/05/2021 upon evaluation today patient appears to be doing well currently in regard to her wounds on the lower extremities. Fortunately there does not appear to be any signs of active infection at this time which is great news and in general and very pleased. I think the right leg is pretty much completely healed the left leg though not completely healed is doing significantly better which is great news. There does not appear to be any evidence of active infection at this time which is excellent as well. 04/18/2021 upon evaluation today patient appears to be doing well with regard to her leg ulcers. She has been tolerating the dressing changes without complication. Fortunately there does not appear to be any signs of active infection at this time. No fevers, chills, nausea, vomiting, or diarrhea. 04/25/2021 upon evaluation today patient appears to be doing excellent in regard to her wound. She has been tolerating the dressing changes without complication. Fortunately there is no evidence of active infection which is great news. No fevers, chills, nausea, vomiting, or diarrhea. The good news is the patient did get her juxta lite wraps which she has with her today. 05/02/2021 upon evaluation today patient appears to be doing well with regard to her wound on the left leg which is almost completely healed. Fortunately there does not appear to be any signs of active infection which is great news. No fevers, chills, nausea, vomiting, or diarrhea. Unfortunately she does have a small blister on the right leg this is not technically open but nonetheless she tells me the juxta  lite wrap just did not seem to do so well for her she did not bring it today but she tells me there is 1 area right where the blister is that she could never really seem to get to compress fully. 5/23; left leg wound is indeed healed  today. UNFORTUNATELY she has more swelling and an increase open area on the right anterior mid tibia. Surrounding erythema here but with no pain or tenderness. We had wrapped the right leg last week and a 3 layer compression. She has bilateral external compression stockings and waiting for the eventuality that the legs heal Electronic Signature(s) Signed: 05/09/2021 3:42:13 PM By: Linton Ham MD Entered By: Linton Ham on 05/09/2021 13:13:00 [name] [name], [name] [name] ([id number]) -------------------------------------------------------------------------------- Physical Exam Details Patient Name: [name] [name]. Date of Service: 05/09/2021 12:45 PM Medical Record Number: [id number] Patient Account Number: [id number] Date of Birth/Sex: 04/25/1941 (80 y.o. F) Treating RN: Primary Care Provider: Sela Hilding Other Clinician: Referring Provider: Sela Hilding Treating Provider/Extender: Tito Dine in Treatment: 7 Constitutional Patient is hypertensive.. Pulse regular and within target range for patient.[name] Respirations regular, non-labored and within target range.. Temperature is normal and within the target range for the patient.[name] appears in no distress. Cardiovascular Pedal pulses palpable and strong bilaterally.. Notes Wound exam; the patient's left leg has good edema control and all her wounds are healed. o Unfortunately has a new open area on the right anterior mid tibia. A fair amount of swelling in the area of erythema. There is no doubt there is more edema in the right leg than the left. No palpable tenderness Electronic Signature(s) Signed: 05/09/2021 3:42:13 PM By: Linton Ham MD Entered By: Linton Ham on 05/09/2021 13:14:04 [name] [name], [name] [name] ([id number]) -------------------------------------------------------------------------------- Physician Orders Details Patient Name: [name] [name]. Date of Service: 05/09/2021 12:45 PM Medical Record Number: [id number] Patient  Account Number: [id number] Date of Birth/Sex: [birth year] (80 y.o. F) Treating RN: Cornell Barman Primary Care Provider: Sela Hilding Other Clinician: Referring Provider: Sela Hilding Treating Provider/Extender: Tito Dine in Treatment: 7 Verbal / Phone Orders: No Diagnosis Coding Follow-up Appointments o Return Appointment in 1 week. Bathing/ Shower/ Hygiene o May shower with wound dressing protected with water repellent cover or cast protector. Edema Control - Lymphedema / Segmental Compressive Device / Other Left Lower Extremity o Optional: One layer of unna paste to top of compression wrap (to act as an anchor). - Right o Patient to wear own Velcro compression garment. Remove compression stockings every night before going to bed and put on every morning when getting up. - Left o Elevate, Exercise Daily and Avoid Standing for Long Periods of Time. o Elevate legs to the level of the heart and pump ankles as often as possible o Elevate leg(s) parallel to the floor when sitting. Wound Treatment Wound #5 - Lower Leg Wound Laterality: Right, Anterior Peri-Wound Care: Triamcinolone Acetonide Cream, 0.1%, 15 (g) tube Discharge Instructions: red areas on right leg Primary Dressing: Silvercel Small 2x2 (in/in) Discharge Instructions: Apply Silvercel Small 2x2 (in/in) as instructed Secondary Dressing: ABD Pad 5x9 (in/in) Discharge Instructions: Cover with ABD pad Compression Wrap: Medichoice 4 layer Compression System, 35-40 mmHG Discharge Instructions: Apply multi-layer wrap as directed. Electronic Signature(s) Signed: 05/09/2021 3:42:13 PM By: Linton Ham MD Signed: 05/10/2021 5:46:47 PM By: Gretta Cool BSN, RN, CWS, Kim RN, BSN Entered By: Gretta Cool, BSN, RN, CWS, Kim on 05/09/2021 13:12:49 [name] [name] ([id number]) -------------------------------------------------------------------------------- Problem List Details Patient Name: NIKI, PAYMENT. Date of  Service: 05/09/2021  12:45 PM Medical Record Number: [id number] Patient Account Number: [id number] Date of Birth/Sex: 06-03-1941 (80 y.o. F) Treating RN: Primary Care Provider: Sela Hilding Other Clinician: Referring Provider: Sela Hilding Treating Provider/Extender: Tito Dine in Treatment: 7 Active Problems ICD-10 Encounter Code Description Active Date MDM Diagnosis I87.2 Venous insufficiency (chronic) (peripheral) 03/17/2021 No Yes L97.822 Non-pressure chronic ulcer of other part of left lower leg with fat layer 03/17/2021 No Yes exposed L97.812 Non-pressure chronic ulcer of other part of right lower leg with fat layer 03/17/2021 No Yes exposed E11.622 Type 2 diabetes mellitus with other skin ulcer 03/17/2021 No Yes I48.0 Paroxysmal atrial fibrillation 03/17/2021 No Yes Z79.01 Long term (current) use of anticoagulants 03/17/2021 No Yes Inactive Problems Resolved Problems Electronic Signature(s) Signed: 05/09/2021 3:42:13 PM By: Linton Ham MD Entered By: Linton Ham on 05/09/2021 13:11:47 [name] [name], [name] [name] ([id number]) -------------------------------------------------------------------------------- Progress Note Details Patient Name: [name] [name]. Date of Service: 05/09/2021 12:45 PM Medical Record Number: [id number] Patient Account Number: [id number] Date of Birth/Sex: [birth year] (80 y.o. F) Treating RN: Primary Care Provider: Sela Hilding Other Clinician: Referring Provider: Sela Hilding Treating Provider/Extender: Tito Dine in Treatment: 7 Subjective History of Present Illness (HPI) 03/17/2021 upon evaluation today patient presents for initial inspection here in the clinic today concerning issues that she has been having with wounds over the bilateral lower extremities. Unfortunately these appear to be venous in nature and have come up fairly insidiously though they have been going on for several months. The patient does have a  history of chronic venous insufficiency she has not been wearing any compression prior to this happening. With that being said she also has diabetes that she is not taking any current medications. She also has atrial fibrillation as well as being on long-term anticoagulant therapy. With that being said she is scheduled to have a cardioversion on Monday. Her cardiologist feels like that a lot of the swelling in her leg stems from the significant amount of Cardiac dysfunction is related to the atrial fibrillation. They are hoping that the cardioversion will help this if it is successful. They are try to hold off on proceeding with any type of ablation until her legs are healed it sounds like to me. 03/29/2021 upon evaluation today patient appears to be doing well in regard to the wounds on her bilateral lower extremities. She is actually making a significant amount of progress in such a short time and a very pleased in that regard. I do think that she needs to continue to monitor for any signs of worsening as far as infection is concerned. This would often be noted obviously by increased pain or drainage right now or see none of that and overall I am extremely pleased with where things stand today. 04/05/2021 upon evaluation today patient appears to be doing well currently in regard to her wounds on the lower extremities. Fortunately there does not appear to be any signs of active infection at this time which is great news and in general and very pleased. I think the right leg is pretty much completely healed the left leg though not completely healed is doing significantly better which is great news. There does not appear to be any evidence of active infection at this time which is excellent as well. 04/18/2021 upon evaluation today patient appears to be doing well with regard to her leg ulcers. She has been tolerating the dressing changes without complication. Fortunately there does not appear to be any  signs of active  infection at this time. No fevers, chills, nausea, vomiting, or diarrhea. 04/25/2021 upon evaluation today patient appears to be doing excellent in regard to her wound. She has been tolerating the dressing changes without complication. Fortunately there is no evidence of active infection which is great news. No fevers, chills, nausea, vomiting, or diarrhea. The good news is the patient did get her juxta lite wraps which she has with her today. 05/02/2021 upon evaluation today patient appears to be doing well with regard to her wound on the left leg which is almost completely healed. Fortunately there does not appear to be any signs of active infection which is great news. No fevers, chills, nausea, vomiting, or diarrhea. Unfortunately she does have a small blister on the right leg this is not technically open but nonetheless she tells me the juxta lite wrap just did not seem to do so well for her she did not bring it today but she tells me there is 1 area right where the blister is that she could never really seem to get to compress fully. 5/23; left leg wound is indeed healed today. UNFORTUNATELY she has more swelling and an increase open area on the right anterior mid tibia. Surrounding erythema here but with no pain or tenderness. We had wrapped the right leg last week and a 3 layer compression. She has bilateral external compression stockings and waiting for the eventuality that the legs heal Objective Constitutional Patient is hypertensive.. Pulse regular and within target range for patient.[name] Respirations regular, non-labored and within target range.. Temperature is normal and within the target range for the patient.[name] appears in no distress. Vitals Time Taken: 12:57 PM, Height: 66 in, Weight: 242 lbs, BMI: 39.1, Temperature: 97.6 F, Pulse: 56 bpm, Respiratory Rate: 18 breaths/min, Blood Pressure: 155/79 mmHg. Cardiovascular Pedal pulses palpable and strong  bilaterally.. General Notes: Wound exam; the patient's left leg has good edema control and all her wounds are healed. Unfortunately has a new open area on the right anterior mid tibia. A fair amount of swelling in the area of erythema. There is no doubt there is more edema in the right leg than the left. No palpable tenderness Integumentary (Hair, Skin) [name] [name], [name] K. ([id number]) Wound #4 status is Open. Original cause of wound was Blister. The date acquired was: 03/17/2021. The wound has been in treatment 7 weeks. The wound is located on the Left,Lateral Lower Leg. The wound measures 0cm length x 0cm width x 0cm depth; 0cm^2 area and 0cm^3 volume. There is no tunneling or undermining noted. There is a none present amount of drainage noted. There is large (67-100%) red granulation within the wound bed. There is no necrotic tissue within the wound bed. Wound #5 status is Open. Original cause of wound was Gradually Appeared. The date acquired was: 05/05/2021. The wound is located on the Right,Anterior Lower Leg. The wound measures 0.5cm length x 0.6cm width x 0.1cm depth; 0.236cm^2 area and 0.024cm^3 volume. There is no tunneling or undermining noted. There is a small amount of serosanguineous drainage noted. There is no granulation within the wound bed. There is no necrotic tissue within the wound bed. 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 There was a Three Layer Compression Therapy Procedure with a pre-treatment ABI of 0.8 by  Cornell Barman, RN. Post procedure Diagnosis Wound #5: Same as Pre-Procedure Plan Follow-up Appointments: Return Appointment in 1 week. Bathing/ Shower/ Hygiene: May shower with wound dressing protected with water repellent cover or cast protector. Edema Control - Lymphedema / Segmental Compressive Device / Other: Optional: One layer of unna paste to top of compression wrap (to act as an anchor). - Right Patient to wear own Velcro compression garment. Remove compression stockings every night before going to bed and put on every morning when getting up. - Left Elevate, Exercise Daily and Avoid Standing for Long Periods of Time. Elevate legs to the level of the heart and pump ankles as often as possible Elevate leg(s) parallel to the floor when sitting. WOUND #5: - Lower Leg Wound Laterality: Right, Anterior Peri-Wound Care: Triamcinolone Acetonide Cream, 0.1%, 15 (g) tube Discharge Instructions: red areas on right leg Primary Dressing: Silvercel Small 2x2 (in/in) Discharge Instructions: Apply Silvercel Small 2x2 (in/in) as instructed Secondary Dressing: ABD Pad 5x9 (in/in) Discharge Instructions: Cover with ABD pad Compression Wrap: Medichoice 4 layer Compression System, 35-40 mmHG Discharge Instructions: Apply multi-layer wrap as directed. 1. We applied silver alginate ABDs and I have increased the 4-layer compression on the right leg 2. On the left we are going to apply her own external compression stocking 3. Liberal TCA to the presumed stasis dermatitis in the right anterior lower leg 4. The patient is to call us if there is undue pain in the right leg [name] [name], [name] [name] ([id number]) Electronic Signature(s) Signed: 05/09/2021 3:42:13 PM By: Linton Ham MD Entered By: Linton Ham on 05/09/2021 13:19:02 [name] [name], [name] [name] ([id number]) -------------------------------------------------------------------------------- SuperBill Details Patient Name: [name] [name]. Date of Service:  05/09/2021 Medical Record Number: [id number] Patient Account Number: [id number] Date of Birth/Sex: 1941-10-27 (80 y.o. F) Treating RN: Cornell Barman Primary Care Provider: Sela Hilding Other Clinician: Referring Provider: Sela Hilding Treating Provider/Extender: Tito Dine in Treatment: 7 Diagnosis Coding ICD-10 Codes Code Description I87.2 Venous insufficiency (chronic) (peripheral) L97.822 Non-pressure chronic ulcer of other part of left lower leg with fat layer exposed L97.812 Non-pressure chronic ulcer of other part of right lower leg with fat layer exposed E11.622 Type 2 diabetes mellitus with other skin ulcer I48.0 Paroxysmal atrial fibrillation Z79.01 Long term (current) use of anticoagulants Facility Procedures CPT4 Code: [id number] Description: (Facility Use Only) [phone] - [facility] RT LEG Modifier: Quantity: 1 Physician Procedures CPT4 Code: [id number] Description: 81157 - WC PHYS LEVEL 3 - EST PT Modifier: Quantity: 1 CPT4 Code: Description: ICD-10 Diagnosis Description W62.035 Non-pressure chronic ulcer of other part of right lower leg with fat la L97.822 Non-pressure chronic ulcer of other part of left lower leg with fat lay I87.2 Venous insufficiency (chronic) (peripheral) Modifier: yer exposed er exposed Quantity: Electronic Signature(s) Signed: 05/09/2021 3:42:13 PM By: Linton Ham MD Entered By: Linton Ham on 05/09/2021 13:19:27

## 2021-05-12 NOTE — Progress Notes (Signed)
FENDI, MEINHARDT ([id number]) Visit Report for 05/09/2021 Arrival Information Details Patient Name: [name] [name], [name] [name]. Date of Service: 05/09/2021 12:45 PM Medical Record Number: [id number] Patient Account Number: [id number] Date of Birth/Sex: 1941-10-21 (80 y.o. F) Treating RN: Carlene Coria Primary Care Anwar Crill: Sela Hilding Other Clinician: Referring Taggert Bozzi: Sela Hilding Treating Rochel Privett/Extender: Tito Dine in Treatment: 7 Visit Information History Since Last Visit All ordered tests and consults were completed: No Patient Arrived: Ambulatory Added or deleted any medications: No Arrival Time: 12:43 Any new allergies or adverse reactions: No Accompanied By: self Had a fall or experienced change in No Transfer Assistance: None activities of daily living that may affect Patient Identification Verified: Yes risk of falls: Secondary Verification Process Completed: Yes Signs or symptoms of abuse/neglect since last visito No Patient Requires Transmission-Based No Hospitalized since last visit: No Precautions: Implantable device outside of the clinic excluding No Patient Has Alerts: Yes cellular tissue based products placed in the center Patient Alerts: Patient on Blood since last visit: Thinner Has Dressing in Place as Prescribed: Yes type ll diabetic Has Compression in Place as Prescribed: Yes Pain Present Now: No Electronic Signature(s) Signed: 05/09/2021 2:18:56 PM By: Carlene Coria RN Entered By: Carlene Coria on 05/09/2021 12:57:40 [name], [name] ([id number]) -------------------------------------------------------------------------------- Compression Therapy Details Patient Name: [name] [name]. Date of Service: 05/09/2021 12:45 PM Medical Record Number: [id number] Patient Account Number: [id number] Date of Birth/Sex: [birth year] (80 y.o. F) Treating RN: Cornell Barman Primary Care Layne Lebon: Sela Hilding Other Clinician: Referring Tykee Heideman: Sela Hilding Treating Taraji Mungo/Extender: Tito Dine in Treatment: 7 Compression Therapy Performed for Wound Assessment: Wound #5 Right,Anterior Lower Leg Performed By: Clinician Cornell Barman, RN Compression Type: Three Layer Pre Treatment ABI: 0.8 Post Procedure Diagnosis Same as Pre-procedure Electronic Signature(s) Signed: 05/10/2021 5:46:47 PM By: Gretta Cool, BSN, RN, CWS, Kim RN, BSN Entered By: Gretta Cool, BSN, RN, CWS, Kim on 05/09/2021 13:07:14 [name] [name] ([id number]) -------------------------------------------------------------------------------- Encounter Discharge Information Details Patient Name: [name] [name]. Date of Service: 05/09/2021 12:45 PM Medical Record Number: [id number] Patient Account Number: [id number] Date of Birth/Sex: 1941/05/10 (80 y.o. F) Treating RN: Primary Care Emerson Barretto: Sela Hilding Other Clinician: Referring Evon Lopezperez: Sela Hilding Treating Daja Shuping/Extender: Tito Dine in Treatment: 7 Encounter Discharge Information Items Discharge Condition: Stable Ambulatory Status: Ambulatory Discharge Destination: Home Transportation: Private Auto Accompanied By: self Schedule Follow-up Appointment: Yes Clinical Summary of Care: Electronic Signature(s) Signed: 05/12/2021 4:37:07 PM By: Jeanine Luz Entered By: Jeanine Luz on 05/09/2021 13:30:37 [name], [name] ([id number]) -------------------------------------------------------------------------------- Lower Extremity Assessment Details Patient Name: [name] [name]. Date of Service: 05/09/2021 12:45 PM Medical Record Number: [id number] Patient Account Number: [id number] Date of Birth/Sex: 01/12/1941 (80 y.o. F) Treating RN: Carlene Coria Primary Care Britaney Espaillat: Sela Hilding Other Clinician: Referring Marcelino Campos: Sela Hilding Treating Chelsie Burel/Extender: Tito Dine in Treatment: 7 Edema Assessment Assessed: [Left: No] [Right: No] Edema: [Left: Yes] [Right:  Yes] Calf Left: Right: Point of Measurement: 35 cm From Medial Instep 42 cm 43 cm Ankle Left: Right: Point of Measurement: 9 cm From Medial Instep 23.5 cm 26 cm Vascular Assessment Pulses: Dorsalis Pedis Palpable: [Left:Yes] [Right:Yes] Electronic Signature(s) Signed: 05/09/2021 2:18:56 PM By: Carlene Coria RN Signed: 05/10/2021 5:46:47 PM By: Gretta Cool, BSN, RN, CWS, Kim RN, BSN Entered By: Gretta Cool, BSN, RN, CWS, Kim on 05/09/2021 13:06:24 [name] [name] ([id number]) -------------------------------------------------------------------------------- Multi Wound Chart Details Patient Name: [name] [name]. Date of Service: 05/09/2021 12:45 PM Medical Record Number: [id number] Patient Account Number: [id number] Date of Birth/Sex:  01-Aug-1941 (80 y.o. 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 Comorbid History: Cataracts, Hypertension, Type II Cataracts, Hypertension, Type II N/A Diabetes, Rheumatoid Arthritis Diabetes, Rheumatoid Arthritis Date Acquired: 03/17/2021 05/05/2021 N/A Weeks of Treatment: 7 0 N/A Wound Status: Open Open N/A Measurements L x W x D (cm) 0x0x0 0.5x0.6x0.1 N/A Area (cm) : 0 0.236 N/A Volume (cm) : 0 0.024 N/A % Reduction in Area: 100.00% N/A N/A % Reduction in Volume: 100.00% N/A N/A Classification: Full Thickness Without Exposed Grade 1 N/A Support Structures Exudate Amount: None Present Small N/A Exudate Type: N/A  Serosanguineous N/A Exudate Color: N/A red, brown N/A Granulation Amount: Large (67-100%) None Present (0%) N/A Granulation Quality: Red N/A N/A Necrotic Amount: None Present (0%) None Present (0%) N/A Exposed Structures: Fascia: No Fascia: No N/A Fat Layer (Subcutaneous Tissue): Fat Layer (Subcutaneous Tissue): No No Tendon: No Tendon: No Muscle: No Muscle: No Joint: No Joint: No Bone: No Bone: No Epithelialization: Medium (34-66%) None N/A Procedures Performed: N/A Compression Therapy N/A Treatment Notes Electronic Signature(s) Signed: 05/09/2021 3:42:13 PM By: Linton Ham MD Entered By: Linton Ham on 05/09/2021 13:11:56 Kluver, [name] ([id number]) -------------------------------------------------------------------------------- [place] Details Patient Name: [name] [name]. Date of Service: 05/09/2021 12:45 PM Medical Record Number: [id number] Patient Account Number: [id number] Date of Birth/Sex: 20-Aug-1941 (80 y.o. F) Treating RN: Cornell Barman Primary Care Demontez Novack: Sela Hilding Other Clinician: Referring Kaiyan Luczak: Sela Hilding Treating Pollyann Roa/Extender: Tito Dine in Treatment: 7 Active Inactive Wound/Skin Impairment Nursing Diagnoses: Impaired tissue integrity Goals: Patient/caregiver will verbalize understanding of skin care regimen Date Initiated: 03/17/2021 Date Inactivated: 03/29/2021 Target Resolution Date: 03/17/2021 Goal Status: Met Ulcer/skin breakdown will have a volume reduction of 30% by week 4 Date Initiated: 03/17/2021 Date Inactivated: 04/05/2021 Target Resolution Date: 04/16/2021 Goal Status: Met Ulcer/skin breakdown will have a volume reduction of 50% by week 8 Date Initiated: 03/17/2021 Date Inactivated: 05/02/2021 Target Resolution Date: 05/16/2021 Goal Status: Met Ulcer/skin breakdown will have a volume reduction of 80% by week 12 Date Initiated: 03/17/2021 Target Resolution Date: 06/16/2021 Goal  Status: Active Ulcer/skin breakdown will heal within 14 weeks Date Initiated: 03/17/2021 Target Resolution Date: 07/16/2021 Goal Status: Active Interventions: Assess patient/caregiver ability to obtain necessary supplies Assess patient/caregiver ability to perform ulcer/skin care regimen upon admission and as needed Assess ulceration(s) every visit Provide education on ulcer and skin care Treatment Activities: Referred to DME Booker Bhatnagar for dressing supplies : 03/17/2021 Skin care regimen initiated : 03/17/2021 Notes: Electronic Signature(s) Signed: 05/10/2021 5:46:47 PM By: Gretta Cool, BSN, RN, CWS, Kim RN, BSN Entered By: Gretta Cool, BSN, RN, CWS, Kim on 05/09/2021 13:06:37 Bamberg, [name] ([id number]) -------------------------------------------------------------------------------- Pain Assessment Details Patient Name: [name] [name]. Date of Service: 05/09/2021 12:45 PM Medical Record Number: [id number] Patient Account Number: [id number] Date of Birth/Sex: 01-Sep-1941 (80 y.o. F) Treating RN: Carlene Coria Primary Care Shin Lamour: Sela Hilding Other Clinician: Referring Maeley Matton: Sela Hilding Treating Taima Rada/Extender: Tito Dine in Treatment: 7 Active Problems Location of Pain Severity and Description of Pain Patient Has Paino No Site Locations Pain Management and Medication Current Pain Management: Electronic Signature(s)  Signed: 05/09/2021 2:18:56 PM By: Carlene Coria RN Entered By: Carlene Coria on 05/09/2021 12:58:20 [name] [name] ([id number]) -------------------------------------------------------------------------------- Patient/Caregiver Education Details Patient Name: [name] [name]. Date of Service: 05/09/2021 12:45 PM Medical Record Number: [id number] Patient Account Number: [id number] Date of Birth/Gender: Aug 18, 1941 (80 y.o. F) Treating RN: Cornell Barman Primary Care Physician: Sela Hilding Other Clinician: Referring Physician: Sela Hilding Treating  Physician/Extender: Tito Dine in Treatment: 7 Education Assessment Education Provided To: Patient Education Topics Provided Venous: Handouts: Controlling Swelling with Compression Stockings Methods: Demonstration, Explain/Verbal Responses: State content correctly Wound/Skin Impairment: Handouts: Caring for Your Ulcer Methods: Demonstration, Explain/Verbal Responses: State content correctly Electronic Signature(s) Signed: 05/10/2021 5:46:47 PM By: Gretta Cool, BSN, RN, CWS, Kim RN, BSN Entered By: Gretta Cool, BSN, RN, CWS, Kim on 05/09/2021 13:11:34 [name] [name] ([id number]) -------------------------------------------------------------------------------- Wound Assessment Details Patient Name: [name] [name]. Date of Service: 05/09/2021 12:45 PM Medical Record Number: [id number] Patient Account Number: [id number] Date of Birth/Sex: 25-Jul-1941 (80 y.o. F) Treating RN: Carlene Coria Primary Care Laiken Nohr: Sela Hilding Other Clinician: Referring Naesha Buckalew: Sela Hilding Treating Manuella Blackson/Extender: Tito Dine in Treatment: 7 Wound Status Wound Number: 4 Primary Venous Leg Ulcer Etiology: Wound Location: Left, Lateral Lower Leg Wound Status: Open Wounding Event: Blister Comorbid Cataracts, Hypertension, Type II Diabetes, Date Acquired: 03/17/2021 History: Rheumatoid Arthritis Weeks Of Treatment: 7 Clustered Wound: No Photos Wound Measurements Length: (cm) Width: (cm) Depth: (cm) Area: (cm) Volume: (cm) 0 % Reduction in Area: 100% 0 % Reduction in Volume: 100% 0 Epithelialization: Medium (34-66%) 0 Tunneling: No 0 Undermining: No Wound Description Classification: Full Thickness Without Exposed Support Structures Exudate Amount: None Present Foul Odor After Cleansing: No Slough/Fibrino No Wound Bed Granulation Amount: Large (67-100%) Exposed Structure Granulation Quality: Red Fascia Exposed: No Necrotic Amount: None Present (0%) Fat Layer  (Subcutaneous Tissue) Exposed: No Tendon Exposed: No Muscle Exposed: No Joint Exposed: No Bone Exposed: No Electronic Signature(s) Signed: 05/09/2021 2:18:56 PM By: Carlene Coria RN Entered By: Carlene Coria on 05/09/2021 13:00:15 Digiacomo, [name] ([id number]) -------------------------------------------------------------------------------- Wound Assessment Details Patient Name: [name] [name]. Date of Service: 05/09/2021 12:45 PM Medical Record Number: [id number] Patient Account Number: [id number] Date of Birth/Sex: December 25, 1940 (80 y.o. F) Treating RN: Carlene Coria Primary Care Zackariah Vanderpol: Sela Hilding Other Clinician: Referring Jernie Schutt: Sela Hilding Treating Eily Louvier/Extender: Tito Dine in Treatment: 7 Wound Status Wound Number: 5 Primary Diabetic Wound/Ulcer of the Lower Extremity Etiology: Wound Location: Right, Anterior Lower Leg Wound Status: Open Wounding Event: Gradually Appeared Comorbid Cataracts, Hypertension, Type II Diabetes, Date Acquired: 05/05/2021 History: Rheumatoid Arthritis Weeks Of Treatment: 0 Clustered Wound: No Wound Measurements Length: (cm) 0.5 Width: (cm) 0.6 Depth: (cm) 0.1 Area: (cm) 0.236 Volume: (cm) 0.024 % Reduction in Area: % Reduction in Volume: Epithelialization: None Tunneling: No Undermining: No Wound Description Classification: Grade 1 Exudate Amount: Small Exudate Type: Serosanguineous Exudate Color: red, brown Foul Odor After Cleansing: No Slough/Fibrino No Wound Bed Granulation Amount: None Present (0%) Exposed Structure Necrotic Amount: None Present (0%) Fascia Exposed: No Fat Layer (Subcutaneous Tissue) Exposed: No Tendon Exposed: No Muscle Exposed: No Joint Exposed: No Bone Exposed: No Treatment Notes Wound #5 (Lower Leg) Wound Laterality: Right, Anterior Cleanser Peri-Wound Care Triamcinolone Acetonide Cream, 0.1%, 15 (g) tube Discharge Instruction: red areas on right leg Topical Primary  Dressing Silvercel Small 2x2 (in/in) Discharge Instruction: Apply Silvercel Small 2x2 (in/in) as instructed Secondary Dressing ABD Pad 5x9 (in/in) Discharge Instruction: Cover with ABD pad Secured With Compression Wrap Medichoice 4 layer Compression System,  35-40 mmHG Discharge Instruction: Apply multi-layer wrap as directed. LITSY, EPTING ([id number]) Compression Stockings Add-Ons Electronic Signature(s) Signed: 05/09/2021 2:18:56 PM By: Carlene Coria RN Entered By: Carlene Coria on 05/09/2021 13:01:37 Knopf, [name] ([id number]) -------------------------------------------------------------------------------- Vitals Details Patient Name: [name] [name]. Date of Service: 05/09/2021 12:45 PM Medical Record Number: [id number] Patient Account Number: [id number] Date of Birth/Sex: 01/26/1941 (80 y.o. F) Treating RN: Carlene Coria Primary Care Jaren Kearn: Sela Hilding Other Clinician: Referring Abbegayle Denault: Sela Hilding Treating Averi Cacioppo/Extender: Tito Dine in Treatment: 7 Vital Signs Time Taken: 12:57 Temperature (F): 97.6 Height (in): 66 Pulse (bpm): 56 Weight (lbs): 242 Respiratory Rate (breaths/min): 18 Body Mass Index (BMI): 39.1 Blood Pressure (mmHg): 155/79 Reference Range: 80 - 120 mg / dl Electronic Signature(s) Signed: 05/09/2021 2:18:56 PM By: Carlene Coria RN Entered By: Carlene Coria on 05/09/2021 12:58:04

## 2021-05-18 ENCOUNTER — Encounter: Payer: Medicare Other | Attending: Internal Medicine | Admitting: Internal Medicine

## 2021-05-18 ENCOUNTER — Other Ambulatory Visit: Payer: Self-pay

## 2021-05-18 DIAGNOSIS — L97822 Non-pressure chronic ulcer of other part of left lower leg with fat layer exposed: Secondary | ICD-10-CM | POA: Insufficient documentation

## 2021-05-18 DIAGNOSIS — I480 Paroxysmal atrial fibrillation: Secondary | ICD-10-CM | POA: Diagnosis not present

## 2021-05-18 DIAGNOSIS — L97812 Non-pressure chronic ulcer of other part of right lower leg with fat layer exposed: Secondary | ICD-10-CM | POA: Insufficient documentation

## 2021-05-18 DIAGNOSIS — I872 Venous insufficiency (chronic) (peripheral): Secondary | ICD-10-CM | POA: Diagnosis not present

## 2021-05-18 DIAGNOSIS — Z7901 Long term (current) use of anticoagulants: Secondary | ICD-10-CM | POA: Diagnosis not present

## 2021-05-18 DIAGNOSIS — E11622 Type 2 diabetes mellitus with other skin ulcer: Secondary | ICD-10-CM | POA: Diagnosis not present

## 2021-05-18 NOTE — Progress Notes (Signed)
SIDRA, OLDFIELD ([id number]) Visit Report for 05/18/2021 HPI Details Patient Name: [name] [name], [name] [name]. Date of Service: 05/18/2021 9:15 AM Medical Record Number: [id number] Patient Account Number: [id number] Date of Birth/Sex: 07-18-1941 (80 y.o. F) Treating RN: Cornell Barman Primary Care Provider: Sela Hilding Other Clinician: Referring Provider: Sela Hilding Treating Provider/Extender: Tito Dine in Treatment: 8 History of Present Illness HPI Description: 03/17/2021 upon evaluation today patient presents for initial inspection here in the clinic today concerning issues that she has been having with wounds over the bilateral lower extremities. Unfortunately these appear to be venous in nature and have come up fairly insidiously though they have been going on for several months. The patient does have a history of chronic venous insufficiency she has not been wearing any compression prior to this happening. With that being said she also has diabetes that she is not taking any current medications. She also has atrial fibrillation as well as being on long-term anticoagulant therapy. With that being said she is scheduled to have a cardioversion on Monday. Her cardiologist feels like that a lot of the swelling in her leg stems from the significant amount of Cardiac dysfunction is related to the atrial fibrillation. They are hoping that the cardioversion will help this if it is successful. They are try to hold off on proceeding with any type of ablation until her legs are healed it sounds like to me. 03/29/2021 upon evaluation today patient appears to be doing well in regard to the wounds on her bilateral lower extremities. She is actually making a significant amount of progress in such a short time and a very pleased in that regard. I do think that she needs to continue to monitor for any signs of worsening as far as infection is concerned. This would often be noted obviously by increased  pain or drainage right now or see none of that and overall I am extremely pleased with where things stand today. 04/05/2021 upon evaluation today patient appears to be doing well currently in regard to her wounds on the lower extremities. Fortunately there does not appear to be any signs of active infection at this time which is great news and in general and very pleased. I think the right leg is pretty much completely healed the left leg though not completely healed is doing significantly better which is great news. There does not appear to be any evidence of active infection at this time which is excellent as well. 04/18/2021 upon evaluation today patient appears to be doing well with regard to her leg ulcers. She has been tolerating the dressing changes without complication. Fortunately there does not appear to be any signs of active infection at this time. No fevers, chills, nausea, vomiting, or diarrhea. 04/25/2021 upon evaluation today patient appears to be doing excellent in regard to her wound. She has been tolerating the dressing changes without complication. Fortunately there is no evidence of active infection which is great news. No fevers, chills, nausea, vomiting, or diarrhea. The good news is the patient did get her juxta lite wraps which she has with her today. 05/02/2021 upon evaluation today patient appears to be doing well with regard to her wound on the left leg which is almost completely healed. Fortunately there does not appear to be any signs of active infection which is great news. No fevers, chills, nausea, vomiting, or diarrhea. Unfortunately she does have a small blister on the right leg this is not technically open but nonetheless she tells me  the juxta lite wrap just did not seem to do so well for her she did not bring it today but she tells me there is 1 area right where the blister is that she could never really seem to get to compress fully. 5/23; left leg wound is indeed  healed today. UNFORTUNATELY she has more swelling and an increase open area on the right anterior mid tibia. Surrounding erythema here but with no pain or tenderness. We had wrapped the right leg last week and a 3 layer compression. She has bilateral external compression stockings and waiting for the eventuality that the legs heal 6/1; area on the right leg on the anterior tibia is closed. Area on the left has her external compression stocking. Both legs are healed Electronic Signature(s) Signed: 05/18/2021 5:08:05 PM By: Linton Ham MD Entered By: Linton Ham on 05/18/2021 10:03:56 Cornell, [name] ([id number]) -------------------------------------------------------------------------------- Physical Exam Details Patient Name: [name] [name]. Date of Service: 05/18/2021 9:15 AM Medical Record Number: [id number] Patient Account Number: [id number] Date of Birth/Sex: Apr 28, 1941 (80 y.o. F) Treating RN: Cornell Barman Primary Care Provider: Sela Hilding Other Clinician: Referring Provider: Sela Hilding Treating Provider/Extender: Tito Dine in Treatment: 8 Constitutional Sitting or standing Blood Pressure is within target range for patient.. Pulse regular and within target range for patient.[name] Respirations regular, non- labored and within target range.. Temperature is normal and within the target range for the patient.[name] appears in no distress. Cardiovascular No pulses are palpable. Notes Wound exam; the area on the right anterior mid tibia is closed everything is epithelialized here. Her edema is well controlled. Electronic Signature(s) Signed: 05/18/2021 5:08:05 PM By: Linton Ham MD Entered By: Linton Ham on 05/18/2021 10:04:54 [name] [name] ([id number]) -------------------------------------------------------------------------------- Physician Orders Details Patient Name: [name] [name]. Date of Service: 05/18/2021 9:15 AM Medical Record Number: [id number] Patient  Account Number: [id number] Date of Birth/Sex: 29-Jun-1941 (80 y.o. F) Treating RN: Dolan Amen Primary Care Provider: Sela Hilding Other Clinician: Referring Provider: Sela Hilding Treating Provider/Extender: Tito Dine in Treatment: 8 Verbal / Phone Orders: No Diagnosis Coding Discharge From [facility] o Discharge from [place] [facility] o Wear compression garments daily. Put garments on first thing when you wake up and remove them before bed. o Moisturize legs daily after removing compression garments. o Elevate, Exercise Daily and Avoid Standing for Long Periods of Time. Electronic Signature(s) Signed: 05/18/2021 4:52:16 PM By: Georges Mouse, Minus Breeding RN Signed: 05/18/2021 5:08:05 PM By: Linton Ham MD Entered By: Georges Mouse, Minus Breeding on 05/18/2021 09:55:16 Kawamoto, [name] ([id number]) -------------------------------------------------------------------------------- Problem List Details Patient Name: [name] [name]. Date of Service: 05/18/2021 9:15 AM Medical Record Number: [id number] Patient Account Number: [id number] Date of Birth/Sex: Oct 28, 1941 (80 y.o. F) Treating RN: Cornell Barman Primary Care Provider: Sela Hilding Other Clinician: Referring Provider: Sela Hilding Treating Provider/Extender: Tito Dine in Treatment: 8 Active Problems ICD-10 Encounter Code Description Active Date MDM Diagnosis I87.2 Venous insufficiency (chronic) (peripheral) 03/17/2021 No Yes L97.822 Non-pressure chronic ulcer of other part of left lower leg with fat layer 03/17/2021 No Yes exposed L97.812 Non-pressure chronic ulcer of other part of right lower leg with fat layer 03/17/2021 No Yes exposed E11.622 Type 2 diabetes mellitus with other skin ulcer 03/17/2021 No Yes I48.0 Paroxysmal atrial fibrillation 03/17/2021 No Yes Z79.01 Long term (current) use of anticoagulants 03/17/2021 No Yes Inactive Problems Resolved  Problems Electronic Signature(s) Signed: 05/18/2021 5:08:05 PM By: Linton Ham MD Entered By: Linton Ham on 05/18/2021 10:01:51  ORLENE, SALMONS ([id number]) -------------------------------------------------------------------------------- Progress Note Details Patient Name: CRUZITA, LIPA. Date of Service: 05/18/2021 9:15 AM Medical Record Number: [id number] Patient Account Number: [id number] Date of Birth/Sex: 18-Oct-1941 (80 y.o. F) Treating RN: Cornell Barman Primary Care Provider: Sela Hilding Other Clinician: Referring Provider: Sela Hilding Treating Provider/Extender: Tito Dine in Treatment: 8 Subjective History of Present Illness (HPI) 03/17/2021 upon evaluation today patient presents for initial inspection here in the clinic today concerning issues that she has been having with wounds over the bilateral lower extremities. Unfortunately these appear to be venous in nature and have come up fairly insidiously though they have been going on for several months. The patient does have a history of chronic venous insufficiency she has not been wearing any compression prior to this happening. With that being said she also has diabetes that she is not taking any current medications. She also has atrial fibrillation as well as being on long-term anticoagulant therapy. With that being said she is scheduled to have a cardioversion on Monday. Her cardiologist feels like that a lot of the swelling in her leg stems from the significant amount of Cardiac dysfunction is related to the atrial fibrillation. They are hoping that the cardioversion will help this if it is successful. They are try to hold off on proceeding with any type of ablation until her legs are healed it sounds like to me. 03/29/2021 upon evaluation today patient appears to be doing well in regard to the wounds on her bilateral lower extremities. She is actually making a significant amount of progress in such a  short time and a very pleased in that regard. I do think that she needs to continue to monitor for any signs of worsening as far as infection is concerned. This would often be noted obviously by increased pain or drainage right now or see none of that and overall I am extremely pleased with where things stand today. 04/05/2021 upon evaluation today patient appears to be doing well currently in regard to her wounds on the lower extremities. Fortunately there does not appear to be any signs of active infection at this time which is great news and in general and very pleased. I think the right leg is pretty much completely healed the left leg though not completely healed is doing significantly better which is great news. There does not appear to be any evidence of active infection at this time which is excellent as well. 04/18/2021 upon evaluation today patient appears to be doing well with regard to her leg ulcers. She has been tolerating the dressing changes without complication. Fortunately there does not appear to be any signs of active infection at this time. No fevers, chills, nausea, vomiting, or diarrhea. 04/25/2021 upon evaluation today patient appears to be doing excellent in regard to her wound. She has been tolerating the dressing changes without complication. Fortunately there is no evidence of active infection which is great news. No fevers, chills, nausea, vomiting, or diarrhea. The good news is the patient did get her juxta lite wraps which she has with her today. 05/02/2021 upon evaluation today patient appears to be doing well with regard to her wound on the left leg which is almost completely healed. Fortunately there does not appear to be any signs of active infection which is great news. No fevers, chills, nausea, vomiting, or diarrhea. Unfortunately she does have a small blister on the right leg this is not technically open but nonetheless she tells me the juxta  lite wrap just did not  seem to do so well for her she did not bring it today but she tells me there is 1 area right where the blister is that she could never really seem to get to compress fully. 5/23; left leg wound is indeed healed today. UNFORTUNATELY she has more swelling and an increase open area on the right anterior mid tibia. Surrounding erythema here but with no pain or tenderness. We had wrapped the right leg last week and a 3 layer compression. She has bilateral external compression stockings and waiting for the eventuality that the legs heal 6/1; area on the right leg on the anterior tibia is closed. Area on the left has her external compression stocking. Both legs are healed Objective Constitutional Sitting or standing Blood Pressure is within target range for patient.. Pulse regular and within target range for patient.[name] Respirations regular, non- labored and within target range.. Temperature is normal and within the target range for the patient.[name] appears in no distress. Vitals Time Taken: 9:30 AM, Height: 66 in, Weight: 242 lbs, BMI: 39.1, Temperature: 97.9 F, Pulse: 50 bpm, Respiratory Rate: 16 breaths/min, Blood Pressure: 135/69 mmHg. Cardiovascular No pulses are palpable. General Notes: Wound exam; the area on the right anterior mid tibia is closed everything is epithelialized here. Her edema is well controlled. Integumentary (Hair, Skin) Wound #5 status is Healed - Epithelialized. Original cause of wound was Gradually Appeared. The date acquired was: 05/05/2021. The wound has Haik, Volanda K. ([id number]) been in treatment 1 weeks. The wound is located on the Right,Anterior Lower Leg. The wound measures 0cm length x 0cm width x 0cm depth; 0cm^2 area and 0cm^3 volume. There is no tunneling or undermining noted. There is a none present amount of drainage noted. There is no granulation within the wound bed. There is no necrotic tissue within the wound bed. 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 Put garments on first thing when you wake up and remove them before bed. Moisturize legs daily after removing compression garments. Elevate, Exercise Daily and Avoid Standing for Long Periods of Time. 1. The patient can be discharged from the clinic 2. We went over the need to wear her external compression stockings, skin lubrication her bilateral lower legs at night with a moisturizer, activity, leg elevation when she is sitting for prolonged periods etc. Electronic Signature(s) Signed: 05/18/2021 5:08:05 PM By: Linton Ham MD Entered By: Linton Ham on 05/18/2021 10:05:43 Manger, [name] ([id number]) -------------------------------------------------------------------------------- SuperBill Details Patient Name: [name] [name]. Date of Service: 05/18/2021 Medical Record Number: [id number] Patient Account Number: [id number] Date of Birth/Sex: 1941-01-13 (80 y.o. F) Treating RN: Cornell Barman Primary Care Provider: Sela Hilding Other Clinician: Referring Provider: Sela Hilding Treating Provider/Extender: Tito Dine in Treatment: 8 Diagnosis Coding ICD-10 Codes Code Description I87.2 Venous insufficiency (chronic) (peripheral) L97.822 Non-pressure chronic ulcer of other part of left lower leg with fat layer exposed L97.812 Non-pressure chronic ulcer of other part of right lower leg with fat layer exposed E11.622 Type 2 diabetes mellitus with other skin ulcer I48.0 Paroxysmal atrial fibrillation Z79.01 Long term (current) use of  anticoagulants Facility  Procedures CPT4 Code: [id number] Description: [phone] - WOUND CARE VISIT-LEV 2 EST PT Modifier: Quantity: 1 Physician Procedures CPT4 Code: [id number] Description: 91444 - WC PHYS LEVEL 2 - EST PT Modifier: Quantity: 1 CPT4 Code: Description: ICD-10 Diagnosis Description I87.2 Venous insufficiency (chronic) (peripheral) L97.822 Non-pressure chronic ulcer of other part of left lower leg with fat lay L97.812 Non-pressure chronic ulcer of other part of right lower leg with fat la Modifier: er exposed yer exposed Quantity: Electronic Signature(s) Signed: 05/18/2021 5:08:05 PM By: Linton Ham MD Entered By: Linton Ham on 05/18/2021 10:06:02

## 2021-05-19 NOTE — Progress Notes (Signed)
[name] [name], [name] [name] ([id number]) Visit Report for 05/18/2021 Arrival Information Details Patient Name: [name] [name], [name] [name]. Date of Service: 05/18/2021 9:15 AM Medical Record Number: [id number] Patient Account Number: [id number] Date of Birth/Sex: [birth year] (80 y.o. F) Treating RN: Donnamarie Poag Primary Care Josephyne Tarter: Sela Hilding Other Clinician: Referring Creed Kail: Sela Hilding Treating Marshell Dilauro/Extender: Tito Dine in Treatment: 8 Visit Information History Since Last Visit Added or deleted any medications: No Patient Arrived: Ambulatory Had a fall or experienced change in No Arrival Time: 09:27 activities of daily living that may affect Accompanied By: husband risk of falls: Transfer Assistance: None Hospitalized since last visit: No Patient Identification Verified: Yes Has Dressing in Place as Prescribed: Yes Secondary Verification Process Completed: Yes Has Compression in Place as Prescribed: Yes Patient Requires Transmission-Based No Pain Present Now: No Precautions: Patient Has Alerts: Yes Patient Alerts: Patient on Blood Thinner type ll diabetic Electronic Signature(s) Signed: 05/19/2021 9:13:32 AM By: Donnamarie Poag Entered By: Donnamarie Poag on 05/18/2021 09:28:04 [name] [name] ([id number]) -------------------------------------------------------------------------------- Clinic Level of Care Assessment Details Patient Name: [name] [name]. Date of Service: 05/18/2021 9:15 AM Medical Record Number: [id number] Patient Account Number: [id number] Date of Birth/Sex: July 20, 1941 (80 y.o. F) Treating RN: Dolan Amen Primary Care Jinelle Butchko: Sela Hilding Other Clinician: Referring Rajeev Escue: Sela Hilding Treating Relena Ivancic/Extender: Tito Dine in Treatment: 8 Clinic Level of Care Assessment Items TOOL 4 Quantity Score X - Use when only an EandM is performed on FOLLOW-UP visit 1 0 ASSESSMENTS - Nursing Assessment / Reassessment X - Reassessment of  Co-morbidities (includes updates in patient status) 1 10 X- 1 5 Reassessment of Adherence to Treatment Plan ASSESSMENTS - Wound and Skin Assessment / Reassessment []  - Simple Wound Assessment / Reassessment - one wound 0 []  - 0 Complex Wound Assessment / Reassessment - multiple wounds []  - 0 Dermatologic / Skin Assessment (not related to wound area) ASSESSMENTS - Focused Assessment []  - Circumferential Edema Measurements - multi extremities 0 []  - 0 Nutritional Assessment / Counseling / Intervention []  - 0 Lower Extremity Assessment (monofilament, tuning fork, pulses) []  - 0 Peripheral Arterial Disease Assessment (using hand held doppler) ASSESSMENTS - Ostomy and/or Continence Assessment and Care []  - Incontinence Assessment and Management 0 []  - 0 Ostomy Care Assessment and Management (repouching, etc.) PROCESS - Coordination of Care X - Simple Patient / Family Education for ongoing care 1 15 []  - 0 Complex (extensive) Patient / Family Education for ongoing care []  - 0 Staff obtains Programmer, systems, Records, Test Results / Process Orders []  - 0 Staff telephones HHA, Nursing Homes / Clarify orders / etc []  - 0 Routine Transfer to another Facility (non-emergent condition) []  - 0 Routine Hospital Admission (non-emergent condition) []  - 0 New Admissions / Biomedical engineer / Ordering NPWT, Apligraf, etc. []  - 0 Emergency Hospital Admission (emergent condition) X- 1 10 Simple Discharge Coordination []  - 0 Complex (extensive) Discharge Coordination PROCESS - Special Needs []  - Pediatric / Minor Patient Management 0 []  - 0 Isolation Patient Management []  - 0 Hearing / Language / Visual special needs []  - 0 Assessment of Community assistance (transportation, D/C planning, etc.) []  - 0 Additional assistance / Altered mentation []  - 0 Support Surface(s) Assessment (bed, cushion, seat, etc.) INTERVENTIONS - Wound Cleansing / Measurement [name] [name], [name] K. ([id number]) []  -  0 Simple Wound Cleansing - one wound []  - 0 Complex Wound Cleansing - multiple wounds []  - 0 Wound Imaging (photographs - any number of wounds) []  - 0 Wound Tracing (  instead of photographs) []  - 0 Simple Wound Measurement - one wound []  - 0 Complex Wound Measurement - multiple wounds INTERVENTIONS - Wound Dressings []  - Small Wound Dressing one or multiple wounds 0 []  - 0 Medium Wound Dressing one or multiple wounds []  - 0 Large Wound Dressing one or multiple wounds []  - 0 Application of Medications - topical []  - 0 Application of Medications - injection INTERVENTIONS - Miscellaneous []  - External ear exam 0 []  - 0 Specimen Collection (cultures, biopsies, blood, body fluids, etc.) []  - 0 Specimen(s) / Culture(s) sent or taken to Lab for analysis []  - 0 Patient Transfer (multiple staff / Civil Service fast streamer / Similar devices) []  - 0 Simple Staple / Suture removal (25 or less) []  - 0 Complex Staple / Suture removal (26 or more) []  - 0 Hypo / Hyperglycemic Management (close monitor of Blood Glucose) []  - 0 Ankle / Brachial Index (ABI) - do not check if billed separately X- 1 5 Vital Signs Has the patient been seen at the hospital within the last three years: Yes Total Score: 45 Level Of Care: New/Established - Level 2 Electronic Signature(s) Signed: 05/18/2021 4:52:16 PM By: Georges Mouse, Minus Breeding RN Entered By: Georges Mouse, Kenia on 05/18/2021 09:55:46 [name] [name], [name] [name] ([id number]) -------------------------------------------------------------------------------- Encounter Discharge Information Details Patient Name: [name] [name]. Date of Service: 05/18/2021 9:15 AM Medical Record Number: [id number] Patient Account Number: [id number] Date of Birth/Sex: 1941/08/04 (80 y.o. F) Treating RN: Donnamarie Poag Primary Care Abbagail Scaff: Sela Hilding Other Clinician: Referring Kristofer Schaffert: Sela Hilding Treating Makynna Manocchio/Extender: Tito Dine in Treatment: 8 Encounter  Discharge Information Items Discharge Condition: Stable Ambulatory Status: Ambulatory Discharge Destination: Home Transportation: Private Auto Accompanied By: husband Schedule Follow-up Appointment: No Clinical Summary of Care: Electronic Signature(s) Signed: 05/18/2021 10:03:19 AM By: Jeanine Luz Entered By: Jeanine Luz on 05/18/2021 10:03:19 [name] [name] ([id number]) -------------------------------------------------------------------------------- Lower Extremity Assessment Details Patient Name: [name] [name]. Date of Service: 05/18/2021 9:15 AM Medical Record Number: [id number] Patient Account Number: [id number] Date of Birth/Sex: 18-May-1941 (80 y.o. F) Treating RN: Donnamarie Poag Primary Care Brendy Ficek: Sela Hilding Other Clinician: Referring Kanan Sobek: Sela Hilding Treating Lynnie Koehler/Extender: Tito Dine in Treatment: 8 Edema Assessment Assessed: [Left: No] [Right: Yes] [Left: Edema] [Right: :] Calf Left: Right: Point of Measurement: 35 cm From Medial Instep 44 cm Ankle Left: Right: Point of Measurement: 9 cm From Medial Instep 23.5 cm Vascular Assessment Pulses: Dorsalis Pedis Palpable: [Right:Yes] Electronic Signature(s) Signed: 05/19/2021 9:13:32 AM By: Donnamarie Poag Entered By: Donnamarie Poag on 05/18/2021 09:35:05 [name] [name], [name] [name] ([id number]) -------------------------------------------------------------------------------- Multi Wound Chart Details Patient Name: [name] [name]. Date of Service: 05/18/2021 9:15 AM Medical Record Number: [id number] Patient Account Number: [id number] Date of Birth/Sex: 29-Dec-1940 (80 y.o. F) Treating RN: Dolan Amen Primary Care Siaosi Alter: Sela Hilding Other Clinician: Referring Tyree Fluharty: Sela Hilding Treating Orphia Mctigue/Extender: Tito Dine in Treatment: 8 Vital Signs Height(in): 66 Pulse(bpm): 50 Weight(lbs): 242 Blood Pressure(mmHg): 135/69 Body Mass Index(BMI): 39 Temperature(F):  97.9 Respiratory Rate(breaths/min): 16 Photos: [N/A:N/A] Wound Location: Right, Anterior Lower Leg N/A N/A Wounding Event: Gradually Appeared N/A N/A Primary Etiology: Diabetic Wound/Ulcer of the Lower N/A N/A Extremity Comorbid History: Cataracts, Hypertension, Type II N/A N/A Diabetes, Rheumatoid Arthritis Date Acquired: 05/05/2021 N/A N/A Weeks of Treatment: 1 N/A N/A Wound Status: Healed - Epithelialized N/A N/A Measurements L x W x D (cm) 0x0x0 N/A N/A Area (cm) : 0 N/A N/A Volume (cm) : 0 N/A N/A % Reduction in Area: 100.00% N/A N/A %  Reduction in Volume: 100.00% N/A N/A Classification: Grade 1 N/A N/A Exudate Amount: None Present N/A N/A Granulation Amount: None Present (0%) N/A N/A Necrotic Amount: None Present (0%) N/A N/A Exposed Structures: Fascia: No N/A N/A Fat Layer (Subcutaneous Tissue): No Tendon: No Muscle: No Joint: No Bone: No Epithelialization: Large (67-100%) N/A N/A Treatment Notes Electronic Signature(s) Signed: 05/18/2021 5:08:05 PM By: Linton Ham MD Entered By: Linton Ham on 05/18/2021 10:02:01 [name] [name] ([id number]) -------------------------------------------------------------------------------- [place] Details Patient Name: [name] [name]. Date of Service: 05/18/2021 9:15 AM Medical Record Number: [id number] Patient Account Number: [id number] Date of Birth/Sex: May 11, 1941 (80 y.o. F) Treating RN: Dolan Amen Primary Care Natesha Hassey: Sela Hilding Other Clinician: Referring Mikita Lesmeister: Sela Hilding Treating Jubal Rademaker/Extender: Tito Dine in Treatment: 8 Active Inactive Electronic Signature(s) Signed: 05/18/2021 4:52:16 PM By: Georges Mouse, Minus Breeding RN Entered By: Georges Mouse, Minus Breeding on 05/18/2021 09:54:10 [name] [name], [name] [name] ([id number]) -------------------------------------------------------------------------------- Pain Assessment Details Patient Name: [name] [name]. Date of Service: 05/18/2021  9:15 AM Medical Record Number: [id number] Patient Account Number: [id number] Date of Birth/Sex: 03/25/1941 (80 y.o. F) Treating RN: Donnamarie Poag Primary Care Metztli Sachdev: Sela Hilding Other Clinician: Referring Janeshia Ciliberto: Sela Hilding Treating Chesney Suares/Extender: Tito Dine in Treatment: 8 Active Problems Location of Pain Severity and Description of Pain Patient Has Paino No Site Locations Rate the pain. Current Pain Level: 0 Pain Management and Medication Current Pain Management: Electronic Signature(s) Signed: 05/19/2021 9:13:32 AM By: Donnamarie Poag Entered By: Donnamarie Poag on 05/18/2021 09:29:26 [name] [name] ([id number]) -------------------------------------------------------------------------------- Patient/Caregiver Education Details Patient Name: [name] [name]. Date of Service: 05/18/2021 9:15 AM Medical Record Number: [id number] Patient Account Number: [id number] Date of Birth/Gender: February 21, 1941 (80 y.o. F) Treating RN: Dolan Amen Primary Care Physician: Sela Hilding Other Clinician: Referring Physician: Sela Hilding Treating Physician/Extender: Tito Dine in Treatment: 8 Education Assessment Education Provided To: Patient Education Topics Provided Notes discharge instructions Electronic Signature(s) Signed: 05/18/2021 4:52:16 PM By: Georges Mouse, Minus Breeding RN Entered By: Georges Mouse, Minus Breeding on 05/18/2021 09:56:07 [name] [name] ([id number]) -------------------------------------------------------------------------------- Wound Assessment Details Patient Name: [name] [name]. Date of Service: 05/18/2021 9:15 AM Medical Record Number: [id number] Patient Account Number: [id number] Date of Birth/Sex: 1941-03-25 (80 y.o. F) Treating RN: Donnamarie Poag Primary Care Weldon Nouri: Sela Hilding Other Clinician: Referring Starletta Houchin: Sela Hilding Treating Ivis Henneman/Extender: Tito Dine in Treatment: 8 Wound  Status Wound Number: 5 Primary Diabetic Wound/Ulcer of the Lower Extremity Etiology: Wound Location: Right, Anterior Lower Leg Wound Status: Healed - Epithelialized Wounding Event: Gradually Appeared Comorbid Cataracts, Hypertension, Type II Diabetes, Date Acquired: 05/05/2021 History: Rheumatoid Arthritis Weeks Of Treatment: 1 Clustered Wound: No Photos Wound Measurements Length: (cm) 0 Width: (cm) 0 Depth: (cm) 0 Area: (cm) 0 Volume: (cm) 0 % Reduction in Area: 100% % Reduction in Volume: 100% Epithelialization: Large (67-100%) Tunneling: No Undermining: No Wound Description Classification: Grade 1 Exudate Amount: None Present Foul Odor After Cleansing: No Slough/Fibrino No Wound Bed Granulation Amount: None Present (0%) Exposed Structure Necrotic Amount: None Present (0%) Fascia Exposed: No Fat Layer (Subcutaneous Tissue) Exposed: No Tendon Exposed: No Muscle Exposed: No Joint Exposed: No Bone Exposed: No Electronic Signature(s) Signed: 05/18/2021 4:52:16 PM By: Georges Mouse, Minus Breeding RN Signed: 05/19/2021 9:13:32 AM By: Donnamarie Poag Entered By: Georges Mouse, Minus Breeding on 05/18/2021 09:54:44 [name] [name], [name] [name] ([id number]) -------------------------------------------------------------------------------- Vitals Details Patient Name: [name] [name]. Date of Service: 05/18/2021 9:15 AM Medical Record Number: [id number] Patient Account Number: [id number] Date of Birth/Sex: 1941-03-01 (80 y.o. F) Treating  RN: Donnamarie Poag Primary Care Berthe Oley: Sela Hilding Other Clinician: Referring Alora Gorey: Sela Hilding Treating Millie Forde/Extender: Tito Dine in Treatment: 8 Vital Signs Time Taken: 09:30 Temperature (F): 97.9 Height (in): 66 Pulse (bpm): 50 Weight (lbs): 242 Respiratory Rate (breaths/min): 16 Body Mass Index (BMI): 39.1 Blood Pressure (mmHg): 135/69 Reference Range: 80 - 120 mg / dl Electronic Signature(s) Signed: 05/19/2021 9:13:32 AM By:  Donnamarie Poag Entered ByDonnamarie Poag on 05/18/2021 90:90:30

## 2021-05-23 ENCOUNTER — Ambulatory Visit: Payer: Medicare Other | Admitting: Physician Assistant

## 2021-05-30 ENCOUNTER — Ambulatory Visit: Payer: Medicare Other | Admitting: Physician Assistant

## 2021-05-30 ENCOUNTER — Other Ambulatory Visit (HOSPITAL_COMMUNITY): Payer: Self-pay | Admitting: Nurse Practitioner

## 2021-05-30 MED ORDER — METOPROLOL TARTRATE 25 MG PO TABS: 37.5000 mg | ORAL_TABLET | Freq: Two times a day (BID) | ORAL | 3 refills | Status: DC

## 2021-06-01 ENCOUNTER — Other Ambulatory Visit (HOSPITAL_COMMUNITY): Payer: Self-pay | Admitting: Nurse Practitioner

## 2021-06-16 ENCOUNTER — Other Ambulatory Visit: Payer: Self-pay | Admitting: Interventional Radiology

## 2021-06-16 ENCOUNTER — Other Ambulatory Visit: Payer: Self-pay

## 2021-06-16 DIAGNOSIS — N2889 Other specified disorders of kidney and ureter: Secondary | ICD-10-CM

## 2021-06-18 ENCOUNTER — Other Ambulatory Visit (HOSPITAL_COMMUNITY): Payer: Self-pay | Admitting: Physician Assistant

## 2021-06-25 ENCOUNTER — Other Ambulatory Visit (HOSPITAL_COMMUNITY): Payer: Self-pay | Admitting: Nurse Practitioner

## 2021-07-01 ENCOUNTER — Ambulatory Visit (HOSPITAL_COMMUNITY)
Admission: RE | Admit: 2021-07-01 | Discharge: 2021-07-01 | Disposition: A | Discharge status: Home / Self Care | Payer: Medicare Other | Source: Ambulatory Visit | Attending: Interventional Radiology | Admitting: Interventional Radiology

## 2021-07-01 ENCOUNTER — Other Ambulatory Visit: Payer: Self-pay

## 2021-07-01 DIAGNOSIS — K7689 Other specified diseases of liver: Secondary | ICD-10-CM | POA: Diagnosis not present

## 2021-07-01 DIAGNOSIS — N2889 Other specified disorders of kidney and ureter: Secondary | ICD-10-CM | POA: Insufficient documentation

## 2021-07-01 DIAGNOSIS — I700 Atherosclerosis of aorta: Secondary | ICD-10-CM | POA: Diagnosis not present

## 2021-07-01 DIAGNOSIS — N200 Calculus of kidney: Secondary | ICD-10-CM | POA: Diagnosis not present

## 2021-07-01 DIAGNOSIS — Z87448 Personal history of other diseases of urinary system: Secondary | ICD-10-CM | POA: Diagnosis not present

## 2021-07-01 LAB — POCT I-STAT CREATININE: Creatinine, Ser: 1.1 mg/dL — ABNORMAL HIGH (ref 0.44–1.00)

## 2021-07-01 MED ORDER — IOHEXOL 350 MG/ML SOLN
100.0000 mL | Freq: Once | INTRAVENOUS | Status: AC | PRN
  Administered 2021-07-01: 100 mL via INTRAVENOUS

## 2021-07-01 MED ORDER — SODIUM CHLORIDE (PF) 0.9 % IJ SOLN
INTRAMUSCULAR | Status: AC
  Filled 2021-07-01: qty 50

## 2021-07-06 ENCOUNTER — Other Ambulatory Visit: Payer: Self-pay

## 2021-07-06 ENCOUNTER — Encounter: Payer: Self-pay | Admitting: *Deleted

## 2021-07-06 ENCOUNTER — Ambulatory Visit
Admission: RE | Admit: 2021-07-06 | Discharge: 2021-07-06 | Disposition: A | Discharge status: Home / Self Care | Payer: Medicare Other | Source: Ambulatory Visit | Attending: Interventional Radiology | Admitting: Interventional Radiology

## 2021-07-06 DIAGNOSIS — N2889 Other specified disorders of kidney and ureter: Secondary | ICD-10-CM

## 2021-07-06 DIAGNOSIS — Z9889 Other specified postprocedural states: Secondary | ICD-10-CM | POA: Diagnosis not present

## 2021-07-06 HISTORY — PX: IR RADIOLOGIST EVAL & MGMT: IMG5224

## 2021-07-06 NOTE — Progress Notes (Signed)
Referring Physician(s): [name] Lefevre, MD  Chief Complaint: The patient is seen in follow up today s/p renal mass ablation  History of present illness:  [name] [name] is a 80 y.o. female presenting as a scheduled follow up to [place] [facility] [facility] today, status post treatment of left renal tumor with biopsy.   She joins [place] today virtually, and we confirmed her identity with 2 personal identifiers.   She was treated on 11/17/2020, with image guided biopsy and microwave ablation of left renal tumor.  This path was:    She presents today for 6 month follow up. She continues to recover well.  No back or flank pain, no hematuria, no dysuria, no fevers/chills.  Still has some shortness of breath, contributes to recovering from atrial fibrillation and cardioversion she had several months ago.   CT on 03/18/21 shows excellent treatment response without residual enhancement.  On delayed phases, there is a linear track within the ablative cavity from the probe which is in communication with the collecting system, all intra-capsular.    Recent repeat CT demonstrates resolution of this linear track.  No complicating features.  Past Medical History:  Diagnosis Date   Anemia    hx of   Arthritis    knee and shoulders   Atrial fibrillation (HCC)    Carpal tunnel syndrome on right    Coronary artery disease 05/2020   Minimal, nobstructive CAD. Tortuous cororany arteries.   Diabetes mellitus without complication (HCC)    diet controlled no meds in 5-6 yrs   Dyspnea    on exertion    Dysrhythmia 05/2020   afib   GERD (gastroesophageal reflux disease)    No longer taking medications   Hematuria    History of hypothyroidism 30 yrs ago   History of kidney stones    Hypertension    Lower extremity edema    chronic lower extremity edema    Myocardial infarction [facility])    patient mention questionable MI   Renal mass     Past Surgical History:  Procedure Laterality Date   ABDOMINAL HYSTERECTOMY   1992   complete   BILATERAL CARPAL TUNNEL RELEASE  yrs ago   CARDIOVERSION N/A 09/23/2020   Procedure: CARDIOVERSION;  Surgeon: Freada Bergeron, MD;  Location: [place];  Service: Cardiovascular;  Laterality: N/A;   CARDIOVERSION N/A 03/21/2021   Procedure: CARDIOVERSION;  Surgeon: Werner Lean, MD;  Location: [place];  Service: Cardiovascular;  Laterality: N/A;   CHOLECYSTECTOMY  1996   CYSTOSCOPY/URETEROSCOPY/HOLMIUM LASER/STENT PLACEMENT Bilateral 08/19/2020   Procedure: CYSTOSCOPY BILATERAL  RETROGRADE  URETEROSCOPY/HOLMIUM LASER/STENT PLACEMENT;  Surgeon: Irine Seal, MD;  Location: [place];  Service: Urology;  Laterality: Bilateral;   HAMMER TOE SURGERY Right yrs ago   IR RADIOLOGIST EVAL & MGMT  10/26/2020   IR RADIOLOGIST EVAL & MGMT  01/06/2021   IR RADIOLOGIST EVAL & MGMT  03/22/2021   LEFT HEART CATH AND CORONARY ANGIOGRAPHY N/A 06/02/2020   Procedure: LEFT HEART CATH AND CORONARY ANGIOGRAPHY;  Surgeon: Jettie Booze, MD;  Location: [place] [facility];  Service: Cardiovascular;  Laterality: N/A;   RADIOLOGY WITH ANESTHESIA N/A 11/17/2020   Procedure: RADIOLOGY WITH ANESTHESIA RENAL MICROWAVE ABLATION;  Surgeon: Suzette Battiest, MD;  Location: [facility];  Service: Radiology;  Laterality: N/A;   TONSILLECTOMY  age 12    Allergies: Tetanus-diphtheria toxoids td and Cardizem [diltiazem]  Medications: Prior to Admission medications   Medication Sig Start Date End Date Taking? Authorizing  Provider  acetaminophen (TYLENOL) 500 MG tablet Take 1,000 mg by mouth every 6 (six) hours as needed for moderate pain or headache.    [provider]  amiodarone (PACERONE) 200 MG tablet Take 1 tablet (200 mg total) by mouth daily. 03/10/21   Sherran Needs, NP  Calcium Carb-Cholecalciferol (CALCIUM 600+D) 600-800 MG-UNIT TABS Take 1 tablet by mouth every evening.     [provider]  ELIQUIS 5 MG TABS tablet TAKE 1 TABLET BY MOUTH TWICE A  DAY 06/01/21   Sherran Needs, NP  Hydrocortisone-Aloe Vera (DVVOHYWVP-71/GGYI EX) Apply 1 application topically 2 (two) times daily as needed (itching).    [provider]  Liniments (BLUE-EMU SUPER STRENGTH) CREA Apply 1 application topically 2 (two) times daily as needed (pain.).     [provider]  loperamide (IMODIUM A-D) 2 MG tablet Take 2-4 mg by mouth as needed for diarrhea or loose stools.     [provider]  metoprolol tartrate (LOPRESSOR) 25 MG tablet Take 1.5 tablets (37.5 mg total) by mouth 2 (two) times daily. 05/30/21   Vickie Epley, MD  Multiple Vitamin (MULTIVITAMIN WITH MINERALS) TABS tablet Take 1 tablet by mouth daily.    [provider]  Multiple Vitamins-Minerals (PRESERVISION AREDS 2 PO) Take 1 capsule by mouth 2 (two) times daily.     [provider]  Potassium Chloride ER 20 MEQ TBCR TAKE 2 TABLETS BY MOUTH DAILY 06/21/21   Sherran Needs, NP  torsemide (DEMADEX) 20 MG tablet Take 1 tablet (20 mg total) by mouth 2 (two) times daily. 03/10/21   Sherran Needs, NP     Family History  Problem Relation Age of Onset   Hypertension Mother    Hypertension Father    Diabetes Paternal Grandmother     Social History   Socioeconomic History   Marital status: Married    Spouse name: Not on file   Number of children: 5   Years of education: Not on file   Highest education level: Not on file  Occupational History   Occupation: Retired-pastor  Tobacco Use   Smoking status: Never   Smokeless tobacco: Never  Vaping Use   Vaping Use: Never used  Substance and Sexual Activity   Alcohol use: Not Currently   Drug use: Never   Sexual activity: Not Currently    Birth control/protection: Surgical  Other Topics Concern   Not on file  Social History Narrative   Not on file   Social Determinants of Health   Financial Resource Strain: Not on file  Food Insecurity: Not on file  Transportation Needs: Not on file   Physical Activity: Not on file  Stress: Not on file  Social Connections: Not on file     Vital Signs: There were no vitals taken for this visit.  No physical examination performed in lieu of telephone visit.  Imaging: CT abdomen (07/01/21) IMPRESSION: 1. Stable to slight decrease in size of ablation defect within the left kidney. No complicating features identified. No signs to suggest residual/recurrent tumor. 2. 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 Attention on follow-up imaging advised. 3. Aortic atherosclerosis.   Labs:  CBC: Recent Labs    07/09/20 1215 08/19/20 0630 09/14/20 1536 11/17/20 0724 03/10/21 1423  WBC 9.5  --  8.7 10.0 7.5  HGB 12.1 12.2 11.3* 11.3* 12.3  HCT 38.6 36.0 38.4 36.7 41.1  PLT 266  --  275 245 250    COAGS: Recent Labs    11/17/20 0724  INR 1.2    BMP: Recent Labs    07/09/20 1215 08/19/20 0630 09/14/20 1536 11/17/20 0724 02/04/21 1340 02/22/21 1110 03/10/21 1423 03/28/21 1500 04/11/21 1508 07/01/21 1110  NA 142   < > 142   < > 136 139 142 140 139  --   K 3.4*   < > 4.5   < > 4.5 4.5 4.1 3.6 3.7  --   CL 108   < > 109   < > 103 102 104 102 103  --   CO2 22  --  24   < > 20* 22 28 29 27   --   GLUCOSE 123*   < > 100*   < > 125* 158* 137* 127* 119*  --   BUN 13   < > 21   < > 23 20 17 17 17   --   CALCIUM 9.2  --  9.3   < > 9.3 9.1 9.0 9.1 9.1  --   CREATININE 0.74   < > 0.95   < > 1.20* 1.05* 1.14* 1.10* 0.98 1.10*  GFRNONAA >60  --  58*   < > 47*  --  50* 52* 59*  --   GFRAA >60  --  >60  --   --   --   --   --   --   --    < > = values in this interval not displayed.    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    3 month follow up CT showed excellent treatment response but complicated by small, linear urinoma within the ablated mass.  6 month CT shows no evidence of recurrence or residual tumor, with resolution of the previously visualized small linear urinoma.   -Plan for IR clinic follow up in 6 months with repeat CT abdomen renal mass protocol -follow up with Urology as planned  Electronically Signed: Rosanne Ashing Kadelyn Dimascio 07/06/2021, 11:27 AM   I spent a total of 25 Minutes in telephone clinical consultation, greater than 50% of which was counseling/coordinating care for renal cell carcinoma.

## 2021-07-14 NOTE — Progress Notes (Signed)
Electrophysiology Office Follow up Visit Note:    Date:  07/15/2021   ID:  [name] [name], DOB 1941/10/14, MRN [id number]  PCP:  Glenis Smoker, MD  [facility] Cardiologist:  Elouise Munroe, MD  [facility] Electrophysiologist:  Vickie Epley, MD    Interval History:    [name] [name] is a 80 y.o. female who presents for a follow up visit. They were last seen in clinic 02/22/2021 for persistent atrial fibrillation. She has previously been prescribed amiodarone for her persistent AF but she decided not to take this medication because of the potential off-target effects. At my last appointment, the patient had significant LE edema so her diuretic was increased. This helped her swelling but when she reduced the dose the swelling returned.  She saw Roderic Palau in AF clinic on 04/11/2021 in f/u. She was in sinus rhythm and feeling better at that appointment.   Today she is with her husband in clinic who I have previously met.  She tells me she is doing well overall.  No recurrence of atrial fibrillation after her last cardioversion.  She thinks her fluid status has improved.  Her lower extremity wounds have healed.  She is tolerating the amiodarone and Eliquis without off target effect.   Past Medical History:  Diagnosis Date   Anemia    hx of   Arthritis    knee and shoulders   Atrial fibrillation (HCC)    Carpal tunnel syndrome on right    Coronary artery disease 05/2020   Minimal, nobstructive CAD. Tortuous cororany arteries.   Diabetes mellitus without complication (HCC)    diet controlled no meds in 5-6 yrs   Dyspnea    on exertion    Dysrhythmia 05/2020   afib   GERD (gastroesophageal reflux disease)    No longer taking medications   Hematuria    History of hypothyroidism 30 yrs ago   History of kidney stones    Hypertension    Lower extremity edema    chronic lower extremity edema    Myocardial infarction [facility])    patient mention questionable MI   Renal mass      Past Surgical History:  Procedure Laterality Date   ABDOMINAL HYSTERECTOMY  1992   complete   BILATERAL CARPAL TUNNEL RELEASE  yrs ago   CARDIOVERSION N/A 09/23/2020   Procedure: CARDIOVERSION;  Surgeon: Freada Bergeron, MD;  Location: [place];  Service: Cardiovascular;  Laterality: N/A;   CARDIOVERSION N/A 03/21/2021   Procedure: CARDIOVERSION;  Surgeon: Werner Lean, MD;  Location: [place];  Service: Cardiovascular;  Laterality: N/A;   CHOLECYSTECTOMY  1996   CYSTOSCOPY/URETEROSCOPY/HOLMIUM LASER/STENT PLACEMENT Bilateral 08/19/2020   Procedure: CYSTOSCOPY BILATERAL  RETROGRADE  URETEROSCOPY/HOLMIUM LASER/STENT PLACEMENT;  Surgeon: Irine Seal, MD;  Location: [place];  Service: Urology;  Laterality: Bilateral;   HAMMER TOE SURGERY Right yrs ago   IR RADIOLOGIST EVAL & MGMT  10/26/2020   IR RADIOLOGIST EVAL & MGMT  01/06/2021   IR RADIOLOGIST EVAL & MGMT  03/22/2021   IR RADIOLOGIST EVAL & MGMT  07/06/2021   LEFT HEART CATH AND CORONARY ANGIOGRAPHY N/A 06/02/2020   Procedure: LEFT HEART CATH AND CORONARY ANGIOGRAPHY;  Surgeon: Jettie Booze, MD;  Location: [place] CV LAB;  Service: Cardiovascular;  Laterality: N/A;   RADIOLOGY WITH ANESTHESIA N/A 11/17/2020   Procedure: RADIOLOGY WITH ANESTHESIA RENAL MICROWAVE ABLATION;  Surgeon: Suzette Battiest, MD;  [facility];  Service: Radiology;  Laterality: N/A;   TONSILLECTOMY  age 60    Current Medications: Current Meds  Medication Sig   acetaminophen (TYLENOL) 500 MG tablet Take 1,000 mg by mouth every 6 (six) hours as needed for moderate pain or headache.   amiodarone (PACERONE) 200 MG tablet Take 1 tablet (200 mg total) by mouth daily.   Calcium Carb-Cholecalciferol (CALCIUM 600+D) 600-800 MG-UNIT TABS Take 1 tablet by mouth every evening.    ELIQUIS 5 MG TABS tablet TAKE 1 TABLET BY MOUTH TWICE A DAY   Hydrocortisone-Aloe Vera ([id number]-74/YCXK EX) Apply 1 application topically 2  (two) times daily as needed (itching).   Liniments (BLUE-EMU SUPER STRENGTH) CREA Apply 1 application topically 2 (two) times daily as needed (pain.).    loperamide (IMODIUM A-D) 2 MG tablet Take 2-4 mg by mouth as needed for diarrhea or loose stools.    metoprolol tartrate (LOPRESSOR) 25 MG tablet Take 1.5 tablets (37.5 mg total) by mouth 2 (two) times daily.   Multiple Vitamin (MULTIVITAMIN WITH MINERALS) TABS tablet Take 1 tablet by mouth daily.   Multiple Vitamins-Minerals (PRESERVISION AREDS 2 PO) Take 1 capsule by mouth 2 (two) times daily.    Potassium Chloride ER 20 MEQ TBCR TAKE 2 TABLETS BY MOUTH DAILY   torsemide (DEMADEX) 20 MG tablet Take 1 tablet (20 mg total) by mouth 2 (two) times daily.     Allergies:   Tetanus-diphtheria toxoids td and Cardizem [diltiazem]   Social History   Socioeconomic History   Marital status: Married    Spouse name: Not on file   Number of children: 5   Years of education: Not on file   Highest education level: Not on file  Occupational History   Occupation: Retired-pastor  Tobacco Use   Smoking status: Never   Smokeless tobacco: Never  Vaping Use   Vaping Use: Never used  Substance and Sexual Activity   Alcohol use: Not Currently   Drug use: Never   Sexual activity: Not Currently    Birth control/protection: Surgical  Other Topics Concern   Not on file  Social History Narrative   Not on file   Social Determinants of Health   Financial Resource Strain: Not on file  Food Insecurity: Not on file  Transportation Needs: Not on file  Physical Activity: Not on file  Stress: Not on file  Social Connections: Not on file     Family History: [name] family history includes Diabetes in her paternal grandmother; Hypertension in her father and mother.  ROS:   Please see the history of present illness.    All other systems reviewed and are negative.  EKGs/Labs/Other Studies Reviewed:    The following studies were reviewed  today:   EKG:  The ekg ordered today demonstrates sinus rhythm  Recent Labs: 02/22/2021: TSH 3.160 03/10/2021: ALT 25; Hemoglobin 12.3; Platelets 250 04/11/2021: BUN 17; Potassium 3.7; Sodium 139 07/01/2021: Creatinine, Ser 1.10  Recent Lipid Panel    Component Value Date/Time   CHOL 184 06/01/2020 1704   TRIG 67 06/01/2020 1704   HDL 51 06/01/2020 1704   CHOLHDL 3.6 06/01/2020 1704   VLDL 13 06/01/2020 1704   LDLCALC 120 (H) 06/01/2020 1704    Physical Exam:    VS:  BP 130/82   Pulse (!) 54   Ht _0  (1.676 m)   Wt 254 lb (115.2 kg)   SpO2 97%   BMI 41.00 kg/m     Wt Readings from Last 3 Encounters:  07/15/21 254 lb (  115.2 kg)  04/11/21 245 lb (111.1 kg)  04/07/21 243 lb 6.4 oz (110.4 kg)     GEN:  Well nourished, well developed in no acute distress.  Morbidly obese HEENT: Normal NECK: No JVD; No carotid bruits LYMPHATICS: No lymphadenopathy CARDIAC: RRR, no murmurs, rubs, gallops RESPIRATORY:  Clear to auscultation without rales, wheezing or rhonchi  ABDOMEN: Soft, non-tender, non-distended MUSCULOSKELETAL:  No edema; No deformity.  Wraps on bilateral lower extremities below the knee SKIN: Warm and dry NEUROLOGIC:  Alert and oriented x 3 PSYCHIATRIC:  Normal affect   ASSESSMENT:    1. Persistent atrial fibrillation ([place])   2. Bilateral lower extremity edema   3. Morbid obesity ([place])   4. Encounter for long-term (current) use of high-risk medication    PLAN:    In order of problems listed above:  1. Persistent atrial fibrillation (HCC) Maintaining normal rhythm on amiodarone 200 mg by mouth once daily.  We will check CMP, TSH and free T4 today.  She will follow-up with one of the EP PAs in 6 months.  2. Bilateral lower extremity edema Managed by wound clinic  3. Morbid obesity ([place])   4. Encounter for long-term (current) use of high-risk medication Lab work today.  Follow-up in 6 months.   Medication Adjustments/Labs and Tests Ordered: Current  medicines are reviewed at length with the patient today.  Concerns regarding medicines are outlined above.  Orders Placed This Encounter  Procedures   EKG 12-Lead    No orders of the defined types were placed in this encounter.    Signed, Lars Mage, MD, [facility], [facility] 07/15/2021 2:23 PM    Electrophysiology [facility]

## 2021-07-15 ENCOUNTER — Encounter: Payer: Self-pay | Admitting: Cardiology

## 2021-07-15 ENCOUNTER — Other Ambulatory Visit: Payer: Self-pay

## 2021-07-15 ENCOUNTER — Ambulatory Visit: Payer: Medicare Other | Admitting: Cardiology

## 2021-07-15 VITALS — BP 130/82 | HR 54 | Ht 66.0 in | Wt 254.0 lb

## 2021-07-15 DIAGNOSIS — R600 Localized edema: Secondary | ICD-10-CM | POA: Diagnosis not present

## 2021-07-15 DIAGNOSIS — Z79899 Other long term (current) drug therapy: Secondary | ICD-10-CM | POA: Diagnosis not present

## 2021-07-15 DIAGNOSIS — I4819 Other persistent atrial fibrillation: Secondary | ICD-10-CM

## 2021-07-15 NOTE — Patient Instructions (Addendum)
Medication Instructions:  Your physician recommends that you continue on your current medications as directed. Please refer to the Current Medication list given to you today. *If you need a refill on your cardiac medications before your next appointment, please call your pharmacy*  Lab Work: You will get lab work today:  CMP, TSH and free T4 If you have labs (blood work) drawn today and your tests are completely normal, you will receive your results only by: [place] (if you have [place]) OR A paper copy in the mail If you have any lab test that is abnormal or we need to change your treatment, we will call you to review the results.  Testing/Procedures: None ordered.  Follow-Up: At [facility], you and your health needs are our priority.  As part of our continuing mission to provide you with exceptional heart care, we have created designated Provider Care Teams.  These Care Teams include your primary Cardiologist (physician) and Advanced Practice Providers (APPs -  Physician Assistants and Nurse Practitioners) who all work together to provide you with the care you need, when you need it.  Your next appointment:   Your physician wants you to follow-up in: 6 months with one of the following Advanced Practice Providers on your designated Care Team:   Tommye Standard, [place] Legrand Como "Jonni Sanger" [place], [place] You will receive a reminder letter in the mail two months in advance. If you don't receive a letter, please call our office to schedule the follow-up appointment.

## 2021-07-16 LAB — COMPREHENSIVE METABOLIC PANEL
ALT: 18 IU/L (ref 0–32)
AST: 24 IU/L (ref 0–40)
Albumin/Globulin Ratio: 1.3 (ref 1.2–2.2)
Albumin: 4 g/dL (ref 3.7–4.7)
Alkaline Phosphatase: 125 IU/L — ABNORMAL HIGH (ref 44–121)
BUN/Creatinine Ratio: 14 (ref 12–28)
BUN: 17 mg/dL (ref 8–27)
Bilirubin Total: 0.6 mg/dL (ref 0.0–1.2)
CO2: 21 mmol/L (ref 20–29)
Calcium: 9.7 mg/dL (ref 8.7–10.3)
Chloride: 104 mmol/L (ref 96–106)
Creatinine, Ser: 1.18 mg/dL — ABNORMAL HIGH (ref 0.57–1.00)
Globulin, Total: 3 g/dL (ref 1.5–4.5)
Glucose: 101 mg/dL — ABNORMAL HIGH (ref 65–99)
Potassium: 4.4 mmol/L (ref 3.5–5.2)
Sodium: 144 mmol/L (ref 134–144)
Total Protein: 7 g/dL (ref 6.0–8.5)
eGFR: 48 mL/min/{1.73_m2} — ABNORMAL LOW (ref >59)

## 2021-07-16 LAB — T4, FREE: Free T4: 1.18 ng/dL (ref 0.82–1.77)

## 2021-07-16 LAB — TSH: TSH: 2.77 u[IU]/mL (ref 0.450–4.500)

## 2021-07-26 DIAGNOSIS — M170 Bilateral primary osteoarthritis of knee: Secondary | ICD-10-CM | POA: Diagnosis not present

## 2021-08-10 ENCOUNTER — Other Ambulatory Visit (HOSPITAL_COMMUNITY): Payer: Self-pay | Admitting: Nurse Practitioner

## 2021-09-19 ENCOUNTER — Telehealth: Payer: Self-pay | Admitting: Internal Medicine

## 2021-09-19 NOTE — Telephone Encounter (Signed)
Spoke with Baxter Flattery @ [facility],  Pt is having 1 extraction and would like for pt to hold the Eliquis X's 2 days.

## 2021-09-19 NOTE — Telephone Encounter (Signed)
   Primary Cardiologist: Elouise Munroe, MD  Chart reviewed as part of pre-operative protocol coverage. Simple dental extractions are considered low risk procedures per guidelines and generally do not require any specific cardiac clearance. It is also generally accepted that for simple extractions and dental cleanings, there is no need to interrupt blood thinner therapy.   SBE prophylaxis is not required for the patient.  I will route this recommendation to the requesting party via Epic fax function and remove from pre-op pool.  Please call with questions.  Deberah Pelton, NP 09/19/2021, 4:44 PM

## 2021-09-19 NOTE — Telephone Encounter (Signed)
Please contact the requesting office and ask for the amount of teeth that are being extracted.  We are not able to provide blanket coverage.  Once we know more about the procedure we will be able to provide cardiac recommendations.  Thank you.  Jossie Ng. Anays Detore NP-C    09/19/2021, 4:19 PM [facility] [address] Office [phone] Fax [phone]

## 2021-09-19 NOTE — Telephone Encounter (Signed)
   [facility] Pre-operative Risk Assessment    Request for surgical clearance:  What type of surgery is being performed? Teeth extraction    When is this surgery scheduled? 09/26/2021   What type of clearance is required (medical clearance vs. Pharmacy clearance to hold med vs. Both)? Medical   Are there any medications that need to be held prior to surgery and how long?ELIQUIS 5 MG TABS tablet   Practice name and name of physician performing surgery? Dr. Radford Pax    What is your office phone number [phone]    7.   What is your office fax number [phone]  8.   Anesthesia type (None, local, MAC, general) ? local   [name] [name] 09/19/2021, 4:10 PM  _________________________________________________________________   (provider comments below)

## 2021-09-22 NOTE — Telephone Encounter (Signed)
Requesting office of Cloretta Ned D.D.S., PA sent over a note asking if patient can come off Eliquis for 2-3 days that Dr. Radford Pax prefers this. Please advise.

## 2021-09-22 NOTE — Telephone Encounter (Signed)
We will likely plan to return clearance that we would advise not holding for single tooth extraction but appreciate pharmD input on minimum time to hold. Already routed to pharm.

## 2021-09-23 NOTE — Telephone Encounter (Signed)
Agree that guidelines do not recommend holding Eliquis for a single dental extraction. 2-3 day hold would be excessive. Can hold Eliquis the night before and morning of if needed.   CHA2DS2-VASc Score = 6  This indicates a 9.7% annual risk of stroke. [name] score is based upon: CHF History: 0 HTN History: 1 Diabetes History: 1 Stroke History: 0 Vascular Disease History: 1 Age Score: 2 Gender Score: 1

## 2021-09-27 NOTE — Telephone Encounter (Signed)
Please check to see if preop clearance is still needed. The procedure was supposed to be yesterday

## 2021-09-27 NOTE — Telephone Encounter (Signed)
Per pre op provider today Almyra Deforest, [facility] asked if I would call the DDS office and confirm if the pt had the dental work as planned, see previous notes. I s/w dental office and was stated that our office did not give the answer that Dr. Radford Pax, DDS was asking to hold Eliquis x 2 days prior to 1 tooth being extracted. I did inform DDS office that our office did in fact give recommendations:  Supple, Megan E, RPH-CPP 4 days ago   Agree that guidelines do not recommend holding Eliquis for a single dental extraction. 2-3 day hold would be excessive. Can hold Eliquis the night before and morning of if needed.    CHA2DS2-VASc Score = 6  This indicates a 9.7% annual risk of stroke. [name] score is based upon: CHF History: 0 HTN History: 1 Diabetes History: 1 Stroke History: 0 Vascular Disease History: 1 Age Score: 2 Gender Score: 1     DDS office then state to me that pt had her tooth extraction yesterday as planned. I thanked her for her time.

## 2021-10-07 ENCOUNTER — Other Ambulatory Visit: Payer: Self-pay

## 2021-10-07 ENCOUNTER — Ambulatory Visit (HOSPITAL_COMMUNITY): Payer: Medicare Other | Attending: Cardiology

## 2021-10-07 DIAGNOSIS — I350 Nonrheumatic aortic (valve) stenosis: Secondary | ICD-10-CM

## 2021-10-07 LAB — ECHOCARDIOGRAM COMPLETE
AR max vel: 0.98 cm2
AV Area VTI: 0.86 cm2
AV Area mean vel: 0.87 cm2
AV Mean grad: 26 mmHg
AV Peak grad: 42.3 mmHg
Ao pk vel: 3.25 m/s
Area-P 1/2: 1.87 cm2
S' Lateral: 3.2 cm

## 2021-10-10 ENCOUNTER — Telehealth: Payer: Self-pay | Admitting: *Deleted

## 2021-10-10 NOTE — Telephone Encounter (Signed)
Left detailed message for patient (DPR) on both phones of no change in echo or aortic stenosis and that if she is feeling well we will plan to repeat echo in April 2022.  Asked that she call back if she is experiencing fatigue, shortness of breath or any new swelling or concerns.

## 2021-10-10 NOTE — Telephone Encounter (Signed)
-----   Message from Burnell Blanks, MD sent at 10/09/2021  1:12 PM EDT ----- No change in echo or aortic stenosis. If she is doing well, will plan to repeat her echo in 6 months. chris

## 2021-10-12 DIAGNOSIS — I350 Nonrheumatic aortic (valve) stenosis: Secondary | ICD-10-CM

## 2021-10-13 NOTE — Telephone Encounter (Signed)
Reply is in response to message I left patient re: her unchanged echo.  If she is feeling well without symptoms of aortic stenosis we will repeat echo in 6 months per Dr. Angelena Form.  Order for echo placed for April 2023.

## 2021-10-20 ENCOUNTER — Other Ambulatory Visit: Payer: Self-pay

## 2021-10-20 MED ORDER — POTASSIUM CHLORIDE ER 20 MEQ PO TBCR: 2.0000 | EXTENDED_RELEASE_TABLET | Freq: Every day | ORAL | 2 refills | Status: DC

## 2021-10-20 MED ORDER — POTASSIUM CHLORIDE ER 20 MEQ PO TBCR
2.0000 | EXTENDED_RELEASE_TABLET | Freq: Every day | ORAL | 2 refills | Status: DC
Start: 2021-10-20 — End: 2021-10-20

## 2021-12-17 ENCOUNTER — Other Ambulatory Visit (HOSPITAL_COMMUNITY): Payer: Self-pay | Admitting: Nurse Practitioner

## 2021-12-17 DIAGNOSIS — I4819 Other persistent atrial fibrillation: Secondary | ICD-10-CM

## 2021-12-20 NOTE — Telephone Encounter (Signed)
Prescription refill request for Eliquis received. Indication: a fib Last office visit: 07/15/21 Scr: 1.18 Age: 81 Weight: 115kg

## 2021-12-27 ENCOUNTER — Other Ambulatory Visit: Payer: Self-pay | Admitting: Interventional Radiology

## 2021-12-27 DIAGNOSIS — N2889 Other specified disorders of kidney and ureter: Secondary | ICD-10-CM

## 2022-01-09 ENCOUNTER — Other Ambulatory Visit: Payer: Self-pay

## 2022-01-09 ENCOUNTER — Encounter (HOSPITAL_COMMUNITY): Payer: Self-pay

## 2022-01-09 ENCOUNTER — Ambulatory Visit (HOSPITAL_COMMUNITY)
Admission: RE | Admit: 2022-01-09 | Discharge: 2022-01-09 | Disposition: A | Discharge status: Home / Self Care | Payer: Medicare Other | Source: Ambulatory Visit | Attending: Interventional Radiology | Admitting: Interventional Radiology

## 2022-01-09 DIAGNOSIS — N200 Calculus of kidney: Secondary | ICD-10-CM | POA: Diagnosis not present

## 2022-01-09 DIAGNOSIS — I722 Aneurysm of renal artery: Secondary | ICD-10-CM | POA: Diagnosis not present

## 2022-01-09 DIAGNOSIS — Z85528 Personal history of other malignant neoplasm of kidney: Secondary | ICD-10-CM | POA: Diagnosis not present

## 2022-01-09 DIAGNOSIS — N2889 Other specified disorders of kidney and ureter: Secondary | ICD-10-CM | POA: Insufficient documentation

## 2022-01-09 DIAGNOSIS — I700 Atherosclerosis of aorta: Secondary | ICD-10-CM | POA: Diagnosis not present

## 2022-01-09 LAB — POCT I-STAT CREATININE: Creatinine, Ser: 1.5 mg/dL — ABNORMAL HIGH (ref 0.44–1.00)

## 2022-01-09 MED ORDER — SODIUM CHLORIDE (PF) 0.9 % IJ SOLN
INTRAMUSCULAR | Status: AC
  Filled 2022-01-09: qty 50

## 2022-01-09 MED ORDER — IOHEXOL 300 MG/ML  SOLN
100.0000 mL | Freq: Once | INTRAMUSCULAR | Status: AC | PRN
  Administered 2022-01-09: 75 mL via INTRAVENOUS

## 2022-01-11 ENCOUNTER — Other Ambulatory Visit: Payer: Self-pay

## 2022-01-11 ENCOUNTER — Encounter: Payer: Self-pay | Admitting: *Deleted

## 2022-01-11 ENCOUNTER — Ambulatory Visit
Admission: RE | Admit: 2022-01-11 | Discharge: 2022-01-11 | Disposition: A | Discharge status: Home / Self Care | Payer: Medicare Other | Source: Ambulatory Visit | Attending: Interventional Radiology | Admitting: Interventional Radiology

## 2022-01-11 DIAGNOSIS — Z9889 Other specified postprocedural states: Secondary | ICD-10-CM | POA: Diagnosis not present

## 2022-01-11 DIAGNOSIS — N2889 Other specified disorders of kidney and ureter: Secondary | ICD-10-CM | POA: Diagnosis not present

## 2022-01-11 HISTORY — PX: IR RADIOLOGIST EVAL & MGMT: IMG5224

## 2022-01-11 NOTE — Progress Notes (Signed)
Referring Physician(s): Jacalyn Lefevre, MD   Chief Complaint: The patient is seen in follow up today s/p renal mass ablation   History of present illness:   [name] [name] is a 81 y.o. female presenting as a scheduled follow up to [place] clinic today, status post treatment of left renal tumor with biopsy.   She joins [place] today virtually, and we confirmed her identity with 2 personal identifiers.   She was treated on 11/17/2020, with image guided biopsy and microwave ablation of left renal tumor.  This path was:    She presents today for 1 year follow up. She continues to recover well.  No back or flank pain, no hematuria, no dysuria, no fevers/chills.  Continues to experience fatigue which she attributes to her cardiac issues.  Past Medical History:  Diagnosis Date   Anemia    hx of   Arthritis    knee and shoulders   Atrial fibrillation (HCC)    Carpal tunnel syndrome on right    Coronary artery disease 05/2020   Minimal, nobstructive CAD. Tortuous cororany arteries.   Diabetes mellitus without complication (HCC)    diet controlled no meds in 5-6 yrs   Dyspnea    on exertion    Dysrhythmia 05/2020   afib   GERD (gastroesophageal reflux disease)    No longer taking medications   Hematuria    History of hypothyroidism 30 yrs ago   History of kidney stones    Hypertension    Lower extremity edema    chronic lower extremity edema    Myocardial infarction [facility])    patient mention questionable MI   Renal mass     Past Surgical History:  Procedure Laterality Date   ABDOMINAL HYSTERECTOMY  1992   complete   BILATERAL CARPAL TUNNEL RELEASE  yrs ago   CARDIOVERSION N/A 09/23/2020   Procedure: CARDIOVERSION;  Surgeon: Freada Bergeron, MD;  Location: [place];  Service: Cardiovascular;  Laterality: N/A;   CARDIOVERSION N/A 03/21/2021   Procedure: CARDIOVERSION;  Surgeon: Werner Lean, MD;  Location: [place];  Service: Cardiovascular;  Laterality: N/A;    CHOLECYSTECTOMY  1996   CYSTOSCOPY/URETEROSCOPY/HOLMIUM LASER/STENT PLACEMENT Bilateral 08/19/2020   Procedure: CYSTOSCOPY BILATERAL  RETROGRADE  URETEROSCOPY/HOLMIUM LASER/STENT PLACEMENT;  Surgeon: Irine Seal, MD;  Location: [place];  Service: Urology;  Laterality: Bilateral;   HAMMER TOE SURGERY Right yrs ago   IR RADIOLOGIST EVAL & MGMT  10/26/2020   IR RADIOLOGIST EVAL & MGMT  01/06/2021   IR RADIOLOGIST EVAL & MGMT  03/22/2021   IR RADIOLOGIST EVAL & MGMT  07/06/2021   LEFT HEART CATH AND CORONARY ANGIOGRAPHY N/A 06/02/2020   Procedure: LEFT HEART CATH AND CORONARY ANGIOGRAPHY;  Surgeon: Jettie Booze, MD;  Location: [place] CV LAB;  Service: Cardiovascular;  Laterality: N/A;   RADIOLOGY WITH ANESTHESIA N/A 11/17/2020   Procedure: RADIOLOGY WITH ANESTHESIA RENAL MICROWAVE ABLATION;  Surgeon: Suzette Battiest, MD;  [facility];  Service: Radiology;  Laterality: N/A;   TONSILLECTOMY  age 77    Allergies: Tetanus-diphtheria toxoids td and Cardizem [diltiazem]  Medications: Prior to Admission medications   Medication Sig Start Date End Date Taking? Authorizing Provider  acetaminophen (TYLENOL) 500 MG tablet Take 1,000 mg by mouth every 6 (six) hours as needed for moderate pain or headache.    [provider]  amiodarone (PACERONE) 200 MG tablet Take 1 tablet (200 mg total) by mouth daily. 03/10/21   Sherran Needs,  NP  Calcium Carb-Cholecalciferol (CALCIUM 600+D) 600-800 MG-UNIT TABS Take 1 tablet by mouth every evening.     [provider]  ELIQUIS 5 MG TABS tablet TAKE 1 TABLET BY MOUTH TWICE A DAY 12/20/21   Vickie Epley, MD  Hydrocortisone-Aloe Vera (GEXBMWUXL-24/MWNU EX) Apply 1 application topically 2 (two) times daily as needed (itching).    [provider]  Liniments (BLUE-EMU SUPER STRENGTH) CREA Apply 1 application topically 2 (two) times daily as needed (pain.).     [provider]  loperamide (IMODIUM A-D) 2 MG  tablet Take 2-4 mg by mouth as needed for diarrhea or loose stools.     [provider]  metoprolol tartrate (LOPRESSOR) 25 MG tablet Take 1.5 tablets (37.5 mg total) by mouth 2 (two) times daily. 05/30/21   Vickie Epley, MD  Multiple Vitamin (MULTIVITAMIN WITH MINERALS) TABS tablet Take 1 tablet by mouth daily.    [provider]  Multiple Vitamins-Minerals (PRESERVISION AREDS 2 PO) Take 1 capsule by mouth 2 (two) times daily.     [provider]  Potassium Chloride ER 20 MEQ TBCR Take 2 tablets by mouth daily. 10/20/21   Vickie Epley, MD  torsemide (DEMADEX) 20 MG tablet TAKE 1 TABLET BY MOUTH TWICE A DAY 08/10/21   Vickie Epley, MD     Family History  Problem Relation Age of Onset   Hypertension Mother    Hypertension Father    Diabetes Paternal Grandmother     Social History   Socioeconomic History   Marital status: Married    Spouse name: Not on file   Number of children: 5   Years of education: Not on file   Highest education level: Not on file  Occupational History   Occupation: Retired-pastor  Tobacco Use   Smoking status: Never   Smokeless tobacco: Never  Vaping Use   Vaping Use: Never used  Substance and Sexual Activity   Alcohol use: Not Currently   Drug use: Never   Sexual activity: Not Currently    Birth control/protection: Surgical  Other Topics Concern   Not on file  Social History Narrative   Not on file   Social Determinants of Health   Financial Resource Strain: Not on file  Food Insecurity: Not on file  Transportation Needs: Not on file  Physical Activity: Not on file  Stress: Not on file  Social Connections: Not on file     Vital Signs: There were no vitals taken for this visit.  No physical examination was performed in lieu of virtual telephone clinic visit.   Imaging: CT AP 01/09/22 IMPRESSION: 1. Stable post ablation changes involving the upper pole region of the left kidney. No evidence of  residual or recurrent enhancing tumor. 2. Small bilateral renal calculi. 3. 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   Labs:  CBC: Recent Labs    03/10/21 1423  WBC 7.5  HGB 12.3  HCT 41.1  PLT 250    COAGS: No results for input(s): INR, APTT in the last 8760 hours.  BMP: Recent Labs    02/04/21 1340 02/22/21 1110 03/10/21 1423 03/28/21 1500 04/11/21 1508 07/01/21 1110 07/15/21 1427 01/09/22 1340  NA 136   < > 142 140 139  --  144  --   K 4.5   < > 4.1 3.6 3.7  --  4.4  --   CL 103   < > 104 102 103  --  104  --  CO2 20*   < > 28 29 27   --  21  --   GLUCOSE 125*   < > 137* 127* 119*  --  101*  --   BUN 23   < > 17 17 17   --  17  --   CALCIUM 9.3   < > 9.0 9.1 9.1  --  9.7  --   CREATININE 1.20*   < > 1.14* 1.10* 0.98 1.10* 1.18* 1.50*  GFRNONAA 47*  --  50* 52* 59*  --   --   --    < > = values in this interval not displayed.    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    3 month follow up CT showed excellent treatment response but complicated by small, linear urinoma within the ablated mass.  6 and 12 month CTs show no evidence of recurrence or residual tumor, with resolution of the previously visualized small linear urinoma.   -Plan for IR clinic follow up in 12 months with repeat CT abdomen renal mass protocol -follow up with Urology as planned  Electronically Signed: Suzette Battiest 01/11/2022, 10:52 AM   I spent a total of 25 Minutes in telephone virtual clinical consultation, greater than 50% of which was counseling/coordinating care for renal cell carcinoma.

## 2022-01-25 ENCOUNTER — Other Ambulatory Visit: Payer: Self-pay | Admitting: Physician Assistant

## 2022-03-29 ENCOUNTER — Other Ambulatory Visit: Payer: Self-pay

## 2022-03-29 MED ORDER — TORSEMIDE 20 MG PO TABS: 20.0000 mg | ORAL_TABLET | Freq: Two times a day (BID) | ORAL | 3 refills | Status: DC

## 2022-03-30 DIAGNOSIS — C642 Malignant neoplasm of left kidney, except renal pelvis: Secondary | ICD-10-CM | POA: Diagnosis not present

## 2022-03-30 DIAGNOSIS — M7989 Other specified soft tissue disorders: Secondary | ICD-10-CM | POA: Diagnosis not present

## 2022-03-30 DIAGNOSIS — Z0000 Encounter for general adult medical examination without abnormal findings: Secondary | ICD-10-CM | POA: Diagnosis not present

## 2022-03-30 DIAGNOSIS — I4891 Unspecified atrial fibrillation: Secondary | ICD-10-CM | POA: Diagnosis not present

## 2022-03-30 DIAGNOSIS — E7800 Pure hypercholesterolemia, unspecified: Secondary | ICD-10-CM | POA: Diagnosis not present

## 2022-03-30 DIAGNOSIS — M8580 Other specified disorders of bone density and structure, unspecified site: Secondary | ICD-10-CM | POA: Diagnosis not present

## 2022-03-30 DIAGNOSIS — I350 Nonrheumatic aortic (valve) stenosis: Secondary | ICD-10-CM | POA: Diagnosis not present

## 2022-03-30 DIAGNOSIS — D6869 Other thrombophilia: Secondary | ICD-10-CM | POA: Diagnosis not present

## 2022-03-31 ENCOUNTER — Telehealth: Payer: Self-pay | Admitting: *Deleted

## 2022-03-31 ENCOUNTER — Other Ambulatory Visit: Payer: Self-pay | Admitting: Family Medicine

## 2022-03-31 ENCOUNTER — Encounter: Payer: Self-pay | Admitting: Internal Medicine

## 2022-03-31 NOTE — Telephone Encounter (Signed)
HR is 43 at visit yesterday.  Dr. Lindell Noe wants to know if okay to decrease metoprolol.  No ekg was done.  I transferred the call to the office DOD. ?

## 2022-03-31 NOTE — Progress Notes (Unsigned)
Called by [facility] for slow HR 40's.  She is on amiodarone for rhythm control.  Blood pressure was 138 yesterday.  Suggested they decrease metoprolol from 37.5 twice daily--12.5 twice daily.  She is post to see in our office next week and will have to attend to her blood pressure issues at that time. ?

## 2022-04-03 ENCOUNTER — Encounter: Payer: Self-pay | Admitting: Cardiovascular Disease

## 2022-04-03 ENCOUNTER — Ambulatory Visit (HOSPITAL_COMMUNITY): Payer: Medicare Other | Attending: Cardiology

## 2022-04-03 ENCOUNTER — Ambulatory Visit: Payer: Medicare Other | Admitting: Cardiovascular Disease

## 2022-04-03 VITALS — BP 168/62 | HR 68 | Ht 66.0 in | Wt 277.0 lb

## 2022-04-03 DIAGNOSIS — I350 Nonrheumatic aortic (valve) stenosis: Secondary | ICD-10-CM

## 2022-04-03 LAB — BASIC METABOLIC PANEL
BUN/Creatinine Ratio: 15 (ref 12–28)
BUN: 18 mg/dL (ref 8–27)
CO2: 22 mmol/L (ref 20–29)
Calcium: 9.7 mg/dL (ref 8.7–10.3)
Chloride: 107 mmol/L — ABNORMAL HIGH (ref 96–106)
Creatinine, Ser: 1.2 mg/dL — ABNORMAL HIGH (ref 0.57–1.00)
Glucose: 140 mg/dL — ABNORMAL HIGH (ref 70–99)
Potassium: 5.1 mmol/L (ref 3.5–5.2)
Sodium: 145 mmol/L — ABNORMAL HIGH (ref 134–144)
eGFR: 46 mL/min/{1.73_m2} — ABNORMAL LOW (ref >59)

## 2022-04-03 LAB — ECHOCARDIOGRAM COMPLETE
AR max vel: 0.77 cm2
AV Area VTI: 0.75 cm2
AV Area mean vel: 0.76 cm2
AV Mean grad: 33.3 mmHg
AV Peak grad: 59.5 mmHg
Ao pk vel: 3.86 m/s
Area-P 1/2: 2.43 cm2
MV VTI: 1.06 cm2
S' Lateral: 3.8 cm

## 2022-04-03 MED ORDER — METOPROLOL TARTRATE 25 MG PO TABS: 12.5000 mg | ORAL_TABLET | Freq: Two times a day (BID) | ORAL | 3 refills | Status: DC

## 2022-04-03 MED ORDER — TORSEMIDE 20 MG PO TABS: 20.0000 mg | ORAL_TABLET | Freq: Every day | ORAL | 3 refills | Status: DC

## 2022-04-03 NOTE — Progress Notes (Addendum)
? ? ?Structural Heart Clinic Note ? ?Chief Complaint  ?Patient presents with  ? Follow-up  ?  Aortic stenosis  ? ?History of Present Illness: 81 yo female with history of arthritis, HTN, diet controlled diabetes, persistent atrial fibrillation, mild CAD, GERD and moderate aortic stenosis who is here today for follow up. I saw her as a new consult for discussion regarding her aortic stenosis in April 2022. She was referred by Dr. Margaretann Loveless. She has been followed in the atrial fibrillation clinic for paroxysmal atrial fibrillation. She has been on amiodarone and Eliquis. Cardiac cath June 2021 with mild non-obstructive CAD. Normal LV filling pressures. She has been followed for moderate aortic stenosis since June 2021. Echo 03/29/21 with LVEF=50-55%. Normal RV function. Mild mitral regurgitation and mild mitral stenosis. The aortic valve leaflets are thickened and calcified. Mean gradient 26 mmHg, peak gradient 40 mmHg, AVA 0.8-0.9 cm2, dimensionless index 0.29, SVI 29. This is consistent with moderate aortic stenosis. She has had ongoing issues with fluid retention and is on Torsemide dialy. She is followed in the wound clinic in [place]. She is wearing Una boots. She has had issues with kidney stones. She has had an ablation for renal cell carcinoma.  Echo October 2022 with LVEF=60-65%. Moderately severe aortic stenosis with mean gradient 26 mmHg, AVA 0.86 cm2, DI 0.27, SVI 37.  ?Echo today has not been read by our imaging team but her LV function appears to be normal. Mean gradient across the aortic valve is 34 mmHg. AVA 0.75 cm2. DI 0.25.  ? ?She tells me today that she has been doing well. Minimal LE edema. Torsemide held by primary care last week per patient after her creatinine was abnormal. Lopressor dose lowered to 12.5 mg po BID due to bradycardia. She has baseline dyspnea on exertion but feels much better since being on Torsemide over the past year. No chest pain or dizziness. She had her tooth extracted.  She is following with her dentist regularly.  ? ?She lives in [place] with her husband and is a retired Theme park manager.  ? ?Primary Care Physician: Glenis Smoker, MD ?Primary Cardiologist: Margaretann Loveless ?Referring Cardiologist: Margaretann Loveless ? ?Past Medical History:  ?Diagnosis Date  ? Anemia   ? hx of  ? Arthritis   ? knee and shoulders  ? Atrial fibrillation ([place])   ? Carpal tunnel syndrome on right   ? Coronary artery disease 05/2020  ? Minimal, nobstructive CAD. Tortuous cororany arteries.  ? Diabetes mellitus without complication ([place])   ? diet controlled no meds in 5-6 yrs  ? Dyspnea   ? on exertion   ? Dysrhythmia 05/2020  ? afib  ? GERD (gastroesophageal reflux disease)   ? No longer taking medications  ? Hematuria   ? History of hypothyroidism 30 yrs ago  ? History of kidney stones   ? Hypertension   ? Lower extremity edema   ? chronic lower extremity edema   ? Myocardial infarction [facility])   ? patient mention questionable MI  ? Renal mass   ? ? ?Past Surgical History:  ?Procedure Laterality Date  ? ABDOMINAL HYSTERECTOMY  1992  ? complete  ? BILATERAL CARPAL TUNNEL RELEASE  yrs ago  ? CARDIOVERSION N/A 09/23/2020  ? Procedure: CARDIOVERSION;  Surgeon: Freada Bergeron, MD;  Location: [place];  Service: Cardiovascular;  Laterality: N/A;  ? CARDIOVERSION N/A 03/21/2021  ? Procedure: CARDIOVERSION;  Surgeon: Werner Lean, MD;  Location: [place];  Service: Cardiovascular;  Laterality: N/A;  ? CHOLECYSTECTOMY  1996  ? CYSTOSCOPY/URETEROSCOPY/HOLMIUM LASER/STENT PLACEMENT Bilateral 08/19/2020  ? Procedure: CYSTOSCOPY BILATERAL  RETROGRADE  URETEROSCOPY/HOLMIUM LASER/STENT PLACEMENT;  Surgeon: Irine Seal, MD;  Location: [facility];  Service: Urology;  Laterality: Bilateral;  ? HAMMER TOE SURGERY Right yrs ago  ? IR RADIOLOGIST EVAL & MGMT  10/26/2020  ? IR RADIOLOGIST EVAL & MGMT  01/06/2021  ? IR RADIOLOGIST EVAL & MGMT  03/22/2021  ? IR RADIOLOGIST EVAL & MGMT  07/06/2021  ? IR RADIOLOGIST  EVAL & MGMT  01/11/2022  ? LEFT HEART CATH AND CORONARY ANGIOGRAPHY N/A 06/02/2020  ? Procedure: LEFT HEART CATH AND CORONARY ANGIOGRAPHY;  Surgeon: Jettie Booze, MD;  Location: [place] [facility];  Service: Cardiovascular;  Laterality: N/A;  ? RADIOLOGY WITH ANESTHESIA N/A 11/17/2020  ? Procedure: RADIOLOGY WITH ANESTHESIA RENAL MICROWAVE ABLATION;  Surgeon: Suzette Battiest, MD;  Location: [facility];  Service: Radiology;  Laterality: N/A;  ? TONSILLECTOMY  age 42  ? ? ?Current Outpatient Medications  ?Medication Sig Dispense Refill  ? acetaminophen (TYLENOL) 500 MG tablet Take 1,000 mg by mouth every 6 (six) hours as needed for moderate pain or headache.    ? amiodarone (PACERONE) 200 MG tablet Take 1 tablet (200 mg total) by mouth daily. 120 tablet 3  ? Calcium Carb-Cholecalciferol (CALCIUM 600+D) 600-800 MG-UNIT TABS Take 1 tablet by mouth every evening.     ? ELIQUIS 5 MG TABS tablet TAKE 1 TABLET BY MOUTH TWICE A DAY 60 tablet 6  ? Hydrocortisone-Aloe Vera ([id number]-41/DQQI EX) Apply 1 application topically 2 (two) times daily as needed (itching).    ? Liniments (BLUE-EMU SUPER STRENGTH) CREA Apply 1 application topically 2 (two) times daily as needed (pain.).     ? loperamide (IMODIUM A-D) 2 MG tablet Take 2-4 mg by mouth as needed for diarrhea or loose stools.     ? metoprolol tartrate (LOPRESSOR) 25 MG tablet Take 0.5 tablets (12.5 mg total) by mouth 2 (two) times daily. 90 tablet 3  ? Multiple Vitamin (MULTIVITAMIN WITH MINERALS) TABS tablet Take 1 tablet by mouth daily.    ? Multiple Vitamins-Minerals (PRESERVISION AREDS 2 PO) Take 1 capsule by mouth 2 (two) times daily.     ? Potassium Chloride ER 20 MEQ TBCR Take 2 tablets by mouth daily. 180 tablet 2  ? torsemide (DEMADEX) 20 MG tablet Take 1 tablet (20 mg total) by mouth daily. 90 tablet 3  ? ?No current facility-administered medications for this visit.  ? ? ?Allergies  ?Allergen Reactions  ? Tetanus-Diphtheria Toxoids Td Other (See Comments)  ?   Felt very sick  ? Cardizem [Diltiazem] Rash  ? ? ?Social History  ? ?Socioeconomic History  ? Marital status: Married  ?  Spouse name: Not on file  ? Number of children: 5  ? Years of education: Not on file  ? Highest education level: Not on file  ?Occupational History  ? Occupation: Retired-pastor  ?Tobacco Use  ? Smoking status: Never  ? Smokeless tobacco: Never  ?Vaping Use  ? Vaping Use: Never used  ?Substance and Sexual Activity  ? Alcohol use: Not Currently  ? Drug use: Never  ? Sexual activity: Not Currently  ?  Birth control/protection: Surgical  ?Other Topics Concern  ? Not on file  ?Social History Narrative  ? Not on file  ? ?Social Determinants of Health  ? ?Financial Resource Strain: Not on file  ?Food Insecurity: Not on file  ?Transportation Needs: Not on file  ?Physical Activity:  Not on file  ?Stress: Not on file  ?Social Connections: Not on file  ?Intimate Partner Violence: Not on file  ? ? ?Family History  ?Problem Relation Age of Onset  ? Hypertension Mother   ? Hypertension Father   ? Diabetes Paternal Grandmother   ? ? ?Review of Systems:  As stated in the HPI and otherwise negative.  ? ?BP (!) 168/62   Pulse 68   Ht '5\' 6"'$  (1.676 m)   Wt 277 lb (125.6 kg)   SpO2 95%   BMI 44.71 kg/m?  ? ?Physical Examination: ? ?General: Well developed, well nourished, NAD  ?HEENT: OP clear, mucus membranes moist  ?SKIN: warm, dry. No rashes. ?Neuro: No focal deficits  ?Musculoskeletal: Muscle strength 5/5 all ext  ?Psychiatric: Mood and affect normal  ?Neck: No JVD, no carotid bruits, no thyromegaly, no lymphadenopathy.  ?Lungs:Clear bilaterally, no wheezes, rhonci, crackles ?Cardiovascular: Regular rate and rhythm. Harsh systolic murmur.  ?Abdomen:Soft. Bowel sounds present. Non-tender.  ?Extremities: No lower extremity edema. Pulses are 2 + in the bilateral DP/PT. ? ?EKG:  EKG is ordered today. ?The ekg shows Sinus ? ?Echo 04/03/22: ? ? 1. Left ventricular ejection fraction, by estimation, is 60 to 65%. The   ?left ventricle has normal function. The left ventricle has no regional  ?wall motion abnormalities. There is mild left ventricular hypertrophy.  ?Left ventricular diastolic parameters  ?are indeterminate.

## 2022-04-03 NOTE — Patient Instructions (Signed)
Medication Instructions:  ?Your physician has recommended you make the following change in your medication:  ?1.) CHANGE TORSEMIDE TO 20 MG - ONE TABLET DAILY ?2.) CONTINUE METOPROLOL TARTRATE 25 MG --HALF TABLET TWICE A DAY ? ?*If you need a refill on your cardiac medications before your next appointment, please call your pharmacy* ? ? ?Lab Work: ?Today: BMET ?Please return in 10-14 days - BMET ? ? ?Testing/Procedures: DUE IN 6 MONTHS ?Your physician has requested that you have an echocardiogram. Echocardiography is a painless test that uses sound waves to create images of your heart. It provides your doctor with information about the size and shape of your heart and how well your heart?s chambers and valves are working. This procedure takes approximately one hour. There are no restrictions for this procedure. ? ? ?Follow-Up:  ?6 month appointment same day as echocardiogram ? ?Your next appointment:   ?6 month(s) ? ?The format for your next appointment:   ?In Person ? ?Provider:   ?Lauree Chandler, MD{ ? ?Please schedule overdue follow up appointment with Dr. Quentin Ore as well. ? ? ?Important Information About Sugar ? ? ? ? ?  ?

## 2022-04-05 ENCOUNTER — Other Ambulatory Visit: Payer: Self-pay | Admitting: Family Medicine

## 2022-04-05 DIAGNOSIS — M8580 Other specified disorders of bone density and structure, unspecified site: Secondary | ICD-10-CM

## 2022-04-17 ENCOUNTER — Other Ambulatory Visit: Payer: Medicare Other | Admitting: *Deleted

## 2022-04-17 DIAGNOSIS — I350 Nonrheumatic aortic (valve) stenosis: Secondary | ICD-10-CM

## 2022-04-18 LAB — BASIC METABOLIC PANEL
BUN/Creatinine Ratio: 17 (ref 12–28)
BUN: 18 mg/dL (ref 8–27)
CO2: 23 mmol/L (ref 20–29)
Calcium: 9.4 mg/dL (ref 8.7–10.3)
Chloride: 106 mmol/L (ref 96–106)
Creatinine, Ser: 1.08 mg/dL — ABNORMAL HIGH (ref 0.57–1.00)
Glucose: 82 mg/dL (ref 70–99)
Potassium: 4.6 mmol/L (ref 3.5–5.2)
Sodium: 144 mmol/L (ref 134–144)
eGFR: 53 mL/min/{1.73_m2} — ABNORMAL LOW (ref >59)

## 2022-04-18 NOTE — Progress Notes (Signed)
? ?PCP:  Glenis Smoker, MD ?Primary Cardiologist: Elouise Munroe, MD ?Electrophysiologist: Vickie Epley, MD  ? ?[name] [name] is a 81 y.o. female seen today for Vickie Epley, MD for routine electrophysiology followup.  Since last being seen in our clinic the patient reports doing very well. She has had no further significant bradycardia since lopressor decreased. HRs 50-60s.  she denies chest pain, palpitations, dyspnea, PND, orthopnea, nausea, vomiting, dizziness, syncope, edema, weight gain, or early satiety. ? ?Past Medical History:  ?Diagnosis Date  ? Anemia   ? hx of  ? Arthritis   ? knee and shoulders  ? Atrial fibrillation ([place])   ? Carpal tunnel syndrome on right   ? Coronary artery disease 05/2020  ? Minimal, nobstructive CAD. Tortuous cororany arteries.  ? Diabetes mellitus without complication ([place])   ? diet controlled no meds in 5-6 yrs  ? Dyspnea   ? on exertion   ? Dysrhythmia 05/2020  ? afib  ? GERD (gastroesophageal reflux disease)   ? No longer taking medications  ? Hematuria   ? History of hypothyroidism 30 yrs ago  ? History of kidney stones   ? Hypertension   ? Lower extremity edema   ? chronic lower extremity edema   ? Myocardial infarction [facility])   ? patient mention questionable MI  ? Renal mass   ? ?Past Surgical History:  ?Procedure Laterality Date  ? ABDOMINAL HYSTERECTOMY  1992  ? complete  ? BILATERAL CARPAL TUNNEL RELEASE  yrs ago  ? CARDIOVERSION N/A 09/23/2020  ? Procedure: CARDIOVERSION;  Surgeon: Freada Bergeron, MD;  Location: [place];  Service: Cardiovascular;  Laterality: N/A;  ? CARDIOVERSION N/A 03/21/2021  ? Procedure: CARDIOVERSION;  Surgeon: Werner Lean, MD;  Location: [place] [facility];  Service: Cardiovascular;  Laterality: N/A;  ? CHOLECYSTECTOMY  1996  ? CYSTOSCOPY/URETEROSCOPY/HOLMIUM LASER/STENT PLACEMENT Bilateral 08/19/2020  ? Procedure: CYSTOSCOPY BILATERAL  RETROGRADE  URETEROSCOPY/HOLMIUM LASER/STENT PLACEMENT;  Surgeon: Irine Seal,  MD;  Location: [facility];  Service: Urology;  Laterality: Bilateral;  ? HAMMER TOE SURGERY Right yrs ago  ? IR RADIOLOGIST EVAL & MGMT  10/26/2020  ? IR RADIOLOGIST EVAL & MGMT  01/06/2021  ? IR RADIOLOGIST EVAL & MGMT  03/22/2021  ? IR RADIOLOGIST EVAL & MGMT  07/06/2021  ? IR RADIOLOGIST EVAL & MGMT  01/11/2022  ? LEFT HEART CATH AND CORONARY ANGIOGRAPHY N/A 06/02/2020  ? Procedure: LEFT HEART CATH AND CORONARY ANGIOGRAPHY;  Surgeon: Jettie Booze, MD;  Location: [place] [facility];  Service: Cardiovascular;  Laterality: N/A;  ? RADIOLOGY WITH ANESTHESIA N/A 11/17/2020  ? Procedure: RADIOLOGY WITH ANESTHESIA RENAL MICROWAVE ABLATION;  Surgeon: Suzette Battiest, MD;  [facility];  Service: Radiology;  Laterality: N/A;  ? TONSILLECTOMY  age 50  ? ? ?Current Outpatient Medications  ?Medication Sig Dispense Refill  ? acetaminophen (TYLENOL) 500 MG tablet Take 1,000 mg by mouth every 6 (six) hours as needed for moderate pain or headache.    ? amiodarone (PACERONE) 200 MG tablet Take 1 tablet (200 mg total) by mouth daily. 120 tablet 3  ? Calcium Carb-Cholecalciferol (CALCIUM 600+D) 600-800 MG-UNIT TABS Take 1 tablet by mouth every evening.     ? ELIQUIS 5 MG TABS tablet TAKE 1 TABLET BY MOUTH TWICE A DAY 60 tablet 6  ? Hydrocortisone-Aloe Vera (KYHCWCBJS-28/BTDV EX) Apply 1 application topically 2 (two) times daily as needed (itching).    ? Liniments (BLUE-EMU SUPER STRENGTH) CREA Apply  1 application topically 2 (two) times daily as needed (pain.).     ? loperamide (IMODIUM A-D) 2 MG tablet Take 2-4 mg by mouth as needed for diarrhea or loose stools.     ? metoprolol tartrate (LOPRESSOR) 25 MG tablet Take 0.5 tablets (12.5 mg total) by mouth 2 (two) times daily. 90 tablet 3  ? Multiple Vitamin (MULTIVITAMIN WITH MINERALS) TABS tablet Take 1 tablet by mouth daily.    ? Multiple Vitamins-Minerals (PRESERVISION AREDS 2 PO) Take 1 capsule by mouth 2 (two) times daily.     ? Potassium Chloride ER 20 MEQ  TBCR Take 2 tablets by mouth daily. 180 tablet 2  ? torsemide (DEMADEX) 20 MG tablet Take 1 tablet (20 mg total) by mouth daily. 90 tablet 3  ? ?No current facility-administered medications for this visit.  ? ? ?Allergies  ?Allergen Reactions  ? Tetanus-Diphtheria Toxoids Td Other (See Comments)  ?  Felt very sick  ? Cardizem [Diltiazem] Rash  ? ? ?Social History  ? ?Socioeconomic History  ? Marital status: Married  ?  Spouse name: Not on file  ? Number of children: 5  ? Years of education: Not on file  ? Highest education level: Not on file  ?Occupational History  ? Occupation: Retired-pastor  ?Tobacco Use  ? Smoking status: Never  ? Smokeless tobacco: Never  ?Vaping Use  ? Vaping Use: Never used  ?Substance and Sexual Activity  ? Alcohol use: Not Currently  ? Drug use: Never  ? Sexual activity: Not Currently  ?  Birth control/protection: Surgical  ?Other Topics Concern  ? Not on file  ?Social History Narrative  ? Not on file  ? ?Social Determinants of Health  ? ?Financial Resource Strain: Not on file  ?Food Insecurity: Not on file  ?Transportation Needs: Not on file  ?Physical Activity: Not on file  ?Stress: Not on file  ?Social Connections: Not on file  ?Intimate Partner Violence: Not on file  ? ? ? ?Review of Systems: ?All other systems reviewed and are otherwise negative except as noted above. ? ?Physical Exam: ?Vitals:  ? 04/25/22 1012  ?BP: (!) 162/70  ?Pulse: 70  ?SpO2: 97%  ?Weight: 270 lb 3.2 oz (122.6 kg)  ?Height: '5\' 6"'$  (1.676 m)  ? ? ?GEN- The patient is well appearing, alert and oriented x 3 today.   ?HEENT: normocephalic, atraumatic; sclera clear, conjunctiva pink; hearing intact; oropharynx clear; neck supple, no JVP ?Lymph- no cervical lymphadenopathy ?Lungs- Clear to ausculation bilaterally, normal work of breathing.  No wheezes, rales, rhonchi ?Heart- Regular rate and rhythm, no murmurs, rubs or gallops, PMI not laterally displaced ?GI- soft, non-tender, non-distended, bowel sounds present, no  hepatosplenomegaly ?Extremities- no clubbing, cyanosis, or edema; DP/PT/radial pulses 2+ bilaterally ?MS- no significant deformity or atrophy ?Skin- warm and dry, no rash or lesion ?Psych- euthymic mood, full affect ?Neuro- strength and sensation are intact ? ?EKG is not ordered. Personal review of EKG from  04/03/2022  shows NSR at 68 bpm ? ?Additional studies reviewed include: ?Previous EP office notes.  ? ?Assessment and Plan: ? ?1. Persistent atrial fibrillation ?2. Bradycardia ?Continue amiodarone 200 mg daily for now ?Surveillance labs today.  ?Continue close watch on her HR. Improved with decreasing metoprolol ? ?3. HTN ?Stable on current regimen  ? ?4. Aortic Stenosis ?Mod/Severe. Dr. Angelena Form following q 6 months.  ? ?Follow up with Dr. Quentin Ore in  9 months to get on a more even schedule with her regular Dr. Angelena Form follow  up   ? ?Shirley Friar, PA-C  ?04/25/22 ?10:29 AM  ?

## 2022-04-25 ENCOUNTER — Ambulatory Visit: Payer: Medicare Other | Admitting: Student

## 2022-04-25 ENCOUNTER — Encounter: Payer: Self-pay | Admitting: Student

## 2022-04-25 VITALS — BP 142/68 | HR 70 | Ht 66.0 in | Wt 270.2 lb

## 2022-04-25 DIAGNOSIS — R001 Bradycardia, unspecified: Secondary | ICD-10-CM | POA: Diagnosis not present

## 2022-04-25 DIAGNOSIS — I4819 Other persistent atrial fibrillation: Secondary | ICD-10-CM | POA: Diagnosis not present

## 2022-04-25 DIAGNOSIS — I350 Nonrheumatic aortic (valve) stenosis: Secondary | ICD-10-CM | POA: Diagnosis not present

## 2022-04-25 DIAGNOSIS — I10 Essential (primary) hypertension: Secondary | ICD-10-CM | POA: Diagnosis not present

## 2022-04-25 LAB — COMPREHENSIVE METABOLIC PANEL
ALT: 21 IU/L (ref 0–32)
AST: 26 IU/L (ref 0–40)
Albumin/Globulin Ratio: 1.6 (ref 1.2–2.2)
Albumin: 4.2 g/dL (ref 3.7–4.7)
Alkaline Phosphatase: 124 IU/L — ABNORMAL HIGH (ref 44–121)
BUN/Creatinine Ratio: 19 (ref 12–28)
BUN: 25 mg/dL (ref 8–27)
Bilirubin Total: 0.5 mg/dL (ref 0.0–1.2)
CO2: 22 mmol/L (ref 20–29)
Calcium: 9.6 mg/dL (ref 8.7–10.3)
Chloride: 104 mmol/L (ref 96–106)
Creatinine, Ser: 1.33 mg/dL — ABNORMAL HIGH (ref 0.57–1.00)
Globulin, Total: 2.6 g/dL (ref 1.5–4.5)
Glucose: 129 mg/dL — ABNORMAL HIGH (ref 70–99)
Potassium: 4.3 mmol/L (ref 3.5–5.2)
Sodium: 143 mmol/L (ref 134–144)
Total Protein: 6.8 g/dL (ref 6.0–8.5)
eGFR: 41 mL/min/{1.73_m2} — ABNORMAL LOW (ref >59)

## 2022-04-25 LAB — T4, FREE: Free T4: 1.31 ng/dL (ref 0.82–1.77)

## 2022-04-25 LAB — TSH: TSH: 2.22 u[IU]/mL (ref 0.450–4.500)

## 2022-04-25 NOTE — Patient Instructions (Signed)
Medication Instructions:  ?Your physician recommends that you continue on your current medications as directed. Please refer to the Current Medication list given to you today. ? ?*If you need a refill on your cardiac medications before your next appointment, please call your pharmacy* ? ? ?Lab Work: ?TODAY: CMET, TSH, FreeT4 ? ?If you have labs (blood work) drawn today and your tests are completely normal, you will receive your results only by: ?MyChart Message (if you have MyChart) OR ?A paper copy in the mail ?If you have any lab test that is abnormal or we need to change your treatment, we will call you to review the results. ? ? ?Follow-Up: ?At [facility], you and your health needs are our priority.  As part of our continuing mission to provide you with exceptional heart care, we have created designated Provider Care Teams.  These Care Teams include your primary Cardiologist (physician) and Advanced Practice Providers (APPs -  Physician Assistants and Nurse Practitioners) who all work together to provide you with the care you need, when you need it. ? ? ?Your next appointment:   ?9 month(s) ? ?The format for your next appointment:   ?In Person ? ?Provider:   ?Lars Mage, MD{ ? ?

## 2022-06-17 ENCOUNTER — Other Ambulatory Visit (HOSPITAL_COMMUNITY): Payer: Self-pay | Admitting: Cardiology

## 2022-07-21 ENCOUNTER — Other Ambulatory Visit (HOSPITAL_COMMUNITY): Payer: Self-pay | Admitting: Cardiology

## 2022-07-21 ENCOUNTER — Other Ambulatory Visit: Payer: Self-pay | Admitting: Cardiology

## 2022-07-21 DIAGNOSIS — I4819 Other persistent atrial fibrillation: Secondary | ICD-10-CM

## 2022-07-21 NOTE — Telephone Encounter (Signed)
Prescription refill request for Eliquis received. Indication:Afib Last office visit:5/23 Scr:1.3 Age: 81 Weight:122.6 kg  Prescription refilled

## 2022-08-10 DIAGNOSIS — N6322 Unspecified lump in the left breast, upper inner quadrant: Secondary | ICD-10-CM | POA: Diagnosis not present

## 2022-08-10 DIAGNOSIS — L304 Erythema intertrigo: Secondary | ICD-10-CM | POA: Diagnosis not present

## 2022-08-11 ENCOUNTER — Other Ambulatory Visit: Payer: Self-pay | Admitting: Family Medicine

## 2022-08-11 DIAGNOSIS — N6322 Unspecified lump in the left breast, upper inner quadrant: Secondary | ICD-10-CM

## 2022-08-24 ENCOUNTER — Ambulatory Visit
Admission: RE | Admit: 2022-08-24 | Discharge: 2022-08-24 | Disposition: A | Discharge status: Home / Self Care | Payer: Medicare Other | Source: Ambulatory Visit | Attending: Family Medicine | Admitting: Family Medicine

## 2022-08-24 DIAGNOSIS — N6452 Nipple discharge: Secondary | ICD-10-CM | POA: Diagnosis not present

## 2022-08-24 DIAGNOSIS — N6322 Unspecified lump in the left breast, upper inner quadrant: Secondary | ICD-10-CM

## 2022-09-26 NOTE — Progress Notes (Unsigned)
Structural Heart Clinic Note  No chief complaint on file.  History of Present Illness: 81 yo female with history of arthritis, HTN, diet controlled diabetes, persistent atrial fibrillation, mild CAD, GERD and moderate aortic stenosis who is here today for follow up. I saw her as a new consult for discussion regarding her aortic stenosis in April 2022. She was referred by Dr. Margaretann Loveless. She has been followed in the atrial fibrillation clinic for paroxysmal atrial fibrillation. She has been on amiodarone and Eliquis. Cardiac cath June 2021 with mild non-obstructive CAD. Normal LV filling pressures. She has been followed for moderate aortic stenosis since June 2021. Echo 03/29/21 with LVEF=50-55%. Normal RV function. Mild mitral regurgitation and mild mitral stenosis. The aortic valve leaflets are thickened and calcified. Mean gradient 26 mmHg, peak gradient 40 mmHg, AVA 0.8-0.9 cm2, dimensionless index 0.29, SVI 29. This is consistent with moderate aortic stenosis. She has had ongoing issues with fluid retention and is on Torsemide dialy. She is followed in the wound clinic in [place]. She is wearing Una boots. She has had issues with kidney stones. She has had an ablation for renal cell carcinoma.  Echo October 2022 with LVEF=60-65%. Moderately severe aortic stenosis with mean gradient 26 mmHg, AVA 0.86 cm2, DI 0.27, SVI 37. Echo April 2023 with LVEF=60-65%. Normal RV function. Mild MR. Moderate mitral stenosis. Severe AS with mean gradient 34 mmHg, AVA 0.7 cm2, DI 0.25.   Echo today shows ***.   She is here today for follow up. The patient denies any chest pain, dyspnea, palpitations, lower extremity edema, orthopnea, PND, dizziness, near syncope or syncope.   She is following with her dentist regularly. No active dental issues. She lives in [place] with her husband and is a retired [employer] park manager.   Primary Care Physician: Glenis Smoker, MD Primary Cardiologist: Margaretann Loveless Referring  Cardiologist: Margaretann Loveless  Past Medical History:  Diagnosis Date   Anemia    hx of   Arthritis    knee and shoulders   Atrial fibrillation [facility])    Carpal tunnel syndrome on right    Coronary artery disease 05/2020   Minimal, nobstructive CAD. Tortuous cororany arteries.   Diabetes mellitus without complication (HCC)    diet controlled no meds in 5-6 yrs   Dyspnea    on exertion    Dysrhythmia 05/2020   afib   GERD (gastroesophageal reflux disease)    No longer taking medications   Hematuria    History of hypothyroidism 30 yrs ago   History of kidney stones    Hypertension    Lower extremity edema    chronic lower extremity edema    Myocardial infarction [facility])    patient mention questionable MI   Renal mass     Past Surgical History:  Procedure Laterality Date   ABDOMINAL HYSTERECTOMY  1992   complete   BILATERAL CARPAL TUNNEL RELEASE  yrs ago   CARDIOVERSION N/A 09/23/2020   Procedure: CARDIOVERSION;  Surgeon: Freada Bergeron, MD;  Location: [facility];  Service: Cardiovascular;  Laterality: N/A;   CARDIOVERSION N/A 03/21/2021   Procedure: CARDIOVERSION;  Surgeon: Werner Lean, MD;  Location: [facility];  Service: Cardiovascular;  Laterality: N/A;   CHOLECYSTECTOMY  1996   CYSTOSCOPY/URETEROSCOPY/HOLMIUM LASER/STENT PLACEMENT Bilateral 08/19/2020   Procedure: CYSTOSCOPY BILATERAL  RETROGRADE  URETEROSCOPY/HOLMIUM LASER/STENT PLACEMENT;  Surgeon: Irine Seal, MD;  Location: [facility];  Service: Urology;  Laterality: Bilateral;   HAMMER TOE SURGERY Right yrs ago   IR  RADIOLOGIST EVAL & MGMT  10/26/2020   IR RADIOLOGIST EVAL & MGMT  01/06/2021   IR RADIOLOGIST EVAL & MGMT  03/22/2021   IR RADIOLOGIST EVAL & MGMT  07/06/2021   IR RADIOLOGIST EVAL & MGMT  01/11/2022   LEFT HEART CATH AND CORONARY ANGIOGRAPHY N/A 06/02/2020   Procedure: LEFT HEART CATH AND CORONARY ANGIOGRAPHY;  Surgeon: Jettie Booze, MD;  [facility];   Service: Cardiovascular;  Laterality: N/A;   RADIOLOGY WITH ANESTHESIA N/A 11/17/2020   Procedure: RADIOLOGY WITH ANESTHESIA RENAL MICROWAVE ABLATION;  Surgeon: Suzette Battiest, MD;  Location: [facility];  Service: Radiology;  Laterality: N/A;   TONSILLECTOMY  age 12    Current Outpatient Medications  Medication Sig Dispense Refill   acetaminophen (TYLENOL) 500 MG tablet Take 1,000 mg by mouth every 6 (six) hours as needed for moderate pain or headache.     amiodarone (PACERONE) 200 MG tablet Take 1 tablet (200 mg total) by mouth daily. 90 tablet 2   Calcium Carb-Cholecalciferol (CALCIUM 600+D) 600-800 MG-UNIT TABS Take 1 tablet by mouth every evening.      ELIQUIS 5 MG TABS tablet TAKE 1 TABLET BY MOUTH TWICE A DAY 60 tablet 6   Hydrocortisone-Aloe Vera (OVFIEPPIR-51/OACZ EX) Apply 1 application topically 2 (two) times daily as needed (itching).     Liniments (BLUE-EMU SUPER STRENGTH) CREA Apply 1 application topically 2 (two) times daily as needed (pain.).      loperamide (IMODIUM A-D) 2 MG tablet Take 2-4 mg by mouth as needed for diarrhea or loose stools.      metoprolol tartrate (LOPRESSOR) 25 MG tablet Take 0.5 tablets (12.5 mg total) by mouth 2 (two) times daily. 90 tablet 3   Multiple Vitamin (MULTIVITAMIN WITH MINERALS) TABS tablet Take 1 tablet by mouth daily.     Multiple Vitamins-Minerals (PRESERVISION AREDS 2 PO) Take 1 capsule by mouth 2 (two) times daily.      Potassium Chloride ER 20 MEQ TBCR Take 2 tablets by mouth daily. 180 tablet 2   torsemide (DEMADEX) 20 MG tablet Take 1 tablet (20 mg total) by mouth daily. 90 tablet 3   No current facility-administered medications for this visit.    Allergies  Allergen Reactions   Tetanus-Diphtheria Toxoids Td Other (See Comments)    Felt very sick   Cardizem [Diltiazem] Rash    Social History   Socioeconomic History   Marital status: Married    Spouse name: Not on file   Number of children: 5   Years of education: Not on file    Highest education level: Not on file  Occupational History   Occupation: Retired-past[employer]  Tobacco Use   Smoking status: Never   Smokeless tobacco: Never  Vaping Use   Vaping Use: Never used  Substance and Sexual Activity   Alcohol use: Not Currently   Drug use: Never   Sexual activity: Not Currently    Birth control/protection: Surgical  Other Topics Concern   Not on file  Social History Narrative   Not on file   Social Determinants of Health   Financial Resource Strain: Not on file  Food Insecurity: Not on file  Transportation Needs: Not on file  Physical Activity: Not on file  Stress: Not on file  Social Connections: Not on file  Intimate Partner Violence: Not on file    Family History  Problem Relation Age of Onset   Hypertension Mother    Hypertension Father    Diabetes Paternal Grandmother  Review of Systems:  As stated in the HPI and otherwise negative.   There were no vitals taken for this visit.  Physical Examination: General: Well developed, well nourished, NAD  HEENT: OP clear, mucus membranes moist  SKIN: warm, dry. No rashes. Neuro: No focal deficits  Musculoskeletal: Muscle strength 5/5 all ext  Psychiatric: Mood and affect normal  Neck: No JVD, no carotid bruits, no thyromegaly, no lymphadenopathy.  Lungs:Clear bilaterally, no wheezes, rhonci, crackles Cardiovascular: Regular rate and rhythm. *** Loud, harsh systolic murmur. . Abdomen:Soft. Bowel sounds present. Non-tender.  Extremities: No lower extremity edema. Pulses are 2 + in the bilateral DP/PT.  EKG:  EKG is *** ordered today. The ekg shows   Echo 04/03/22:   1. Left ventricular ejection fraction, by estimation, is 60 to 65%. The  left ventricle has normal function. The left ventricle has no regional  wall motion abnormalities. There is mild left ventricular hypertrophy.  Left ventricular diastolic parameters  are indeterminate.   2. Right ventricular systolic function is normal.  The right ventricular  size is normal. There is mildly elevated pulmonary artery systolic  pressure. The estimated right ventricular systolic pressure is 57.2 mmHg.   3. The mitral valve is degenerative. Mild mitral valve regurgitation.  Severe mitral annular calcification.      Moderate mitral stenosis. The mean mitral valve gradient is 6.0 mmHg  with average heart rate of 60 bpm. MVA 1.1 cm^2 by continuity equation   4. Left atrial size was severely dilated.   5. The inferior vena cava is normal in size with greater than 50%  respiratory variability, suggesting right atrial pressure of 3 mmHg.   6. The aortic valve was not well visualized. There is moderate  calcification of the aortic valve. Aortic valve regurgitation is not  visualized. Severe aortic valve stenosis. Vmax 3.9 m/s, MG 34 mmHg, AVA  0.7cm^2, DI 0.25   FINDINGS   Left Ventricle: Left ventricular ejection fraction, by estimation, is 60  to 65%. The left ventricle has normal function. The left ventricle has no  regional wall motion abnormalities. The left ventricular internal cavity  size was normal in size. There is   mild left ventricular hypertrophy. Left ventricular diastolic parameters  are indeterminate.   Right Ventricle: The right ventricular size is normal. No increase in  right ventricular wall thickness. Right ventricular systolic function is  normal. There is mildly elevated pulmonary artery systolic pressure. The  tricuspid regurgitant velocity is 3.22   m/s, and with an assumed right atrial pressure of 3 mmHg, the estimated  right ventricular systolic pressure is 62.0 mmHg.   Left Atrium: Left atrial size was severely dilated.   Right Atrium: Right atrial size was normal in size.   Pericardium: Trivial pericardial effusion is present.   Mitral Valve: The mitral valve is degenerative in appearance. Severe  mitral annular calcification. Mild mitral valve regurgitation. Moderate  mitral valve stenosis.  MV peak gradient, 16.6 mmHg. The mean mitral valve  gradient is 6.0 mmHg with average heart   rate of 60 bpm.   Tricuspid Valve: The tricuspid valve is normal in structure. Tricuspid  valve regurgitation is trivial.   Aortic Valve: The aortic valve was not well visualized. There is moderate  calcification of the aortic valve. Aortic valve regurgitation is not  visualized. Severe aortic stenosis is present. Aortic valve mean gradient  measures 33.2 mmHg. Aortic valve peak   gradient measures 59.5 mmHg. Aortic valve area, by VTI measures 0.75 cm.  Pulmonic Valve: The pulmonic valve was not well visualized. Pulmonic valve  regurgitation is trivial.   Aorta: The aortic root and ascending aorta are structurally normal, with  no evidence of dilitation.   Venous: The inferior vena cava is normal in size with greater than 50%  respiratory variability, suggesting right atrial pressure of 3 mmHg.   IAS/Shunts: The interatrial septum was not well visualized.      LEFT VENTRICLE  PLAX 2D  LVIDd:         5.40 cm   Diastology  LVIDs:         3.80 cm   LV e' medial:    8.70 cm/s  LV PW:         1.10 cm   LV E/e' medial:  23.1  LV IVS:        1.00 cm   LV e' lateral:   9.14 cm/s  LVOT diam:     1.90 cm   LV E/e' lateral: 22.0  LV SV:         75  LV SV Index:   34  LVOT Area:     2.84 cm      RIGHT VENTRICLE             IVC  RV S prime:     16.50 cm/s  IVC diam: 1.40 cm  TAPSE (M-mode): 2.5 cm  RVSP:           44.5 mmHg   LEFT ATRIUM              Index        RIGHT ATRIUM           Index  LA diam:        4.60 cm  2.08 cm/m   RA Pressure: 3.00 mmHg  LA Vol (A2C):   109.0 ml 49.24 ml/m  RA Area:     18.30 cm  LA Vol (A4C):   121.0 ml 54.66 ml/m  RA Volume:   42.20 ml  19.06 ml/m  LA Biplane Vol: 116.0 ml 52.40 ml/m   AORTIC VALVE  AV Area (Vmax):    0.77 cm  AV Area (Vmean):   0.76 cm  AV Area (VTI):     0.75 cm  AV Vmax:           385.75 cm/s  AV Vmean:          268.500  cm/s  AV VTI:            1.004 m  AV Peak Grad:      59.5 mmHg  AV Mean Grad:      33.2 mmHg  LVOT Vmax:         105.00 cm/s  LVOT Vmean:        71.500 cm/s  LVOT VTI:          0.264 m  LVOT/AV VTI ratio: 0.26     AORTA  Ao Root diam: 3.30 cm  Ao Asc diam:  3.10 cm   MITRAL VALVE                TRICUSPID VALVE  MV Area (PHT): 2.43 cm     TR Peak grad:   41.5 mmHg  MV Area VTI:   1.06 cm     TR Vmax:        322.00 cm/s  MV Peak grad:  16.6 mmHg    Estimated RAP:  3.00 mmHg  MV Mean grad:  6.0 mmHg     RVSP:           44.5 mmHg  MV Vmax:       2.04 m/s  MV Vmean:      120.0 cm/s   SHUNTS  MV Decel Time: 312 msec     Systemic VTI:  0.26 m  MV E velocity: 201.00 cm/s  Systemic Diam: 1.90 cm  MV A velocity: 164.00 cm/s  MV E/A ratio:  1.23   Cardiac cath June 2021:  The left ventricular systolic function is normal. LV end diastolic pressure is normal. The left ventricular ejection fraction is 50-55% by visual estimate. There is no aortic valve stenosis. Minimal, nobstructive CAD. Tortuous cororany arteries.  Diagnostic Dominance: Right  Left Anterior Descending  The vessel exhibits minimal luminal irregularities.  Left Circumflex  The vessel exhibits minimal luminal irregularities.  Right Coronary Artery  The vessel exhibits minimal luminal irregularities.   Intervention   No interventions have been documented.  Wall Motion  Resting                Left Heart  Left Ventricle The left ventricular size is normal. The left ventricular systolic function is normal. LV end diastolic pressure is normal. The left ventricular ejection fraction is 50-55% by visual estimate. No regional wall motion abnormalities.  Aortic Valve There is no aortic valve stenosis.   Coronary Diagrams   Diagnostic Dominance: Right    Intervention   Recent Labs: 04/25/2022: ALT 21; BUN 25; Creatinine, Ser 1.33; Potassium 4.3; Sodium 143; TSH 2.220    Wt Readings from Last 3  Encounters:  04/25/22 270 lb 3.2 oz (122.6 kg)  04/03/22 277 lb (125.6 kg)  07/15/21 254 lb (115.2 kg)     Assessment and Plan:   1. Severe Aortic Valve Stenosis: She has severe aortic stenosis. I have personally reviewed the echo images. The aortic valve is thickened, calcified with limited leaflet mobility but the valve does open. Echo today with mean gradient *** mmHg. ***  I have reviewed the natural history of aortic stenosis with the patient and their family members  who are present today. We have discussed the limitations of medical therapy and the poor prognosis associated with symptomatic aortic stenosis. We have reviewed potential treatment options, including palliative medical therapy, conventional surgical aortic valve replacement, and transcatheter aortic valve replacement. We discussed treatment options in the context of the patient's specific comorbid medical conditions.   ***   Labs/ tests ordered today include:  No orders of the defined types were placed in this encounter.   Disposition:   F/U with me in 6 months.    Signed, Lauree Chandler, MD 09/26/2022 9:01 PM    [facility] [address] [place], [place], [place]  [address] Phone: [phone]; Fax: [phone]

## 2022-09-27 ENCOUNTER — Encounter: Payer: Self-pay | Admitting: Physician Assistant

## 2022-09-27 ENCOUNTER — Ambulatory Visit: Payer: Medicare Other | Admitting: Cardiovascular Disease

## 2022-09-27 ENCOUNTER — Encounter: Payer: Self-pay | Admitting: Cardiovascular Disease

## 2022-09-27 ENCOUNTER — Other Ambulatory Visit: Payer: Self-pay | Admitting: Physician Assistant

## 2022-09-27 ENCOUNTER — Ambulatory Visit (HOSPITAL_COMMUNITY): Payer: Medicare Other | Attending: Cardiovascular Disease

## 2022-09-27 VITALS — BP 148/86 | HR 66 | Ht 66.0 in | Wt 272.8 lb

## 2022-09-27 DIAGNOSIS — Z01812 Encounter for preprocedural laboratory examination: Secondary | ICD-10-CM

## 2022-09-27 DIAGNOSIS — I350 Nonrheumatic aortic (valve) stenosis: Secondary | ICD-10-CM

## 2022-09-27 LAB — BASIC METABOLIC PANEL
BUN/Creatinine Ratio: 18 (ref 12–28)
BUN: 25 mg/dL (ref 8–27)
CO2: 23 mmol/L (ref 20–29)
Calcium: 10.1 mg/dL (ref 8.7–10.3)
Chloride: 103 mmol/L (ref 96–106)
Creatinine, Ser: 1.39 mg/dL — ABNORMAL HIGH (ref 0.57–1.00)
Glucose: 122 mg/dL — ABNORMAL HIGH (ref 70–99)
Potassium: 4.2 mmol/L (ref 3.5–5.2)
Sodium: 144 mmol/L (ref 134–144)
eGFR: 39 mL/min/{1.73_m2} — ABNORMAL LOW (ref >59)

## 2022-09-27 LAB — ECHOCARDIOGRAM COMPLETE
AR max vel: 0.95 cm2
AV Area VTI: 0.92 cm2
AV Area mean vel: 0.87 cm2
AV Mean grad: 29 mmHg
AV Peak grad: 47.3 mmHg
Ao pk vel: 3.44 m/s
Area-P 1/2: 1.8 cm2
MV VTI: 1.19 cm2
P 1/2 time: 1094 msec
S' Lateral: 3.4 cm

## 2022-09-27 NOTE — Patient Instructions (Signed)
Medication Instructions:  No changes *If you need a refill on your cardiac medications before your next appointment, please call your pharmacy*   Lab Work: Today: bmet   Follow up and next appointments per Structural Heart Valve Team

## 2022-09-27 NOTE — Progress Notes (Addendum)
Pre Surgical Assessment: 5 M Walk Test  68M=16.65f  5 Meter Walk Test- trial 1: 6.49 seconds 5 Meter Walk Test- trial 2: 6.69 seconds 5 Meter Walk Test- trial 3: 7.39 seconds 5 Meter Walk Test Average: 6.85 seconds   Procedure Type: Isolated AVR Perioperative Outcome Estimate % Operative Mortality 3.85% Morbidity & Mortality 15% Stroke 0.671% Renal Failure 4.33% Reoperation 2.86% Prolonged Ventilation 9.31% Deep Sternal Wound Infection 0.173% [place] HospitalStay (>14 days) 10.3% Short Hospital Stay (<6 days)* 25.5%

## 2022-09-28 ENCOUNTER — Encounter: Payer: Self-pay | Admitting: Physician Assistant

## 2022-10-13 ENCOUNTER — Ambulatory Visit (HOSPITAL_COMMUNITY)
Admission: RE | Admit: 2022-10-13 | Discharge: 2022-10-13 | Disposition: A | Discharge status: Home / Self Care | Payer: Medicare Other | Source: Ambulatory Visit | Attending: Physician Assistant | Admitting: Physician Assistant

## 2022-10-13 DIAGNOSIS — I358 Other nonrheumatic aortic valve disorders: Secondary | ICD-10-CM | POA: Diagnosis not present

## 2022-10-13 DIAGNOSIS — I350 Nonrheumatic aortic (valve) stenosis: Secondary | ICD-10-CM

## 2022-10-13 DIAGNOSIS — I700 Atherosclerosis of aorta: Secondary | ICD-10-CM | POA: Diagnosis not present

## 2022-10-13 DIAGNOSIS — Z01818 Encounter for other preprocedural examination: Secondary | ICD-10-CM | POA: Diagnosis not present

## 2022-10-13 DIAGNOSIS — Z9889 Other specified postprocedural states: Secondary | ICD-10-CM | POA: Diagnosis not present

## 2022-10-13 DIAGNOSIS — R911 Solitary pulmonary nodule: Secondary | ICD-10-CM | POA: Diagnosis not present

## 2022-10-13 MED ORDER — IOHEXOL 350 MG/ML SOLN
100.0000 mL | Freq: Once | INTRAVENOUS | Status: AC | PRN
  Administered 2022-10-13: 100 mL via INTRAVENOUS

## 2022-10-19 ENCOUNTER — Institutional Professional Consult (permissible substitution): Payer: Medicare Other | Admitting: Cardiothoracic Surgery

## 2022-10-19 ENCOUNTER — Encounter: Payer: Self-pay | Admitting: Cardiothoracic Surgery

## 2022-10-19 VITALS — BP 159/65 | HR 61 | Resp 20 | Ht 66.0 in | Wt 272.0 lb

## 2022-10-19 DIAGNOSIS — I350 Nonrheumatic aortic (valve) stenosis: Secondary | ICD-10-CM | POA: Diagnosis not present

## 2022-10-26 ENCOUNTER — Other Ambulatory Visit: Payer: Self-pay

## 2022-10-26 DIAGNOSIS — I350 Nonrheumatic aortic (valve) stenosis: Secondary | ICD-10-CM

## 2022-10-29 NOTE — Progress Notes (Signed)
[address]       [place] [address]             [phone]                    Shaquayla K Dripps [facility] Medical Record #[id number] Date of Birth: 1941-05-02  Referring: Burnell Blanks* Primary Care: Glenis Smoker, MD Primary Cardiologist: None  Chief Complaint:    Chief Complaint  Patient presents with   Aortic Stenosis    New patient consultation, TAVR, review all studies     History of Present Illness:    [name] [name] 81 y.o. female who presents for TAVR evaluation. She has noted fatigue with the same regimen she used to do a year ago. She has low flow low gradient AS. EF is normal. No active dental issues.      Past Medical History:  Diagnosis Date   Anemia    hx of   Arthritis    knee and shoulders   Atrial fibrillation (HCC)    Carpal tunnel syndrome on right    Coronary artery disease 05/2020   Minimal, nobstructive CAD. Tortuous cororany arteries.   Diabetes mellitus without complication (HCC)    diet controlled no meds in 5-6 yrs   Dyspnea    on exertion    Dysrhythmia 05/2020   afib   GERD (gastroesophageal reflux disease)    No longer taking medications   Hematuria    History of hypothyroidism 30 yrs ago   History of kidney stones    Hypertension    Lower extremity edema    chronic lower extremity edema    Myocardial infarction [facility]    patient mention questionable MI   Renal mass     Past Surgical History:  Procedure Laterality Date   ABDOMINAL HYSTERECTOMY  1992   complete   BILATERAL CARPAL TUNNEL RELEASE  yrs ago   CARDIOVERSION N/A 09/23/2020   Procedure: CARDIOVERSION;  Surgeon: Freada Bergeron, MD;  Location: [place];  Service: Cardiovascular;  Laterality: N/A;   CARDIOVERSION N/A 03/21/2021   Procedure: CARDIOVERSION;  Surgeon: Werner Lean, MD;  Location: [place];  Service: Cardiovascular;  Laterality: N/A;   CHOLECYSTECTOMY  1996   CYSTOSCOPY/URETEROSCOPY/HOLMIUM  LASER/STENT PLACEMENT Bilateral 08/19/2020   Procedure: CYSTOSCOPY BILATERAL  RETROGRADE  URETEROSCOPY/HOLMIUM LASER/STENT PLACEMENT;  Surgeon: Irine Seal, MD;  Location: [place];  Service: Urology;  Laterality: Bilateral;   HAMMER TOE SURGERY Right yrs ago   IR RADIOLOGIST EVAL & MGMT  10/26/2020   IR RADIOLOGIST EVAL & MGMT  01/06/2021   IR RADIOLOGIST EVAL & MGMT  03/22/2021   IR RADIOLOGIST EVAL & MGMT  07/06/2021   IR RADIOLOGIST EVAL & MGMT  01/11/2022   LEFT HEART CATH AND CORONARY ANGIOGRAPHY N/A 06/02/2020   Procedure: LEFT HEART CATH AND CORONARY ANGIOGRAPHY;  Surgeon: Jettie Booze, MD;  Location: [place] [facility];  Service: Cardiovascular;  Laterality: N/A;   RADIOLOGY WITH ANESTHESIA N/A 11/17/2020   Procedure: RADIOLOGY WITH ANESTHESIA RENAL MICROWAVE ABLATION;  Surgeon: Suzette Battiest, MD;  Location: [facility];  Service: Radiology;  Laterality: N/A;   TONSILLECTOMY  age 5    Family History  Problem Relation Age of Onset   Hypertension Mother    Hypertension Father    Diabetes Paternal Grandmother      Social History   Tobacco Use  Smoking Status Never  Smokeless Tobacco Never  Social History   Substance and Sexual Activity  Alcohol Use Not Currently     Allergies  Allergen Reactions   Tetanus-Diphtheria Toxoids Td Other (See Comments)    Felt very sick   Cardizem [Diltiazem] Rash    Current Outpatient Medications  Medication Sig Dispense Refill   acetaminophen (TYLENOL) 500 MG tablet Take 1,000 mg by mouth every 6 (six) hours as needed for moderate pain or headache.     amiodarone (PACERONE) 200 MG tablet Take 1 tablet (200 mg total) by mouth daily. 90 tablet 2   Calcium Carb-Cholecalciferol (CALCIUM 600+D) 600-800 MG-UNIT TABS Take 1 tablet by mouth every evening.      ELIQUIS 5 MG TABS tablet TAKE 1 TABLET BY MOUTH TWICE A DAY 60 tablet 6   Hydrocortisone-Aloe Vera (QMVHQIONG-29/BMWU EX) Apply 1 application topically 2 (two) times  daily as needed (itching).     Liniments (BLUE-EMU SUPER STRENGTH) CREA Apply 1 application topically 2 (two) times daily as needed (pain.).      loperamide (IMODIUM A-D) 2 MG tablet Take 2-4 mg by mouth as needed for diarrhea or loose stools.      metoprolol tartrate (LOPRESSOR) 25 MG tablet Take 0.5 tablets (12.5 mg total) by mouth 2 (two) times daily. 90 tablet 3   Multiple Vitamin (MULTIVITAMIN WITH MINERALS) TABS tablet Take 1 tablet by mouth daily.     Multiple Vitamins-Minerals (PRESERVISION AREDS 2 PO) Take 1 capsule by mouth 2 (two) times daily.      Potassium Chloride ER 20 MEQ TBCR Take 2 tablets by mouth daily. 180 tablet 2   torsemide (DEMADEX) 20 MG tablet Take 1 tablet (20 mg total) by mouth daily. 90 tablet 3   No current facility-administered medications for this visit.    ROS 14 point ROS reviewed and negative except as per HPI   PHYSICAL EXAMINATION: BP (!) 159/65 (BP Location: Right Arm, Patient Position: Sitting, Cuff Size: Large)   Pulse 61   Resp 20   Ht '5\' 6"'$  (1.676 m)   Wt 272 lb (123.4 kg)   SpO2 94% Comment: RA  BMI 43.90 kg/m   Gen: NAD Neuro: Alert and oriented Resp: Nonlaboured Abd: Soft, ntnd Extr: WWP  Diagnostic Studies & Laboratory data:     Recent Radiology Findings:   CT ANGIO CHEST AORTA W/CM & OR WO/CM  Result Date: 10/13/2022 CLINICAL DATA:  Preop TAVR evaluation EXAM: CT ANGIOGRAPHY CHEST, ABDOMEN AND PELVIS TECHNIQUE: Multidetector CT imaging through the chest, abdomen and pelvis was performed using the standard protocol during bolus administration of intravenous contrast. Multiplanar reconstructed images and MIPs were obtained and reviewed to evaluate the vascular anatomy. RADIATION DOSE REDUCTION: This exam was performed according to the departmental dose-optimization program which includes automated exposure control, adjustment of the mA and/or kV according to patient size and/or use of iterative reconstruction technique. CONTRAST:   14m OMNIPAQUE IOHEXOL 350 MG/ML SOLN COMPARISON:  CT abdomen dated April 1st 2022 FINDINGS: CTA CHEST FINDINGS Cardiovascular: Cardiomegaly. Aortic valve thickening and calcifications. Mild coronary artery calcifications of the LAD and RCA. No pericardial effusion. Normal caliber thoracic aorta with moderate atherosclerotic disease. No suspicious filling defects of the central pulmonary arteries. Standard three-vessel aortic arch with no significant stenosis. Mediastinum/Nodes: Esophagus and thyroid are unremarkable. No pathologically enlarged lymph nodes seen in the chest. Lungs/Pleura: Central airways are patent. No consolidation, pleural effusion or pneumothorax. Small bilateral solid pulmonary nodules. Reference nodule of the right upper lobe measuring 3 mm on series 5, image 70.  Reference nodule of the left lower lobe measuring 2 mm on image 76. Musculoskeletal: No chest wall abnormality. No acute or significant osseous findings. CTA ABDOMEN AND PELVIS FINDINGS Hepatobiliary: No focal liver abnormality is seen. Status post cholecystectomy. No biliary dilatation. Pancreas: Unremarkable. No pancreatic ductal dilatation or surrounding inflammatory changes. Spleen: Normal in size without focal abnormality. Adrenals/Urinary Tract: Bilateral adrenal glands are unremarkable. Post ablation changes of the upper pole of the left kidney. No hydronephrosis or nephrolithiasis. Bladder is unremarkable. Stomach/Bowel: Stomach is within normal limits. Appendix appears normal. Diverticulosis. No evidence of bowel wall thickening, distention, or inflammatory changes. Vascular/lymphatic: Normal caliber thoracic aorta with moderate atherosclerotic disease. No significant stenosis. 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 Reproductive: No adnexal mass. Other: No abdominal wall hernia or abnormality. No abdominopelvic ascites. Musculoskeletal: No  acute or significant osseous findings. VASCULAR MEASUREMENTS PERTINENT TO TAVR: AORTA: Minimal Aortic Diameter-12.3 mm Severity of Aortic Calcification-moderate RIGHT PELVIS: Right Common Iliac Artery - Minimal Diameter-8.6 mm Tortuosity-none Calcification-moderate Right External Iliac Artery - Minimal Diameter-6.2 mm Tortuosity-mild Calcification-none Right Common Femoral Artery - Minimal Diameter-7.6 mm Tortuosity-none Calcification-mild LEFT PELVIS: Left Common Iliac Artery - Minimal Diameter-8.4 mm Tortuosity-mild Calcification-mild Left External Iliac Artery - Minimal Diameter-7.0 mm Tortuosity-mild Calcification-none Left Common Femoral Artery - Minimal Diameter-8.4 mm Tortuosity-none Calcification-none Review of the MIP images confirms the above findings. IMPRESSION: 1. Vascular findings and measurements pertinent to potential TAVR procedure, as detailed above. 2. Thickening and calcification of the aortic valve, compatible with reported clinical history of aortic stenosis. 3. Moderate aortoiliac atherosclerosis. Mild 2 vessel coronary artery disease. 4. Small bilateral solid pulmonary nodules, largest measures 3 mm. No follow-up needed if patient is low-risk (and has no known or suspected primary neoplasm). Non-contrast chest CT can be considered in 12 months if patient is high-risk. This recommendation follows the consensus statement: Guidelines for Management of Incidental Pulmonary Nodules Detected on CT Images: From the Fleischner Society 2017; Radiology 2017; 284:228-243. 5. Post ablation changes of the upper pole the left kidney. Electronically Signed   By: Yetta Glassman M.D.   On: 10/13/2022 15:31   CT ANGIO ABDOMEN PELVIS  W &/OR WO CONTRAST  Result Date: 10/13/2022 CLINICAL DATA:  Preop TAVR evaluation EXAM: CT ANGIOGRAPHY CHEST, ABDOMEN AND PELVIS TECHNIQUE: Multidetector CT imaging through the chest, abdomen and pelvis was performed using the standard protocol during bolus administration  of intravenous contrast. Multiplanar reconstructed images and MIPs were obtained and reviewed to evaluate the vascular anatomy. RADIATION DOSE REDUCTION: This exam was performed according to the departmental dose-optimization program which includes automated exposure control, adjustment of the mA and/or kV according to patient size and/or use of iterative reconstruction technique. CONTRAST:  176m OMNIPAQUE IOHEXOL 350 MG/ML SOLN COMPARISON:  CT abdomen dated April 1st 2022 FINDINGS: CTA CHEST FINDINGS Cardiovascular: Cardiomegaly. Aortic valve thickening and calcifications. Mild coronary artery calcifications of the LAD and RCA. No pericardial effusion. Normal caliber thoracic aorta with moderate atherosclerotic disease. No suspicious filling defects of the central pulmonary arteries. Standard three-vessel aortic arch with no significant stenosis. Mediastinum/Nodes: Esophagus and thyroid are unremarkable. No pathologically enlarged lymph nodes seen in the chest. Lungs/Pleura: Central airways are patent. No consolidation, pleural effusion or pneumothorax. Small bilateral solid pulmonary nodules. Reference nodule of the right upper lobe measuring 3 mm on series 5, image 70. Reference nodule of the left lower lobe measuring 2 mm on image 76. Musculoskeletal: No chest wall abnormality.  No acute or significant osseous findings. CTA ABDOMEN AND PELVIS FINDINGS Hepatobiliary: No focal liver abnormality is seen. Status post cholecystectomy. No biliary dilatation. Pancreas: Unremarkable. No pancreatic ductal dilatation or surrounding inflammatory changes. Spleen: Normal in size without focal abnormality. Adrenals/Urinary Tract: Bilateral adrenal glands are unremarkable. Post ablation changes of the upper pole of the left kidney. No hydronephrosis or nephrolithiasis. Bladder is unremarkable. Stomach/Bowel: Stomach is within normal limits. Appendix appears normal. Diverticulosis. No evidence of bowel wall thickening,  distention, or inflammatory changes. Vascular/lymphatic: Normal caliber thoracic aorta with moderate atherosclerotic disease. No significant stenosis. 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 Reproductive: No adnexal mass. Other: No abdominal wall hernia or abnormality. No abdominopelvic ascites. Musculoskeletal: No acute or significant osseous findings. VASCULAR MEASUREMENTS PERTINENT TO TAVR: AORTA: Minimal Aortic Diameter-12.3 mm Severity of Aortic Calcification-moderate RIGHT PELVIS: Right Common Iliac Artery - Minimal Diameter-8.6 mm Tortuosity-none Calcification-moderate Right External Iliac Artery - Minimal Diameter-6.2 mm Tortuosity-mild Calcification-none Right Common Femoral Artery - Minimal Diameter-7.6 mm Tortuosity-none Calcification-mild LEFT PELVIS: Left Common Iliac Artery - Minimal Diameter-8.4 mm Tortuosity-mild Calcification-mild Left External Iliac Artery - Minimal Diameter-7.0 mm Tortuosity-mild Calcification-none Left Common Femoral Artery - Minimal Diameter-8.4 mm Tortuosity-none Calcification-none Review of the MIP images confirms the above findings. IMPRESSION: 1. Vascular findings and measurements pertinent to potential TAVR procedure, as detailed above. 2. Thickening and calcification of the aortic valve, compatible with reported clinical history of aortic stenosis. 3. Moderate aortoiliac atherosclerosis. Mild 2 vessel coronary artery disease. 4. Small bilateral solid pulmonary nodules, largest measures 3 mm. No follow-up needed if patient is low-risk (and has no known or suspected primary neoplasm). Non-contrast chest CT can be considered in 12 months if patient is high-risk. This recommendation follows the consensus statement: Guidelines for Management of Incidental Pulmonary Nodules Detected on CT Images: From the Fleischner Society 2017; Radiology 2017; 284:228-243. 5. Post ablation changes of the upper  pole the left kidney. Electronically Signed   By: Yetta Glassman M.D.   On: 10/13/2022 15:31   CT CORONARY MORPH W/CTA COR W/SCORE W/CA W/CM &/OR WO/CM  Addendum Date: 10/13/2022   ADDENDUM REPORT: 10/13/2022 12:03 CLINICAL DATA:  Severe Aortic Stenosis. EXAM: Cardiac TAVR CT TECHNIQUE: A non-contrast, gated CT scan was obtained with axial slices of 3 mm through the heart for aortic valve calcium scoring. A 110 kV retrospective, gated, contrast cardiac scan was obtained. Gantry rotation speed was 250 msecs and collimation was 0.6 mm. Nitroglycerin was not given. The 3D data set was reconstructed in 5% intervals of the 0-95% of the R-R cycle. Systolic and diastolic phases were analyzed on a dedicated workstation using MPR, MIP, and VRT modes. The patient received 100 cc of contrast. FINDINGS: Image quality: Excellent. Noise artifact is: Limited. Valve Morphology: Tricuspid aortic valve with moderate calcifications. Leaflets are severely thickened with restricted movement in systole. Aortic Valve Calcium score: 569 Aortic annular dimension: Phase assessed: 20% Annular area: 442 mm2 Annular perimeter: 75.7 mm Max diameter: 26.0 mm Min diameter: 22.2 mm Annular and subannular calcification: None. Membranous septum length: 6.7 mm Optimal coplanar projection: LAO 8 CRA 6 Coronary Artery Height above Annulus: Left Main: 13.1 mm Right Coronary: 19.0 mm Sinus of Valsalva Measurements: Non-coronary: 31 mm Right-coronary: 31 mm Left-coronary: 32 mm Sinus of Valsalva Height: Non-coronary: 23.2 mm Right-coronary: 23.7 mm Left-coronary: 20.3 mm Sinotubular Junction: 26 mm Ascending Thoracic Aorta: 30 mm Coronary Arteries: Normal coronary origin. Right dominance. The study  was performed without use of NTG and is insufficient for plaque evaluation. Please refer to recent cardiac catheterization for coronary assessment. Cardiac Morphology: Right Atrium: Right atrial size is dilated. Right Ventricle: The right ventricular  cavity is within normal limits. Left Atrium: Left atrial size is dilated with no left atrial appendage filling defect. Small PFO. Left Ventricle: The ventricular cavity size is within normal limits. Pulmonary arteries: Dilated pulmonary artery suggestive of pulmonary hypertension. Pulmonary veins: Normal pulmonary venous drainage. Pericardium: Normal thickness with no significant effusion or calcium present. Mitral Valve: The mitral valve leaflets are thickened with mild annular calcium. Extra-cardiac findings: See attached radiology report for non-cardiac structures. IMPRESSION: 1. Annular measurements appropriate for 26 mm S3 (442 mm2) or 29 mm Evolut Pro. 2. No significant annular or subannular calcifications. 3. Sufficient coronary to annulus distance. 4. Optimal Fluoroscopic Angle for Delivery: LAO 8 CRA 6 5. Dilated pulmonary artery suggestive of pulmonary hypertension. [place] T. Audie Box, MD Electronically Signed   By: Eleonore Chiquito M.D.   On: 10/13/2022 12:03   Result Date: 10/13/2022 EXAM: OVER-READ INTERPRETATION  CT CHEST The following report is a limited chest CT over-read performed by radiologist Dr. Yetta Glassman of [facility], [place] on 10/13/2022. This over-read does not include interpretation of cardiac or coronary anatomy or pathology. The cardiac TAVR interpretation by the cardiologist is attached. COMPARISON:  None Available. FINDINGS: Extracardiac findings will be described separately under dictation for contemporaneously obtained CTA chest, abdomen and pelvis. IMPRESSION: Please see separate dictation for contemporaneously obtained CTA chest, abdomen and pelvis dated October 13, 2022 for full description of relevant extracardiac findings. Electronically Signed: By: Yetta Glassman M.D. On: 10/13/2022 11:31       I have independently reviewed the above radiology studies  and reviewed the findings with the patient.   Recent Lab Findings: Lab Results  Component Value Date   WBC  7.5 03/10/2021   HGB 12.3 03/10/2021   HCT 41.1 03/10/2021   PLT 250 03/10/2021   GLUCOSE 122 (H) 09/27/2022   CHOL 184 06/01/2020   TRIG 67 06/01/2020   HDL 51 06/01/2020   LDLCALC 120 (H) 06/01/2020   ALT 21 04/25/2022   AST 26 04/25/2022   NA 144 09/27/2022   K 4.2 09/27/2022   CL 103 09/27/2022   CREATININE 1.39 (H) 09/27/2022   BUN 25 09/27/2022   CO2 23 09/27/2022   TSH 2.220 04/25/2022   INR 1.2 11/17/2020   HGBA1C 6.2 (H) 11/17/2020     Assessment / Plan:   83F with symptomatic low flow low gradient AS. Meets criteria for AV replacement.  Risks/benefits/alternatives and expected length of stay reveiwed. All questions answered.   Valve: 23 vs 26 S3 or 29 Evolut. Area is 45m.  Approach: Femoral Bailout: Yes NYHA: II  I  spent 30 minutes with  the patient face to face in counseling and coordination of care.    DPierre BaliEnter 10/29/2022 7:28 PM

## 2022-11-03 ENCOUNTER — Ambulatory Visit (HOSPITAL_COMMUNITY)
Admission: RE | Admit: 2022-11-03 | Discharge: 2022-11-03 | Disposition: A | Discharge status: Home / Self Care | Payer: Medicare Other | Source: Ambulatory Visit | Attending: Cardiovascular Disease | Admitting: Cardiovascular Disease

## 2022-11-03 ENCOUNTER — Encounter (HOSPITAL_COMMUNITY)
Admission: RE | Admit: 2022-11-03 | Discharge: 2022-11-03 | Disposition: A | Discharge status: Home / Self Care | Payer: Medicare Other | Source: Ambulatory Visit | Attending: Cardiovascular Disease | Admitting: Cardiovascular Disease

## 2022-11-03 DIAGNOSIS — U071 COVID-19: Secondary | ICD-10-CM | POA: Diagnosis not present

## 2022-11-03 DIAGNOSIS — Z01818 Encounter for other preprocedural examination: Secondary | ICD-10-CM | POA: Insufficient documentation

## 2022-11-03 DIAGNOSIS — I350 Nonrheumatic aortic (valve) stenosis: Secondary | ICD-10-CM | POA: Diagnosis not present

## 2022-11-03 LAB — COMPREHENSIVE METABOLIC PANEL
ALT: 32 U/L (ref 0–44)
AST: 39 U/L (ref 15–41)
Albumin: 3.4 g/dL — ABNORMAL LOW (ref 3.5–5.0)
Alkaline Phosphatase: 88 U/L (ref 38–126)
Anion gap: 11 (ref 5–15)
BUN: 17 mg/dL (ref 8–23)
CO2: 22 mmol/L (ref 22–32)
Calcium: 8.7 mg/dL — ABNORMAL LOW (ref 8.9–10.3)
Chloride: 105 mmol/L (ref 98–111)
Creatinine, Ser: 1.4 mg/dL — ABNORMAL HIGH (ref 0.44–1.00)
GFR, Estimated: 38 mL/min — ABNORMAL LOW (ref >60)
Glucose, Bld: 133 mg/dL — ABNORMAL HIGH (ref 70–99)
Potassium: 3.6 mmol/L (ref 3.5–5.1)
Sodium: 138 mmol/L (ref 135–145)
Total Bilirubin: 0.6 mg/dL (ref 0.3–1.2)
Total Protein: 6.6 g/dL (ref 6.5–8.1)

## 2022-11-03 LAB — SURGICAL PCR SCREEN
MRSA, PCR: NEGATIVE
Staphylococcus aureus: POSITIVE — AB

## 2022-11-03 LAB — PROTIME-INR
INR: 1.1 (ref 0.8–1.2)
Prothrombin Time: 14.4 seconds (ref 11.4–15.2)

## 2022-11-03 LAB — CBC
HCT: 42.4 % (ref 36.0–46.0)
Hemoglobin: 13.9 g/dL (ref 12.0–15.0)
MCH: 31.7 pg (ref 26.0–34.0)
MCHC: 32.8 g/dL (ref 30.0–36.0)
MCV: 96.8 fL (ref 80.0–100.0)
Platelets: 150 10*3/uL (ref 150–400)
RBC: 4.38 MIL/uL (ref 3.87–5.11)
RDW: 15.4 % (ref 11.5–15.5)
WBC: 7.7 10*3/uL (ref 4.0–10.5)
nRBC: 0 % (ref 0.0–0.2)

## 2022-11-03 LAB — TYPE AND SCREEN
ABO/RH(D): A NEG
Antibody Screen: NEGATIVE

## 2022-11-03 LAB — SARS CORONAVIRUS 2 (TAT 6-24 HRS): SARS Coronavirus 2: POSITIVE — AB

## 2022-11-03 NOTE — Progress Notes (Addendum)
Letter with instructions given to patient as well as CHG soap. CHG soap instructions reviewed and patient questions answered.   Pt unable to obtain urine sample at PAT appt. Order placed for urinalysis DOS. Theodosia Quay made aware.

## 2022-11-07 ENCOUNTER — Encounter (HOSPITAL_COMMUNITY): Admission: RE | Payer: Self-pay | Source: Ambulatory Visit

## 2022-11-07 ENCOUNTER — Inpatient Hospital Stay (HOSPITAL_COMMUNITY): Admission: RE | Admit: 2022-11-07 | Payer: Medicare Other | Source: Ambulatory Visit | Admitting: Cardiovascular Disease

## 2022-11-07 SURGERY — IMPLANTATION, AORTIC VALVE, TRANSCATHETER, FEMORAL APPROACH
Anesthesia: Monitor Anesthesia Care | Site: Chest

## 2022-11-15 ENCOUNTER — Other Ambulatory Visit: Payer: Self-pay

## 2022-11-15 DIAGNOSIS — I350 Nonrheumatic aortic (valve) stenosis: Secondary | ICD-10-CM

## 2022-11-23 NOTE — Pre-Procedure Instructions (Deleted)
Surgical Instructions    Your procedure is scheduled on Tuesday, December 12th.  Report to [facility] Main Entrance "A" at 08:45 A.M., then check in with the Admitting office.  Call this number if you have problems the morning of surgery:  [phone]   If you have any questions prior to your surgery date call [phone]: Open Monday-Friday 8am-4pm    Remember:  Do not eat or drink after midnight the night before your surgery   STOP on Wednesday, 12/6, taking any Aspirin (unless otherwise instructed by your surgeon), Aleve, Naproxen, Ibuprofen, Motrin, Advil, Goody's, BC's, all herbal medications, fish oil, and all non-prescription vitamins.   Stop taking Eliquis on Thursday, 12/7. You will take your last dose on 12/6, Wednesday.    Continue taking all other medications without change through the day before surgery.  On the morning of surgery do not take any medications.                       Do NOT Smoke (Tobacco/Vaping) for 24 hours prior to your procedure.  If you use a CPAP at night, you may bring your mask/headgear for your overnight stay.   Contacts, glasses, piercing's, hearing aid's, dentures or partials may not be worn into surgery, please bring cases for these belongings.    For patients admitted to the hospital, discharge time will be determined by your treatment team.   Patients discharged the day of surgery will not be allowed to drive home, and someone needs to stay with them for 24 hours.  SURGICAL WAITING ROOM VISITATION Patients having surgery or a procedure may have no more than 2 support people in the waiting area - these visitors may rotate.   Children under the age of 12 must have an adult with them who is not the patient. If the patient needs to stay at the hospital during part of their recovery, the visitor guidelines for inpatient rooms apply. Pre-op nurse will coordinate an appropriate time for 1 support person to accompany patient in pre-op.  This  support person may not rotate.   Please refer to the [facility] website for the visitor guidelines for Inpatients (after your surgery is over and you are in a regular room).    Special instructions:   [facility]- Preparing For Surgery  Before surgery, you can play an important role. Because skin is not sterile, your skin needs to be as free of germs as possible. You can reduce the number of germs on your skin by washing with CHG (chlorahexidine gluconate) Soap before surgery.  CHG is an antiseptic cleaner which kills germs and bonds with the skin to continue killing germs even after washing.    Oral Hygiene is also important to reduce your risk of infection.  Remember - BRUSH YOUR TEETH THE MORNING OF SURGERY WITH YOUR REGULAR TOOTHPASTE  Please do not use if you have an allergy to CHG or antibacterial soaps. If your skin becomes reddened/irritated stop using the CHG.  Do not shave (including legs and underarms) for at least 48 hours prior to first CHG shower. It is OK to shave your face.  Please follow these instructions carefully.   Shower the NIGHT BEFORE SURGERY and the MORNING OF SURGERY  If you chose to wash your hair, wash your hair first as usual with your normal shampoo.  After you shampoo, rinse your hair and body thoroughly to remove the shampoo.  Use CHG Soap as you would any other liquid  soap. You can apply CHG directly to the skin and wash gently with a scrungie or a clean washcloth.   Apply the CHG Soap to your body ONLY FROM THE NECK DOWN.  Do not use on open wounds or open sores. Avoid contact with your eyes, ears, mouth and genitals (private parts). Wash Face and genitals (private parts)  with your normal soap.   Wash thoroughly, paying special attention to the area where your surgery will be performed.  Thoroughly rinse your body with warm water from the neck down.  DO NOT shower/wash with your normal soap after using and rinsing off the CHG Soap.  Pat yourself  dry with a CLEAN TOWEL.  Wear CLEAN PAJAMAS to bed the night before surgery  Place CLEAN SHEETS on your bed the night before your surgery  DO NOT SLEEP WITH PETS.   Day of Surgery: Take a shower with CHG soap. Do not wear jewelry or makeup Do not wear lotions, powders, perfumes, or deodorant. Do not shave 48 hours prior to surgery.  Do not bring valuables to the hospital. [facility] is not responsible for any belongings or valuables. Do not wear nail polish, gel polish, artificial nails, or any other type of covering on natural nails (fingers and toes) If you have artificial nails or gel coating that need to be removed by a nail salon, please have this removed prior to surgery. Artificial nails or gel coating may interfere with anesthesia's ability to adequately monitor your vital signs. Wear Clean/Comfortable clothing the morning of surgery Remember to brush your teeth WITH YOUR REGULAR TOOTHPASTE.   Please read over the following fact sheets that you were given.    If you received a COVID test during your pre-op visit  it is requested that you wear a mask when out in public, stay away from anyone that may not be feeling well and notify your surgeon if you develop symptoms. If you have been in contact with anyone that has tested positive in the last 10 days please notify you surgeon.

## 2022-11-24 ENCOUNTER — Other Ambulatory Visit: Payer: Self-pay

## 2022-11-24 ENCOUNTER — Encounter (HOSPITAL_COMMUNITY)
Admission: RE | Admit: 2022-11-24 | Discharge: 2022-11-24 | Disposition: A | Discharge status: Home / Self Care | Payer: Medicare Other | Source: Ambulatory Visit | Attending: Cardiovascular Disease | Admitting: Cardiovascular Disease

## 2022-11-24 ENCOUNTER — Telehealth: Payer: Self-pay

## 2022-11-24 ENCOUNTER — Ambulatory Visit (HOSPITAL_COMMUNITY)
Admission: RE | Admit: 2022-11-24 | Discharge: 2022-11-24 | Disposition: A | Discharge status: Home / Self Care | Payer: Medicare Other | Source: Ambulatory Visit | Attending: Cardiovascular Disease | Admitting: Cardiovascular Disease

## 2022-11-24 DIAGNOSIS — I350 Nonrheumatic aortic (valve) stenosis: Secondary | ICD-10-CM | POA: Diagnosis not present

## 2022-11-24 DIAGNOSIS — Z01818 Encounter for other preprocedural examination: Secondary | ICD-10-CM | POA: Diagnosis not present

## 2022-11-24 LAB — TYPE AND SCREEN
ABO/RH(D): A NEG
Antibody Screen: NEGATIVE

## 2022-11-24 LAB — COMPREHENSIVE METABOLIC PANEL
ALT: 23 U/L (ref 0–44)
AST: 25 U/L (ref 15–41)
Albumin: 3.6 g/dL (ref 3.5–5.0)
Alkaline Phosphatase: 91 U/L (ref 38–126)
Anion gap: 9 (ref 5–15)
BUN: 23 mg/dL (ref 8–23)
CO2: 24 mmol/L (ref 22–32)
Calcium: 9.1 mg/dL (ref 8.9–10.3)
Chloride: 107 mmol/L (ref 98–111)
Creatinine, Ser: 1.27 mg/dL — ABNORMAL HIGH (ref 0.44–1.00)
GFR, Estimated: 43 mL/min — ABNORMAL LOW (ref >60)
Glucose, Bld: 125 mg/dL — ABNORMAL HIGH (ref 70–99)
Potassium: 3.3 mmol/L — ABNORMAL LOW (ref 3.5–5.1)
Sodium: 140 mmol/L (ref 135–145)
Total Bilirubin: 0.9 mg/dL (ref 0.3–1.2)
Total Protein: 6.7 g/dL (ref 6.5–8.1)

## 2022-11-24 LAB — PROTIME-INR
INR: 1.1 (ref 0.8–1.2)
Prothrombin Time: 13.6 seconds (ref 11.4–15.2)

## 2022-11-24 LAB — URINALYSIS, ROUTINE W REFLEX MICROSCOPIC
Bilirubin Urine: NEGATIVE
Glucose, UA: NEGATIVE mg/dL
Hgb urine dipstick: NEGATIVE
Ketones, ur: NEGATIVE mg/dL
Nitrite: NEGATIVE
Protein, ur: NEGATIVE mg/dL
Specific Gravity, Urine: 1.016 (ref 1.005–1.030)
pH: 5 (ref 5.0–8.0)

## 2022-11-24 LAB — CBC
HCT: 41.5 % (ref 36.0–46.0)
Hemoglobin: 13.3 g/dL (ref 12.0–15.0)
MCH: 31 pg (ref 26.0–34.0)
MCHC: 32 g/dL (ref 30.0–36.0)
MCV: 96.7 fL (ref 80.0–100.0)
Platelets: 225 10*3/uL (ref 150–400)
RBC: 4.29 MIL/uL (ref 3.87–5.11)
RDW: 15.3 % (ref 11.5–15.5)
WBC: 9.9 10*3/uL (ref 4.0–10.5)
nRBC: 0 % (ref 0.0–0.2)

## 2022-11-24 NOTE — Telephone Encounter (Signed)
Burnell Blanks, MD  Crista Luria; Barkley Boards, RN [employer]. Can we have Ms. Carreiro take an extra 40 of KCl over the next three days? Thanks, chris   I spoke with the pt and made her aware of lab results and instructions to take an additional 4mq of potassium chloride.  The pt verbalized understanding and plans to take extra dosage today.

## 2022-11-24 NOTE — Progress Notes (Signed)
Pt signed consents for surgery. Lab work, CXR obtained. CHG shower instructions reviewed.  Covid: Tested positive for Covid 11/03/22, did not repeat per protocol. Pt states she feels well today, no residual symptoms.   Surgical PCR: Negative 11/03/22. Did not retest per protocol, surgery within 30 days of test.

## 2022-11-27 IMAGING — CT CT GUIDANCE TISSUE ABLATION
2 of 10 series · 12 of 36 positions shown, 15 images · non-contrast
Comparison: CT abdomen pelvis from 08/05/2020

INDICATION: 79-year-old female with incidentally discovered suspected left renal
cell carcinoma presenting for ablated therapy.

EXAM:
CT BIOPSY; CT GUIDED ABLATION

[Series 2: i-spiral 2.0 bf37 · axial · 0.93mm/px · z∈[-192,-20]mm · 9 of 106 slices shown, 12 images]
[im 10/106  mediastinal]
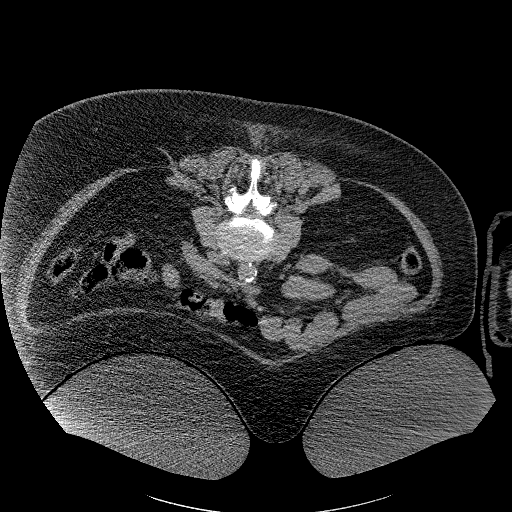
[im 10/106  lung]
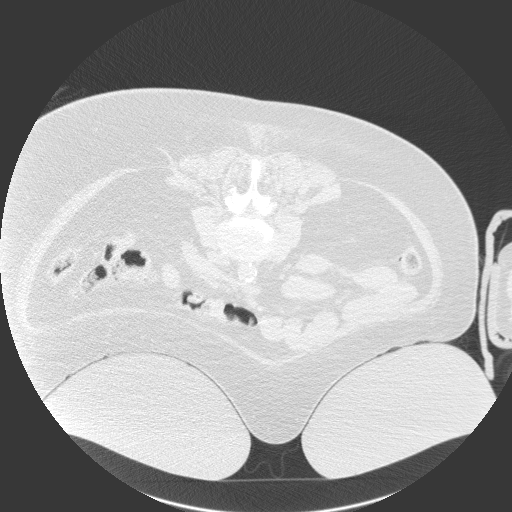
[im 20/106  lung]
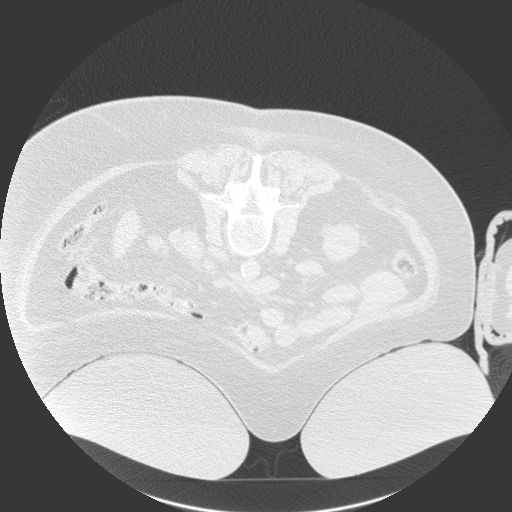
[im 29/106  lung]
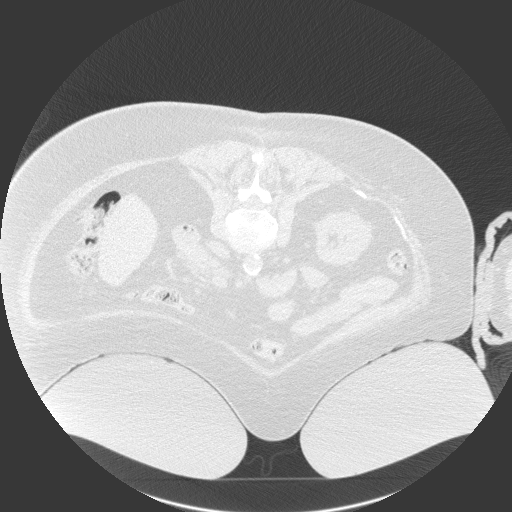
[im 39/106  lung]
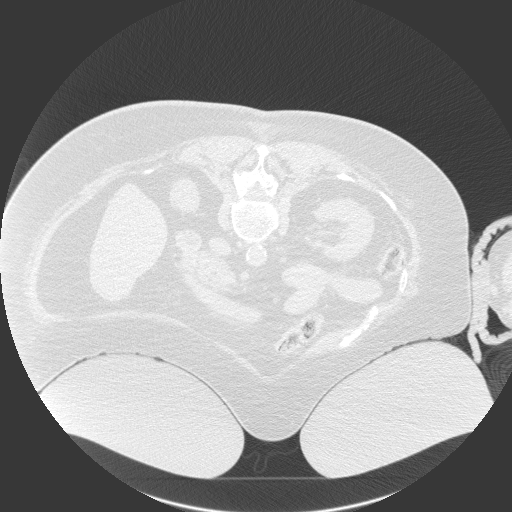
[im 58/106  mediastinal]
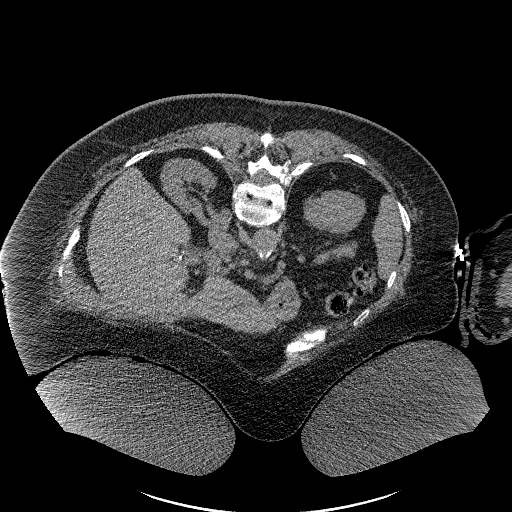
[im 58/106  lung]
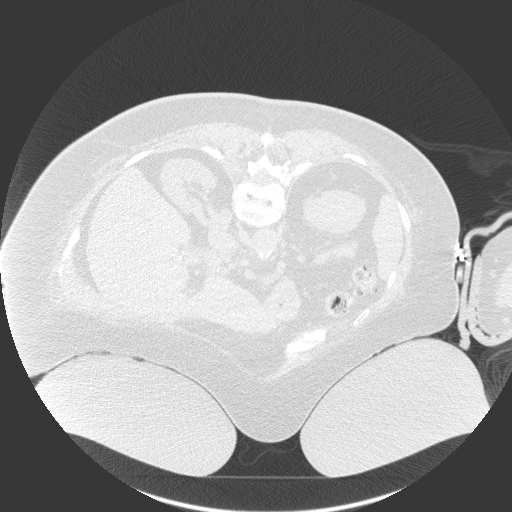
[im 67/106  lung]
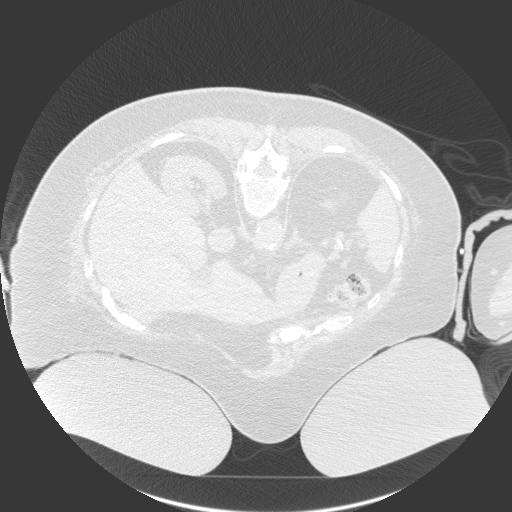
[im 77/106  lung]
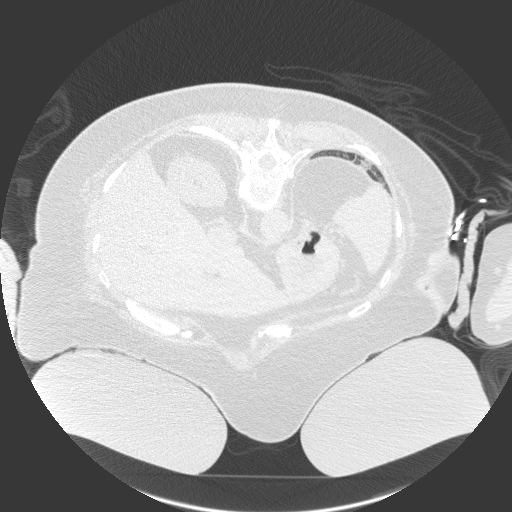
[im 86/106  lung]
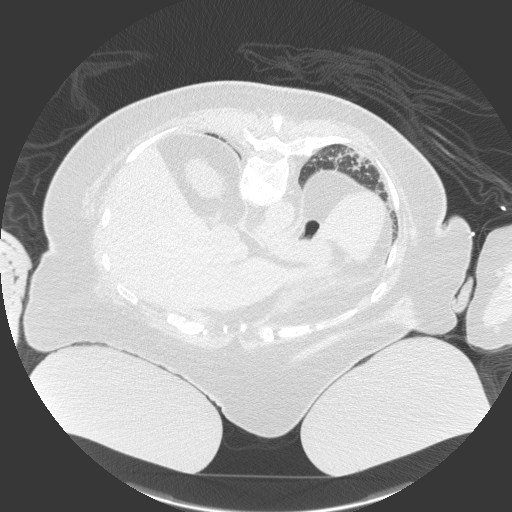
[im 96/106  mediastinal]
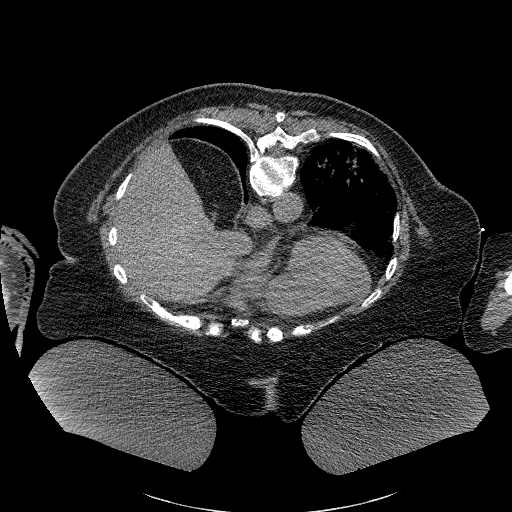
[im 96/106  lung]
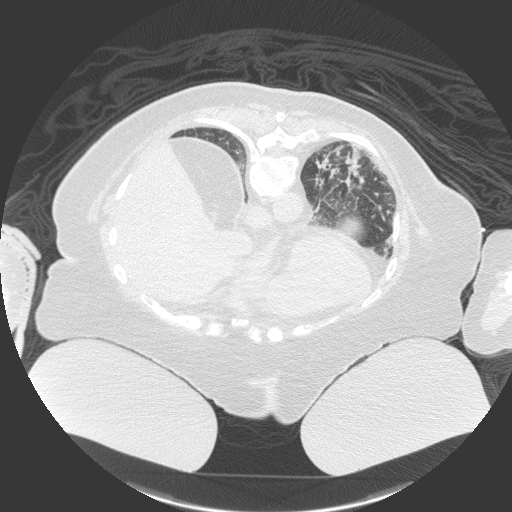

[Series 3: i-spiral 2.0 mpr cor · coronal · 0.46mm/px · 3 of 175 slices shown]
[im 35/175  lung]
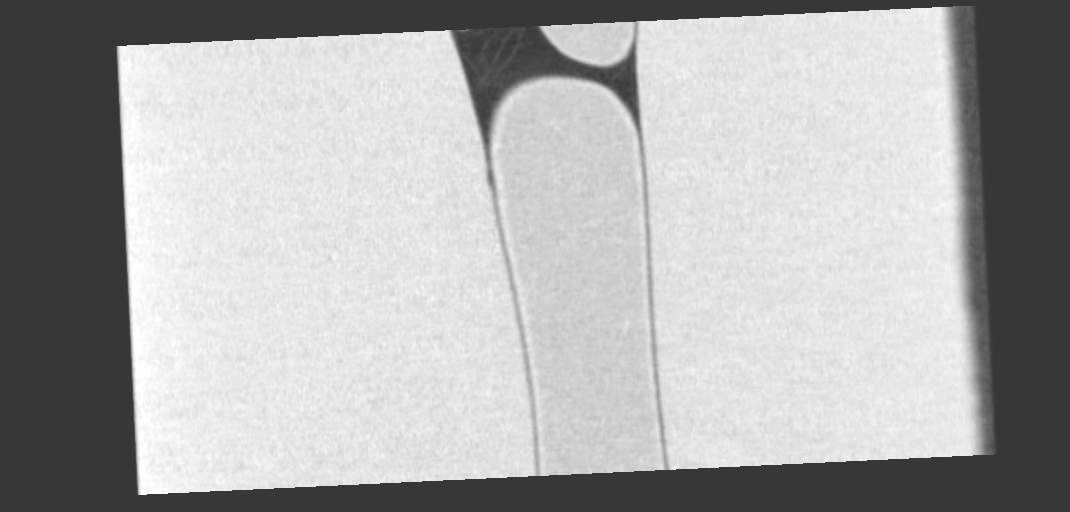
[im 70/175  lung]
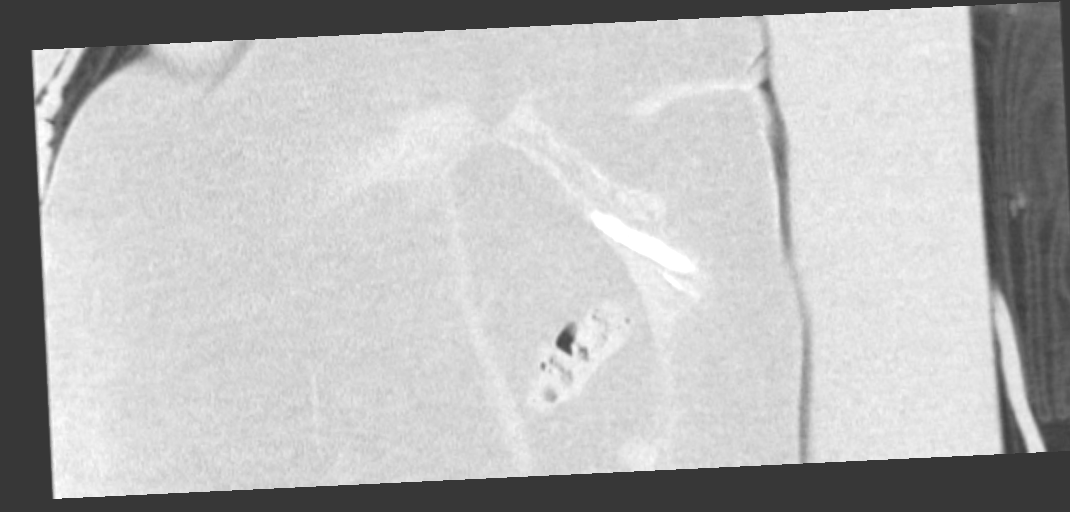
[im 105/175  lung]
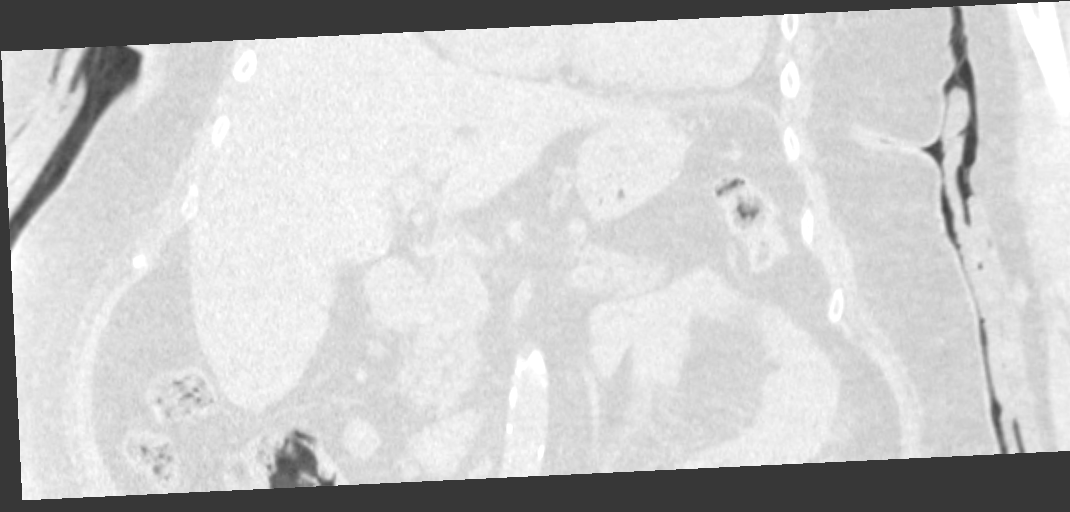

[12 of 36 positions shown; findings below may reference images not displayed]

MEDICATIONS:
As per Anesthesia record.

ANESTHESIA/SEDATION:
General anesthesia.

CONTRAST:  None.

COMPLICATIONS:
None immediate.

PROCEDURE:
Informed consent was obtained from the patient following an
explanation of the procedure, risks, benefits and alternatives. A
time out was performed prior to the initiation of the procedure.

The patient was positioned prone on the CT table and a limited CT
was performed for procedural planning demonstrating 3.0 x 2.7 x
cm partially exophytic left posterior upper pole mass. The procedure
was planned. The operative site was prepped and draped in the usual
sterile fashion. Under intermittent CT guidance, a 17 gauge coaxial
needle was advanced into the peripheral aspect of the mass.

Appropriate positioning was confirmed and a total of 3 samples were
obtained with an 18 gauge core needle biopsy device. The co-axial
needle was removed and hemostasis was achieved with manual
compression.

Next, a 15 cm, 17 gauge Neuwave ablation probe was inserted through
the central aspect of the mass under intermittent CT guidance.
Limited range CT through the kidney was reformatted in coronal and
sagittal formats to confirm positioning. Ablation was performed at
65 Mada for total of 7 minutes. The probe was removed and manual
compression was applied to achieve immediate hemostasis. Limited
range CT after ablation demonstrated post ablated changes about the
mass with no evidence of significant perirenal hemorrhage or
pneumothorax.

A dressing was placed. The patient was extubated and transferred to
the PACU. The patient tolerated the procedure well without immediate
postprocedural complication.
IMPRESSION: 1. Technically successful CT guided core needle biopsy of left renal
mass.
2. Technically successful percutaneous microwave ablation of left
renal mass.

PLAN:
Follow-up in IR clinic in 1 month with multiphase CT abdomen (renal
mass protocol) to evaluate treatment response.

## 2022-11-27 MED ORDER — POTASSIUM CHLORIDE 2 MEQ/ML IV SOLN
80.0000 meq | INTRAVENOUS | Status: DC
  Filled 2022-11-27: qty 40

## 2022-11-27 MED ORDER — NOREPINEPHRINE 4 MG/250ML-% IV SOLN
0.0000 ug/min | INTRAVENOUS | Status: AC
  Administered 2022-11-28: 1 ug/min via INTRAVENOUS
  Filled 2022-11-27: qty 250

## 2022-11-27 MED ORDER — MAGNESIUM SULFATE 50 % IJ SOLN
40.0000 meq | INTRAMUSCULAR | Status: DC
  Filled 2022-11-27: qty 9.85

## 2022-11-27 MED ORDER — DEXMEDETOMIDINE HCL IN NACL 400 MCG/100ML IV SOLN
0.1000 ug/kg/h | INTRAVENOUS | Status: AC
  Administered 2022-11-28: 123.4 ug via INTRAVENOUS
  Filled 2022-11-27: qty 100

## 2022-11-27 MED ORDER — CEFAZOLIN IN SODIUM CHLORIDE 3-0.9 GM/100ML-% IV SOLN
3.0000 g | INTRAVENOUS | Status: AC
  Administered 2022-11-28: 3 g via INTRAVENOUS
  Filled 2022-11-27: qty 100

## 2022-11-27 MED ORDER — HEPARIN 30,000 UNITS/1000 ML (OHS) CELLSAVER SOLUTION
Status: DC
  Filled 2022-11-27: qty 1000

## 2022-11-28 ENCOUNTER — Other Ambulatory Visit: Payer: Self-pay | Admitting: Physician Assistant

## 2022-11-28 ENCOUNTER — Inpatient Hospital Stay (HOSPITAL_COMMUNITY)
Admission: RE | Admit: 2022-11-28 | Discharge: 2022-11-29 | DRG: 267 | Disposition: A | Discharge status: Home / Self Care | Payer: Medicare Other | Attending: Cardiovascular Disease | Admitting: Cardiovascular Disease

## 2022-11-28 ENCOUNTER — Other Ambulatory Visit: Payer: Self-pay

## 2022-11-28 ENCOUNTER — Inpatient Hospital Stay (HOSPITAL_COMMUNITY): Payer: Medicare Other | Admitting: Physician Assistant

## 2022-11-28 ENCOUNTER — Encounter (HOSPITAL_COMMUNITY)
Admission: RE | Disposition: A | Discharge status: Home / Self Care | Payer: Self-pay | Source: Home / Self Care | Attending: Cardiovascular Disease

## 2022-11-28 ENCOUNTER — Encounter (HOSPITAL_COMMUNITY): Payer: Self-pay | Admitting: Cardiovascular Disease

## 2022-11-28 ENCOUNTER — Inpatient Hospital Stay (HOSPITAL_COMMUNITY): Payer: Medicare Other

## 2022-11-28 DIAGNOSIS — I129 Hypertensive chronic kidney disease with stage 1 through stage 4 chronic kidney disease, or unspecified chronic kidney disease: Secondary | ICD-10-CM | POA: Diagnosis not present

## 2022-11-28 DIAGNOSIS — Z887 Allergy status to serum and vaccine status: Secondary | ICD-10-CM

## 2022-11-28 DIAGNOSIS — I4819 Other persistent atrial fibrillation: Secondary | ICD-10-CM | POA: Diagnosis not present

## 2022-11-28 DIAGNOSIS — Z952 Presence of prosthetic heart valve: Secondary | ICD-10-CM

## 2022-11-28 DIAGNOSIS — Z8249 Family history of ischemic heart disease and other diseases of the circulatory system: Secondary | ICD-10-CM | POA: Diagnosis not present

## 2022-11-28 DIAGNOSIS — Z006 Encounter for examination for normal comparison and control in clinical research program: Secondary | ICD-10-CM | POA: Diagnosis not present

## 2022-11-28 DIAGNOSIS — E039 Hypothyroidism, unspecified: Secondary | ICD-10-CM | POA: Diagnosis not present

## 2022-11-28 DIAGNOSIS — Z7901 Long term (current) use of anticoagulants: Secondary | ICD-10-CM | POA: Diagnosis not present

## 2022-11-28 DIAGNOSIS — K219 Gastro-esophageal reflux disease without esophagitis: Secondary | ICD-10-CM | POA: Diagnosis not present

## 2022-11-28 DIAGNOSIS — I447 Left bundle-branch block, unspecified: Secondary | ICD-10-CM | POA: Insufficient documentation

## 2022-11-28 DIAGNOSIS — E119 Type 2 diabetes mellitus without complications: Secondary | ICD-10-CM | POA: Diagnosis not present

## 2022-11-28 DIAGNOSIS — I2510 Atherosclerotic heart disease of native coronary artery without angina pectoris: Secondary | ICD-10-CM

## 2022-11-28 DIAGNOSIS — M1990 Unspecified osteoarthritis, unspecified site: Secondary | ICD-10-CM | POA: Diagnosis not present

## 2022-11-28 DIAGNOSIS — N183 Chronic kidney disease, stage 3 unspecified: Secondary | ICD-10-CM | POA: Diagnosis not present

## 2022-11-28 DIAGNOSIS — Z833 Family history of diabetes mellitus: Secondary | ICD-10-CM

## 2022-11-28 DIAGNOSIS — Z87442 Personal history of urinary calculi: Secondary | ICD-10-CM

## 2022-11-28 DIAGNOSIS — I10 Essential (primary) hypertension: Secondary | ICD-10-CM

## 2022-11-28 DIAGNOSIS — I252 Old myocardial infarction: Secondary | ICD-10-CM

## 2022-11-28 DIAGNOSIS — Z888 Allergy status to other drugs, medicaments and biological substances status: Secondary | ICD-10-CM

## 2022-11-28 DIAGNOSIS — Z79899 Other long term (current) drug therapy: Secondary | ICD-10-CM

## 2022-11-28 DIAGNOSIS — I350 Nonrheumatic aortic (valve) stenosis: Secondary | ICD-10-CM

## 2022-11-28 DIAGNOSIS — I440 Atrioventricular block, first degree: Secondary | ICD-10-CM | POA: Diagnosis not present

## 2022-11-28 DIAGNOSIS — Z90710 Acquired absence of both cervix and uterus: Secondary | ICD-10-CM | POA: Diagnosis not present

## 2022-11-28 DIAGNOSIS — Z9049 Acquired absence of other specified parts of digestive tract: Secondary | ICD-10-CM

## 2022-11-28 DIAGNOSIS — Z6841 Body Mass Index (BMI) 40.0 and over, adult: Secondary | ICD-10-CM | POA: Diagnosis not present

## 2022-11-28 DIAGNOSIS — E785 Hyperlipidemia, unspecified: Secondary | ICD-10-CM | POA: Diagnosis present

## 2022-11-28 DIAGNOSIS — E1122 Type 2 diabetes mellitus with diabetic chronic kidney disease: Secondary | ICD-10-CM | POA: Diagnosis present

## 2022-11-28 HISTORY — DX: Presence of prosthetic heart valve: Z95.2

## 2022-11-28 HISTORY — PX: TRANSCATHETER AORTIC VALVE REPLACEMENT, TRANSFEMORAL: SHX6400

## 2022-11-28 HISTORY — PX: INTRAOPERATIVE TRANSTHORACIC ECHOCARDIOGRAM: SHX6523

## 2022-11-28 LAB — POCT I-STAT, CHEM 8
BUN: 23 mg/dL (ref 8–23)
BUN: 24 mg/dL — ABNORMAL HIGH (ref 8–23)
Calcium, Ion: 1.12 mmol/L — ABNORMAL LOW (ref 1.15–1.40)
Calcium, Ion: 1.16 mmol/L (ref 1.15–1.40)
Chloride: 110 mmol/L (ref 98–111)
Chloride: 108 mmol/L (ref 98–111)
Creatinine, Ser: 1.2 mg/dL — ABNORMAL HIGH (ref 0.44–1.00)
Creatinine, Ser: 1.3 mg/dL — ABNORMAL HIGH (ref 0.44–1.00)
Glucose, Bld: 144 mg/dL — ABNORMAL HIGH (ref 70–99)
Glucose, Bld: 163 mg/dL — ABNORMAL HIGH (ref 70–99)
HCT: 40 % (ref 36.0–46.0)
HCT: 34 % — ABNORMAL LOW (ref 36.0–46.0)
Hemoglobin: 13.6 g/dL (ref 12.0–15.0)
Hemoglobin: 11.6 g/dL — ABNORMAL LOW (ref 12.0–15.0)
Potassium: 3.3 mmol/L — ABNORMAL LOW (ref 3.5–5.1)
Potassium: 3.4 mmol/L — ABNORMAL LOW (ref 3.5–5.1)
Sodium: 145 mmol/L (ref 135–145)
Sodium: 144 mmol/L (ref 135–145)
TCO2: 24 mmol/L (ref 22–32)
TCO2: 22 mmol/L (ref 22–32)

## 2022-11-28 LAB — GLUCOSE, CAPILLARY
Glucose-Capillary: 176 mg/dL — ABNORMAL HIGH (ref 70–99)
Glucose-Capillary: 111 mg/dL — ABNORMAL HIGH (ref 70–99)

## 2022-11-28 LAB — ECHOCARDIOGRAM LIMITED
AR max vel: 5.99 cm2
AV Area VTI: 5.52 cm2
AV Area mean vel: 5.44 cm2
AV Mean grad: 2 mmHg
AV Peak grad: 4.2 mmHg
Ao pk vel: 1.02 m/s

## 2022-11-28 SURGERY — IMPLANTATION, AORTIC VALVE, TRANSCATHETER, FEMORAL APPROACH
Anesthesia: Monitor Anesthesia Care | Site: Chest

## 2022-11-28 MED ORDER — PROPOFOL 10 MG/ML IV BOLUS
INTRAVENOUS | Status: DC | PRN
  Administered 2022-11-28 (×4): 10 mg via INTRAVENOUS

## 2022-11-28 MED ORDER — CHLORHEXIDINE GLUCONATE 0.12 % MT SOLN
15.0000 mL | Freq: Once | OROMUCOSAL | Status: AC
  Administered 2022-11-28: 15 mL via OROMUCOSAL
  Filled 2022-11-28: qty 15

## 2022-11-28 MED ORDER — APIXABAN 5 MG PO TABS
5.0000 mg | ORAL_TABLET | Freq: Two times a day (BID) | ORAL | Status: DC
  Administered 2022-11-29: 5 mg via ORAL
  Filled 2022-11-28: qty 1

## 2022-11-28 MED ORDER — ACETAMINOPHEN 325 MG PO TABS: 650.0000 mg | ORAL_TABLET | Freq: Four times a day (QID) | ORAL | Status: DC | PRN

## 2022-11-28 MED ORDER — SODIUM CHLORIDE 0.9 % IV SOLN: INTRAVENOUS | Status: AC

## 2022-11-28 MED ORDER — NITROGLYCERIN IN D5W 200-5 MCG/ML-% IV SOLN
0.0000 ug/min | INTRAVENOUS | Status: DC
  Filled 2022-11-28: qty 250

## 2022-11-28 MED ORDER — IODIXANOL 320 MG/ML IV SOLN
INTRAVENOUS | Status: DC | PRN
  Administered 2022-11-28: 50 mL

## 2022-11-28 MED ORDER — HEPARIN SODIUM (PORCINE) 1000 UNIT/ML IJ SOLN
INTRAMUSCULAR | Status: DC | PRN
  Administered 2022-11-28: 21000 [IU] via INTRAVENOUS

## 2022-11-28 MED ORDER — 0.9 % SODIUM CHLORIDE (POUR BTL) OPTIME
TOPICAL | Status: DC | PRN
  Administered 2022-11-28: 1000 mL
  Administered 2022-11-28: 2000 mL

## 2022-11-28 MED ORDER — CHLORHEXIDINE GLUCONATE 4 % EX LIQD: 60.0000 mL | Freq: Once | CUTANEOUS | Status: DC

## 2022-11-28 MED ORDER — PROTAMINE SULFATE 10 MG/ML IV SOLN
INTRAVENOUS | Status: DC | PRN
  Administered 2022-11-28: 210 mg via INTRAVENOUS

## 2022-11-28 MED ORDER — TORSEMIDE 20 MG PO TABS
20.0000 mg | ORAL_TABLET | Freq: Every day | ORAL | Status: DC
  Administered 2022-11-28 – 2022-11-29 (×2): 20 mg via ORAL
  Filled 2022-11-28 (×2): qty 1

## 2022-11-28 MED ORDER — PROTAMINE SULFATE 10 MG/ML IV SOLN
INTRAVENOUS | Status: AC
  Filled 2022-11-28: qty 25

## 2022-11-28 MED ORDER — SODIUM CHLORIDE 0.9 % IV SOLN: INTRAVENOUS | Status: DC

## 2022-11-28 MED ORDER — SODIUM CHLORIDE 0.9 % IV SOLN: 250.0000 mL | INTRAVENOUS | Status: DC | PRN

## 2022-11-28 MED ORDER — AMIODARONE HCL 200 MG PO TABS
200.0000 mg | ORAL_TABLET | Freq: Every day | ORAL | Status: DC
  Administered 2022-11-28 – 2022-11-29 (×2): 200 mg via ORAL
  Filled 2022-11-28 (×2): qty 1

## 2022-11-28 MED ORDER — TRAMADOL HCL 50 MG PO TABS: 50.0000 mg | ORAL_TABLET | ORAL | Status: DC | PRN

## 2022-11-28 MED ORDER — HEPARIN 6000 UNIT IRRIGATION SOLUTION
Status: DC | PRN
  Administered 2022-11-28 (×2): 1

## 2022-11-28 MED ORDER — LACTATED RINGERS IV SOLN: INTRAVENOUS | Status: DC | PRN

## 2022-11-28 MED ORDER — ACETAMINOPHEN 650 MG RE SUPP: 650.0000 mg | Freq: Four times a day (QID) | RECTAL | Status: DC | PRN

## 2022-11-28 MED ORDER — PROTAMINE SULFATE 10 MG/ML IV SOLN
INTRAVENOUS | Status: AC
  Filled 2022-11-28: qty 5

## 2022-11-28 MED ORDER — SODIUM CHLORIDE 0.9% FLUSH
3.0000 mL | Freq: Two times a day (BID) | INTRAVENOUS | Status: DC
  Administered 2022-11-28 – 2022-11-29 (×2): 3 mL via INTRAVENOUS

## 2022-11-28 MED ORDER — CHLORHEXIDINE GLUCONATE 4 % EX LIQD: 30.0000 mL | CUTANEOUS | Status: DC

## 2022-11-28 MED ORDER — MORPHINE SULFATE (PF) 2 MG/ML IV SOLN: 1.0000 mg | INTRAVENOUS | Status: DC | PRN

## 2022-11-28 MED ORDER — FENTANYL CITRATE (PF) 250 MCG/5ML IJ SOLN
INTRAMUSCULAR | Status: AC
  Filled 2022-11-28: qty 5

## 2022-11-28 MED ORDER — LIDOCAINE HCL 1 % IJ SOLN
INTRAMUSCULAR | Status: DC | PRN
  Administered 2022-11-28: 18 mL via INTRADERMAL

## 2022-11-28 MED ORDER — SODIUM CHLORIDE 0.9% FLUSH: 3.0000 mL | INTRAVENOUS | Status: DC | PRN

## 2022-11-28 MED ORDER — INSULIN ASPART 100 UNIT/ML IJ SOLN: 0.0000 [IU] | INTRAMUSCULAR | Status: DC | PRN

## 2022-11-28 MED ORDER — LIDOCAINE HCL 1 % IJ SOLN
INTRAMUSCULAR | Status: AC
  Filled 2022-11-28: qty 20

## 2022-11-28 MED ORDER — HEPARIN 6000 UNIT IRRIGATION SOLUTION
Status: AC
  Filled 2022-11-28: qty 1500

## 2022-11-28 MED ORDER — CEFAZOLIN SODIUM 1 G IJ SOLR
INTRAMUSCULAR | Status: AC
  Filled 2022-11-28: qty 10

## 2022-11-28 MED ORDER — FENTANYL CITRATE (PF) 250 MCG/5ML IJ SOLN
INTRAMUSCULAR | Status: DC | PRN
  Administered 2022-11-28: 25 ug via INTRAVENOUS
  Administered 2022-11-28: 50 ug via INTRAVENOUS
  Administered 2022-11-28: 25 ug via INTRAVENOUS

## 2022-11-28 MED ORDER — ONDANSETRON HCL 4 MG/2ML IJ SOLN: 4.0000 mg | Freq: Four times a day (QID) | INTRAMUSCULAR | Status: DC | PRN

## 2022-11-28 MED ORDER — POTASSIUM CHLORIDE CRYS ER 20 MEQ PO TBCR
40.0000 meq | EXTENDED_RELEASE_TABLET | Freq: Every day | ORAL | Status: DC
  Administered 2022-11-28 – 2022-11-29 (×2): 40 meq via ORAL
  Filled 2022-11-28 (×2): qty 2

## 2022-11-28 MED ORDER — HEPARIN SODIUM (PORCINE) 1000 UNIT/ML IJ SOLN
INTRAMUSCULAR | Status: AC
  Filled 2022-11-28: qty 1

## 2022-11-28 MED ORDER — CEFAZOLIN SODIUM-DEXTROSE 2-4 GM/100ML-% IV SOLN
2.0000 g | Freq: Three times a day (TID) | INTRAVENOUS | Status: AC
  Administered 2022-11-28 – 2022-11-29 (×2): 2 g via INTRAVENOUS
  Filled 2022-11-28 (×2): qty 100

## 2022-11-28 MED ORDER — OXYCODONE HCL 5 MG PO TABS: 5.0000 mg | ORAL_TABLET | ORAL | Status: DC | PRN

## 2022-11-28 MED ORDER — PHENYLEPHRINE 80 MCG/ML (10ML) SYRINGE FOR IV PUSH (FOR BLOOD PRESSURE SUPPORT)
PREFILLED_SYRINGE | INTRAVENOUS | Status: DC | PRN
  Administered 2022-11-28 (×3): 40 ug via INTRAVENOUS
  Administered 2022-11-28: 80 ug via INTRAVENOUS

## 2022-11-28 MED ORDER — PROPOFOL 500 MG/50ML IV EMUL
INTRAVENOUS | Status: DC | PRN
  Administered 2022-11-28: 10 ug/kg/min via INTRAVENOUS

## 2022-11-28 SURGICAL SUPPLY — 71 items
BAG COUNTER SPONGE SURGICOUNT (BAG) ×2 IMPLANT
BAG DECANTER FOR FLEXI CONT (MISCELLANEOUS) IMPLANT
BLADE CLIPPER SURG (BLADE) IMPLANT
BLADE OSCILLATING /SAGITTAL (BLADE) IMPLANT
BLADE STERNUM SYSTEM 6 (BLADE) IMPLANT
CABLE ADAPT CONN TEMP 6FT (ADAPTER) ×2 IMPLANT
CABLE ADAPT PACING TEMP 12FT (ADAPTER) IMPLANT
CABLE PACING FASLOC BIEGE (MISCELLANEOUS) IMPLANT
CATH DIAG 6FR PIGTAIL (CATHETERS) IMPLANT
CATH DIAG EXPO 6F AL1 (CATHETERS) IMPLANT
CATH DIAG EXPO 6F VENT PIG 145 (CATHETERS) ×4 IMPLANT
CATH INFINITI 6F AL2 (CATHETERS) IMPLANT
CATH S G BIP PACING (CATHETERS) ×2 IMPLANT
CHLORAPREP W/TINT 26 (MISCELLANEOUS) ×2 IMPLANT
CLOSURE MYNX CONTROL 6F/7F (Vascular Products) IMPLANT
CLOSURE PERCLOSE PROSTYLE (VASCULAR PRODUCTS) IMPLANT
CNTNR URN SCR LID CUP LEK RST (MISCELLANEOUS) ×4 IMPLANT
CONT SPEC 4OZ STRL OR WHT (MISCELLANEOUS) ×4
COVER BACK TABLE 80X110 HD (DRAPES) ×2 IMPLANT
DERMABOND ADVANCED .7 DNX12 (GAUZE/BANDAGES/DRESSINGS) ×2 IMPLANT
DEVICE CLOSURE PERCLS PRGLD 6F (VASCULAR PRODUCTS) ×4 IMPLANT
DRSG TEGADERM 4X4.75 (GAUZE/BANDAGES/DRESSINGS) ×4 IMPLANT
DRYSEAL FLEXSHEATH 14FR 33CM (SHEATH) ×2
ELECT REM PT RETURN 9FT ADLT (ELECTROSURGICAL) ×2
ELECTRODE REM PT RTRN 9FT ADLT (ELECTROSURGICAL) ×2 IMPLANT
GAUZE 4X4 16PLY ~~LOC~~+RFID DBL (SPONGE) IMPLANT
GAUZE SPONGE 4X4 12PLY STRL (GAUZE/BANDAGES/DRESSINGS) ×2 IMPLANT
GLOVE BIO SURGEON STRL SZ7.5 (GLOVE) ×2 IMPLANT
GLOVE BIO SURGEON STRL SZ8 (GLOVE) IMPLANT
GLOVE BIOGEL PI IND STRL 6.5 (GLOVE) IMPLANT
GLOVE ECLIPSE 7.0 STRL STRAW (GLOVE) ×2 IMPLANT
GLOVE ECLIPSE 7.5 STRL STRAW (GLOVE) IMPLANT
GOWN STRL REUS W/ TWL LRG LVL3 (GOWN DISPOSABLE) IMPLANT
GOWN STRL REUS W/ TWL XL LVL3 (GOWN DISPOSABLE) ×2 IMPLANT
GOWN STRL REUS W/TWL LRG LVL3 (GOWN DISPOSABLE) ×2
GOWN STRL REUS W/TWL XL LVL3 (GOWN DISPOSABLE) ×4
GUIDEWIRE CNFDA BRKR CVD (WIRE) IMPLANT
KIT BASIN OR (CUSTOM PROCEDURE TRAY) ×2 IMPLANT
KIT DILATOR VASC 18G NDL (KITS) IMPLANT
KIT HEART LEFT (KITS) ×2 IMPLANT
KIT TURNOVER KIT B (KITS) ×2 IMPLANT
NS IRRIG 1000ML POUR BTL (IV SOLUTION) ×2 IMPLANT
PACK ENDO MINOR (CUSTOM PROCEDURE TRAY) ×2 IMPLANT
PAD ARMBOARD 7.5X6 YLW CONV (MISCELLANEOUS) ×4 IMPLANT
PAD ELECT DEFIB RADIOL ZOLL (MISCELLANEOUS) ×2 IMPLANT
PERCLOSE PROGLIDE 6F (VASCULAR PRODUCTS) ×4
POSITIONER HEAD DONUT 9IN (MISCELLANEOUS) ×2 IMPLANT
SET MICROPUNCTURE 5F STIFF (MISCELLANEOUS) ×2 IMPLANT
SHEATH BRITE TIP 7FR 35CM (SHEATH) ×2 IMPLANT
SHEATH DRYSEAL FLEX 14FR 33CM (SHEATH) IMPLANT
SHEATH PINNACLE 6F 10CM (SHEATH) ×2 IMPLANT
SHEATH PINNACLE 8F 10CM (SHEATH) ×2 IMPLANT
SLEEVE REPOSITIONING LENGTH 30 (MISCELLANEOUS) ×2 IMPLANT
SPIKE FLUID TRANSFER (MISCELLANEOUS) ×2 IMPLANT
SPONGE T-LAP 18X18 ~~LOC~~+RFID (SPONGE) ×2 IMPLANT
STOPCOCK MORSE 400PSI 3WAY (MISCELLANEOUS) ×4 IMPLANT
SUT PROLENE 6 0 C 1 30 (SUTURE) IMPLANT
SUT SILK  1 MH (SUTURE) ×2
SUT SILK 1 MH (SUTURE) ×2 IMPLANT
SYR 50ML LL SCALE MARK (SYRINGE) ×2 IMPLANT
SYR BULB IRRIG 60ML STRL (SYRINGE) IMPLANT
TOWEL GREEN STERILE (TOWEL DISPOSABLE) ×4 IMPLANT
TRANSDUCER W/STOPCOCK (MISCELLANEOUS) ×4 IMPLANT
TUBING ART PRESS 72  MALE/FEM (TUBING) ×2
TUBING ART PRESS 72 MALE/FEM (TUBING) IMPLANT
TUBING CIL FLEX 10 FLL-RA (TUBING) IMPLANT
VALVE EVOLUT FX 29 (Valve) IMPLANT
WIRE AMPLATZ SS-J .035X180CM (WIRE) IMPLANT
WIRE EMERALD 3MM-J .035X150CM (WIRE) ×2 IMPLANT
WIRE EMERALD 3MM-J .035X260CM (WIRE) ×2 IMPLANT
WIRE EMERALD ST .035X260CM (WIRE) ×4 IMPLANT

## 2022-11-28 NOTE — Progress Notes (Addendum)
LOCATION: right RADIAL  DRESSING APPLIED: gauze with tegaderm  SITE UPON ARRIVAL: LEVEL 0  SITE AFTER BAND REMOVAL: LEVEL 0  CIRCULATION SENSATION AND MOVEMENT: +2 radial pulse noted, + movement noted   COMMENTS: A-line removed by Wilburn Cornelia, RN, this RN present during removal, manual pressure applied for approximately 6 minutes

## 2022-11-28 NOTE — Discharge Planning (Incomplete)
[place] VALVE TEAM  Discharge Summary    Patient ID: PATRICE MATTHEW MRN: [id number]; DOB: 1941/02/12  Admit date: 11/28/2022 Discharge date: 11/29/2022  Primary Care Provider: Glenis Smoker, MD  Primary Cardiologist: Roderic Palau NP / Dr. Angelena Form and Dr. Tenny Craw (TAVR)  Discharge Diagnoses    Principal Problem:   S/P TAVR (transcatheter aortic valve replacement) Active Problems:   HTN (hypertension)   Diabetes (HCC)   Hyperlipidemia   Persistent atrial fibrillation (HCC)   Severe aortic stenosis   LBBB (left bundle branch block)   Allergies Allergies  Allergen Reactions   Tetanus-Diphtheria Toxoids Td Other (See Comments)    Felt very sick   Cardizem [Diltiazem] Rash    Diagnostic Studies/Procedures    TAVR OPERATIVE NOTE     Date of Procedure:                11/28/2022   Preoperative Diagnosis:      Severe Aortic Stenosis    Postoperative Diagnosis:    Same    Procedure:        Transcatheter Aortic Valve Replacement - Transfemoral Approach             Medtronic Evolut Pro THV (size 29 mm, model # [id number], serial # [id number])              Co-Surgeons:                        Lauree Chandler, MD and Justice Rocher, MD    Anesthesiologist:                  Ermalene Postin   Echocardiographer:              Gasper Sells   Pre-operative Echo Findings: Severe aortic stenosis Normal left ventricular systolic function   Post-operative Echo Findings: Mild paravalvular leak Normal left ventricular systolic function _____________    Echo 11/29/22: completed but pending formal read at the time of discharge   History of Present Illness     HELMI HECHAVARRIA is a 81 y.o. female with a history of morbid obesity, arthritis, HTN, DM2 diet controlled, paroxysmal A-fib on eliquis, mild CAD, GERD, CKD III, chronic LE wounds, and severe low flow, low gradient AS who presented to [facility] on 11/28/22 for planned TAVR.   The patient  presented to the ED 05/2020 for chest pain and was found to have moderate aortic stenosis on echo. She been followed for her AS with regular echos since then. Echo on 03/29/22 showed progression to severe AS with mean gradient of 34 mmHg and AVA 0.7. However, she was asymptomatic, so surveillance was recommended. At her cardiology appt on 09/27/22 she had complaints of progressive dyspnea on exertion and fatigue. Echo showed severe calcified AS with a mean gradient of 29.0 mmHg, a peak gradient of 47.3 mmHg, and valve area by VTI 0.92.   The patient has been evaluated by the multidisciplinary valve team and felt to be a suitable candidate for TAVR, which was set up for 11/28/22.    Hospital Course     Consultants: none   Severe AS: s/p successful TAVR with a 29 mm Medtronic Evolut Fx THV via the TF approach on 11/28/22. Post operative echo completed but pending formal read. Groin sites are stable. ECG with new LBBB but no high grade heart block. Resumed on home Eliquis '5mg'$  BID. Walked with cardiac rehab today with no issues.  Plan for discharge home today with close follow up in the office next week.  New LBBB: will dc home with a Zio AT to rule out late presenting HAVB.   HTN: BP moderately controlled. Resume home meds.   Persistent afib: resume home eliquis. Rate well controlled.   CKD stage IIIb: creat remained stable ~1.33  Pulmonary nodules: pre TAVR CT showed small bilateral solid pulmonary nodules, largest measures 3 mm. No follow-up needed if patient is low-risk (and has no known or suspected primary neoplasm). Non-contrast chest CT can be considered in 12 months if patient is high-risk. This will be discussed in the outpatient setting.  _____________  Discharge Vitals Blood pressure (!) 150/51, pulse 84, temperature 97.7 F (36.5 C), temperature source Oral, resp. rate 20, height '5\' 6"'$  (1.676 m), weight 122.6 kg, SpO2 97 %.  Filed Weights   11/27/22 1100 11/28/22 0821 11/29/22 0356   Weight: 123.4 kg 123.4 kg 122.6 kg     GEN: Well nourished, well developed, in no acute distress HEENT: normal Neck: no JVD or masses Cardiac: RRR; sort flow murmurs. No rubs, or gallops,no edema  Respiratory:  clear to auscultation bilaterally, normal work of breathing GI: soft, nontender, nondistended, + BS MS: no deformity or atrophy Skin: warm and dry, no rash.  Groin sites clear without hematoma or ecchymosis  Neuro:  Alert and Oriented x 3, Strength and sensation are intact Psych: euthymic mood, full affect   Labs & Radiologic Studies    CBC Recent Labs    11/28/22 1518 11/29/22 0124  WBC  --  9.3  HGB 11.6* 12.1  HCT 34.0* 36.6  MCV  --  94.3  PLT  --  237   Basic Metabolic Panel Recent Labs    11/28/22 1518 11/29/22 0124  NA 144 142  K 3.4* 3.9  CL 108 107  CO2  --  24  GLUCOSE 163* 153*  BUN 24* 21  CREATININE 1.30* 1.33*  CALCIUM  --  8.5*  MG  --  2.0   Liver Function Tests No results for input(s): "AST", "ALT", "ALKPHOS", "BILITOT", "PROT", "ALBUMIN" in the last 72 hours. No results for input(s): "LIPASE", "AMYLASE" in the last 72 hours. Cardiac Enzymes No results for input(s): "CKTOTAL", "CKMB", "CKMBINDEX", "TROPONINI" in the last 72 hours. BNP Invalid input(s): "POCBNP" D-Dimer No results for input(s): "DDIMER" in the last 72 hours. Hemoglobin A1C No results for input(s): "HGBA1C" in the last 72 hours. Fasting Lipid Panel No results for input(s): "CHOL", "HDL", "LDLCALC", "TRIG", "CHOLHDL", "LDLDIRECT" in the last 72 hours. Thyroid Function Tests No results for input(s): "TSH", "T4TOTAL", "T3FREE", "THYROIDAB" in the last 72 hours.  Invalid input(s): "FREET3" _____________  ECHOCARDIOGRAM LIMITED  Result Date: 11/28/2022    ECHOCARDIOGRAM LIMITED REPORT   Patient Name:   MYEISHA KRUSER Date of Exam: 11/28/2022 Medical Rec #:  [id number]   Height:       66.0 in Accession #:    [id number]  Weight:       272.0 lb Date of Birth:  14-Mar-1941    BSA:          2.279 m Patient Age:    81 years    BP:           164/70 mmHg Patient Gender: F           HR:           77 bpm. Exam Location:  Inpatient Procedure: Limited Color Doppler and Cardiac Doppler Indications:  Aortic stenosis I35.0  History:         Patient has prior history of Echocardiogram examinations, most                  recent 09/27/2022. CAD and Previous Myocardial Infarction,                  Arrythmias:Atrial Fibrillation, Signs/Symptoms:Dyspnea; Risk                  Factors:Diabetes, Hypertension and GERD.  Sonographer:     Bernadene Person RDCS Referring Phys:  Burnell Blanks Diagnosing Phys: Rudean Haskell MD  Sonographer Comments: 60m Medtronic Evolut FX placed. IMPRESSIONS  1. Interventional TTE for TAVR Placement.  2. Prior to procedure there was calcified aortic stenosis. Mean gradient 23 mm Hg, Peak gradient 39 mm Hg, DVI 0.27 without AI. AVA 1.12 cm2 but consistent with moderate to severe aortic stenosis.  3. After procedure a 29 mm Evolute valve placed. Deep depolyment without evidence of chordal interactions or systolic anterior motion of the mitral valve. Mean gradient of 2 mm, Peak gradient of 4 mm Hg with normal EOA and DVI. Ther was mild PVL adjacent to the intraventricular septum.  4. Left ventricular ejection fraction, by estimation, is 60 to 65%. The left ventricle has normal function.  5. Right ventricular systolic function is normal. The right ventricular size is normal.  6. The mitral valve is degenerative. Trivial mitral valve regurgitation. Severe mitral annular calcification. Comparison(s): Successful TAVR Placement. FINDINGS  Left Ventricle: Left ventricular ejection fraction, by estimation, is 60 to 65%. The left ventricle has normal function. Right Ventricle: The right ventricular size is normal. No increase in right ventricular wall thickness. Right ventricular systolic function is normal. Mitral Valve: The mitral valve is degenerative in appearance.  Severe mitral annular calcification. Trivial mitral valve regurgitation. Aortic Valve: Aortic valve mean gradient measures 2.0 mmHg. Aortic valve peak gradient measures 4.2 mmHg. Aortic valve area, by VTI measures 5.52 cm. Additional Comments: Spectral Doppler performed. Color Doppler performed.  LEFT VENTRICLE PLAX 2D LVOT diam:     2.60 cm LV SV:         126 LV SV Index:   55 LVOT Area:     5.31 cm  AORTIC VALVE AV Area (Vmax):    5.99 cm AV Area (Vmean):   5.44 cm AV Area (VTI):     5.52 cm AV Vmax:           102.00 cm/s AV Vmean:          66.700 cm/s AV VTI:            0.229 m AV Peak Grad:      4.2 mmHg AV Mean Grad:      2.0 mmHg LVOT Vmax:         115.00 cm/s LVOT Vmean:        68.400 cm/s LVOT VTI:          0.238 m LVOT/AV VTI ratio: 1.04  SHUNTS Systemic VTI:  0.24 m Systemic Diam: 2.60 cm MRudean HaskellMD Electronically signed by MRudean HaskellMD Signature Date/Time: 11/28/2022/5:10:50 PM    Final    Structural Heart Procedure  Result Date: 11/28/2022 See surgical note for result.  DG Chest 2 View  Result Date: 11/25/2022 CLINICAL DATA:  [id number]-op exam [id number]: CHEST - 2 VIEW COMPARISON:  11/03/2022 FINDINGS: Cardiac silhouette enlarged. No evidence of pneumothorax or pleural effusion. No evidence of pulmonary edema.  There are thoracic degenerative changes. IMPRESSION: Enlarged cardiac silhouette. Electronically Signed   By: Sammie Bench M.D.   On: 11/25/2022 13:09   DG Chest 2 View  Result Date: 11/04/2022 CLINICAL DATA:  Pre-op clearance exam for TAVR. EXAM: CHEST - 2 VIEW COMPARISON:  06/01/2020 FINDINGS: The heart size and mediastinal contours are within normal limits. Both lungs are clear. Mild thoracic spine degenerative changes noted. IMPRESSION: No active cardiopulmonary disease. Electronically Signed   By: Marlaine Hind M.D.   On: 11/04/2022 11:46   Disposition   Pt is being discharged home today in good condition.  Follow-up Plans & Appointments      Follow-up Information     Eileen Stanford, PA-C. Go on 12/08/2022.   Specialties: Cardiology, Radiology Why: @ 11:30pm, please arrive at least 10 min early. Contact information: [address] [place] [address] [phone]                  Discharge Medications   Allergies as of 11/29/2022       Reactions   Tetanus-diphtheria Toxoids Td Other (See Comments)   Felt very sick   Cardizem [diltiazem] Rash        Medication List     TAKE these medications    acetaminophen 500 MG tablet Commonly known as: TYLENOL Take 1,000 mg by mouth every 8 (eight) hours as needed for moderate pain or headache.   amiodarone 200 MG tablet Commonly known as: PACERONE Take 1 tablet (200 mg total) by mouth daily.   Blue-Emu Super Strength Crea Apply 1 application  topically 2 (two) times daily as needed (arthritis).   Calcium 600+D 600-20 MG-MCG Tabs Generic drug: Calcium Carb-Cholecalciferol Take 1 tablet by mouth every evening.   PPJKDTOIZ-12/WPYK EX Apply 1 application topically 2 (two) times daily as needed (itching).   Eliquis 5 MG Tabs tablet Generic drug: apixaban TAKE 1 TABLET BY MOUTH TWICE A DAY   loperamide 2 MG tablet Commonly known as: IMODIUM A-D Take 2-4 mg by mouth daily as needed for diarrhea or loose stools.   metoprolol tartrate 25 MG tablet Commonly known as: LOPRESSOR Take 0.5 tablets (12.5 mg total) by mouth 2 (two) times daily.   multivitamin with minerals Tabs tablet Take 1 tablet by mouth daily. Woman's 50+   phenol 1.4 % Liqd Commonly known as: CHLORASEPTIC Use as directed 1 spray in the mouth or throat at bedtime.   Potassium Chloride ER 20 MEQ Tbcr Take 2 tablets by mouth daily.   PRESERVISION AREDS 2 PO Take 1 capsule by mouth 2 (two) times daily.   torsemide 20 MG tablet Commonly known as: DEMADEX Take 1 tablet (20 mg total) by mouth daily.         Outstanding Labs/Studies   none  Duration  of Discharge Encounter   Greater than 30 minutes including physician time.  Signed, Angelena Form, PA-C,  11/29/2022, 9:46 AM  I have personally seen and examined this patient. I agree with the assessment and plan as outlined above.  Doing well this am. Groins stable. BP stable. Sinus with new LBBB.  Plan Zio at discharge.  Discharge home today after echo.   Lauree Chandler, MD, [facility] 11/29/2022 10:03 AM

## 2022-11-28 NOTE — Interval H&P Note (Signed)
History and Physical Interval Note:  11/28/2022 8:18 AM  [name] [name]  has presented today for surgery, with the diagnosis of Severe Aortic Stenosis.  The various methods of treatment have been discussed with the patient and family. After consideration of risks, benefits and other options for treatment, the patient has consented to  Procedure(s): Transcatheter Aortic Valve Replacement, Transfemoral (N/A) INTRAOPERATIVE TRANSTHORACIC ECHOCARDIOGRAM (N/A) as a surgical intervention.  [name] history has been reviewed, patient examined, no change in status, stable for surgery.  I have reviewed the patient's chart and labs.  Questions were answered to the patient's satisfaction.     Pierre Bali Narcisa Ganesh

## 2022-11-28 NOTE — Progress Notes (Signed)
  [place] VALVE TEAM  Patient doing well s/p TAVR. She is hemodynamically stable. Groin sites stable. ECG with new 1st degree and LBBB. Plan to DC arterial line and transfer to 4E.  Plan for early ambulation after bedrest completed and hopeful discharge over the next 24-48 hours.   Ellsworth Lennox RN, Student Nurse Practitioner

## 2022-11-28 NOTE — Progress Notes (Signed)
Pt noted to have pinkness with dry and flaky skin to bilateral groins and underneath bilateral breasts, remains OTA at this time, safety maintained

## 2022-11-28 NOTE — CV Procedure (Signed)
[facility]  TAVR OPERATIVE NOTE   Date of Procedure:  11/28/2022  Preoperative Diagnosis: Severe Aortic Stenosis   Postoperative Diagnosis: Same   Procedure:   Transcatheter Aortic Valve Replacement - Transfemoral Approach  Medtronic Evolut Pro THV (size 29 mm, model # [id number], serial # [id number])   Co-Surgeons:  Lauree Chandler, MD and Justice Rocher, MD   Anesthesiologist:  Ermalene Postin  Echocardiographer:  Gasper Sells  Pre-operative Echo Findings: Severe aortic stenosis Normal left ventricular systolic function  Post-operative Echo Findings: Mild paravalvular leak Normal left ventricular systolic function  BRIEF CLINICAL NOTE AND INDICATIONS FOR SURGERY  81 yo female with history of arthritis, HTN, diet controlled diabetes, persistent atrial fibrillation, mild CAD, GERD and severe aortic stenosis who is here today for TAVR. Cardiac cath June 2021 with mild non-obstructive CAD. Normal LV filling pressures. She has been followed for moderate to severe aortic stenosis since June 2021. Echo April 2023 with LVEF=60-65%. Normal RV function. Mild MR. Moderate mitral stenosis. Severe AS with mean gradient 34 mmHg, AVA 0.7 cm2, DI 0.25.  She is now having progressive fatigue.   During the course of the patient's preoperative work up they have been evaluated comprehensively by a multidisciplinary team of specialists coordinated through the [facility] in the [facility].  They have been demonstrated to suffer from symptomatic severe aortic stenosis as noted above. The patient has been counseled extensively as to the relative risks and benefits of all options for the treatment of severe aortic stenosis including long term medical therapy, conventional surgery for aortic valve replacement, and transcatheter aortic valve replacement.  The patient has been independently evaluated by Dr. Tenny Craw with CT surgery and they are felt to  be at high risk for conventional surgical aortic valve replacement. The surgeon indicated the patient would be a poor candidate for conventional surgery. Based upon review of all of the patient's preoperative diagnostic tests they are felt to be candidate for transcatheter aortic valve replacement using the transfemoral approach as an alternative to high risk conventional surgery.    Following the decision to proceed with transcatheter aortic valve replacement, a discussion has been held regarding what types of management strategies would be attempted intraoperatively in the event of life-threatening complications, including whether or not the patient would be considered a candidate for the use of cardiopulmonary bypass and/or conversion to open sternotomy for attempted surgical intervention.  The patient has been advised of a variety of complications that might develop peculiar to this approach including but not limited to risks of death, stroke, paravalvular leak, aortic dissection or other major vascular complications, aortic annulus rupture, device embolization, cardiac rupture or perforation, acute myocardial infarction, arrhythmia, heart block or bradycardia requiring permanent pacemaker placement, congestive heart failure, respiratory failure, renal failure, pneumonia, infection, other late complications related to structural valve deterioration or migration, or other complications that might ultimately cause a temporary or permanent loss of functional independence or other long term morbidity.  The patient provides full informed consent for the procedure as described and all questions were answered preoperatively.    DETAILS OF THE OPERATIVE PROCEDURE  PREPARATION:   The patient is brought to the operating room on the above mentioned date and central monitoring was established by the anesthesia team including placement of a radial arterial line. The patient is placed in the supine position on the  operating table.  Intravenous antibiotics are administered. Conscious sedation is used.   Baseline transthoracic echocardiogram was performed.  [name] chest, abdomen, both groins, and both lower extremities are prepared and draped in a sterile manner. A time out procedure is performed.   PERIPHERAL ACCESS:   Using the modified Seldinger technique, femoral arterial and venous access were obtained with placement of a 6 Fr sheath in the artery and a 7 Fr sheath in the vein on the left side using u/s guidance.  A pigtail diagnostic catheter was passed through the femoral arterial sheath under fluoroscopic guidance into the aortic root.  A temporary transvenous pacemaker catheter was passed through the femoral venous sheath under fluoroscopic guidance into the right ventricle.  The pacemaker was tested to ensure stable lead placement and pacemaker capture. Aortic root angiography was performed in order to determine the optimal angiographic angle for valve deployment.  TRANSFEMORAL ACCESS:  A micropuncture kit was used to gain access to the right femoral artery using u/s guidance. Position confirmed with angiography. Pre-closure with double ProGlide closure devices. The patient was heparinized systemically and ACT verified > 250 seconds.    A 8 French sheath was placed in the artery. An AL-1 catheter was used to direct a straight-tip exchange length wire across the native aortic valve into the left ventricle. This was exchanged out for a pigtail catheter and position was confirmed in the LV apex. Simultaneous LV and Ao pressures were recorded.  The pigtail catheter was then exchanged for an Confida wire in the LV apex.   TRANSCATHETER HEART VALVE DEPLOYMENT:  A Medtronic Evolut Pro THV size 29 mm was prepared and per manufacturer's guidelines, and the proper orientation of the valve is confirmed on the delivery system.  The valve system was advanced over the wire with the in-line sheath system. The  valve was then advanced across the aortic arch. The valve was carefully positioned across the aortic valve annulus. Once final position of the valve has been confirmed with angiographic assessment in the cusp overlap view and in the LAO view, the valve is deployed with controlled rapid pacing. The valve is taken to 80% deployment and appropriate depth is confirmed. The valve is then released from each paddle. There is no valvular leak and no central aortic insufficiency.  [name] hemodynamic recovery following valve deployment is good.  Echo demostrated acceptable post-procedural gradients, stable mitral valve function, and mild AI.   PROCEDURE COMPLETION:  The delivery system was then removed and closure devices were completed. Protamine was administered once femoral arterial repair was complete. Manual pressure was applied for 20 minutes. Angiography of the right iliac system demonstrated no dye extravasation. The temporary pacemaker, pigtail catheters and femoral sheaths were removed with a Mynx closure device placed in the artery and manual pressure used for venous hemostasis.    The patient tolerated the procedure well and is transported to the surgical intensive care in stable condition. There were no immediate intraoperative complications. All sponge instrument and needle counts are verified correct at completion of the operation.   No blood products were administered during the operation.  The patient received a total of 50 mL of intravenous contrast during the procedure.  Lauree Chandler MD, [facility] 11/28/2022 2:44 PM

## 2022-11-28 NOTE — H&P (Signed)
[address]       [place], [address]             [phone]                                                   Myrel K [facility] Medical Record #[id number] Date of Birth: [birth year]   Referring: Burnell Blanks* Primary Care: Glenis Smoker, MD Primary Cardiologist: None   Chief Complaint:        Chief Complaint  Patient presents with   Aortic Stenosis      New patient consultation, TAVR, review all studies        History of Present Illness:    [name] [name] 81 y.o. female who presents for TAVR evaluation. She has noted fatigue with the same regimen she used to do a year ago. She has low flow low gradient AS. EF is normal. No active dental issues.            Past Medical History:  Diagnosis Date   Anemia      hx of   Arthritis      knee and shoulders   Atrial fibrillation (HCC)     Carpal tunnel syndrome on right     Coronary artery disease 05/2020    Minimal, nobstructive CAD. Tortuous cororany arteries.   Diabetes mellitus without complication (HCC)      diet controlled no meds in 5-6 yrs   Dyspnea      on exertion    Dysrhythmia 05/2020    afib   GERD (gastroesophageal reflux disease)      No longer taking medications   Hematuria     History of hypothyroidism 30 yrs ago   History of kidney stones     Hypertension     Lower extremity edema      chronic lower extremity edema    Myocardial infarction [facility])      patient mention questionable MI   Renal mass             Past Surgical History:  Procedure Laterality Date   ABDOMINAL HYSTERECTOMY   1992    complete   BILATERAL CARPAL TUNNEL RELEASE   yrs ago   CARDIOVERSION N/A 09/23/2020    Procedure: CARDIOVERSION;  Surgeon: Freada Bergeron, MD;  Location: [place];  Service: Cardiovascular;  Laterality: N/A;   CARDIOVERSION N/A 03/21/2021    Procedure: CARDIOVERSION;  Surgeon: Werner Lean, MD;  Location: [place];  Service: Cardiovascular;   Laterality: N/A;   CHOLECYSTECTOMY   1996   CYSTOSCOPY/URETEROSCOPY/HOLMIUM LASER/STENT PLACEMENT Bilateral 08/19/2020    Procedure: CYSTOSCOPY BILATERAL  RETROGRADE  URETEROSCOPY/HOLMIUM LASER/STENT PLACEMENT;  Surgeon: Irine Seal, MD;  Location: [place];  Service: Urology;  Laterality: Bilateral;   HAMMER TOE SURGERY Right yrs ago   IR RADIOLOGIST EVAL & MGMT   10/26/2020   IR RADIOLOGIST EVAL & MGMT   01/06/2021   IR RADIOLOGIST EVAL & MGMT   03/22/2021   IR RADIOLOGIST EVAL & MGMT   07/06/2021   IR RADIOLOGIST EVAL & MGMT   01/11/2022   LEFT HEART CATH AND CORONARY ANGIOGRAPHY N/A 06/02/2020    Procedure: LEFT HEART CATH AND CORONARY ANGIOGRAPHY;  Surgeon: Jettie Booze, MD;  Location: [place] [facility];  Service: Cardiovascular;  Laterality: N/A;   RADIOLOGY WITH ANESTHESIA N/A 11/17/2020    Procedure: RADIOLOGY WITH ANESTHESIA RENAL MICROWAVE ABLATION;  Surgeon: Suzette Battiest, MD;  Location: [facility];  Service: Radiology;  Laterality: N/A;   TONSILLECTOMY   age 89           Family History  Problem Relation Age of Onset   Hypertension Mother     Hypertension Father     Diabetes Paternal Grandmother          Social History       Tobacco Use  Smoking Status Never  Smokeless Tobacco Never    Social History       Substance and Sexual Activity  Alcohol Use Not Currently             Allergies  Allergen Reactions   Tetanus-Diphtheria Toxoids Td Other (See Comments)      Felt very sick   Cardizem [Diltiazem] Rash            Current Outpatient Medications  Medication Sig Dispense Refill   acetaminophen (TYLENOL) 500 MG tablet Take 1,000 mg by mouth every 6 (six) hours as needed for moderate pain or headache.       amiodarone (PACERONE) 200 MG tablet Take 1 tablet (200 mg total) by mouth daily. 90 tablet 2   Calcium Carb-Cholecalciferol (CALCIUM 600+D) 600-800 MG-UNIT TABS Take 1 tablet by mouth every evening.        ELIQUIS 5 MG TABS tablet TAKE 1  TABLET BY MOUTH TWICE A DAY 60 tablet 6   Hydrocortisone-Aloe Vera (NLGXQJJHE-17/EYCX EX) Apply 1 application topically 2 (two) times daily as needed (itching).       Liniments (BLUE-EMU SUPER STRENGTH) CREA Apply 1 application topically 2 (two) times daily as needed (pain.).        loperamide (IMODIUM A-D) 2 MG tablet Take 2-4 mg by mouth as needed for diarrhea or loose stools.        metoprolol tartrate (LOPRESSOR) 25 MG tablet Take 0.5 tablets (12.5 mg total) by mouth 2 (two) times daily. 90 tablet 3   Multiple Vitamin (MULTIVITAMIN WITH MINERALS) TABS tablet Take 1 tablet by mouth daily.       Multiple Vitamins-Minerals (PRESERVISION AREDS 2 PO) Take 1 capsule by mouth 2 (two) times daily.        Potassium Chloride ER 20 MEQ TBCR Take 2 tablets by mouth daily. 180 tablet 2   torsemide (DEMADEX) 20 MG tablet Take 1 tablet (20 mg total) by mouth daily. 90 tablet 3    No current facility-administered medications for this visit.      ROS 14 point ROS reviewed and negative except as per HPI     PHYSICAL EXAMINATION: BP (!) 159/65 (BP Location: Right Arm, Patient Position: Sitting, Cuff Size: Large)   Pulse 61   Resp 20   Ht '5\' 6"'$  (1.676 m)   Wt 272 lb (123.4 kg)   SpO2 94% Comment: RA  BMI 43.90 kg/m    Gen: NAD Neuro: Alert and oriented Resp: Nonlaboured Abd: Soft, ntnd Extr: WWP   Diagnostic Studies & Laboratory data:     Recent Radiology Findings:    Imaging Results  CT ANGIO CHEST AORTA W/CM & OR WO/CM   Result Date: 10/13/2022 CLINICAL DATA:  Preop TAVR evaluation EXAM: CT ANGIOGRAPHY CHEST, ABDOMEN AND PELVIS TECHNIQUE: Multidetector CT imaging through the chest, abdomen and pelvis was performed using the standard protocol during bolus administration of intravenous contrast. Multiplanar  reconstructed images and MIPs were obtained and reviewed to evaluate the vascular anatomy. RADIATION DOSE REDUCTION: This exam was performed according to the departmental  dose-optimization program which includes automated exposure control, adjustment of the mA and/or kV according to patient size and/or use of iterative reconstruction technique. CONTRAST:  157m OMNIPAQUE IOHEXOL 350 MG/ML SOLN COMPARISON:  CT abdomen dated April 1st 2022 FINDINGS: CTA CHEST FINDINGS Cardiovascular: Cardiomegaly. Aortic valve thickening and calcifications. Mild coronary artery calcifications of the LAD and RCA. No pericardial effusion. Normal caliber thoracic aorta with moderate atherosclerotic disease. No suspicious filling defects of the central pulmonary arteries. Standard three-vessel aortic arch with no significant stenosis. Mediastinum/Nodes: Esophagus and thyroid are unremarkable. No pathologically enlarged lymph nodes seen in the chest. Lungs/Pleura: Central airways are patent. No consolidation, pleural effusion or pneumothorax. Small bilateral solid pulmonary nodules. Reference nodule of the right upper lobe measuring 3 mm on series 5, image 70. Reference nodule of the left lower lobe measuring 2 mm on image 76. Musculoskeletal: No chest wall abnormality. No acute or significant osseous findings. CTA ABDOMEN AND PELVIS FINDINGS Hepatobiliary: No focal liver abnormality is seen. Status post cholecystectomy. No biliary dilatation. Pancreas: Unremarkable. No pancreatic ductal dilatation or surrounding inflammatory changes. Spleen: Normal in size without focal abnormality. Adrenals/Urinary Tract: Bilateral adrenal glands are unremarkable. Post ablation changes of the upper pole of the left kidney. No hydronephrosis or nephrolithiasis. Bladder is unremarkable. Stomach/Bowel: Stomach is within normal limits. Appendix appears normal. Diverticulosis. No evidence of bowel wall thickening, distention, or inflammatory changes. Vascular/lymphatic: Normal caliber thoracic aorta with moderate atherosclerotic disease. No significant stenosis. 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 Reproductive: No adnexal mass. Other: No abdominal wall hernia or abnormality. No abdominopelvic ascites. Musculoskeletal: No acute or significant osseous findings. VASCULAR MEASUREMENTS PERTINENT TO TAVR: AORTA: Minimal Aortic Diameter-12.3 mm Severity of Aortic Calcification-moderate RIGHT PELVIS: Right Common Iliac Artery - Minimal Diameter-8.6 mm Tortuosity-none Calcification-moderate Right External Iliac Artery - Minimal Diameter-6.2 mm Tortuosity-mild Calcification-none Right Common Femoral Artery - Minimal Diameter-7.6 mm Tortuosity-none Calcification-mild LEFT PELVIS: Left Common Iliac Artery - Minimal Diameter-8.4 mm Tortuosity-mild Calcification-mild Left External Iliac Artery - Minimal Diameter-7.0 mm Tortuosity-mild Calcification-none Left Common Femoral Artery - Minimal Diameter-8.4 mm Tortuosity-none Calcification-none Review of the MIP images confirms the above findings. IMPRESSION: 1. Vascular findings and measurements pertinent to potential TAVR procedure, as detailed above. 2. Thickening and calcification of the aortic valve, compatible with reported clinical history of aortic stenosis. 3. Moderate aortoiliac atherosclerosis. Mild 2 vessel coronary artery disease. 4. Small bilateral solid pulmonary nodules, largest measures 3 mm. No follow-up needed if patient is low-risk (and has no known or suspected primary neoplasm). Non-contrast chest CT can be considered in 12 months if patient is high-risk. This recommendation follows the consensus statement: Guidelines for Management of Incidental Pulmonary Nodules Detected on CT Images: From the Fleischner Society 2017; Radiology 2017; 284:228-243. 5. Post ablation changes of the upper pole the left kidney. Electronically Signed   By: LYetta GlassmanM.D.   On: 10/13/2022 15:31    CT ANGIO ABDOMEN PELVIS  W &/OR WO CONTRAST   Result Date: 10/13/2022 CLINICAL DATA:  Preop TAVR evaluation  EXAM: CT ANGIOGRAPHY CHEST, ABDOMEN AND PELVIS TECHNIQUE: Multidetector CT imaging through the chest, abdomen and pelvis was performed using the standard protocol during bolus administration of intravenous contrast. Multiplanar reconstructed images and MIPs were obtained and reviewed to evaluate the vascular anatomy. RADIATION DOSE  REDUCTION: This exam was performed according to the departmental dose-optimization program which includes automated exposure control, adjustment of the mA and/or kV according to patient size and/or use of iterative reconstruction technique. CONTRAST:  168m OMNIPAQUE IOHEXOL 350 MG/ML SOLN COMPARISON:  CT abdomen dated April 1st 2022 FINDINGS: CTA CHEST FINDINGS Cardiovascular: Cardiomegaly. Aortic valve thickening and calcifications. Mild coronary artery calcifications of the LAD and RCA. No pericardial effusion. Normal caliber thoracic aorta with moderate atherosclerotic disease. No suspicious filling defects of the central pulmonary arteries. Standard three-vessel aortic arch with no significant stenosis. Mediastinum/Nodes: Esophagus and thyroid are unremarkable. No pathologically enlarged lymph nodes seen in the chest. Lungs/Pleura: Central airways are patent. No consolidation, pleural effusion or pneumothorax. Small bilateral solid pulmonary nodules. Reference nodule of the right upper lobe measuring 3 mm on series 5, image 70. Reference nodule of the left lower lobe measuring 2 mm on image 76. Musculoskeletal: No chest wall abnormality. No acute or significant osseous findings. CTA ABDOMEN AND PELVIS FINDINGS Hepatobiliary: No focal liver abnormality is seen. Status post cholecystectomy. No biliary dilatation. Pancreas: Unremarkable. No pancreatic ductal dilatation or surrounding inflammatory changes. Spleen: Normal in size without focal abnormality. Adrenals/Urinary Tract: Bilateral adrenal glands are unremarkable. Post ablation changes of the upper pole of the left kidney. No  hydronephrosis or nephrolithiasis. Bladder is unremarkable. Stomach/Bowel: Stomach is within normal limits. Appendix appears normal. Diverticulosis. No evidence of bowel wall thickening, distention, or inflammatory changes. Vascular/lymphatic: Normal caliber thoracic aorta with moderate atherosclerotic disease. No significant stenosis. 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 Reproductive: No adnexal mass. Other: No abdominal wall hernia or abnormality. No abdominopelvic ascites. Musculoskeletal: No acute or significant osseous findings. VASCULAR MEASUREMENTS PERTINENT TO TAVR: AORTA: Minimal Aortic Diameter-12.3 mm Severity of Aortic Calcification-moderate RIGHT PELVIS: Right Common Iliac Artery - Minimal Diameter-8.6 mm Tortuosity-none Calcification-moderate Right External Iliac Artery - Minimal Diameter-6.2 mm Tortuosity-mild Calcification-none Right Common Femoral Artery - Minimal Diameter-7.6 mm Tortuosity-none Calcification-mild LEFT PELVIS: Left Common Iliac Artery - Minimal Diameter-8.4 mm Tortuosity-mild Calcification-mild Left External Iliac Artery - Minimal Diameter-7.0 mm Tortuosity-mild Calcification-none Left Common Femoral Artery - Minimal Diameter-8.4 mm Tortuosity-none Calcification-none Review of the MIP images confirms the above findings. IMPRESSION: 1. Vascular findings and measurements pertinent to potential TAVR procedure, as detailed above. 2. Thickening and calcification of the aortic valve, compatible with reported clinical history of aortic stenosis. 3. Moderate aortoiliac atherosclerosis. Mild 2 vessel coronary artery disease. 4. Small bilateral solid pulmonary nodules, largest measures 3 mm. No follow-up needed if patient is low-risk (and has no known or suspected primary neoplasm). Non-contrast chest CT can be considered in 12 months if patient is high-risk. This recommendation follows the consensus  statement: Guidelines for Management of Incidental Pulmonary Nodules Detected on CT Images: From the Fleischner Society 2017; Radiology 2017; 284:228-243. 5. Post ablation changes of the upper pole the left kidney. Electronically Signed   By: LYetta GlassmanM.D.   On: 10/13/2022 15:31    CT CORONARY MORPH W/CTA COR W/SCORE W/CA W/CM &/OR WO/CM   Addendum Date: 10/13/2022   ADDENDUM REPORT: 10/13/2022 12:03 CLINICAL DATA:  Severe Aortic Stenosis. EXAM: Cardiac TAVR CT TECHNIQUE: A non-contrast, gated CT scan was obtained with axial slices of 3 mm through the heart for aortic valve calcium scoring. A 110 kV retrospective, gated, contrast cardiac scan was obtained. Gantry rotation speed was 250 msecs and collimation was 0.6 mm. Nitroglycerin was not given. The 3D data  set was reconstructed in 5% intervals of the 0-95% of the R-R cycle. Systolic and diastolic phases were analyzed on a dedicated workstation using MPR, MIP, and VRT modes. The patient received 100 cc of contrast. FINDINGS: Image quality: Excellent. Noise artifact is: Limited. Valve Morphology: Tricuspid aortic valve with moderate calcifications. Leaflets are severely thickened with restricted movement in systole. Aortic Valve Calcium score: 569 Aortic annular dimension: Phase assessed: 20% Annular area: 442 mm2 Annular perimeter: 75.7 mm Max diameter: 26.0 mm Min diameter: 22.2 mm Annular and subannular calcification: None. Membranous septum length: 6.7 mm Optimal coplanar projection: LAO 8 CRA 6 Coronary Artery Height above Annulus: Left Main: 13.1 mm Right Coronary: 19.0 mm Sinus of Valsalva Measurements: Non-coronary: 31 mm Right-coronary: 31 mm Left-coronary: 32 mm Sinus of Valsalva Height: Non-coronary: 23.2 mm Right-coronary: 23.7 mm Left-coronary: 20.3 mm Sinotubular Junction: 26 mm Ascending Thoracic Aorta: 30 mm Coronary Arteries: Normal coronary origin. Right dominance. The study was performed without use of NTG and is insufficient for  plaque evaluation. Please refer to recent cardiac catheterization for coronary assessment. Cardiac Morphology: Right Atrium: Right atrial size is dilated. Right Ventricle: The right ventricular cavity is within normal limits. Left Atrium: Left atrial size is dilated with no left atrial appendage filling defect. Small PFO. Left Ventricle: The ventricular cavity size is within normal limits. Pulmonary arteries: Dilated pulmonary artery suggestive of pulmonary hypertension. Pulmonary veins: Normal pulmonary venous drainage. Pericardium: Normal thickness with no significant effusion or calcium present. Mitral Valve: The mitral valve leaflets are thickened with mild annular calcium. Extra-cardiac findings: See attached radiology report for non-cardiac structures. IMPRESSION: 1. Annular measurements appropriate for 26 mm S3 (442 mm2) or 29 mm Evolut Pro. 2. No significant annular or subannular calcifications. 3. Sufficient coronary to annulus distance. 4. Optimal Fluoroscopic Angle for Delivery: LAO 8 CRA 6 5. Dilated pulmonary artery suggestive of pulmonary hypertension. [place] B[place] T. Audie Box, MD Electronically Signed   By: Eleonore Chiquito M.D.   On: 10/13/2022 12:03    Result Date: 10/13/2022 EXAM: OVER-READ INTERPRETATION  CT CHEST The following report is a limited chest CT over-read performed by radiologist Dr. Yetta Glassman of [facility], [place] on 10/13/2022. This over-read does not include interpretation of cardiac or coronary anatomy or pathology. The cardiac TAVR interpretation by the cardiologist is attached. COMPARISON:  None Available. FINDINGS: Extracardiac findings will be described separately under dictation for contemporaneously obtained CTA chest, abdomen and pelvis. IMPRESSION: Please see separate dictation for contemporaneously obtained CTA chest, abdomen and pelvis dated October 13, 2022 for full description of relevant extracardiac findings. Electronically Signed: By: Yetta Glassman M.D. On:  10/13/2022 11:31           I have independently reviewed the above radiology studies  and reviewed the findings with the patient.    Recent Lab Findings: Recent Labs       Lab Results  Component Value Date    WBC 7.5 03/10/2021    HGB 12.3 03/10/2021    HCT 41.1 03/10/2021    PLT 250 03/10/2021    GLUCOSE 122 (H) 09/27/2022    CHOL 184 06/01/2020    TRIG 67 06/01/2020    HDL 51 06/01/2020    LDLCALC 120 (H) 06/01/2020    ALT 21 04/25/2022    AST 26 04/25/2022    NA 144 09/27/2022    K 4.2 09/27/2022    CL 103 09/27/2022    CREATININE 1.39 (H) 09/27/2022    BUN 25 09/27/2022    CO2  23 09/27/2022    TSH 2.220 04/25/2022    INR 1.2 11/17/2020    HGBA1C 6.2 (H) 11/17/2020          Assessment / Plan:   27F with symptomatic low flow low gradient AS. Meets criteria for AV replacement.  Risks/benefits/alternatives and expected length of stay reveiwed. All questions answered.    Valve: 23 vs 26 S3 or 29 Evolut. Area is 444m.  Approach: Femoral Bailout: Yes NYHA: II   I  spent 30 minutes with  the patient face to face in counseling and coordination of care.     DPierre BaliEnter 10/29/2022 7:28 PM

## 2022-11-28 NOTE — Progress Notes (Signed)
Echocardiogram 2D Echocardiogram has been performed.  [name] [name] 11/28/2022, 2:02 PM

## 2022-11-28 NOTE — Transfer of Care (Addendum)
Immediate Anesthesia Transfer of Care Note  Patient: [name] [name]  Procedure(s) Performed: Transcatheter Aortic Valve Replacement, Transfemoral using a Medtronic 29 mm EVOLUT FX Aortic Valve (Chest) INTRAOPERATIVE TRANSTHORACIC ECHOCARDIOGRAM  Patient Location: PACU  Anesthesia Type:MAC  Level of Consciousness: drowsy  Airway & Oxygen Therapy: Patient Spontanous Breathing and Patient connected to nasal cannula oxygen  Post-op Assessment: Report given to RN and Post -op Vital signs reviewed and stable  Post vital signs: Reviewed and stable  Last Vitals:  Vitals Value Taken Time  BP 114/47   Temp    Pulse 61   Resp    SpO2 95     Last Pain:  Vitals:   11/28/22 0904  TempSrc:   PainSc: 0-No pain      Patients Stated Pain Goal: 0 (45/14/60 4799)  Complications: No notable events documented.

## 2022-11-28 NOTE — Progress Notes (Signed)
RN noted increased oozing to right groin site. Dr. Angelena Form arrived to bedside and aware. RN removed old dressing, applied approximately 5 minutes of manual pressure and applied new dressing per MD.

## 2022-11-28 NOTE — Progress Notes (Signed)
Katie T., PA at bedside, aware of SBP in 80's, no new orders obtained at this time

## 2022-11-28 NOTE — Anesthesia Procedure Notes (Signed)
Arterial Line Insertion Start/End12/11/2022 9:30 AM, 11/28/2022 9:35 AM Performed by: Betha Loa, CRNA, CRNA  Patient location: Pre-op. Preanesthetic checklist: patient identified, IV checked, site marked, risks and benefits discussed, surgical consent, monitors and equipment checked, pre-op evaluation, timeout performed and anesthesia consent Lidocaine 1% used for infiltration Right, radial was placed Catheter size: 20 G Hand hygiene performed  and maximum sterile barriers used   Attempts: 1 Procedure performed without using ultrasound guided technique. Following insertion, dressing applied and Biopatch. Post procedure assessment: normal  Patient tolerated the procedure well with no immediate complications.

## 2022-11-28 NOTE — Op Note (Signed)
OPERATIVE NOTE: Patient Name: [name] [name] Date of Birth: 2041-03-06 Date of Operation: 11/28/22  PRE-OPERATIVE DIAGNOSIS: Severe, Symptomatic Aortic Stenosis   POST-OPERATIVE DIAGNOSIS: Same   OPERATION: Transfemoral Evolut 18m Aortic Valve Placement   SURGEON: [name] BaliEnter MD   Co-Surgeon: [name] Blanks MD    EBL 10cc   FINDINGS: Reasonable implant depth, mildly deep. Not impinging on mitral valve function Mild perivalvular AI   SPECIMENS: None   COMPLICATIONS: None   TUBES:  None   PROCEDURE IN DETAIL: The patient was brought in the operating room and laid in supine position.  The patient was prepped and draped in standard fashion. Arterial and venous lines were placed by anesthesia along under sedation. After a timeout was performed, right and left transfemoral access was gained with ultrasound guidance. The venous access was used to advance a temporary pacer into the right heart.    On the left femoral artery, a 6Fr sheath was placed and wire and pigtail through this into the noncoronary cusp.    We then used micropuncture techniques and ultrasound to approach the right groin and gained access and placed 2 Perclose devices. Via an 8 Fr sheath an Amplatz wire was placed. The patient was heparinized systemically and ACT verified > 250 seconds.   Then an AL caltheter was used with flexible straight tip to get into the LV. No BAV performed. Then exchanged over a pigtail for a pre-curled wire.    Evolut 241mFX valve was advanced into the aorta after being flourscopically checked. The valve was then advanced across the aortic arch using appropriate flexion of the catheter. The valve was carefully positioned across the aortic valve annulus.. Once final position of the valve has been confirmed by angiographic assessment, the valve is deployed while temporarily holding ventilation and during rapid ventricular pacing to maintain systolic blood pressure < 50 mmHg and  pulse pressure < 10 mmHg. Valve function is assessed using TTE. There is felt to be mild paravalvular leak and no central aortic insufficiency.  [name] hemodynamic recovery following valve deployment is good.  Echo demostrated acceptable post-procedural gradients, stable mitral valve function, and no AI.  Implant depth was excellent. Hemodynamics were good. We then removed the sheath and deployed perclose x 2. Protamine was administered. The first perclose broke and we placed a 3rd perclose. Patient was in sinus rhythm despite a mildly deep implant. There was not and indication for further BAV given the echo valve appearance and we had sinus rhythm.    Sheaths were removed and closure devices had been completed. I defer to Dr. McAngelena [name] closure of the left femoral artery   The patient had a stable status and was transferred to the postoperative care unit in stable condition. All surgical counts were correct.

## 2022-11-28 NOTE — Discharge Instructions (Signed)

## 2022-11-28 NOTE — Anesthesia Procedure Notes (Signed)
Procedure Name: MAC Date/Time: 11/28/2022 12:30 PM  Performed by: Carolan Clines, CRNAPre-anesthesia Checklist: Patient identified, Emergency Drugs available, Suction available and Patient being monitored Patient Re-evaluated:Patient Re-evaluated prior to induction Oxygen Delivery Method: Simple face mask Dental Injury: Teeth and Oropharynx as per pre-operative assessment

## 2022-11-28 NOTE — Interval H&P Note (Signed)
History and Physical Interval Note:  11/28/2022 10:03 AM  [name] [name]  has presented today for surgery, with the diagnosis of Severe Aortic Stenosis.  The various methods of treatment have been discussed with the patient and family. After consideration of risks, benefits and other options for treatment, the patient has consented to  Procedure(s): Transcatheter Aortic Valve Replacement, Transfemoral (N/A) INTRAOPERATIVE TRANSTHORACIC ECHOCARDIOGRAM (N/A) as a surgical intervention.  [name] history has been reviewed, patient examined, no change in status, stable for surgery.  I have reviewed the patient's chart and labs.  Questions were answered to the patient's satisfaction.     Pierre Bali Axten Pascucci

## 2022-11-29 ENCOUNTER — Inpatient Hospital Stay (HOSPITAL_COMMUNITY): Payer: Medicare Other

## 2022-11-29 ENCOUNTER — Inpatient Hospital Stay (HOSPITAL_BASED_OUTPATIENT_CLINIC_OR_DEPARTMENT_OTHER)
Admit: 2022-11-29 | Discharge: 2022-11-29 | Disposition: A | Discharge status: Home / Self Care | Payer: Medicare Other | Attending: Physician Assistant | Admitting: Physician Assistant

## 2022-11-29 ENCOUNTER — Encounter (HOSPITAL_COMMUNITY): Payer: Self-pay | Admitting: Cardiovascular Disease

## 2022-11-29 DIAGNOSIS — E1122 Type 2 diabetes mellitus with diabetic chronic kidney disease: Secondary | ICD-10-CM | POA: Diagnosis not present

## 2022-11-29 DIAGNOSIS — Z952 Presence of prosthetic heart valve: Secondary | ICD-10-CM

## 2022-11-29 DIAGNOSIS — Z006 Encounter for examination for normal comparison and control in clinical research program: Secondary | ICD-10-CM | POA: Diagnosis not present

## 2022-11-29 DIAGNOSIS — I4819 Other persistent atrial fibrillation: Secondary | ICD-10-CM | POA: Diagnosis not present

## 2022-11-29 DIAGNOSIS — I447 Left bundle-branch block, unspecified: Secondary | ICD-10-CM | POA: Diagnosis not present

## 2022-11-29 DIAGNOSIS — I350 Nonrheumatic aortic (valve) stenosis: Secondary | ICD-10-CM | POA: Diagnosis not present

## 2022-11-29 LAB — CBC
HCT: 36.6 % (ref 36.0–46.0)
Hemoglobin: 12.1 g/dL (ref 12.0–15.0)
MCH: 31.2 pg (ref 26.0–34.0)
MCHC: 33.1 g/dL (ref 30.0–36.0)
MCV: 94.3 fL (ref 80.0–100.0)
Platelets: 164 10*3/uL (ref 150–400)
RBC: 3.88 MIL/uL (ref 3.87–5.11)
RDW: 15.9 % — ABNORMAL HIGH (ref 11.5–15.5)
WBC: 9.3 10*3/uL (ref 4.0–10.5)
nRBC: 0 % (ref 0.0–0.2)

## 2022-11-29 LAB — ECHOCARDIOGRAM COMPLETE
AR max vel: 2.21 cm2
AV Area VTI: 2.23 cm2
AV Area mean vel: 2.32 cm2
AV Mean grad: 15 mmHg
AV Peak grad: 28.9 mmHg
Ao pk vel: 2.69 m/s
Area-P 1/2: 2.37 cm2
Height: 66 in
MV VTI: 1.98 cm2
P 1/2 time: 2459 msec
S' Lateral: 3.03 cm
Weight: 4323.2 oz

## 2022-11-29 LAB — BASIC METABOLIC PANEL
Anion gap: 11 (ref 5–15)
BUN: 21 mg/dL (ref 8–23)
CO2: 24 mmol/L (ref 22–32)
Calcium: 8.5 mg/dL — ABNORMAL LOW (ref 8.9–10.3)
Chloride: 107 mmol/L (ref 98–111)
Creatinine, Ser: 1.33 mg/dL — ABNORMAL HIGH (ref 0.44–1.00)
GFR, Estimated: 41 mL/min — ABNORMAL LOW (ref >60)
Glucose, Bld: 153 mg/dL — ABNORMAL HIGH (ref 70–99)
Potassium: 3.9 mmol/L (ref 3.5–5.1)
Sodium: 142 mmol/L (ref 135–145)

## 2022-11-29 LAB — MAGNESIUM: Magnesium: 2 mg/dL (ref 1.7–2.4)

## 2022-11-29 NOTE — Progress Notes (Signed)
Nursing DC note  Patient alert and oriented. Vss. Denies cp or sob. Verbalized understanding of dc instructions. All dc papers and belongings given to patient.

## 2022-11-29 NOTE — Care Management Important Message (Deleted)
Important Message  Patient Details  Name: [name] [name] MRN: [id number] Date of Birth: Nov 18, 1941   Medicare Important Message Given:  Yes     [name] 11/29/2022, 11:59 AM

## 2022-11-29 NOTE — Plan of Care (Signed)
  Problem: Education: Goal: Knowledge of General Education information will improve Description: Including pain rating scale, medication(s)/side effects and non-pharmacologic comfort measures Outcome: Adequate for Discharge   Problem: Health Behavior/Discharge Planning: Goal: Ability to manage health-related needs will improve Outcome: Adequate for Discharge   Problem: Clinical Measurements: Goal: Ability to maintain clinical measurements within normal limits will improve Outcome: Adequate for Discharge Goal: Will remain free from infection Outcome: Adequate for Discharge Goal: Diagnostic test results will improve Outcome: Adequate for Discharge Goal: Respiratory complications will improve Outcome: Adequate for Discharge Goal: Cardiovascular complication will be avoided Outcome: Adequate for Discharge   Problem: Cardiovascular: Goal: Ability to achieve and maintain adequate cardiovascular perfusion will improve Outcome: Adequate for Discharge Goal: Vascular access site(s) Level 0-1 will be maintained Outcome: Adequate for Discharge   Problem: Health Behavior/Discharge Planning: Goal: Ability to safely manage health-related needs after discharge will improve Outcome: Adequate for Discharge   Problem: Education: Goal: Understanding of CV disease, CV risk reduction, and recovery process will improve Outcome: Adequate for Discharge Goal: Individualized Educational Video(s) Outcome: Adequate for Discharge

## 2022-11-29 NOTE — Progress Notes (Signed)
  Echocardiogram 2D Echocardiogram has been performed.  [name] [name] 11/29/2022, 9:43 AM

## 2022-11-29 NOTE — Progress Notes (Signed)
CARDIAC REHAB PHASE I   Post TAVR education including restrictions, risk factors, heart healthy diet, site care, exercise guidelines and CRP2 reviewed. All questions and concerns addressed. Pt not interested in CRP2 at this time. Plan for discharge today.   [id number]  Vanessa Barbara, RN BSN 11/29/2022 12:39 PM

## 2022-11-29 NOTE — Anesthesia Preprocedure Evaluation (Addendum)
Anesthesia Evaluation  Patient identified by MRN, date of birth, ID band Patient awake    Reviewed: Allergy & Precautions, NPO status , Patient's Chart, lab work & pertinent test results  Airway Mallampati: III  TM Distance: >3 FB Neck ROM: Full    Dental  (+) Dental Advisory Given   Pulmonary shortness of breath   breath sounds clear to auscultation       Cardiovascular hypertension, Pt. on medications and Pt. on home beta blockers + CAD and + Past MI  + dysrhythmias + Valvular Problems/Murmurs AS  Rhythm:Regular + Systolic murmurs    Neuro/Psych neg Seizures  Neuromuscular disease  negative psych ROS   GI/Hepatic Neg liver ROS,GERD  ,,  Endo/Other  diabetes    Renal/GU negative Renal ROS     Musculoskeletal  (+) Arthritis ,    Abdominal   Peds  Hematology negative hematology ROS (+)   Anesthesia Other Findings   Reproductive/Obstetrics                              Anesthesia Physical Anesthesia Plan  ASA: 4  Anesthesia Plan: MAC   Post-op Pain Management: Minimal or no pain anticipated   Induction: Intravenous  PONV Risk Score and Plan: 2 and Propofol infusion and Treatment may vary due to age or medical condition  Airway Management Planned: Nasal Cannula, Natural Airway and Simple Face Mask  Additional Equipment: Arterial line  Intra-op Plan:   Post-operative Plan:   Informed Consent: I have reviewed the patients History and Physical, chart, labs and discussed the procedure including the risks, benefits and alternatives for the proposed anesthesia with the patient or authorized representative who has indicated his/her understanding and acceptance.     Dental advisory given  Plan Discussed with: CRNA  Anesthesia Plan Comments:        Anesthesia Quick Evaluation

## 2022-11-29 NOTE — TOC Transition Note (Signed)
Transition of Care [facility]) - CM/SW Discharge Note   Patient Details  Name: [name] [name] MRN: [id number] Date of Birth: January 18, 1941  Transition of Care [facility]) CM/SW Contact:  Tom-Johnson, Renea Ee, RN Phone Number: 11/29/2022, 12:09 PM   Clinical Narrative:     Patient is scheduled for discharge today. No TOC needs or recommendations noted. Family to transport at discharge. No further TOC needs noted.     Final next level of care: Home/Self Care Barriers to Discharge: Barriers Resolved   Patient Goals and CMS Choice Patient states their goals for this hospitalization and ongoing recovery are:: To return home CMS Medicare.gov Compare Post Acute Care list provided to:: Patient Choice offered to / list presented to : NA  Discharge Placement                Patient to be transferred to facility by: Family      Discharge Plan and Services                DME Arranged: N/A DME Agency: NA       HH Arranged: NA HH Agency: NA        Social Determinants of Health (SDOH) Interventions Transportation Interventions: Intervention Not Indicated, Inpatient TOC, Patient Resources (Friends/Family)   Readmission Risk Interventions     No data to display

## 2022-11-29 NOTE — Anesthesia Postprocedure Evaluation (Signed)
Anesthesia Post Note  Patient: [name] [name]  Procedure(s) Performed: Transcatheter Aortic Valve Replacement, Transfemoral using a Medtronic 29 mm EVOLUT FX Aortic Valve (Chest) INTRAOPERATIVE TRANSTHORACIC ECHOCARDIOGRAM     Patient location during evaluation: Cath Lab Anesthesia Type: MAC Level of consciousness: awake and alert Pain management: pain level controlled Vital Signs Assessment: post-procedure vital signs reviewed and stable Respiratory status: spontaneous breathing, nonlabored ventilation, respiratory function stable and patient connected to nasal cannula oxygen Cardiovascular status: stable and blood pressure returned to baseline Postop Assessment: no apparent nausea or vomiting Anesthetic complications: no  No notable events documented.  Last Vitals:  Vitals:   11/29/22 0356 11/29/22 0730  BP: (!) 148/49 (!) 150/51  Pulse: 64 84  Resp: 16 20  Temp: 36.6 C 36.5 C  SpO2: 96% 97%    Last Pain:  Vitals:   11/29/22 0730  TempSrc: Oral  PainSc:                  Kasson Lamere

## 2022-11-29 NOTE — Progress Notes (Signed)
Mobility Specialist Progress Note:   11/29/22 0848  Mobility  Activity Ambulated with assistance in hallway  Level of Assistance Standby assist, set-up cues, supervision of patient - no hands on  Assistive Device None  Distance Ambulated (ft) 80 ft  Activity Response Tolerated well  $Mobility charge 1 Mobility   During Mobility: 116 HR Post Mobility:   77 HR  Pt received in bed willing to participate in mobility. No complaints of pain.  Left in bed with call bell in reach, all needs met and MD present.   Gareth Eagle Elise Gladden Mobility Specialist Please contact via [facility] [employer] or  Rehab Office at [phone]

## 2022-11-30 ENCOUNTER — Telehealth: Payer: Self-pay | Admitting: Physician Assistant

## 2022-11-30 DIAGNOSIS — Z952 Presence of prosthetic heart valve: Secondary | ICD-10-CM | POA: Diagnosis not present

## 2022-11-30 DIAGNOSIS — I447 Left bundle-branch block, unspecified: Secondary | ICD-10-CM | POA: Diagnosis not present

## 2022-11-30 NOTE — Telephone Encounter (Signed)
  [facility] VALVE TEAM   Patient contacted regarding discharge from [facility] on 12/13  Patient understands to follow up with a structural heart APP on 12/22 at [place].  Patient understands discharge instructions? yes Patient understands medications and regimen? yes Patient understands to bring all medications to this visit? yes  Angelena Form PA-C

## 2022-11-30 NOTE — Discharge Summary (Signed)
LATE ENTRY. ORIGINAL DC SUMMARY ENTERED AS "DISCHARGE PLANNING" ON 11/29/22     [facility] VALVE TEAM   Discharge Summary    Patient ID: [name] [name] MRN: [id number]; DOB: 1941/06/25   Admit date: 11/28/2022 Discharge date: 11/29/2022   Primary Care Provider: Glenis Smoker, MD  Primary Cardiologist: Roderic Palau NP / Dr. Angelena Form and Dr. Tenny Craw (TAVR)   Discharge Diagnoses    Principal Problem:   S/P TAVR (transcatheter aortic valve replacement) Active Problems:   HTN (hypertension)   Diabetes (HCC)   Hyperlipidemia   Persistent atrial fibrillation (HCC)   Severe aortic stenosis   LBBB (left bundle branch block)     Allergies      Allergies  Allergen Reactions   Tetanus-Diphtheria Toxoids Td Other (See Comments)      Felt very sick   Cardizem [Diltiazem] Rash      Diagnostic Studies/Procedures    TAVR OPERATIVE NOTE     Date of Procedure:                11/28/2022   Preoperative Diagnosis:      Severe Aortic Stenosis    Postoperative Diagnosis:    Same    Procedure:        Transcatheter Aortic Valve Replacement - Transfemoral Approach             Medtronic Evolut Pro THV (size 29 mm, model # [id number], serial # [id number])              Co-Surgeons:                        Lauree Chandler, MD and Justice Rocher, MD    Anesthesiologist:                  Ermalene Postin   Echocardiographer:              Gasper Sells   Pre-operative Echo Findings: Severe aortic stenosis Normal left ventricular systolic function   Post-operative Echo Findings: Mild paravalvular leak Normal left ventricular systolic function _____________     Echo 11/29/22: completed but pending formal read at the time of discharge    History of Present Illness     [name] [name] is a 81 y.o. female with a history of morbid obesity, arthritis, HTN, DM2 diet controlled, paroxysmal A-fib on eliquis, mild CAD, GERD, CKD III, chronic LE wounds, and  severe low flow, low gradient AS who presented to [facility] on 11/28/22 for planned TAVR.    The patient presented to the ED 05/2020 for chest pain and was found to have moderate aortic stenosis on echo. She been followed for her AS with regular echos since then. Echo on 03/29/22 showed progression to severe AS with mean gradient of 34 mmHg and AVA 0.7. However, she was asymptomatic, so surveillance was recommended. At her cardiology appt on 09/27/22 she had complaints of progressive dyspnea on exertion and fatigue. Echo showed severe calcified AS with a mean gradient of 29.0 mmHg, a peak gradient of 47.3 mmHg, and valve area by VTI 0.92.    The patient has been evaluated by the multidisciplinary valve team and felt to be a suitable candidate for TAVR, which was set up for 11/28/22.     Hospital Course     Consultants: none    Severe AS: s/p successful TAVR with a 29 mm Medtronic Evolut Fx THV via the TF approach on 11/28/22.  Post operative echo completed but pending formal read. Groin sites are stable. ECG with new LBBB but no high grade heart block. Resumed on home Eliquis '5mg'$  BID. Walked with cardiac rehab today with no issues. Plan for discharge home today with close follow up in the office next week.   New LBBB: will dc home with a Zio AT to rule out late presenting HAVB.    HTN: BP moderately controlled. Resume home meds.    Persistent afib: resume home eliquis. Rate well controlled.    CKD stage IIIb: creat remained stable ~1.33   Pulmonary nodules: pre TAVR CT showed small bilateral solid pulmonary nodules, largest measures 3 mm. No follow-up needed if patient is low-risk (and has no known or suspected primary neoplasm). Non-contrast chest CT can be considered in 12 months if patient is high-risk. This will be discussed in the outpatient setting.  _____________   Discharge Vitals Blood pressure (!) 150/51, pulse 84, temperature 97.7 F (36.5 C), temperature source Oral, resp. rate 20, height  '5\' 6"'$  (1.676 m), weight 122.6 kg, SpO2 97 %.       Filed Weights    11/27/22 1100 11/28/22 0821 11/29/22 0356  Weight: 123.4 kg 123.4 kg 122.6 kg        GEN: Well nourished, well developed, in no acute distress HEENT: normal Neck: no JVD or masses Cardiac: RRR; sort flow murmurs. No rubs, or gallops,no edema  Respiratory:  clear to auscultation bilaterally, normal work of breathing GI: soft, nontender, nondistended, + BS MS: no deformity or atrophy Skin: warm and dry, no rash.  Groin sites clear without hematoma or ecchymosis  Neuro:  Alert and Oriented x 3, Strength and sensation are intact Psych: euthymic mood, full affect     Labs & Radiologic Studies    CBC Recent Labs (last 2 labs)      Recent Labs    11/28/22 1518 11/29/22 0124  WBC  --  9.3  HGB 11.6* 12.1  HCT 34.0* 36.6  MCV  --  94.3  PLT  --  164      Basic Metabolic Panel Recent Labs (last 2 labs)      Recent Labs    11/28/22 1518 11/29/22 0124  NA 144 142  K 3.4* 3.9  CL 108 107  CO2  --  24  GLUCOSE 163* 153*  BUN 24* 21  CREATININE 1.30* 1.33*  CALCIUM  --  8.5*  MG  --  2.0      Liver Function Tests Recent Labs (last 2 labs)  No results for input(s): "AST", "ALT", "ALKPHOS", "BILITOT", "PROT", "ALBUMIN" in the last 72 hours.   Recent Labs (last 2 labs)  No results for input(s): "LIPASE", "AMYLASE" in the last 72 hours.   Cardiac Enzymes Recent Labs (last 2 labs)  No results for input(s): "CKTOTAL", "CKMB", "CKMBINDEX", "TROPONINI" in the last 72 hours.   BNP Recent Labs (last 2 labs)  Invalid input(s): "POCBNP"   D-Dimer Recent Labs (last 2 labs)  No results for input(s): "DDIMER" in the last 72 hours.   Hemoglobin A1C Recent Labs (last 2 labs)  No results for input(s): "HGBA1C" in the last 72 hours.   Fasting Lipid Panel Recent Labs (last 2 labs)  No results for input(s): "CHOL", "HDL", "LDLCALC", "TRIG", "CHOLHDL", "LDLDIRECT" in the last 72 hours.   Thyroid Function  Tests  Recent Labs (last 2 labs)  No results for input(s): "TSH", "T4TOTAL", "T3FREE", "THYROIDAB" in the last 72 hours.  Invalid input(s): "FREET3"   _____________   Imaging Results  ECHOCARDIOGRAM LIMITED   Result Date: 11/28/2022    ECHOCARDIOGRAM LIMITED REPORT   Patient Name:   [name] [name] Date of Exam: 11/28/2022 Medical Rec #:  [id number]   Height:       66.0 in Accession #:    [id number]  Weight:       272.0 lb Date of Birth:  11-07-1941   BSA:          2.279 m Patient Age:    81 years    BP:           164/70 mmHg Patient Gender: F           HR:           77 bpm. Exam Location:  Inpatient Procedure: Limited Color Doppler and Cardiac Doppler Indications:     Aortic stenosis I35.0  History:         Patient has prior history of Echocardiogram examinations, most                  recent 09/27/2022. CAD and Previous Myocardial Infarction,                  Arrythmias:Atrial Fibrillation, Signs/Symptoms:Dyspnea; Risk                  Factors:Diabetes, Hypertension and GERD.  Sonographer:     Bernadene Person RDCS Referring Phys:  Burnell Blanks Diagnosing Phys: Rudean Haskell MD  Sonographer Comments: 31m Medtronic Evolut FX placed. IMPRESSIONS  1. Interventional TTE for TAVR Placement.  2. Prior to procedure there was calcified aortic stenosis. Mean gradient 23 mm Hg, Peak gradient 39 mm Hg, DVI 0.27 without AI. AVA 1.12 cm2 but consistent with moderate to severe aortic stenosis.  3. After procedure a 29 mm Evolute valve placed. Deep depolyment without evidence of chordal interactions or systolic anterior motion of the mitral valve. Mean gradient of 2 mm, Peak gradient of 4 mm Hg with normal EOA and DVI. Ther was mild PVL adjacent to the intraventricular septum.  4. Left ventricular ejection fraction, by estimation, is 60 to 65%. The left ventricle has normal function.  5. Right ventricular systolic function is normal. The right ventricular size is normal.  6. The mitral valve is  degenerative. Trivial mitral valve regurgitation. Severe mitral annular calcification. Comparison(s): Successful TAVR Placement. FINDINGS  Left Ventricle: Left ventricular ejection fraction, by estimation, is 60 to 65%. The left ventricle has normal function. Right Ventricle: The right ventricular size is normal. No increase in right ventricular wall thickness. Right ventricular systolic function is normal. Mitral Valve: The mitral valve is degenerative in appearance. Severe mitral annular calcification. Trivial mitral valve regurgitation. Aortic Valve: Aortic valve mean gradient measures 2.0 mmHg. Aortic valve peak gradient measures 4.2 mmHg. Aortic valve area, by VTI measures 5.52 cm. Additional Comments: Spectral Doppler performed. Color Doppler performed.  LEFT VENTRICLE PLAX 2D LVOT diam:     2.60 cm LV SV:         126 LV SV Index:   55 LVOT Area:     5.31 cm  AORTIC VALVE AV Area (Vmax):    5.99 cm AV Area (Vmean):   5.44 cm AV Area (VTI):     5.52 cm AV Vmax:           102.00 cm/s AV Vmean:          66.700 cm/s AV VTI:  0.229 m AV Peak Grad:      4.2 mmHg AV Mean Grad:      2.0 mmHg LVOT Vmax:         115.00 cm/s LVOT Vmean:        68.400 cm/s LVOT VTI:          0.238 m LVOT/AV VTI ratio: 1.04  SHUNTS Systemic VTI:  0.24 m Systemic Diam: 2.60 cm Rudean Haskell MD Electronically signed by Rudean Haskell MD Signature Date/Time: 11/28/2022/5:10:50 PM    Final     Structural Heart Procedure   Result Date: 11/28/2022 See surgical note for result.   DG Chest 2 View   Result Date: 11/25/2022 CLINICAL DATA:  [id number] Pre-op exam [id number] EXAM: CHEST - 2 VIEW COMPARISON:  11/03/2022 FINDINGS: Cardiac silhouette enlarged. No evidence of pneumothorax or pleural effusion. No evidence of pulmonary edema. There are thoracic degenerative changes. IMPRESSION: Enlarged cardiac silhouette. Electronically Signed   By: Sammie Bench M.D.   On: 11/25/2022 13:09    DG Chest 2 View   Result  Date: 11/04/2022 CLINICAL DATA:  Pre-op clearance exam for TAVR. EXAM: CHEST - 2 VIEW COMPARISON:  06/01/2020 FINDINGS: The heart size and mediastinal contours are within normal limits. Both lungs are clear. Mild thoracic spine degenerative changes noted. IMPRESSION: No active cardiopulmonary disease. Electronically Signed   By: Marlaine Hind M.D.   On: 11/04/2022 11:46     Disposition    Pt is being discharged home today in good condition.   Follow-up Plans & Appointments        Follow-up Information       Eileen Stanford, PA-C. Go on 12/08/2022.   Specialties: Cardiology, Radiology Why: @ 11:30pm, please arrive at least 10 min early. Contact information: [address] [place] [address] [phone]                            Discharge Medications    Allergies as of 11/29/2022         Reactions    Tetanus-diphtheria Toxoids Td Other (See Comments)    Felt very sick    Cardizem [diltiazem] Rash            Medication List       TAKE these medications     acetaminophen 500 MG tablet Commonly known as: TYLENOL Take 1,000 mg by mouth every 8 (eight) hours as needed for moderate pain or headache.    amiodarone 200 MG tablet Commonly known as: PACERONE Take 1 tablet (200 mg total) by mouth daily.    Blue-Emu Super Strength Crea Apply 1 application  topically 2 (two) times daily as needed (arthritis).    Calcium 600+D 600-20 MG-MCG Tabs Generic drug: Calcium Carb-Cholecalciferol Take 1 tablet by mouth every evening.    FKCLEXNTZ-00/FVCB EX Apply 1 application topically 2 (two) times daily as needed (itching).    Eliquis 5 MG Tabs tablet Generic drug: apixaban TAKE 1 TABLET BY MOUTH TWICE A DAY    loperamide 2 MG tablet Commonly known as: IMODIUM A-D Take 2-4 mg by mouth daily as needed for diarrhea or loose stools.    metoprolol tartrate 25 MG tablet Commonly known as: LOPRESSOR Take 0.5 tablets (12.5 mg total) by mouth 2  (two) times daily.    multivitamin with minerals Tabs tablet Take 1 tablet by mouth daily. Woman's 50+    phenol 1.4 % Liqd Commonly known as: CHLORASEPTIC Use  as directed 1 spray in the mouth or throat at bedtime.    Potassium Chloride ER 20 MEQ Tbcr Take 2 tablets by mouth daily.    PRESERVISION AREDS 2 PO Take 1 capsule by mouth 2 (two) times daily.    torsemide 20 MG tablet Commonly known as: DEMADEX Take 1 tablet (20 mg total) by mouth daily.             Outstanding Labs/Studies    none   Duration of Discharge Encounter    Greater than 30 minutes including physician time.   Signed, Angelena Form, PA-C,  11/29/2022, 9:46 AM   I have personally seen and examined this patient. I agree with the assessment and plan as outlined above.  Doing well this am. Groins stable. BP stable. Sinus with new LBBB.  Plan Zio at discharge.  Discharge home today after echo.    Lauree Chandler, MD, [facility] 11/29/2022 10:03 AM

## 2022-12-01 ENCOUNTER — Telehealth: Payer: Self-pay | Admitting: Cardiovascular Disease

## 2022-12-01 NOTE — Telephone Encounter (Signed)
Ronnie with [employer] calling with critical EKG results

## 2022-12-01 NOTE — Telephone Encounter (Signed)
   Cardiac Monitor Alert  Date of alert:  12/01/2022   Patient Name: [name] [name]  DOB: 09/07/1941  MRN: [id number]   [place] Cardiologist: None  [facility] EP:  Vickie Epley, MD    Monitor Information: Long Term Monitor-Live Telemetry [ZioAT]  Reason:  s/p TAVR Ordering provider:  Nell Range   Alert High grade AV Block 28 bpm at 7:28 AM, with underlying Mobitz type I 64-62 bpm.  This is the 1st alert for this rhythm.   Next Cardiology Appointment   Date:  12/08/22  Provider:  Nell Range  Spoke w/ Nell Range who stated she would review and call patient. Arrhythmia, symptoms and history reviewed with Nell Range.   Plan:  Provider to assess.     Joni Reining, RN  12/01/2022 12:03 PM

## 2022-12-01 NOTE — Telephone Encounter (Signed)
Note from Nell Range PA-C: Pt had a new LBBB after TAVR and monitor was placed. She had a short duration of HAVB on monitor early this AM when she was asleep. She is feeling fine. I told her to continue not driving and let us know if she develops any symptoms of pre-syncope/syncope to go ER. Otherwise will continue to monitor.    KT

## 2022-12-03 ENCOUNTER — Telehealth: Payer: Self-pay | Admitting: Cardiology

## 2022-12-03 NOTE — Telephone Encounter (Signed)
Notified by [employer] that patient had her first documented episode of atrial fibrillation this AM. Patient had a 90 second episode of atrial fibrillation at 4:50 AM today with average HR 49 BPM (range 41-58 BPM). Per chart review, patient has a known history of persistent atrial fibrillation. She is on eliquis 5 mg BID for stroke prophylaxis. Also treated with amiodarone 200 mg daily and metoprolol 12.5 mg BID.   As afib was rate controlled and patient is already on anticoagulation, no med changes at this time. Continue to monitor  Margie Billet, PA-C 12/03/2022 7:50 AM

## 2022-12-06 ENCOUNTER — Telehealth: Payer: Self-pay | Admitting: Cardiovascular Disease

## 2022-12-06 ENCOUNTER — Encounter (HOSPITAL_COMMUNITY): Payer: Self-pay

## 2022-12-06 ENCOUNTER — Emergency Department (HOSPITAL_COMMUNITY): Payer: Medicare Other

## 2022-12-06 ENCOUNTER — Other Ambulatory Visit: Payer: Self-pay

## 2022-12-06 ENCOUNTER — Observation Stay (HOSPITAL_COMMUNITY)
Admission: EM | Admit: 2022-12-06 | Discharge: 2022-12-08 | Disposition: A | Discharge status: Home / Self Care | Payer: Medicare Other | Attending: Cardiology | Admitting: Cardiology

## 2022-12-06 DIAGNOSIS — B372 Candidiasis of skin and nail: Secondary | ICD-10-CM

## 2022-12-06 DIAGNOSIS — Z1152 Encounter for screening for COVID-19: Secondary | ICD-10-CM | POA: Diagnosis not present

## 2022-12-06 DIAGNOSIS — R0902 Hypoxemia: Secondary | ICD-10-CM | POA: Diagnosis not present

## 2022-12-06 DIAGNOSIS — I5041 Acute combined systolic (congestive) and diastolic (congestive) heart failure: Secondary | ICD-10-CM | POA: Diagnosis not present

## 2022-12-06 DIAGNOSIS — J90 Pleural effusion, not elsewhere classified: Secondary | ICD-10-CM | POA: Diagnosis not present

## 2022-12-06 DIAGNOSIS — I4819 Other persistent atrial fibrillation: Secondary | ICD-10-CM | POA: Diagnosis not present

## 2022-12-06 DIAGNOSIS — E039 Hypothyroidism, unspecified: Secondary | ICD-10-CM | POA: Diagnosis not present

## 2022-12-06 DIAGNOSIS — Z7901 Long term (current) use of anticoagulants: Secondary | ICD-10-CM | POA: Insufficient documentation

## 2022-12-06 DIAGNOSIS — Z79899 Other long term (current) drug therapy: Secondary | ICD-10-CM | POA: Diagnosis not present

## 2022-12-06 DIAGNOSIS — I2510 Atherosclerotic heart disease of native coronary artery without angina pectoris: Secondary | ICD-10-CM | POA: Insufficient documentation

## 2022-12-06 DIAGNOSIS — I350 Nonrheumatic aortic (valve) stenosis: Secondary | ICD-10-CM | POA: Diagnosis not present

## 2022-12-06 DIAGNOSIS — E1122 Type 2 diabetes mellitus with diabetic chronic kidney disease: Secondary | ICD-10-CM | POA: Diagnosis not present

## 2022-12-06 DIAGNOSIS — E8779 Other fluid overload: Secondary | ICD-10-CM

## 2022-12-06 DIAGNOSIS — I499 Cardiac arrhythmia, unspecified: Secondary | ICD-10-CM | POA: Diagnosis not present

## 2022-12-06 DIAGNOSIS — N183 Chronic kidney disease, stage 3 unspecified: Secondary | ICD-10-CM | POA: Diagnosis not present

## 2022-12-06 DIAGNOSIS — I130 Hypertensive heart and chronic kidney disease with heart failure and stage 1 through stage 4 chronic kidney disease, or unspecified chronic kidney disease: Secondary | ICD-10-CM | POA: Diagnosis not present

## 2022-12-06 DIAGNOSIS — R079 Chest pain, unspecified: Secondary | ICD-10-CM | POA: Diagnosis not present

## 2022-12-06 DIAGNOSIS — R6889 Other general symptoms and signs: Secondary | ICD-10-CM | POA: Diagnosis not present

## 2022-12-06 DIAGNOSIS — I480 Paroxysmal atrial fibrillation: Secondary | ICD-10-CM | POA: Insufficient documentation

## 2022-12-06 DIAGNOSIS — E8770 Fluid overload, unspecified: Secondary | ICD-10-CM | POA: Diagnosis not present

## 2022-12-06 DIAGNOSIS — I4891 Unspecified atrial fibrillation: Secondary | ICD-10-CM | POA: Diagnosis not present

## 2022-12-06 DIAGNOSIS — I5031 Acute diastolic (congestive) heart failure: Principal | ICD-10-CM | POA: Insufficient documentation

## 2022-12-06 DIAGNOSIS — I110 Hypertensive heart disease with heart failure: Secondary | ICD-10-CM | POA: Diagnosis not present

## 2022-12-06 DIAGNOSIS — R0602 Shortness of breath: Secondary | ICD-10-CM | POA: Diagnosis present

## 2022-12-06 DIAGNOSIS — Z743 Need for continuous supervision: Secondary | ICD-10-CM | POA: Diagnosis not present

## 2022-12-06 DIAGNOSIS — B379 Candidiasis, unspecified: Secondary | ICD-10-CM | POA: Insufficient documentation

## 2022-12-06 DIAGNOSIS — I509 Heart failure, unspecified: Secondary | ICD-10-CM | POA: Diagnosis not present

## 2022-12-06 LAB — BASIC METABOLIC PANEL
Anion gap: 9 (ref 5–15)
BUN: 11 mg/dL (ref 8–23)
CO2: 23 mmol/L (ref 22–32)
Calcium: 9 mg/dL (ref 8.9–10.3)
Chloride: 107 mmol/L (ref 98–111)
Creatinine, Ser: 1.04 mg/dL — ABNORMAL HIGH (ref 0.44–1.00)
GFR, Estimated: 55 mL/min — ABNORMAL LOW (ref >60)
Glucose, Bld: 166 mg/dL — ABNORMAL HIGH (ref 70–99)
Potassium: 4.3 mmol/L (ref 3.5–5.1)
Sodium: 139 mmol/L (ref 135–145)

## 2022-12-06 LAB — TROPONIN I (HIGH SENSITIVITY)
Troponin I (High Sensitivity): 75 ng/L — ABNORMAL HIGH (ref <18)
Troponin I (High Sensitivity): 55 ng/L — ABNORMAL HIGH (ref <18)

## 2022-12-06 LAB — BRAIN NATRIURETIC PEPTIDE: B Natriuretic Peptide: 451.5 pg/mL — ABNORMAL HIGH (ref 0.0–100.0)

## 2022-12-06 LAB — CBC
HCT: 36.2 % (ref 36.0–46.0)
Hemoglobin: 11.8 g/dL — ABNORMAL LOW (ref 12.0–15.0)
MCH: 31.3 pg (ref 26.0–34.0)
MCHC: 32.6 g/dL (ref 30.0–36.0)
MCV: 96 fL (ref 80.0–100.0)
Platelets: 189 10*3/uL (ref 150–400)
RBC: 3.77 MIL/uL — ABNORMAL LOW (ref 3.87–5.11)
RDW: 15.7 % — ABNORMAL HIGH (ref 11.5–15.5)
WBC: 11.4 10*3/uL — ABNORMAL HIGH (ref 4.0–10.5)
nRBC: 0 % (ref 0.0–0.2)

## 2022-12-06 LAB — RESP PANEL BY RT-PCR (RSV, FLU A&B, COVID)  RVPGX2
Influenza A by PCR: NEGATIVE
Influenza B by PCR: NEGATIVE
Resp Syncytial Virus by PCR: NEGATIVE
SARS Coronavirus 2 by RT PCR: NEGATIVE

## 2022-12-06 MED ORDER — SODIUM CHLORIDE 0.9% FLUSH
3.0000 mL | Freq: Two times a day (BID) | INTRAVENOUS | Status: DC
  Administered 2022-12-07 – 2022-12-08 (×4): 3 mL via INTRAVENOUS

## 2022-12-06 MED ORDER — ACETAMINOPHEN 325 MG PO TABS: 650.0000 mg | ORAL_TABLET | ORAL | Status: DC | PRN

## 2022-12-06 MED ORDER — SODIUM CHLORIDE 0.9 % IV SOLN: 250.0000 mL | INTRAVENOUS | Status: DC | PRN

## 2022-12-06 MED ORDER — APIXABAN 5 MG PO TABS
5.0000 mg | ORAL_TABLET | Freq: Once | ORAL | Status: AC
  Administered 2022-12-06: 5 mg via ORAL
  Filled 2022-12-06: qty 1

## 2022-12-06 MED ORDER — METOPROLOL TARTRATE 12.5 MG HALF TABLET
12.5000 mg | ORAL_TABLET | Freq: Two times a day (BID) | ORAL | Status: DC
  Administered 2022-12-07 – 2022-12-08 (×3): 12.5 mg via ORAL
  Filled 2022-12-06 (×3): qty 1

## 2022-12-06 MED ORDER — FUROSEMIDE 10 MG/ML IJ SOLN
60.0000 mg | INTRAMUSCULAR | Status: AC
  Administered 2022-12-06: 60 mg via INTRAVENOUS
  Filled 2022-12-06: qty 6

## 2022-12-06 MED ORDER — SODIUM CHLORIDE 0.9% FLUSH: 3.0000 mL | INTRAVENOUS | Status: DC | PRN

## 2022-12-06 MED ORDER — AMIODARONE HCL 200 MG PO TABS
200.0000 mg | ORAL_TABLET | Freq: Every morning | ORAL | Status: DC
  Administered 2022-12-07 – 2022-12-08 (×2): 200 mg via ORAL
  Filled 2022-12-06 (×2): qty 1

## 2022-12-06 MED ORDER — APIXABAN 5 MG PO TABS
5.0000 mg | ORAL_TABLET | Freq: Two times a day (BID) | ORAL | Status: DC
  Administered 2022-12-07 – 2022-12-08 (×3): 5 mg via ORAL
  Filled 2022-12-06 (×3): qty 1

## 2022-12-06 MED ORDER — ONDANSETRON HCL 4 MG/2ML IJ SOLN: 4.0000 mg | Freq: Four times a day (QID) | INTRAMUSCULAR | Status: DC | PRN

## 2022-12-06 NOTE — Telephone Encounter (Signed)
Pt c/o Shortness Of Breath: STAT if SOB developed within the last 24 hours or pt is noticeably SOB on the phone  1. Are you currently SOB (can you hear that pt is SOB on the phone)?   Yes  2. How long have you been experiencing SOB?   Started Monday  3. Are you SOB when sitting or when up moving around?   When sitting  4. Are you currently experiencing any other symptoms?   Some chest pain     Pt c/o of Chest Pain: STAT if CP now or developed within 24 hours  1. Are you having CP right now? Yes  4. Is your CP continuous or coming and going?   Coming and going  5. Have you taken Nitroglycerin?   Does not have  Husband stated the patient has been feeling weak and has been wheezing and is pale.

## 2022-12-06 NOTE — H&P (Signed)
Cardiology Admission History and Physical   Patient ID: [name] [name] MRN: [id number]; DOB: 07/03/1941   Admission date: 12/06/2022  PCP:  Glenis Smoker, MD   [facility] Cardiologist:  None  Electrophysiologist:  Vickie Epley, MD       Chief Complaint:  shortness of breath  Patient Profile:   [name] [name] is a 81 y.o. 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  History of Present Illness:   [name] [name] is a 81 y.o. 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  Notes that her symptoms were progressive and today was the first time she experienced shortness of breath at rest.  Notes that in the days before she was able to walk some however she was noting the distances that she could walk or getting shorter and shorter.  Notes that she went to a grandchild event at a school and did some work at [employer] later that day, and then came home and slept for 5 hours.  She did note today some chest tightness but not severe.  Notes that she is almost never that fatigued after minimal work.  Has not had any other new symptoms, such as fevers, chills, abdominal pain, nausea, or vomiting.  She was discharged from the hospital for TAVR procedure on 11/29/2022.  She notes that she did well for several days after the TAVR.  Postoperative echo completed and noted well-functioning valve and a normal EF.  Her postprocedural ECG did have a new LBBB.  Given that she was sent home with a Zio patch to ensure that she did not have any H  AVB after the procedure.  On 12/01/2022 it does look that she was noted to have high-grade AV block for the first time but was asleep.  At that time had not noted any of the symptoms yet.  On 5/17, was noted that she had 90 seconds of A-fib with slow ventricular response.  Heart rate was 40s to 50s.  On arrival to the emergency department, she was hemodynamically stable however was requiring some oxygen initially.  Initial workup included a BNP that was elevated to 451.  Initial troponin was 75  and repeat was 55.  Otherwise unremarkable.  She was given a dose of diuretics in the emergency department with good urine output, but inability to come off of oxygen.  Thus cardiology was called for admission.   Past Medical History:  Diagnosis Date   Anemia    hx of   Arthritis    knee and shoulders   Atrial fibrillation (HCC)    Carpal tunnel syndrome on right    Coronary artery disease 05/2020   Minimal, nobstructive CAD. Tortuous cororany arteries.   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 Angelena Form & Dr. Tenny Craw.    Past Surgical History:  Procedure Laterality Date   ABDOMINAL HYSTERECTOMY  1992   complete   BILATERAL CARPAL TUNNEL RELEASE  yrs ago   CARDIOVERSION N/A 09/23/2020   Procedure: CARDIOVERSION;  Surgeon: Freada Bergeron, MD;  Location: [place];  Service: Cardiovascular;  Laterality: N/A;   CARDIOVERSION N/A 03/21/2021   Procedure:  CARDIOVERSION;  Surgeon: Werner Lean, MD;  Location: [place];  Service: Cardiovascular;  Laterality: N/A;   CHOLECYSTECTOMY  1996   CYSTOSCOPY/URETEROSCOPY/HOLMIUM LASER/STENT PLACEMENT Bilateral 08/19/2020   Procedure: CYSTOSCOPY BILATERAL  RETROGRADE  URETEROSCOPY/HOLMIUM LASER/STENT PLACEMENT;  Surgeon: Irine Seal, MD;  Location: [facility];  Service: Urology;  Laterality: Bilateral;   HAMMER TOE SURGERY Right yrs ago   INTRAOPERATIVE TRANSTHORACIC ECHOCARDIOGRAM N/A 11/28/2022   Procedure: INTRAOPERATIVE TRANSTHORACIC ECHOCARDIOGRAM;  Surgeon: Burnell Blanks, MD;  Location: [place];  Service: Open Heart Surgery;  Laterality: N/A;   IR RADIOLOGIST EVAL & MGMT  10/26/2020   IR RADIOLOGIST EVAL & MGMT  01/06/2021   IR RADIOLOGIST EVAL & MGMT  03/22/2021   IR RADIOLOGIST EVAL & MGMT  07/06/2021   IR RADIOLOGIST EVAL & MGMT  01/11/2022   LEFT HEART CATH AND CORONARY ANGIOGRAPHY N/A 06/02/2020   Procedure: LEFT HEART CATH AND CORONARY ANGIOGRAPHY;  Surgeon: Jettie Booze, MD;  Location: [facility];  Service: Cardiovascular;  Laterality: N/A;   RADIOLOGY WITH ANESTHESIA N/A 11/17/2020   Procedure: RADIOLOGY WITH ANESTHESIA RENAL MICROWAVE ABLATION;  Surgeon: Suzette Battiest, MD;  Location: [facility];  Service: Radiology;  Laterality: N/A;   TONSILLECTOMY  age 62   TRANSCATHETER AORTIC VALVE REPLACEMENT, TRANSFEMORAL N/A 11/28/2022   Procedure: Transcatheter Aortic Valve Replacement, Transfemoral using a Medtronic 29 mm EVOLUT FX Aortic Valve;  Surgeon: Burnell Blanks, MD;  Location: [facility];  Service: Open Heart Surgery;  Laterality: N/A;     Medications Prior to Admission: Prior to Admission medications   Medication Sig Start Date End Date Taking? Authorizing Provider  acetaminophen (TYLENOL) 500 MG tablet Take 1,000 mg by mouth daily as needed for moderate pain or headache.   Yes [provider]  amiodarone (PACERONE) 200 MG tablet  Take 1 tablet (200 mg total) by mouth daily. Patient taking differently: Take 200 mg by mouth in the morning. 07/21/22  Yes Shirley Friar, PA-C  Calcium Carb-Cholecalciferol (CALCIUM 600+D) 600-800 MG-UNIT TABS Take 1 tablet by mouth every evening.    Yes [provider]  ELIQUIS 5 MG TABS tablet TAKE 1 TABLET BY MOUTH TWICE A DAY Patient taking differently: Take 5 mg by mouth 2 (two) times daily. 07/21/22  Yes Vickie Epley, MD  Hydrocortisone-Aloe Vera ([id number]-62/HUTM EX) Apply 1 application topically 2 (two) times daily as needed (itching).   Yes [provider]  Liniments (BLUE-EMU SUPER STRENGTH) CREA Apply 1 application  topically daily as needed (arthritis).   Yes [provider]  Menthol (HALLS COUGH DROPS MT) Use as directed 1-2 each in the mouth or throat every Sunday.   Yes [provider]  metoprolol tartrate (LOPRESSOR) 25 MG tablet Take  0.5 tablets (12.5 mg total) by mouth 2 (two) times daily. 04/03/22  Yes Burnell Blanks, MD  Multiple Vitamins-Minerals (ONE A DAY WOMEN 50 PLUS PO) Take 1 tablet by mouth every evening.   Yes [provider]  Multiple Vitamins-Minerals (PRESERVISION AREDS 2 PO) Take 1 capsule by mouth 2 (two) times daily.    Yes [provider]  Phenol (CHLORASEPTIC MT) Use as directed 1 spray in the mouth or throat at bedtime.   Yes [provider]  Potassium Chloride ER 20 MEQ TBCR Take 2 tablets by mouth daily. Patient taking differently: Take 40 mEq by mouth in the morning. 10/20/21  Yes Vickie Epley, MD  torsemide (DEMADEX) 20 MG tablet Take 1 tablet (20 mg total) by mouth daily. Patient taking differently: Take 20 mg by mouth in the morning. 04/03/22  Yes Burnell Blanks, MD     Allergies:    Allergies  Allergen Reactions   Tetanus-Diphtheria Toxoids Td Other (See Comments)    Felt very sick   Cardizem [Diltiazem] Rash    Social History:   Social History    Socioeconomic History   Marital status: Married    Spouse name: Not on file   Number of children: 5   Years of education: Not on file   Highest education level: Not on file  Occupational History   Occupation: Retired-pastor  Tobacco Use   Smoking status: Never   Smokeless tobacco: Never  Vaping Use   Vaping Use: Never used  Substance and Sexual Activity   Alcohol use: Not Currently   Drug use: Never   Sexual activity: Not Currently    Birth control/protection: Surgical  Other Topics Concern   Not on file  Social History Narrative   Not on file   Social Determinants of Health   Financial Resource Strain: Not on file  Food Insecurity: Not on file  Transportation Needs: Not on file  Physical Activity: Not on file  Stress: Not on file  Social Connections: Not on file  Intimate Partner Violence: Not on file    Family History:   [name] family history includes Diabetes in her paternal grandmother; Hypertension in her father and mother.    ROS:  Please see the history of present illness.  All other ROS reviewed and negative.     Physical Exam/Data:   Vitals:   12/06/22 2030 12/06/22 2045 12/06/22 2100 12/06/22 2115  BP: (!) 165/50 (!) 155/49 (!) 159/41 (!) 145/44  Pulse: 72 71 70 66  Resp: 14 (!) '26 17 16  '$ Temp:      TempSrc:      SpO2: 97% 96% 95% 96%  Weight:      Height:        Intake/Output Summary (Last 24 hours) at 12/06/2022 2140 Last data filed at 12/06/2022 1900 Gross per 24 hour  Intake --  Output 500 ml  Net -500 ml      12/06/2022    3:05 PM 11/29/2022    3:56 AM 11/28/2022    8:21 AM  Last 3 Weights  Weight (lbs) 270 lb 1 oz 270 lb 3.2 oz 272 lb 0.8 oz  Weight (kg) 122.5 kg 122.562 kg 123.4 kg     Body mass index is 43.59 kg/m.  General:   in no acute distress HEENT: normal Neck:  JVD to mid neck Vascular: No carotid bruits; Distal pulses 2+ bilaterally   Cardiac:  normal S1, S2; RRR; grade 2/6 systolic murmur at R  SB Lungs:  Coarse breath sounds throughout  abd: soft, nontender, no hepatomegaly  Ext: 1+ edema, right greater then left Musculoskeletal:  No deformities, BUE and BLE strength normal and equal Skin: warm and dry  Neuro:  CNs 2-12 intact, no focal abnormalities noted Psych:  Normal affect    EKG:  The ECG that was done  was personally reviewed and demonstrates atrial fibrillation with normal rate.  Left bundle branch block present  Relevant CV Studies: Echo 11/29/2022: 1. There is a 29 mm stented (TAVR) valve present in the aortic position.  Procedure Date: 11/28/2022. Effective orifice area, by VTI measures 2.23  cm. Aortic valve mean gradient measures 15.0 mmHg. Peak gradient 29 mm  Hg, DVI 0.45. Mild PVL adjacten to  the intraventricular septum.   2. Left ventricular ejection fraction, by estimation, is 60 to 65%. The  left ventricle has normal function. The left ventricle has no regional  wall motion abnormalities. There is mild concentric left ventricular  hypertrophy. Left ventricular diastolic  function could not be evaluated.   3. Right ventricular systolic function is normal. The right ventricular  size is normal. Tricuspid regurgitation signal is inadequate for assessing  PA pressure.   4. Left atrial size was moderately dilated.   5. The mitral valve is degenerative. Trivial mitral valve regurgitation.  The mean mitral valve gradient is 5.0 mmHg.   Laboratory Data:  High Sensitivity Troponin:   Recent Labs  Lab 12/06/22 1541 12/06/22 1742  TROPONINIHS 75* 55*      Chemistry Recent Labs  Lab 12/06/22 1541  NA 139  K 4.3  CL 107  CO2 23  GLUCOSE 166*  BUN 11  CREATININE 1.04*  CALCIUM 9.0  GFRNONAA 55*  ANIONGAP 9    No results for input(s): "PROT", "ALBUMIN", "AST", "ALT", "ALKPHOS", "BILITOT" in the last 168 hours. Lipids No results for input(s): "CHOL", "TRIG", "HDL", "LABVLDL", "LDLCALC", "CHOLHDL" in the last 168 hours. Hematology Recent Labs  Lab  12/06/22 1541  WBC 11.4*  RBC 3.77*  HGB 11.8*  HCT 36.2  MCV 96.0  MCH 31.3  MCHC 32.6  RDW 15.7*  PLT 189   Thyroid No results for input(s): "TSH", "FREET4" in the last 168 hours. BNP Recent Labs  Lab 12/06/22 1541  BNP 451.5*    DDimer No results for input(s): "DDIMER" in the last 168 hours.   Radiology/Studies:  DG Chest 2 View  Result Date: 12/06/2022 CLINICAL DATA:  chest pain EXAM: CHEST two views COMPARISON:  11/24/2022 FINDINGS: Cardiac silhouette is prominent. There is pulmonary interstitial prominence with vascular congestion. No focal consolidation. No pneumothorax identified. There are small pleural effusions. There are thoracic degenerative changes. There is a prosthetic aortic valve. Implanted electronic device overlies left hemithorax. IMPRESSION: Findings suggest CHF. Electronically Signed   By: Sammie Bench M.D.   On: 12/06/2022 16:15     Assessment and Plan:   # Progressive dyspnea, now at rest # s/p recent TAVR 11/28/2022 for LFLG severe AS She is having some heart failure symptoms along with elevated BNP.  However her symptoms were also progressive and exertional.  Would start with an echo to see her LV function and placement of the TAVR.  Would want to make sure that her LV function remains normal and that the gradients across the valve are okay.  Has not had any episodes of high-grade AV block while in the emergency department, but we will keep her on telemetry to evaluate.  The other sideration given  her dyspnea on exertion and mildly elevated troponin with a downtrend, is questioning whether coronary ischemia could be driving her dyspnea on exertion.  If her echo is unremarkable, then should consider left heart catheterization.  Given her shortness of breath and hypoxia initially, could also consider pulmonary embolism post procedurally given her age and obesity as risk factors.  It seems less likely as she has not been immobile, but if right heart strain  or anything abnormal on the right side of the heart is noted on the echo then should have this is a consideration.  She also noted that when she has had issues with atrial fibrillation in the past, this is how she has felt.  However this is more severe than her atrial fibrillation episodes.  Plan: --Ordered echo for in the morning - Hold further diuretics for now; I was able to wean oxygen off in the emergency department  #Persistent A-fib: -Continue amiodarone and metoprolol -Continue Eliquis  # Hypertension Metoprolol as above   Risk Assessment/Risk Scores:       New York Heart Association (NYHA) Functional Class NYHA Class IV  CHA2DS2-VASc Score =    :1CHADS2Vasc 5 This indicates a 7.2  % annual risk of stroke.   Severity of Illness: The appropriate patient status for this patient is OBSERVATION. Observation status is judged to be reasonable and necessary in order to provide the required intensity of service to ensure the patient's safety. [name] presenting symptoms, physical exam findings, and initial radiographic and laboratory data in the context of their medical condition is felt to place them at decreased risk for further clinical deterioration. Furthermore, it is anticipated that the patient will be medically stable for discharge from the hospital within 2 midnights of admission.    For questions or updates, please contact [facility] Please consult [email or web address] for contact info under     Signed, Doyne Keel, MD  12/06/2022 9:40 PM

## 2022-12-06 NOTE — ED Provider Notes (Signed)
[facility] EMERGENCY DEPARTMENT Provider Note   CSN: [id number] Arrival date & time: 12/06/22  1501     History {Add pertinent medical, surgical, social history, OB history to HPI:1} Chief Complaint  Patient presents with  . Shortness of Breath    GWENDA [name] is a 81 y.o. female.  81 yo F with recent TAVR on *** who presents with DOE. Also twinges of chest pain on exam. Also with congestion and cough which has lingered. Chills no fever. Doe now worse than before the surgery. Not on home O2.   On lasix good urine output. No changes. On eliquis but was paused until day after the surgery.        Home Medications Prior to Admission medications   Medication Sig Start Date End Date Taking? Authorizing Provider  acetaminophen (TYLENOL) 500 MG tablet Take 1,000 mg by mouth every 8 (eight) hours as needed for moderate pain or headache.    [provider]  amiodarone (PACERONE) 200 MG tablet Take 1 tablet (200 mg total) by mouth daily. 07/21/22   Shirley Friar, PA-C  Calcium Carb-Cholecalciferol (CALCIUM 600+D) 600-800 MG-UNIT TABS Take 1 tablet by mouth every evening.     [provider]  ELIQUIS 5 MG TABS tablet TAKE 1 TABLET BY MOUTH TWICE A DAY 07/21/22   Vickie Epley, MD  Hydrocortisone-Aloe Vera (ITGPQDIYM-41/RAXE EX) Apply 1 application topically 2 (two) times daily as needed (itching).    [provider]  Liniments (BLUE-EMU SUPER STRENGTH) CREA Apply 1 application  topically 2 (two) times daily as needed (arthritis).    [provider]  loperamide (IMODIUM A-D) 2 MG tablet Take 2-4 mg by mouth daily as needed for diarrhea or loose stools.    [provider]  metoprolol tartrate (LOPRESSOR) 25 MG tablet Take 0.5 tablets (12.5 mg total) by mouth 2 (two) times daily. 04/03/22   Burnell Blanks, MD  Multiple Vitamin (MULTIVITAMIN WITH MINERALS) TABS tablet Take 1 tablet by mouth daily. Woman's 50+     [provider]  Multiple Vitamins-Minerals (PRESERVISION AREDS 2 PO) Take 1 capsule by mouth 2 (two) times daily.     [provider]  phenol (CHLORASEPTIC) 1.4 % LIQD Use as directed 1 spray in the mouth or throat at bedtime.    [provider]  Potassium Chloride ER 20 MEQ TBCR Take 2 tablets by mouth daily. 10/20/21   Vickie Epley, MD  torsemide (DEMADEX) 20 MG tablet Take 1 tablet (20 mg total) by mouth daily. 04/03/22   Burnell Blanks, MD      Allergies    Tetanus-diphtheria toxoids td and Cardizem [diltiazem]    Review of Systems   Review of Systems  Physical Exam Updated Vital Signs BP (!) 150/49   Pulse 73   Temp 98 F (36.7 C) (Oral)   Resp 16   Ht '5\' 6"'$  (1.676 m)   Wt 122.5 kg   SpO2 97%   BMI 43.59 kg/m  Physical Exam  ED Results / Procedures / Treatments   Labs (all labs ordered are listed, but only abnormal results are displayed) Labs Reviewed  BASIC METABOLIC PANEL - Abnormal; Notable for the following components:      Result Value   Glucose, Bld 166 (*)    Creatinine, Ser 1.04 (*)    GFR, Estimated 55 (*)    All other components within normal limits  CBC - Abnormal; Notable for the following components:  WBC 11.4 (*)    RBC 3.77 (*)    Hemoglobin 11.8 (*)    RDW 15.7 (*)    All other components within normal limits  BRAIN NATRIURETIC PEPTIDE - Abnormal; Notable for the following components:   B Natriuretic Peptide 451.5 (*)    All other components within normal limits  TROPONIN I (HIGH SENSITIVITY) - Abnormal; Notable for the following components:   Troponin I (High Sensitivity) 75 (*)    All other components within normal limits  TROPONIN I (HIGH SENSITIVITY)    EKG None  Radiology DG Chest 2 View  Result Date: 12/06/2022 CLINICAL DATA:  chest pain EXAM: CHEST two views COMPARISON:  11/24/2022 FINDINGS: Cardiac silhouette is prominent. There is pulmonary interstitial prominence with vascular  congestion. No focal consolidation. No pneumothorax identified. There are small pleural effusions. There are thoracic degenerative changes. There is a prosthetic aortic valve. Implanted electronic device overlies left hemithorax. IMPRESSION: Findings suggest CHF. Electronically Signed   By: Sammie Bench M.D.   On: 12/06/2022 16:15    Procedures Procedures  {Document cardiac monitor, telemetry assessment procedure when appropriate:1}  Medications Ordered in ED Medications  apixaban (ELIQUIS) tablet 5 mg (5 mg Oral Given 12/06/22 1545)    ED Course/ Medical Decision Making/ A&P Clinical Course as of 12/06/22 1759  Wed Dec 06, 2022  1754 Discussed with Dr Irish Lack from cardiology who feels that  [RP]    Clinical Course User Index [RP] Fransico Meadow, MD                           Medical Decision Making  ***  {Document critical care time when appropriate:1} {Document review of labs and clinical decision tools ie heart score, Chads2Vasc2 etc:1}  {Document your independent review of radiology images, and any outside records:1} {Document your discussion with family members, caretakers, and with consultants:1} {Document social determinants of health affecting pt's care:1} {Document your decision making why or why not admission, treatments were needed:1} Final Clinical Impression(s) / ED Diagnoses Final diagnoses:  None    Rx / DC Orders ED Discharge Orders     None

## 2022-12-06 NOTE — ED Provider Triage Note (Signed)
Emergency Medicine Provider Triage Evaluation Note  [name] [name] , a 81 y.o. female  was evaluated in triage.  Pt complains of shortness of breath and dyspnea on exertion and.  Had TAVR 1 week ago, been short of breath for the last 3 to 4 days.  Intermittent substernal chest tightness none currently.  Worsening DOE, lower extremity edema which is baseline.  She is on Eliquis, has not taken it yet today.  Otherwise no missed doses..  Not on oxygen at baseline, requiring supplemental oxygen because hypoxic with EMS.  Review of Systems  Per HPI Physical Exam  BP 135/63   Pulse 84   Temp 97.8 F (36.6 C) (Oral)   Resp 16   Ht '5\' 6"'$  (1.676 m)   Wt 122.5 kg   SpO2 95%   BMI 43.59 kg/m  Gen:   Awake, no distress   Resp:  Normal effort new hypoxia MSK:   Moves extremities without difficulty  Other:  Lower extremity edema.  Medical Decision Making  Medically screening exam initiated at 3:28 PM.  Appropriate orders placed.  CHERRYL BABIN was informed that the remainder of the evaluation will be completed by another provider, this initial triage assessment does not replace that evaluation, and the importance of remaining in the ED until their evaluation is complete.     Sherrill Raring, PA-C 12/06/22 1530

## 2022-12-06 NOTE — ED Triage Notes (Signed)
Pt from home with EMS for DOE for the past several days, 7 days post op TAVR, on eliquis. COVID positive in NOV. Pt 87% on room air, on 4L [place] at 99%.   HR85-90 a fib BP 156/67

## 2022-12-06 NOTE — Telephone Encounter (Signed)
Pt called [facility] Triage regarding difficulty breathing; Pt currently at home.    Pt is s/p TAVR, 11/28/2022.  Pt stated her symptoms began to worsen this past Monday, 12/04/2022.  Her shortness of breath worsened, and this morning, was only able to ambulate to a chair, and sit in her living room.    I had Pt take her BP, HR, and O2.  At 135 pm her BP 159/55, P 82, and O2 90%.  Pt states she is still not able to ambulate far, and has to sit down, and feels like her SOB is worsening.    Pt husband was present to help obtain vital signs.  Pt advised to go to nearest ER, and if unable to ambulate safely, to call 911 for paramedic help to hospital to see an ER provider.  Pt understood and agreed with POC, and stated they will go to the nearest ER now.

## 2022-12-07 ENCOUNTER — Telehealth: Payer: Self-pay | Admitting: Cardiovascular Disease

## 2022-12-07 ENCOUNTER — Observation Stay (HOSPITAL_COMMUNITY): Payer: Medicare Other

## 2022-12-07 ENCOUNTER — Observation Stay (HOSPITAL_BASED_OUTPATIENT_CLINIC_OR_DEPARTMENT_OTHER): Payer: Medicare Other

## 2022-12-07 DIAGNOSIS — Z952 Presence of prosthetic heart valve: Secondary | ICD-10-CM

## 2022-12-07 DIAGNOSIS — I5033 Acute on chronic diastolic (congestive) heart failure: Secondary | ICD-10-CM

## 2022-12-07 LAB — CBC WITH DIFFERENTIAL/PLATELET
Abs Immature Granulocytes: 0.04 10*3/uL (ref 0.00–0.07)
Basophils Absolute: 0.1 10*3/uL (ref 0.0–0.1)
Basophils Relative: 1 %
Eosinophils Absolute: 0.1 10*3/uL (ref 0.0–0.5)
Eosinophils Relative: 2 %
HCT: 34.8 % — ABNORMAL LOW (ref 36.0–46.0)
Hemoglobin: 11.2 g/dL — ABNORMAL LOW (ref 12.0–15.0)
Immature Granulocytes: 1 %
Lymphocytes Relative: 12 %
Lymphs Abs: 1.1 10*3/uL (ref 0.7–4.0)
MCH: 31.2 pg (ref 26.0–34.0)
MCHC: 32.2 g/dL (ref 30.0–36.0)
MCV: 96.9 fL (ref 80.0–100.0)
Monocytes Absolute: 0.8 10*3/uL (ref 0.1–1.0)
Monocytes Relative: 9 %
Neutro Abs: 6.4 10*3/uL (ref 1.7–7.7)
Neutrophils Relative %: 75 %
Platelets: 177 10*3/uL (ref 150–400)
RBC: 3.59 MIL/uL — ABNORMAL LOW (ref 3.87–5.11)
RDW: 15.9 % — ABNORMAL HIGH (ref 11.5–15.5)
WBC: 8.5 10*3/uL (ref 4.0–10.5)
nRBC: 0 % (ref 0.0–0.2)

## 2022-12-07 LAB — ECHOCARDIOGRAM COMPLETE
AR max vel: 1.72 cm2
AV Area VTI: 1.47 cm2
AV Area mean vel: 1.6 cm2
AV Mean grad: 11 mmHg
AV Peak grad: 20.8 mmHg
Ao pk vel: 2.28 m/s
Area-P 1/2: 3.61 cm2
Height: 66 in
MV VTI: 1.24 cm2
P 1/2 time: 608 msec
S' Lateral: 3.9 cm
Weight: 4321.02 oz

## 2022-12-07 LAB — BASIC METABOLIC PANEL
Anion gap: 11 (ref 5–15)
BUN: 14 mg/dL (ref 8–23)
CO2: 22 mmol/L (ref 22–32)
Calcium: 8.6 mg/dL — ABNORMAL LOW (ref 8.9–10.3)
Chloride: 106 mmol/L (ref 98–111)
Creatinine, Ser: 1.25 mg/dL — ABNORMAL HIGH (ref 0.44–1.00)
GFR, Estimated: 44 mL/min — ABNORMAL LOW (ref >60)
Glucose, Bld: 103 mg/dL — ABNORMAL HIGH (ref 70–99)
Potassium: 4 mmol/L (ref 3.5–5.1)
Sodium: 139 mmol/L (ref 135–145)

## 2022-12-07 LAB — PROTIME-INR
INR: 1.6 — ABNORMAL HIGH (ref 0.8–1.2)
Prothrombin Time: 18.8 seconds — ABNORMAL HIGH (ref 11.4–15.2)

## 2022-12-07 LAB — MAGNESIUM: Magnesium: 2.1 mg/dL (ref 1.7–2.4)

## 2022-12-07 MED ORDER — PERFLUTREN LIPID MICROSPHERE
1.0000 mL | INTRAVENOUS | Status: AC | PRN
  Administered 2022-12-07: 2 mL via INTRAVENOUS

## 2022-12-07 MED ORDER — FUROSEMIDE 10 MG/ML IJ SOLN
40.0000 mg | Freq: Two times a day (BID) | INTRAMUSCULAR | Status: DC
  Administered 2022-12-07 – 2022-12-08 (×2): 40 mg via INTRAVENOUS
  Filled 2022-12-07 (×2): qty 4

## 2022-12-07 NOTE — Plan of Care (Signed)

## 2022-12-07 NOTE — ED Notes (Signed)
Patient transferred to vascular.

## 2022-12-07 NOTE — ED Notes (Signed)
ED TO INPATIENT HANDOFF REPORT  ED Nurse Name and Phone #: Philip Aspen Name/Age/Gender [name] 81 y.o. female Room/Bed: 002C/002C  Code Status   Code Status: Full Code  Home/SNF/Other Home Patient oriented to: self, place, time, and situation Is this baseline? Yes   Triage Complete: Triage complete  Chief Complaint Acute exacerbation of CHF (congestive heart failure) ([place]) [I50.9] Acute exacerbation of congestive heart failure ([place]) [I50.9]  Triage Note Pt from home with EMS for DOE for the past several days, 7 days post op TAVR, on eliquis. COVID positive in NOV. Pt 87% on room air, on 4L [place] at 99%.   HR85-90 a fib BP 156/67   Allergies Allergies  Allergen Reactions   Tetanus-Diphtheria Toxoids Td Other (See Comments)    Felt very sick   Cardizem [Diltiazem] Rash    Level of Care/Admitting Diagnosis ED Disposition     ED Disposition  Admit   Condition  --   Comment  Hospital Area: [place] [[id number]]  Level of Care: Telemetry Cardiac [[id number]]  May place patient in observation at [facility] or [place] if equivalent level of care is available:: No  Covid Evaluation: Asymptomatic - no recent exposure (last 10 days) testing not required  Diagnosis: Acute exacerbation of congestive heart failure [facility]) [[id number]]  Admitting Physician: Doyne Keel [phone]  Attending Physician: Deneen Harts          B Medical/Surgery History Past Medical History:  Diagnosis Date   Anemia    hx of   Arthritis    knee and shoulders   Atrial fibrillation ([place])    Carpal tunnel syndrome on right    Coronary artery disease 05/2020   Minimal, nobstructive CAD. Tortuous cororany arteries.   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 Angelena Form & Dr. Tenny Craw.   Past Surgical History:  Procedure Laterality Date   ABDOMINAL HYSTERECTOMY  1992   complete   BILATERAL CARPAL TUNNEL RELEASE  yrs ago   CARDIOVERSION N/A 09/23/2020   Procedure: CARDIOVERSION;  Surgeon: Freada Bergeron, MD;  Location: [place];  Service: Cardiovascular;  Laterality: N/A;   CARDIOVERSION N/A 03/21/2021   Procedure: CARDIOVERSION;  Surgeon: Werner Lean, MD;  Location: [place];  Service: Cardiovascular;  Laterality: N/A;   CHOLECYSTECTOMY  1996   CYSTOSCOPY/URETEROSCOPY/HOLMIUM LASER/STENT PLACEMENT Bilateral 08/19/2020   Procedure: CYSTOSCOPY BILATERAL  RETROGRADE  URETEROSCOPY/HOLMIUM LASER/STENT PLACEMENT;  Surgeon: Irine Seal, MD;  Location: [facility];  Service: Urology;  Laterality: Bilateral;   HAMMER TOE SURGERY Right yrs ago   INTRAOPERATIVE TRANSTHORACIC ECHOCARDIOGRAM N/A 11/28/2022   Procedure: INTRAOPERATIVE TRANSTHORACIC ECHOCARDIOGRAM;  Surgeon: Burnell Blanks, MD;  Location: [place];  Service: Open Heart Surgery;  Laterality: N/A;   IR RADIOLOGIST EVAL & MGMT  10/26/2020  IR RADIOLOGIST EVAL & MGMT  01/06/2021   IR RADIOLOGIST EVAL & MGMT  03/22/2021   IR RADIOLOGIST EVAL & MGMT  07/06/2021   IR RADIOLOGIST EVAL & MGMT  01/11/2022   LEFT HEART CATH AND CORONARY ANGIOGRAPHY N/A 06/02/2020   Procedure: LEFT HEART CATH AND CORONARY ANGIOGRAPHY;  Surgeon: Jettie Booze, MD;  Location: [place] [facility];  Service: Cardiovascular;  Laterality: N/A;   RADIOLOGY WITH ANESTHESIA N/A 11/17/2020   Procedure: RADIOLOGY WITH ANESTHESIA RENAL MICROWAVE ABLATION;   Surgeon: Suzette Battiest, MD;  Location: [facility];  Service: Radiology;  Laterality: N/A;   TONSILLECTOMY  age 45   TRANSCATHETER AORTIC VALVE REPLACEMENT, TRANSFEMORAL N/A 11/28/2022   Procedure: Transcatheter Aortic Valve Replacement, Transfemoral using a Medtronic 29 mm EVOLUT FX Aortic Valve;  Surgeon: Burnell Blanks, MD;  Location: [place] OR;  Service: Open Heart Surgery;  Laterality: N/A;     A IV Location/Drains/Wounds Patient Lines/Drains/Airways Status     Active Line/Drains/Airways     Name Placement date Placement time Site Days   Peripheral IV 12/06/22 20 G Anterior;Left Forearm 12/06/22  1748  Forearm  1   Ureteral Drain/Stent Right ureter 6 Fr. 08/19/20  0836  Right ureter  840   External Urinary Catheter 12/06/22  1809  --  1   Incision (Closed) 08/19/20 Perineum 08/19/20  0846  -- 840   Incision (Closed) 11/28/22 Groin Other (Comment) 11/28/22  1122  -- 9            Intake/Output Last 24 hours  Intake/Output Summary (Last 24 hours) at 12/07/2022 1259 Last data filed at 12/07/2022 0348 Gross per 24 hour  Intake 5 ml  Output 600 ml  Net -595 ml    Labs/Imaging Results for orders placed or performed during the hospital encounter of 12/06/22 (from the past 48 hour(s))  Basic metabolic panel     Status: Abnormal   Collection Time: 12/06/22  3:41 PM  Result Value Ref Range   Sodium 139 135 - 145 mmol/L   Potassium 4.3 3.5 - 5.1 mmol/L   Chloride 107 98 - 111 mmol/L   CO2 23 22 - 32 mmol/L   Glucose, Bld 166 (H) 70 - 99 mg/dL    Comment: Glucose reference range applies only to samples taken after fasting for at least 8 hours.   BUN 11 8 - 23 mg/dL   Creatinine, Ser 1.04 (H) 0.44 - 1.00 mg/dL   Calcium 9.0 8.9 - 10.3 mg/dL   GFR, Estimated 55 (L) >60 mL/min    Comment: (NOTE) Calculated using the CKD-EPI Creatinine Equation (2021)    Anion gap 9 5 - 15    Comment: Performed at [facility] [address] [place], [place] [address]  CBC      Status: Abnormal   Collection Time: 12/06/22  3:41 PM  Result Value Ref Range   WBC 11.4 (H) 4.0 - 10.5 K/uL   RBC 3.77 (L) 3.87 - 5.11 MIL/uL   Hemoglobin 11.8 (L) 12.0 - 15.0 g/dL   HCT 36.2 36.0 - 46.0 %   MCV 96.0 80.0 - 100.0 fL   MCH 31.3 26.0 - 34.0 pg   MCHC 32.6 30.0 - 36.0 g/dL   RDW 15.7 (H) 11.5 - 15.5 %   Platelets 189 150 - 400 K/uL   nRBC 0.0 0.0 - 0.2 %    Comment: Performed at [facility], [place] [address] [place], [place] [address]  Troponin I (  High Sensitivity)     Status: Abnormal   Collection Time: 12/06/22  3:41 PM  Result Value Ref Range   Troponin I (High Sensitivity) 75 (H) <18 ng/L    Comment: (NOTE) Elevated high sensitivity troponin I (hsTnI) values and significant  changes across serial measurements may suggest ACS but many other  chronic and acute conditions are known to elevate hsTnI results.  Refer to the "Links" section for chest pain algorithms and additional  guidance. Performed at [facility], [place] [address], [place], [place] [address]   Brain natriuretic peptide     Status: Abnormal   Collection Time: 12/06/22  3:41 PM  Result Value Ref Range   B Natriuretic Peptide 451.5 (H) 0.0 - 100.0 pg/mL    Comment: Performed at [facility] [address] [place], [place] [address]  Troponin I (High Sensitivity)     Status: Abnormal   Collection Time: 12/06/22  5:42 PM  Result Value Ref Range   Troponin I (High Sensitivity) 55 (H) <18 ng/L    Comment: DELTA CHECK NOTED (NOTE) Elevated high sensitivity troponin I (hsTnI) values and significant  changes across serial measurements may suggest ACS but many other  chronic and acute conditions are known to elevate hsTnI results.  Refer to the "Links" section for chest pain algorithms and additional  guidance. Performed at [facility], [place] [address] [place], [place] [address]   Resp panel by RT-PCR (RSV, Flu A&B, Covid) Anterior Nasal Swab     Status: None   Collection  Time: 12/06/22  5:45 PM   Specimen: Anterior Nasal Swab  Result Value Ref Range   SARS Coronavirus 2 by RT PCR NEGATIVE NEGATIVE    Comment: (NOTE) SARS-CoV-2 target nucleic acids are NOT DETECTED.  The SARS-CoV-2 RNA is generally detectable in upper respiratory specimens during the acute phase of infection. The lowest concentration of SARS-CoV-2 viral copies this assay can detect is 138 copies/mL. A negative result does not preclude SARS-Cov-2 infection and should not be used as the sole basis for treatment or other patient management decisions. A negative result may occur with  improper specimen collection/handling, submission of specimen other than nasopharyngeal swab, presence of viral mutation(s) within the areas targeted by this assay, and inadequate number of viral copies(<138 copies/mL). A negative result must be combined with clinical observations, patient history, and epidemiological information. The expected result is Negative.  Fact Sheet for Patients:  EntrepreneurPulse.com.au  Fact Sheet for Healthcare Providers:  IncredibleEmployment.be  This test is no t yet approved or cleared by the [place] FDA and  has been authorized for detection and/or diagnosis of SARS-CoV-2 by FDA under an Emergency Use Authorization (EUA). This EUA will remain  in effect (meaning this test can be used) for the duration of the COVID-19 declaration under Section 564(b)(1) of the Act, 21 U.S.C.section 360bbb-3(b)(1), unless the authorization is terminated  or revoked sooner.       Influenza A by PCR NEGATIVE NEGATIVE   Influenza B by PCR NEGATIVE NEGATIVE    Comment: (NOTE) The Xpert Xpress SARS-CoV-2/FLU/RSV plus assay is intended as an aid in the diagnosis of influenza from Nasopharyngeal swab specimens and should not be used as a sole basis for treatment. Nasal washings and aspirates are unacceptable for Xpert Xpress  SARS-CoV-2/FLU/RSV testing.  Fact Sheet for Patients: EntrepreneurPulse.com.au  Fact Sheet for Healthcare Providers: IncredibleEmployment.be  This test is not yet approved or cleared by the [place] FDA and has  been authorized for detection and/or diagnosis of SARS-CoV-2 by FDA under an Emergency Use Authorization (EUA). This EUA will remain in effect (meaning this test can be used) for the duration of the COVID-19 declaration under Section 564(b)(1) of the Act, 21 U.S.C. section 360bbb-3(b)(1), unless the authorization is terminated or revoked.     Resp Syncytial Virus by PCR NEGATIVE NEGATIVE    Comment: (NOTE) Fact Sheet for Patients: EntrepreneurPulse.com.au  Fact Sheet for Healthcare Providers: IncredibleEmployment.be  This test is not yet approved or cleared by the [place] FDA and has been authorized for detection and/or diagnosis of SARS-CoV-2 by FDA under an Emergency Use Authorization (EUA). This EUA will remain in effect (meaning this test can be used) for the duration of the COVID-19 declaration under Section 564(b)(1) of the Act, 21 U.S.C. section 360bbb-3(b)(1), unless the authorization is terminated or revoked.  Performed at [facility], [place] [address], [place], [place] [address]   Basic metabolic panel     Status: Abnormal   Collection Time: 12/07/22  4:47 AM  Result Value Ref Range   Sodium 139 135 - 145 mmol/L   Potassium 4.0 3.5 - 5.1 mmol/L    Comment: HEMOLYSIS AT THIS LEVEL MAY AFFECT RESULT   Chloride 106 98 - 111 mmol/L   CO2 22 22 - 32 mmol/L   Glucose, Bld 103 (H) 70 - 99 mg/dL    Comment: Glucose reference range applies only to samples taken after fasting for at least 8 hours.   BUN 14 8 - 23 mg/dL   Creatinine, Ser 1.25 (H) 0.44 - 1.00 mg/dL   Calcium 8.6 (L) 8.9 - 10.3 mg/dL   GFR, Estimated 44 (L) >60 mL/min    Comment: (NOTE) Calculated using the  CKD-EPI Creatinine Equation (2021)    Anion gap 11 5 - 15    Comment: Performed at [facility] [address] [place], [place] [address]  CBC with Differential/Platelet     Status: Abnormal   Collection Time: 12/07/22  4:47 AM  Result Value Ref Range   WBC 8.5 4.0 - 10.5 K/uL   RBC 3.59 (L) 3.87 - 5.11 MIL/uL   Hemoglobin 11.2 (L) 12.0 - 15.0 g/dL   HCT 34.8 (L) 36.0 - 46.0 %   MCV 96.9 80.0 - 100.0 fL   MCH 31.2 26.0 - 34.0 pg   MCHC 32.2 30.0 - 36.0 g/dL   RDW 15.9 (H) 11.5 - 15.5 %   Platelets 177 150 - 400 K/uL   nRBC 0.0 0.0 - 0.2 %   Neutrophils Relative % 75 %   Neutro Abs 6.4 1.7 - 7.7 K/uL   Lymphocytes Relative 12 %   Lymphs Abs 1.1 0.7 - 4.0 K/uL   Monocytes Relative 9 %   Monocytes Absolute 0.8 0.1 - 1.0 K/uL   Eosinophils Relative 2 %   Eosinophils Absolute 0.1 0.0 - 0.5 K/uL   Basophils Relative 1 %   Basophils Absolute 0.1 0.0 - 0.1 K/uL   Immature Granulocytes 1 %   Abs Immature Granulocytes 0.04 0.00 - 0.07 K/uL    Comment: Performed at [facility] [address], [place], [place] [address]  Protime-INR     Status: Abnormal   Collection Time: 12/07/22  4:47 AM  Result Value Ref Range   Prothrombin Time 18.8 (H) 11.4 - 15.2 seconds   INR 1.6 (H) 0.8 - 1.2    Comment: (NOTE) INR goal varies based on device and disease  states. Performed at [facility], [place] [address], [place], [place] [address]   Magnesium     Status: None   Collection Time: 12/07/22  4:47 AM  Result Value Ref Range   Magnesium 2.1 1.7 - 2.4 mg/dL    Comment: Performed at [facility] [address], [place], [place] [address]   DG Chest 2 View  Result Date: 12/06/2022 CLINICAL DATA:  chest pain EXAM: CHEST two views COMPARISON:  11/24/2022 FINDINGS: Cardiac silhouette is prominent. There is pulmonary interstitial prominence with vascular congestion. No focal consolidation. No pneumothorax identified. There are small pleural effusions. There are thoracic  degenerative changes. There is a prosthetic aortic valve. Implanted electronic device overlies left hemithorax. IMPRESSION: Findings suggest CHF. Electronically Signed   By: Sammie Bench M.D.   On: 12/06/2022 16:15    Pending Labs Unresulted Labs (From admission, onward)    None       Vitals/Pain Today's Vitals   12/07/22 0600 12/07/22 0800 12/07/22 0924 12/07/22 1000  BP: (!) 158/69 (!) 163/69  (!) 145/67  Pulse: 64 67  79  Resp: '17 19  15  '$ Temp:   (!) 97.5 F (36.4 C)   TempSrc:   Oral   SpO2: 94% 97%  94%  Weight:      Height:      PainSc:        Isolation Precautions No active isolations  Medications Medications  amiodarone (PACERONE) tablet 200 mg (200 mg Oral Given 12/07/22 0651)  metoprolol tartrate (LOPRESSOR) tablet 12.5 mg (12.5 mg Oral Given 12/07/22 1016)  apixaban (ELIQUIS) tablet 5 mg (5 mg Oral Given 12/07/22 1016)  sodium chloride flush (NS) 0.9 % injection 3 mL (3 mLs Intravenous Given 12/07/22 1021)  sodium chloride flush (NS) 0.9 % injection 3 mL (has no administration in time range)  0.9 %  sodium chloride infusion (has no administration in time range)  acetaminophen (TYLENOL) tablet 650 mg (has no administration in time range)  ondansetron (ZOFRAN) injection 4 mg (has no administration in time range)  perflutren lipid microspheres (DEFINITY) IV suspension (2 mLs Intravenous Given 12/07/22 1221)  apixaban (ELIQUIS) tablet 5 mg (5 mg Oral Given 12/06/22 1545)  furosemide (LASIX) injection 60 mg (60 mg Intravenous Given 12/06/22 1809)    Mobility non-ambulatory Low fall risk   Focused Assessments Pulmonary Assessment Handoff:  Lung sounds: Bilateral Breath Sounds: Diminished O2 Device: Nasal Cannula O2 Flow Rate (L/min): 2 L/min    R Recommendations: See Admitting Provider Note  Report given to:   Additional Notes:

## 2022-12-07 NOTE — Telephone Encounter (Signed)
Husband called in to let the dr know, the patient is in the hospital. Please advise

## 2022-12-07 NOTE — TOC Progression Note (Signed)
Transition of Care [facility]) - Progression Note    Patient Details  Name: [name] [name] MRN: [id number] Date of Birth: [birth year]  [facility] CM/SW Contact  Zenon Mayo, RN Phone Number: 12/07/2022, 4:20 PM  Clinical Narrative:    From home with spouse, presents with sob, on oxygen. TOC following.         Expected Discharge Plan and Services                                               Social Determinants of Health (SDOH) Interventions SDOH Screenings   Food Insecurity: No Food Insecurity (12/07/2022)  Housing: Low Risk  (12/07/2022)  Transportation Needs: No Transportation Needs (12/07/2022)  Utilities: Not At Risk (12/07/2022)  Tobacco Use: Low Risk  (12/06/2022)    Readmission Risk Interventions     No data to display

## 2022-12-07 NOTE — Telephone Encounter (Signed)
Called [name] [name] pt spouse in regards to inpatient status.  Spouse would like to cancel OV for tomorrow 12/08/22 with structural heart team.  Pt currently inpatient dx Acute on Chronic HF. Will send to [employer] as a FYI.

## 2022-12-07 NOTE — Care Management Obs Status (Signed)
MEDICARE OBSERVATION STATUS NOTIFICATION   Patient Details  Name: [name] [name] MRN: [id number] Date of Birth: 04-29-1941   Medicare Observation Status Notification Given:  Yes    Zenon Mayo, RN 12/07/2022, 4:15 PM

## 2022-12-07 NOTE — Progress Notes (Signed)
Rounding Note    Patient Name: [name] [name] Date of Encounter: 12/07/2022  [place] Cardiologist: None   Subjective   Feeling better.  Less short of breath.  Still feels as though she had significant shortness of breath NYHA class IV type symptoms on Wednesday.  Inpatient Medications    Scheduled Meds:  amiodarone  200 mg Oral q AM   apixaban  5 mg Oral BID   furosemide  40 mg Intravenous BID   metoprolol tartrate  12.5 mg Oral BID   sodium chloride flush  3 mL Intravenous Q12H   Continuous Infusions:  sodium chloride     PRN Meds: sodium chloride, acetaminophen, ondansetron (ZOFRAN) IV, sodium chloride flush   Vital Signs    Vitals:   12/07/22 1100 12/07/22 1300 12/07/22 1342 12/07/22 1417  BP: (!) 157/72 (!) 156/47  (!) 153/47  Pulse: 74 64  65  Resp:  18  18  Temp:   97.9 F (36.6 C) 97.8 F (36.6 C)  TempSrc:   Oral Oral  SpO2: 98% 98%  99%  Weight:    123.3 kg  Height:    '5\' 6"'$  (1.676 m)    Intake/Output Summary (Last 24 hours) at 12/07/2022 1447 Last data filed at 12/07/2022 0348 Gross per 24 hour  Intake 5 ml  Output 600 ml  Net -595 ml      12/07/2022    2:17 PM 12/06/2022    3:05 PM 11/29/2022    3:56 AM  Last 3 Weights  Weight (lbs) 271 lb 13.2 oz 270 lb 1 oz 270 lb 3.2 oz  Weight (kg) 123.3 kg 122.5 kg 122.562 kg      Physical Exam   GEN: No acute distress.   Neck: No JVD Cardiac: RRR, 2/6 systolic murmur, no rubs, or gallops.  Respiratory: Clear to auscultation bilaterally. GI: Soft, nontender, non-distended  MS: No edema; No deformity. Neuro:  Nonfocal  Psych: Normal affect   Labs    High Sensitivity Troponin:   Recent Labs  Lab 12/06/22 1541 12/06/22 1742  TROPONINIHS 75* 55*     Chemistry Recent Labs  Lab 12/06/22 1541 12/07/22 0447  NA 139 139  K 4.3 4.0  CL 107 106  CO2 23 22  GLUCOSE 166* 103*  BUN 11 14  CREATININE 1.04* 1.25*  CALCIUM 9.0 8.6*  MG  --  2.1  GFRNONAA 55* 44*  ANIONGAP 9  11    Lipids No results for input(s): "CHOL", "TRIG", "HDL", "LABVLDL", "LDLCALC", "CHOLHDL" in the last 168 hours.  Hematology Recent Labs  Lab 12/06/22 1541 12/07/22 0447  WBC 11.4* 8.5  RBC 3.77* 3.59*  HGB 11.8* 11.2*  HCT 36.2 34.8*  MCV 96.0 96.9  MCH 31.3 31.2  MCHC 32.6 32.2  RDW 15.7* 15.9*  PLT 189 177   Thyroid No results for input(s): "TSH", "FREET4" in the last 168 hours.  BNP Recent Labs  Lab 12/06/22 1541  BNP 451.5*    DDimer No results for input(s): "DDIMER" in the last 168 hours.   Radiology    ECHOCARDIOGRAM COMPLETE  Result Date: 12/07/2022    ECHOCARDIOGRAM REPORT   Patient Name:   [name] [name] Date of Exam: 12/07/2022 Medical Rec #:  [id number]   Height:       66.0 in Accession #:    [id number]  Weight:       270.1 lb Date of Birth:  21-Jun-1941   BSA:  2.272 m Patient Age:    81 years    BP:           145/67 mmHg Patient Gender: F           HR:           76 bpm. Exam Location:  Inpatient Procedure: 2D Echo, Cardiac Doppler and Color Doppler Indications:    s/p tavr  History:        Patient has prior history of Echocardiogram examinations, most                 recent 06/03/2020. Risk Factors:Hypertension, Diabetes and                 Dyslipidemia.  Sonographer:    Melissa Morford RDCS (AE, PE) Referring Phys: [id number] [place]  1. Left ventricular ejection fraction, by estimation, is 55 to 60%. The left ventricle has normal function. The left ventricle has no regional wall motion abnormalities but there appears to be septal-lateral dyssynchrony consistent with LBBB. There is mild left ventricular hypertrophy. Left ventricular diastolic parameters are consistent with Grade II diastolic dysfunction (pseudonormalization).  2. Peak RV-RA gradient 17 mmHg. IVC not visualized. Right ventricular systolic function is normal. The right ventricular size is normal.  3. Left atrial size was moderately dilated.  4. Right atrial size was mildly  dilated.  5. The mitral valve is degenerative. Mild mitral valve regurgitation. Moderate mitral stenosis. The mean mitral valve gradient is 7.0 mmHg, MVA 1.24 cm^2 (VTI). Moderate mitral annular calcification.  6. 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 Mild peri-valvular regurgitation. Mean gradient 11 mmHg. EOA 1.47 cm^2. FINDINGS  Left Ventricle: Left ventricular ejection fraction, by estimation, is 55 to 60%. The left ventricle has normal function. The left ventricle has no regional wall motion abnormalities. Definity contrast agent was given IV to delineate the left ventricular  endocardial borders. The left ventricular internal cavity size was normal in size. There is mild left ventricular hypertrophy. Left ventricular diastolic parameters are consistent with Grade II diastolic dysfunction (pseudonormalization). Right Ventricle: Peak RV-RA gradient 17 mmHg. IVC not visualized. The right ventricular size is normal. No increase in right ventricular wall thickness. Right ventricular systolic function is normal. Left Atrium: Left atrial size was moderately dilated. Right Atrium: Right atrial size was mildly dilated. Pericardium: There is no evidence of pericardial effusion. Mitral Valve: The mitral valve is degenerative in appearance. There is moderate thickening of the mitral valve leaflet(s). There is moderate calcification of the mitral valve leaflet(s). Moderate mitral annular calcification. Mild mitral valve regurgitation. Moderate mitral valve stenosis. The mean mitral valve gradient is 7.0 mmHg. Tricuspid Valve: The tricuspid valve is normal in structure. Tricuspid valve regurgitation is trivial. 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 Mild peri-valvular regurgitation. Mean gradient 11 mmHg. EOA 1.47 cm^2. The aortic valve has been repaired/replaced. Aortic valve regurgitation is mild. Aortic regurgitation PHT measures 608 msec. Aortic  valve mean gradient measures 11.0 mmHg. Aortic valve peak gradient measures 20.8 mmHg. Aortic valve area, by VTI measures 1.47 cm. There is a 29 mm stented (TAVR) valve present in the aortic position. Pulmonic Valve: The pulmonic valve was normal in structure. Pulmonic valve regurgitation is trivial. Aorta: The aortic root is normal in size and structure. Venous: The inferior vena cava was not well visualized. IAS/Shunts: No atrial level shunt detected by color flow Doppler.  LEFT VENTRICLE PLAX 2D LVIDd:  4.75 cm   Diastology LVIDs:         3.90 cm   LV e' medial:    8.05 cm/s LV PW:         1.00 cm   LV E/e' medial:  22.1 LV IVS:        1.15 cm   LV e' lateral:   4.24 cm/s LVOT diam:     2.24 cm   LV E/e' lateral: 42.0 LV SV:         72 LV SV Index:   32 LVOT Area:     3.94 cm  LEFT ATRIUM              Index        RIGHT ATRIUM           Index LA Vol (A2C):   107.0 ml 47.09 ml/m  RA Area:     19.80 cm LA Vol (A4C):   90.8 ml  39.96 ml/m  RA Volume:   46.50 ml  20.47 ml/m LA Biplane Vol: 99.4 ml  43.75 ml/m  AORTIC VALVE AV Area (Vmax):    1.72 cm AV Area (Vmean):   1.60 cm AV Area (VTI):     1.47 cm AV Vmax:           228.00 cm/s AV Vmean:          158.000 cm/s AV VTI:            0.490 m AV Peak Grad:      20.8 mmHg AV Mean Grad:      11.0 mmHg LVOT Vmax:         99.80 cm/s LVOT Vmean:        64.100 cm/s LVOT VTI:          0.183 m LVOT/AV VTI ratio: 0.37 AI PHT:            608 msec MITRAL VALVE                TRICUSPID VALVE MV Area (PHT): 3.61 cm     TR Peak grad:   17.6 mmHg MV Area VTI:   1.24 cm     TR Vmax:        210.00 cm/s MV Mean grad:  7.0 mmHg MV VTI:        0.58 m       SHUNTS MV Decel Time: 210 msec     Systemic VTI:  0.18 m MV E velocity: 178.00 cm/s  Systemic Diam: 2.24 cm MV A velocity: 129.00 cm/s MV E/A ratio:  1.38 Dalton McleanMD Electronically signed by Franki Monte Signature Date/Time: 12/07/2022/2:00:45 PM    Final    DG Chest 2 View  Result Date:  12/06/2022 CLINICAL DATA:  chest pain EXAM: CHEST two views COMPARISON:  11/24/2022 FINDINGS: Cardiac silhouette is prominent. There is pulmonary interstitial prominence with vascular congestion. No focal consolidation. No pneumothorax identified. There are small pleural effusions. There are thoracic degenerative changes. There is a prosthetic aortic valve. Implanted electronic device overlies left hemithorax. IMPRESSION: Findings suggest CHF. Electronically Signed   By: Sammie Bench M.D.   On: 12/06/2022 16:15    Cardiac Studies   ECHO: 12/07/22   1. Left ventricular ejection fraction, by estimation, is 55 to 60%. The  left ventricle has normal function. The left ventricle has no regional  wall motion abnormalities but there appears to be septal-lateral  dyssynchrony consistent with LBBB. There is  mild left ventricular  hypertrophy. Left ventricular diastolic parameters  are consistent with Grade II diastolic dysfunction (pseudonormalization).   2. Peak RV-RA gradient 17 mmHg. IVC not visualized. Right ventricular  systolic function is normal. The right ventricular size is normal.   3. Left atrial size was moderately dilated.   4. Right atrial size was mildly dilated.   5. The mitral valve is degenerative. Mild mitral valve regurgitation.  Moderate mitral stenosis. The mean mitral valve gradient is 7.0 mmHg, MVA  1.24 cm^2 (VTI). Moderate mitral annular calcification.   6. 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  Mild peri-valvular regurgitation. Mean gradient 11 mmHg. EOA 1.47 cm^2.   Patient Profile     81 y.o. 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  Perivalvular leak but this still appears mild.  Aortic valve gradient is minimal 2.2 meters per second.  EF appears normal. -I will go ahead and give her IV Lasix 40  mg twice daily.  Perhaps she is slightly overloaded.  Monitor creatinine closely. -She does admit that she was significantly short of breath prior to the TAVR procedure.  Perhaps this is some carryover however she did feel better immediately postprocedure.  Atrial fibrillation is under good control.  Initial BNP 451.  Troponin 55 on repeat from 75.  I will go ahead and let structural team know that she is here as well.    For questions or updates, please contact [place] Please consult [email or web address] for contact info under        Signed, Candee Furbish, MD  12/07/2022, 2:47 PM

## 2022-12-08 ENCOUNTER — Ambulatory Visit: Payer: Medicare Other

## 2022-12-08 DIAGNOSIS — Z1152 Encounter for screening for COVID-19: Secondary | ICD-10-CM | POA: Diagnosis not present

## 2022-12-08 DIAGNOSIS — Z79899 Other long term (current) drug therapy: Secondary | ICD-10-CM | POA: Diagnosis not present

## 2022-12-08 DIAGNOSIS — R0902 Hypoxemia: Secondary | ICD-10-CM | POA: Diagnosis not present

## 2022-12-08 DIAGNOSIS — Z7901 Long term (current) use of anticoagulants: Secondary | ICD-10-CM | POA: Diagnosis not present

## 2022-12-08 DIAGNOSIS — I2510 Atherosclerotic heart disease of native coronary artery without angina pectoris: Secondary | ICD-10-CM | POA: Diagnosis not present

## 2022-12-08 DIAGNOSIS — E8770 Fluid overload, unspecified: Secondary | ICD-10-CM | POA: Diagnosis not present

## 2022-12-08 DIAGNOSIS — I4819 Other persistent atrial fibrillation: Secondary | ICD-10-CM | POA: Diagnosis not present

## 2022-12-08 DIAGNOSIS — I5031 Acute diastolic (congestive) heart failure: Secondary | ICD-10-CM | POA: Diagnosis not present

## 2022-12-08 DIAGNOSIS — N183 Chronic kidney disease, stage 3 unspecified: Secondary | ICD-10-CM | POA: Diagnosis not present

## 2022-12-08 DIAGNOSIS — E1122 Type 2 diabetes mellitus with diabetic chronic kidney disease: Secondary | ICD-10-CM | POA: Diagnosis not present

## 2022-12-08 DIAGNOSIS — B379 Candidiasis, unspecified: Secondary | ICD-10-CM | POA: Diagnosis not present

## 2022-12-08 DIAGNOSIS — I130 Hypertensive heart and chronic kidney disease with heart failure and stage 1 through stage 4 chronic kidney disease, or unspecified chronic kidney disease: Secondary | ICD-10-CM | POA: Diagnosis not present

## 2022-12-08 DIAGNOSIS — I5033 Acute on chronic diastolic (congestive) heart failure: Secondary | ICD-10-CM | POA: Diagnosis not present

## 2022-12-08 MED ORDER — METOPROLOL SUCCINATE ER 25 MG PO TB24: 25.0000 mg | ORAL_TABLET | Freq: Every day | ORAL | 2 refills | Status: DC

## 2022-12-08 MED ORDER — TORSEMIDE 20 MG PO TABS: 20.0000 mg | ORAL_TABLET | Freq: Two times a day (BID) | ORAL | 2 refills | Status: DC

## 2022-12-08 NOTE — Progress Notes (Signed)
Rounding Note    Patient Name: [name] [name] Date of Encounter: 12/08/2022  [place] Cardiologist: None   Subjective   Feeling better.  Less short of breath.  Still feels as though she had significant shortness of breath NYHA class IV type symptoms on Wednesday.   Inpatient Medications    Scheduled Meds:  amiodarone  200 mg Oral q AM   apixaban  5 mg Oral BID   furosemide  40 mg Intravenous BID   metoprolol tartrate  12.5 mg Oral BID   sodium chloride flush  3 mL Intravenous Q12H   Continuous Infusions:  sodium chloride     PRN Meds: sodium chloride, acetaminophen, ondansetron (ZOFRAN) IV, sodium chloride flush   Vital Signs    Vitals:   12/07/22 1956 12/07/22 2356 12/08/22 0448 12/08/22 0813  BP: (!) 152/47 (!) 142/46 (!) 141/50   Pulse: 65 (!) 59 61 64  Resp: '20 19 15 16  '$ Temp: 97.9 F (36.6 C) 97.9 F (36.6 C) 98 F (36.7 C)   TempSrc: Oral Oral Oral   SpO2: 97% 96% 98% 98%  Weight:   121.1 kg   Height:        Intake/Output Summary (Last 24 hours) at 12/08/2022 0855 Last data filed at 12/07/2022 2357 Gross per 24 hour  Intake 180 ml  Output 2100 ml  Net -1920 ml      12/08/2022    4:48 AM 12/07/2022    2:17 PM 12/06/2022    3:05 PM  Last 3 Weights  Weight (lbs) 267 lb 271 lb 13.2 oz 270 lb 1 oz  Weight (kg) 121.11 kg 123.3 kg 122.5 kg      Physical Exam   GEN: No acute distress.   Neck: No JVD Cardiac: RRR, 2/6 systolic murmur, no rubs, or gallops.  Respiratory: Clear to auscultation bilaterally. GI: Soft, nontender, non-distended  MS: No edema; No deformity. Neuro:  Nonfocal  Psych: Normal affect   Labs    High Sensitivity Troponin:   Recent Labs  Lab 12/06/22 1541 12/06/22 1742  TROPONINIHS 75* 55*     Chemistry Recent Labs  Lab 12/06/22 1541 12/07/22 0447  NA 139 139  K 4.3 4.0  CL 107 106  CO2 23 22  GLUCOSE 166* 103*  BUN 11 14  CREATININE 1.04* 1.25*  CALCIUM 9.0 8.6*  MG  --  2.1  GFRNONAA 55* 44*   ANIONGAP 9 11    Lipids No results for input(s): "CHOL", "TRIG", "HDL", "LABVLDL", "LDLCALC", "CHOLHDL" in the last 168 hours.  Hematology Recent Labs  Lab 12/06/22 1541 12/07/22 0447  WBC 11.4* 8.5  RBC 3.77* 3.59*  HGB 11.8* 11.2*  HCT 36.2 34.8*  MCV 96.0 96.9  MCH 31.3 31.2  MCHC 32.6 32.2  RDW 15.7* 15.9*  PLT 189 177   Thyroid No results for input(s): "TSH", "FREET4" in the last 168 hours.  BNP Recent Labs  Lab 12/06/22 1541  BNP 451.5*    DDimer No results for input(s): "DDIMER" in the last 168 hours.   Radiology    ECHOCARDIOGRAM COMPLETE  Result Date: 12/07/2022    ECHOCARDIOGRAM REPORT   Patient Name:   ERIC MORGANTI Date of Exam: 12/07/2022 Medical Rec #:  [id number]   Height:       66.0 in Accession #:    [id number]  Weight:       270.1 lb Date of Birth:  1941/04/18   BSA:  2.272 m Patient Age:    57 years    BP:           145/67 mmHg Patient Gender: F           HR:           76 bpm. Exam Location:  Inpatient Procedure: 2D Echo, Cardiac Doppler and Color Doppler Indications:    s/p tavr  History:        Patient has prior history of Echocardiogram examinations, most                 recent 06/03/2020. Risk Factors:Hypertension, Diabetes and                 Dyslipidemia.  Sonographer:    Melissa Morford RDCS (AE, PE) Referring Phys: [id number] [place]  1. Left ventricular ejection fraction, by estimation, is 55 to 60%. The left ventricle has normal function. The left ventricle has no regional wall motion abnormalities but there appears to be septal-lateral dyssynchrony consistent with LBBB. There is mild left ventricular hypertrophy. Left ventricular diastolic parameters are consistent with Grade II diastolic dysfunction (pseudonormalization).  2. Peak RV-RA gradient 17 mmHg. IVC not visualized. Right ventricular systolic function is normal. The right ventricular size is normal.  3. Left atrial size was moderately dilated.  4. Right atrial size was  mildly dilated.  5. The mitral valve is degenerative. Mild mitral valve regurgitation. Moderate mitral stenosis. The mean mitral valve gradient is 7.0 mmHg, MVA 1.24 cm^2 (VTI). Moderate mitral annular calcification.  6. 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 Mild peri-valvular regurgitation. Mean gradient 11 mmHg. EOA 1.47 cm^2. FINDINGS  Left Ventricle: Left ventricular ejection fraction, by estimation, is 55 to 60%. The left ventricle has normal function. The left ventricle has no regional wall motion abnormalities. Definity contrast agent was given IV to delineate the left ventricular  endocardial borders. The left ventricular internal cavity size was normal in size. There is mild left ventricular hypertrophy. Left ventricular diastolic parameters are consistent with Grade II diastolic dysfunction (pseudonormalization). Right Ventricle: Peak RV-RA gradient 17 mmHg. IVC not visualized. The right ventricular size is normal. No increase in right ventricular wall thickness. Right ventricular systolic function is normal. Left Atrium: Left atrial size was moderately dilated. Right Atrium: Right atrial size was mildly dilated. Pericardium: There is no evidence of pericardial effusion. Mitral Valve: The mitral valve is degenerative in appearance. There is moderate thickening of the mitral valve leaflet(s). There is moderate calcification of the mitral valve leaflet(s). Moderate mitral annular calcification. Mild mitral valve regurgitation. Moderate mitral valve stenosis. The mean mitral valve gradient is 7.0 mmHg. Tricuspid Valve: The tricuspid valve is normal in structure. Tricuspid valve regurgitation is trivial. 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 Mild peri-valvular regurgitation. Mean gradient 11 mmHg. EOA 1.47 cm^2. The aortic valve has been repaired/replaced. Aortic valve regurgitation is mild. Aortic regurgitation PHT measures 608 msec.  Aortic valve mean gradient measures 11.0 mmHg. Aortic valve peak gradient measures 20.8 mmHg. Aortic valve area, by VTI measures 1.47 cm. There is a 29 mm stented (TAVR) valve present in the aortic position. Pulmonic Valve: The pulmonic valve was normal in structure. Pulmonic valve regurgitation is trivial. Aorta: The aortic root is normal in size and structure. Venous: The inferior vena cava was not well visualized. IAS/Shunts: No atrial level shunt detected by color flow Doppler.  LEFT VENTRICLE PLAX 2D LVIDd:  4.75 cm   Diastology LVIDs:         3.90 cm   LV e' medial:    8.05 cm/s LV PW:         1.00 cm   LV E/e' medial:  22.1 LV IVS:        1.15 cm   LV e' lateral:   4.24 cm/s LVOT diam:     2.24 cm   LV E/e' lateral: 42.0 LV SV:         72 LV SV Index:   32 LVOT Area:     3.94 cm  LEFT ATRIUM              Index        RIGHT ATRIUM           Index LA Vol (A2C):   107.0 ml 47.09 ml/m  RA Area:     19.80 cm LA Vol (A4C):   90.8 ml  39.96 ml/m  RA Volume:   46.50 ml  20.47 ml/m LA Biplane Vol: 99.4 ml  43.75 ml/m  AORTIC VALVE AV Area (Vmax):    1.72 cm AV Area (Vmean):   1.60 cm AV Area (VTI):     1.47 cm AV Vmax:           228.00 cm/s AV Vmean:          158.000 cm/s AV VTI:            0.490 m AV Peak Grad:      20.8 mmHg AV Mean Grad:      11.0 mmHg LVOT Vmax:         99.80 cm/s LVOT Vmean:        64.100 cm/s LVOT VTI:          0.183 m LVOT/AV VTI ratio: 0.37 AI PHT:            608 msec MITRAL VALVE                TRICUSPID VALVE MV Area (PHT): 3.61 cm     TR Peak grad:   17.6 mmHg MV Area VTI:   1.24 cm     TR Vmax:        210.00 cm/s MV Mean grad:  7.0 mmHg MV VTI:        0.58 m       SHUNTS MV Decel Time: 210 msec     Systemic VTI:  0.18 m MV E velocity: 178.00 cm/s  Systemic Diam: 2.24 cm MV A velocity: 129.00 cm/s MV E/A ratio:  1.38 Dalton McleanMD Electronically signed by Franki Monte Signature Date/Time: 12/07/2022/2:00:45 PM    Final    DG Chest 2 View  Result Date:  12/06/2022 CLINICAL DATA:  chest pain EXAM: CHEST two views COMPARISON:  11/24/2022 FINDINGS: Cardiac silhouette is prominent. There is pulmonary interstitial prominence with vascular congestion. No focal consolidation. No pneumothorax identified. There are small pleural effusions. There are thoracic degenerative changes. There is a prosthetic aortic valve. Implanted electronic device overlies left hemithorax. IMPRESSION: Findings suggest CHF. Electronically Signed   By: Sammie Bench M.D.   On: 12/06/2022 16:15    Cardiac Studies   ECHO: 12/07/22   1. Left ventricular ejection fraction, by estimation, is 55 to 60%. The  left ventricle has normal function. The left ventricle has no regional  wall motion abnormalities but there appears to be septal-lateral  dyssynchrony consistent with LBBB. There is  mild left ventricular  hypertrophy. Left ventricular diastolic parameters  are consistent with Grade II diastolic dysfunction (pseudonormalization).   2. Peak RV-RA gradient 17 mmHg. IVC not visualized. Right ventricular  systolic function is normal. The right ventricular size is normal.   3. Left atrial size was moderately dilated.   4. Right atrial size was mildly dilated.   5. The mitral valve is degenerative. Mild mitral valve regurgitation.  Moderate mitral stenosis. The mean mitral valve gradient is 7.0 mmHg, MVA  1.24 cm^2 (VTI). Moderate mitral annular calcification.   6. 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  Mild peri-valvular regurgitation. Mean gradient 11 mmHg. EOA 1.47 cm^2.  Stable echocardiogram  Patient Profile     81 y.o. 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  Perivalvular leak but this still appears mild.  Aortic valve gradient is minimal 2.2 meters per second.  EF appears normal. -Lasix IV 40 mg twice  a day administered yesterday. Creatinine slightly increased today from 1.04-1.25.  Approximately 2 L out. -At home she had torsemide 20 mg once a day.  We will change that to twice a day for the time being. -She does admit that she was significantly short of breath prior to the TAVR procedure.  Perhaps this is some carryover however she did feel better immediately postprocedure.  Atrial fibrillation is under good control. -Structural team aware. Initial BNP 451.  Troponin 55 on repeat from 75.  Not ACS.  This is myocardial injury in the setting of recent diastolic heart failure.  Paroxysmal atrial fibrillation - Currently on amiodarone 200 mg a day.  She is maintaining sinus rhythm.  Continue with Eliquis 5 mg twice a day for anticoagulation.  She is also on low-dose metoprolol 12.5 twice daily.   -She should have close follow-up with the structural team soon post TAVR.  I am comfortable with her discharge home.  Family members at bedside.  She is eager to go home so that tomorrow she can make cookies with her grand children. Her daughter in room has a defibrillator.  When she was in her mid 73s she had sudden cardiac arrest which was from hypokalemia.  For questions or updates, please contact [place] Please consult [email or web address] for contact info under        Signed, Candee Furbish, MD  12/08/2022, 8:55 AM

## 2022-12-08 NOTE — TOC Transition Note (Signed)
Transition of Care [facility] - CM/SW Discharge Note   Patient Details  Name: [name] [name] MRN: [id number] Date of Birth: 1941-06-27  [facility] CM/SW Contact:  Zenon Mayo, RN Phone Number: 12/08/2022, 11:58 AM   Clinical Narrative:    Patient is for dc today, her spouse is at the bedside, he will transport her home today.  She has a scale and bp cuff at home , she eats a low sodium diet.  She has no needs.          Patient Goals and CMS Choice      Discharge Placement                         Discharge Plan and Services Additional resources added to the After Visit Summary for                                       Social Determinants of Health (SDOH) Interventions SDOH Screenings   Food Insecurity: No Food Insecurity (12/07/2022)  Housing: Low Risk  (12/07/2022)  Transportation Needs: No Transportation Needs (12/07/2022)  Utilities: Not At Risk (12/07/2022)  Tobacco Use: Low Risk  (12/06/2022)     Readmission Risk Interventions     No data to display

## 2022-12-08 NOTE — Discharge Summary (Addendum)
Discharge Summary    Patient ID: KADIE BALESTRIERI MRN: [id number]; DOB: 07-06-1941  Admit date: 12/06/2022 Discharge date: 12/08/2022  PCP:  Glenis Smoker, [place] [facility] Cardiologist:  None  Electrophysiologist:  Vickie Epley, MD  {    Discharge Diagnoses    Principal Problem:   Acute exacerbation of CHF (congestive heart failure) ([place]) Active Problems:   Acute exacerbation of congestive heart failure ([place])    Diagnostic Studies/Procedures    Echo on 12/07/22:  1. Left ventricular ejection fraction, by estimation, is 55 to 60%. The  left ventricle has normal function. The left ventricle has no regional  wall motion abnormalities but there appears to be septal-lateral  dyssynchrony consistent with LBBB. There is  mild left ventricular hypertrophy. Left ventricular diastolic parameters  are consistent with Grade II diastolic dysfunction (pseudonormalization).   2. Peak RV-RA gradient 17 mmHg. IVC not visualized. Right ventricular  systolic function is normal. The right ventricular size is normal.   3. Left atrial size was moderately dilated.   4. Right atrial size was mildly dilated.   5. The mitral valve is degenerative. Mild mitral valve regurgitation.  Moderate mitral stenosis. The mean mitral valve gradient is 7.0 mmHg, MVA  1.24 cm^2 (VTI). Moderate mitral annular calcification.   6. 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  Mild peri-valvular regurgitation. Mean gradient 11 mmHg. EOA 1.47 cm^2.    Echo 11/29/22:   1. There is a 29 mm stented (TAVR) valve present in the aortic position.  Procedure Date: 11/28/2022. Effective orifice area, by VTI measures 2.23  cm. Aortic valve mean gradient measures 15.0 mmHg. Peak gradient 29 mm  Hg, DVI 0.45. Mild PVL adjacten to  the intraventricular septum.   2. Left ventricular ejection fraction, by estimation, is 60 to 65%. The  left ventricle has normal  function. The left ventricle has no regional  wall motion abnormalities. There is mild concentric left ventricular  hypertrophy. Left ventricular diastolic  function could not be evaluated.   3. Right ventricular systolic function is normal. The right ventricular  size is normal. Tricuspid regurgitation signal is inadequate for assessing  PA pressure.   4. Left atrial size was moderately dilated.   5. The mitral valve is degenerative. Trivial mitral valve regurgitation.  The mean mitral valve gradient is 5.0 mmHg.    History of Present Illness     Per admission H&P 12/06/22:  EUGENE ZEIDERS is a 81 y.o. female with history of morbid obesity, arthritis, HTN, DM 2, persistent atrial fibrillation, nonobstructive CAD, GERD, CKD 3, chronic lower extremity wounds, and severe AAS status post TAVR on 11/28/2022 who was seen on 12/06/2022 for the evaluation of shortness of breath.   Noted that her symptoms were progressive and 12/06/22 was the first time she experienced shortness of breath at rest. Noted that in the days before she was able to walk some however she was noting the distances that she could walk or getting shorter and shorter.  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  She did note 12/06/22 some chest tightness but not severe.  Noted that she was almost never that fatigued after minimal work.  Has not had any other new symptoms, such as fevers, chills, abdominal pain, nausea, or vomiting.   She was discharged from  the hospital for TAVR procedure on 11/29/2022.  She noted that she did well for several days after the TAVR.  Postoperative echo completed and noted well-functioning valve and a normal EF.  Her postprocedural ECG did have a new LBBB.  Given that she was sent home with a Zio patch to ensure that she did not have any H AVB after the procedure.   On 12/01/2022 it does look that she was  noted to have high-grade AV block for the first time but was asleep.  At that time had not noted any of the symptoms yet.  On 5/17, was noted that she had 90 seconds of A-fib with slow ventricular response.  Heart rate was 40s to 50s.   On arrival to the emergency department, she was hemodynamically stable however was requiring some oxygen initially.  Initial workup included a BNP that was elevated to 451.  Initial troponin was 75  and repeat was 55.  Otherwise unremarkable.  She was given a dose of diuretics in the emergency department with good urine output, but inability to come off of oxygen.  Thus cardiology was called for admission.    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 Moderate mitral stenosis. The mean mitral valve gradient is 7.0 mmHg, MVA 1.24 cm^2 (VTI). 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 Mild peri-valvular regurgitation. Mean gradient 11 mmHg. EOA 1.47 cm^2. (Stable comparing to post TAVR Echo) - Structural team was made aware of this admission by MD - s/p IV diuresis with Lasix, Net - 2.5L , weight is down from 270 >267 ibs since admission, feels improved and less SOB, Cr up trending from 1.04 to 1.25, recommend transition to PO torsemide '20mg'$  BID at time of discharge today by Dr Marlou Porch - GDMT: on PTA metoprolol 12.'5mg'$  BID , will transition to PO Metoprolol XL '25mg'$  daily; given rising renal index, hold off adding ARNI/MRA/SGLT2I for now, may consider at follow up visit  - Follow up with  structural team arranged on 01/08/22   Persistent A fib - currently in sinus rhythm, rate controlled on beta blocker, noted first degree AVB with ventricular rate of 50s on monitor this admission, no high grade AVB,  continue home eliquis and metoprolol and amiodarone, if further bradycardia occurs, consider stop amiodarone and metoprolol   Nonobstructive CAD -No angina -Shortness of breath is from decompensated heart failure -Not on aspirin due to therapeutic anticoagulation; not on statin historically and no recent lipid panel, consider fasting lipid panel at next follow appt; continue beta blocker   HTN - BP fairly controlled given her age, continue metoprolol as above  CKD stage III -Serum creatinine slightly rising 1.25 from 1.04 today, consider repeat BMP at the next follow-up visit -Avoid nephrotoxic agent  Type 2 diabetes -No recent hemoglobin A1c and not home meds -Continue carb controlled diet and follow-up with PCP     Did the patient have an acute coronary syndrome (MI, NSTEMI, STEMI, etc) this admission?:  No  Did the patient have a percutaneous coronary intervention (stent / angioplasty)?:  No.        The patient will be scheduled for a TOC follow up appointment in 14 days.  A message has been sent to the [facility] and Scheduling Pool at the office where the patient should be seen for follow up.  _____________  Discharge Vitals Blood pressure (!) 141/50, pulse 64, temperature 98 F (36.7 C), temperature source Oral, resp. rate 16, height '5\' 6"'$  (1.676 m), weight 121.1 kg, SpO2 98 %.  Filed Weights   12/06/22 1505 12/07/22 1417 12/08/22 0448  Weight: 122.5 kg 123.3 kg 121.1 kg   See attending rounding note for exam today   Labs & Radiologic Studies    CBC Recent Labs    12/06/22 1541 12/07/22 0447  WBC 11.4* 8.5  NEUTROABS  --  6.4  HGB 11.8* 11.2*  HCT 36.2 34.8*  MCV 96.0 96.9  PLT 189 239   Basic Metabolic Panel Recent  Labs    12/06/22 1541 12/07/22 0447  NA 139 139  K 4.3 4.0  CL 107 106  CO2 23 22  GLUCOSE 166* 103*  BUN 11 14  CREATININE 1.04* 1.25*  CALCIUM 9.0 8.6*  MG  --  2.1   Liver Function Tests No results for input(s): "AST", "ALT", "ALKPHOS", "BILITOT", "PROT", "ALBUMIN" in the last 72 hours. No results for input(s): "LIPASE", "AMYLASE" in the last 72 hours. High Sensitivity Troponin:   Recent Labs  Lab 12/06/22 1541 12/06/22 1742  TROPONINIHS 75* 55*    BNP Invalid input(s): "POCBNP" D-Dimer No results for input(s): "DDIMER" in the last 72 hours. Hemoglobin A1C No results for input(s): "HGBA1C" in the last 72 hours. Fasting Lipid Panel No results for input(s): "CHOL", "HDL", "LDLCALC", "TRIG", "CHOLHDL", "LDLDIRECT" in the last 72 hours. Thyroid Function Tests No results for input(s): "TSH", "T4TOTAL", "T3FREE", "THYROIDAB" in the last 72 hours.  Invalid input(s): "FREET3" _____________  ECHOCARDIOGRAM COMPLETE  Result Date: 12/07/2022    ECHOCARDIOGRAM REPORT   Patient Name:   MARI BATTAGLIA Date of Exam: 12/07/2022 Medical Rec #:  [id number]   Height:       66.0 in Accession #:    [id number]  Weight:       270.1 lb Date of Birth:  07/03/1941   BSA:          2.272 m Patient Age:    62 years    BP:           145/67 mmHg Patient Gender: F           HR:           76 bpm. Exam Location:  Inpatient Procedure: 2D Echo, Cardiac Doppler and Color Doppler Indications:    s/p tavr  History:        Patient has prior history of Echocardiogram examinations, most                 recent 06/03/2020. Risk Factors:Hypertension, Diabetes and                 Dyslipidemia.  Sonographer:    Melissa Morford RDCS (AE, PE) Referring Phys: [id number] [place]  1. Left ventricular ejection fraction, by estimation, is 55 to 60%. The left ventricle has normal function. The left ventricle has no regional wall motion abnormalities but there appears to be septal-lateral dyssynchrony consistent  with LBBB. There is mild left ventricular hypertrophy. Left ventricular diastolic parameters are  consistent with Grade II diastolic dysfunction (pseudonormalization).  2. Peak RV-RA gradient 17 mmHg. IVC not visualized. Right ventricular systolic function is normal. The right ventricular size is normal.  3. Left atrial size was moderately dilated.  4. Right atrial size was mildly dilated.  5. The mitral valve is degenerative. Mild mitral valve regurgitation. Moderate mitral stenosis. The mean mitral valve gradient is 7.0 mmHg, MVA 1.24 cm^2 (VTI). Moderate mitral annular calcification.  6. 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 Mild peri-valvular regurgitation. Mean gradient 11 mmHg. EOA 1.47 cm^2. FINDINGS  Left Ventricle: Left ventricular ejection fraction, by estimation, is 55 to 60%. The left ventricle has normal function. The left ventricle has no regional wall motion abnormalities. Definity contrast agent was given IV to delineate the left ventricular  endocardial borders. The left ventricular internal cavity size was normal in size. There is mild left ventricular hypertrophy. Left ventricular diastolic parameters are consistent with Grade II diastolic dysfunction (pseudonormalization). Right Ventricle: Peak RV-RA gradient 17 mmHg. IVC not visualized. The right ventricular size is normal. No increase in right ventricular wall thickness. Right ventricular systolic function is normal. Left Atrium: Left atrial size was moderately dilated. Right Atrium: Right atrial size was mildly dilated. Pericardium: There is no evidence of pericardial effusion. Mitral Valve: The mitral valve is degenerative in appearance. There is moderate thickening of the mitral valve leaflet(s). There is moderate calcification of the mitral valve leaflet(s). Moderate mitral annular calcification. Mild mitral valve regurgitation. Moderate mitral valve stenosis. The mean mitral valve gradient is 7.0 mmHg.  Tricuspid Valve: The tricuspid valve is normal in structure. Tricuspid valve regurgitation is trivial. 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 Mild peri-valvular regurgitation. Mean gradient 11 mmHg. EOA 1.47 cm^2. The aortic valve has been repaired/replaced. Aortic valve regurgitation is mild. Aortic regurgitation PHT measures 608 msec. Aortic valve mean gradient measures 11.0 mmHg. Aortic valve peak gradient measures 20.8 mmHg. Aortic valve area, by VTI measures 1.47 cm. There is a 29 mm stented (TAVR) valve present in the aortic position. Pulmonic Valve: The pulmonic valve was normal in structure. Pulmonic valve regurgitation is trivial. Aorta: The aortic root is normal in size and structure. Venous: The inferior vena cava was not well visualized. IAS/Shunts: No atrial level shunt detected by color flow Doppler.  LEFT VENTRICLE PLAX 2D LVIDd:         4.75 cm   Diastology LVIDs:         3.90 cm   LV e' medial:    8.05 cm/s LV PW:         1.00 cm   LV E/e' medial:  22.1 LV IVS:        1.15 cm   LV e' lateral:   4.24 cm/s LVOT diam:     2.24 cm   LV E/e' lateral: 42.0 LV SV:         72 LV SV Index:   32 LVOT Area:     3.94 cm  LEFT ATRIUM              Index        RIGHT ATRIUM           Index LA Vol (A2C):   107.0 ml 47.09 ml/m  RA Area:     19.80 cm LA Vol (A4C):   90.8 ml  39.96 ml/m  RA Volume:   46.50 ml  20.47 ml/m LA Biplane Vol: 99.4 ml  43.75 ml/m  AORTIC VALVE AV Area (Vmax):    1.72 cm AV Area (Vmean):   1.60 cm AV Area (VTI):     1.47 cm AV Vmax:           228.00 cm/s AV Vmean:          158.000 cm/s AV VTI:            0.490 m AV Peak Grad:      20.8 mmHg AV Mean Grad:      11.0 mmHg LVOT Vmax:         99.80 cm/s LVOT Vmean:        64.100 cm/s LVOT VTI:          0.183 m LVOT/AV VTI ratio: 0.37 AI PHT:            608 msec MITRAL VALVE                TRICUSPID VALVE MV Area (PHT): 3.61 cm     TR Peak grad:   17.6 mmHg MV Area VTI:   1.24 cm     TR Vmax:         210.00 cm/s MV Mean grad:  7.0 mmHg MV VTI:        0.58 m       SHUNTS MV Decel Time: 210 msec     Systemic VTI:  0.18 m MV E velocity: 178.00 cm/s  Systemic Diam: 2.24 cm MV A velocity: 129.00 cm/s MV E/A ratio:  1.38 Dalton McleanMD Electronically signed by Franki Monte Signature Date/Time: 12/07/2022/2:00:45 PM    Final    DG Chest 2 View  Result Date: 12/06/2022 CLINICAL DATA:  chest pain EXAM: CHEST two views COMPARISON:  11/24/2022 FINDINGS: Cardiac silhouette is prominent. There is pulmonary interstitial prominence with vascular congestion. No focal consolidation. No pneumothorax identified. There are small pleural effusions. There are thoracic degenerative changes. There is a prosthetic aortic valve. Implanted electronic device overlies left hemithorax. IMPRESSION: Findings suggest CHF. Electronically Signed   By: Sammie Bench M.D.   On: 12/06/2022 16:15   ECHOCARDIOGRAM COMPLETE  Result Date: 11/29/2022    ECHOCARDIOGRAM REPORT   Patient Name:   SIMA LINDENBERGER Date of Exam: 11/29/2022 Medical Rec #:  [id number]   Height:       66.0 in Accession #:    [id number]  Weight:       270.2 lb Date of Birth:  1941/04/25   BSA:          2.273 m Patient Age:    66 years    BP:           150/51 mmHg Patient Gender: F           HR:           76 bpm. Exam Location:  Inpatient Procedure: 2D Echo, Cardiac Doppler and Color Doppler Indications:    I35.0 Nonrheumatic aortic (valve) stenosis  History:        Patient has prior history of Echocardiogram examinations, most                 recent 11/28/2022. Aortic Valve Disease; Risk                 Factors:Hypertension and Diabetes. Aortic stenosis.                 Aortic Valve: 29 mm stented (TAVR) valve is present in the  aortic position. Procedure Date: 11/28/2022.  Sonographer:    Roseanna Rainbow RDCS Referring Phys: [id number] [facility]  Sonographer Comments: Patient is obese. Image acquisition challenging due to patient body habitus.  IMPRESSIONS  1. There is a 29 mm stented (TAVR) valve present in the aortic position. Procedure Date: 11/28/2022. Effective orifice area, by VTI measures 2.23 cm. Aortic valve mean gradient measures 15.0 mmHg. Peak gradient 29 mm Hg, DVI 0.45. Mild PVL adjacten to the intraventricular septum.  2. Left ventricular ejection fraction, by estimation, is 60 to 65%. The left ventricle has normal function. The left ventricle has no regional wall motion abnormalities. There is mild concentric left ventricular hypertrophy. Left ventricular diastolic function could not be evaluated.  3. Right ventricular systolic function is normal. The right ventricular size is normal. Tricuspid regurgitation signal is inadequate for assessing PA pressure.  4. Left atrial size was moderately dilated.  5. The mitral valve is degenerative. Trivial mitral valve regurgitation. The mean mitral valve gradient is 5.0 mmHg. Comparison(s): Slight increase in gradients with no change in PVL. FINDINGS  Left Ventricle: Left ventricular ejection fraction, by estimation, is 60 to 65%. The left ventricle has normal function. The left ventricle has no regional wall motion abnormalities. The left ventricular internal cavity size was normal in size. There is  mild concentric left ventricular hypertrophy. Left ventricular diastolic function could not be evaluated due to mitral annular calcification (moderate or greater). Left ventricular diastolic function could not be evaluated. Right Ventricle: The right ventricular size is normal. No increase in right ventricular wall thickness. Right ventricular systolic function is normal. Tricuspid regurgitation signal is inadequate for assessing PA pressure. The tricuspid regurgitant velocity is 2.74 m/s, and with an assumed right atrial pressure of 8 mmHg, the estimated right ventricular systolic pressure is 09.4 mmHg. Left Atrium: Left atrial size was moderately dilated. Right Atrium: Right atrial size was normal in  size. Pericardium: Trivial pericardial effusion is present. The pericardial effusion is posterior to the left ventricle. Mitral Valve: The mitral valve is degenerative in appearance. Trivial mitral valve regurgitation. MV peak gradient, 13.1 mmHg. The mean mitral valve gradient is 5.0 mmHg. Tricuspid Valve: The tricuspid valve is normal in structure. Tricuspid valve regurgitation is trivial. Aortic Valve: The aortic valve has been repaired/replaced. Aortic valve regurgitation is mild. Aortic regurgitation PHT measures 2459 msec. Aortic valve mean gradient measures 15.0 mmHg. Aortic valve peak gradient measures 28.9 mmHg. Aortic valve area, by VTI measures 2.23 cm. There is a 29 mm stented (TAVR) valve present in the aortic position. Procedure Date: 11/28/2022. Pulmonic Valve: The pulmonic valve was not well visualized. Pulmonic valve regurgitation is not visualized. No evidence of pulmonic stenosis. Aorta: The aortic root and ascending aorta are structurally normal, with no evidence of dilitation. IAS/Shunts: No atrial level shunt detected by color flow Doppler.  LEFT VENTRICLE PLAX 2D LVIDd:         4.80 cm   Diastology LVIDs:         3.03 cm   LV e' medial:    3.86 cm/s LV PW:         1.10 cm   LV E/e' medial:  32.6 LV IVS:        1.10 cm   LV e' lateral:   4.35 cm/s LVOT diam:     2.50 cm   LV E/e' lateral: 29.0 LV SV:         110 LV SV Index:   49 LVOT Area:  4.91 cm  RIGHT VENTRICLE             IVC RV S prime:     12.80 cm/s  IVC diam: 1.60 cm TAPSE (M-mode): 3.3 cm LEFT ATRIUM              Index        RIGHT ATRIUM           Index LA diam:        4.10 cm  1.80 cm/m   RA Area:     18.90 cm LA Vol (A2C):   100.0 ml 44.00 ml/m  RA Volume:   49.30 ml  21.69 ml/m LA Vol (A4C):   95.9 ml  42.20 ml/m LA Biplane Vol: 102.0 ml 44.88 ml/m  AORTIC VALVE AV Area (Vmax):    2.21 cm AV Area (Vmean):   2.32 cm AV Area (VTI):     2.23 cm AV Vmax:           269.00 cm/s AV Vmean:          179.000 cm/s AV VTI:             0.496 m AV Peak Grad:      28.9 mmHg AV Mean Grad:      15.0 mmHg LVOT Vmax:         121.00 cm/s LVOT Vmean:        84.500 cm/s LVOT VTI:          0.225 m LVOT/AV VTI ratio: 0.45 AI PHT:            2459 msec  AORTA Ao Root diam: 3.10 cm Ao Asc diam:  3.20 cm MITRAL VALVE                TRICUSPID VALVE MV Area (PHT): 2.37 cm     TR Peak grad:   30.0 mmHg MV Area VTI:   1.98 cm     TR Vmax:        274.00 cm/s MV Peak grad:  13.1 mmHg MV Mean grad:  5.0 mmHg     SHUNTS MV Vmax:       1.81 m/s     Systemic VTI:  0.22 m MV Vmean:      109.0 cm/s   Systemic Diam: 2.50 cm MV Decel Time: 320 msec MV E velocity: 126.00 cm/s MV A velocity: 164.00 cm/s MV E/A ratio:  0.77 Rudean Haskell MD Electronically signed by Rudean Haskell MD Signature Date/Time: 11/29/2022/5:46:11 PM    Final    ECHOCARDIOGRAM LIMITED  Result Date: 11/28/2022    ECHOCARDIOGRAM LIMITED REPORT   Patient Name:   VICTOIRE DEANS Date of Exam: 11/28/2022 Medical Rec #:  [id number]   Height:       66.0 in Accession #:    [id number]  Weight:       272.0 lb Date of Birth:  March 26, 1941   BSA:          2.279 m Patient Age:    74 years    BP:           164/70 mmHg Patient Gender: F           HR:           77 bpm. Exam Location:  Inpatient Procedure: Limited Color Doppler and Cardiac Doppler Indications:     Aortic stenosis I35.0  History:         Patient has prior history of Echocardiogram examinations, most  recent 09/27/2022. CAD and Previous Myocardial Infarction,                  Arrythmias:Atrial Fibrillation, Signs/Symptoms:Dyspnea; Risk                  Factors:Diabetes, Hypertension and GERD.  Sonographer:     Bernadene Person RDCS Referring Phys:  Burnell Blanks Diagnosing Phys: Rudean Haskell MD  Sonographer Comments: 38m Medtronic Evolut FX placed. IMPRESSIONS  1. Interventional TTE for TAVR Placement.  2. Prior to procedure there was calcified aortic stenosis. Mean gradient 23 mm Hg, Peak gradient 39 mm  Hg, DVI 0.27 without AI. AVA 1.12 cm2 but consistent with moderate to severe aortic stenosis.  3. After procedure a 29 mm Evolute valve placed. Deep depolyment without evidence of chordal interactions or systolic anterior motion of the mitral valve. Mean gradient of 2 mm, Peak gradient of 4 mm Hg with normal EOA and DVI. Ther was mild PVL adjacent to the intraventricular septum.  4. Left ventricular ejection fraction, by estimation, is 60 to 65%. The left ventricle has normal function.  5. Right ventricular systolic function is normal. The right ventricular size is normal.  6. The mitral valve is degenerative. Trivial mitral valve regurgitation. Severe mitral annular calcification. Comparison(s): Successful TAVR Placement. FINDINGS  Left Ventricle: Left ventricular ejection fraction, by estimation, is 60 to 65%. The left ventricle has normal function. Right Ventricle: The right ventricular size is normal. No increase in right ventricular wall thickness. Right ventricular systolic function is normal. Mitral Valve: The mitral valve is degenerative in appearance. Severe mitral annular calcification. Trivial mitral valve regurgitation. Aortic Valve: Aortic valve mean gradient measures 2.0 mmHg. Aortic valve peak gradient measures 4.2 mmHg. Aortic valve area, by VTI measures 5.52 cm. Additional Comments: Spectral Doppler performed. Color Doppler performed.  LEFT VENTRICLE PLAX 2D LVOT diam:     2.60 cm LV SV:         126 LV SV Index:   55 LVOT Area:     5.31 cm  AORTIC VALVE AV Area (Vmax):    5.99 cm AV Area (Vmean):   5.44 cm AV Area (VTI):     5.52 cm AV Vmax:           102.00 cm/s AV Vmean:          66.700 cm/s AV VTI:            0.229 m AV Peak Grad:      4.2 mmHg AV Mean Grad:      2.0 mmHg LVOT Vmax:         115.00 cm/s LVOT Vmean:        68.400 cm/s LVOT VTI:          0.238 m LVOT/AV VTI ratio: 1.04  SHUNTS Systemic VTI:  0.24 m Systemic Diam: 2.60 cm MRudean HaskellMD Electronically signed by MRudean HaskellMD Signature Date/Time: 11/28/2022/5:10:50 PM    Final    Structural Heart Procedure  Result Date: 11/28/2022 See surgical note for result.  DG Chest 2 View  Result Date: 11/25/2022 CLINICAL DATA:  [id number]-op exam [id number]: CHEST - 2 VIEW COMPARISON:  11/03/2022 FINDINGS: Cardiac silhouette enlarged. No evidence of pneumothorax or pleural effusion. No evidence of pulmonary edema. There are thoracic degenerative changes. IMPRESSION: Enlarged cardiac silhouette. Electronically Signed   By: JSammie BenchM.D.   On: 11/25/2022 13:09   Disposition   Patient was seen by Dr. SMarlou Porchtoday, deemed stable  for discharge.  Discharge instruction, medication change, follow-up appointment had been discussed with the patient , agreeable. Pt is being discharged home today in good condition.  Follow-up Plans & Appointments     Follow-up [facility]. [facility] Follow up on 01/08/2023.   Specialty: Cardiology Why: at 9:15 for your post TAVR follow-up Contact information: [address] [place] [phone]               Discharge Instructions     Diet - low sodium heart healthy   Complete by: As directed    Discharge instructions   Complete by: As directed    HEART FAILURE INSTRUCTIONS   Follow a low salt diet which means you need to consume less than 2 grams ('2000mg'$ ) of sodium per day.  Check the labels!  You'll be surprised to see how much sodium is in common foods and drinks.  DO NOT EAT foods high in salt, such as salted nuts, crackers, smoked fish, fast food, deli meats, salami, pepperoni, jerky, prosciutto, ham, bacon, soups, prepared frozen dinners.  DO NOT add salt to your food when cooking or at the table.  It is ok to use other seasonings and flavors as long as they are salt free.    Drink less than 8 cups (which is ~2058m or 2 liters) of  fluid per day.  This is equivalent to ~ 4 bottles of water a day.  Weigh yourself every morning before breakfast and write it down in a log.  Bring in your record to every clinic visit.  Check for swelling in your feet, ankles, legs and stomach every morning.  Take an extra dose of your fluid pill once a day for worsening shortness of breath, swelling, or >3lb weight gain in 24 hours or >5lb weight gain in 1 week.  Control your blood pressure.  If you have a blood pressure cuff, record your blood pressure & heart rate daily. [facility]  Goal BP is <140/90  Control your blood sugars if you are diabetic  Control your cholesterol.  The goal LDL (bad cholesterol) is <70  Lose weight if you are overweight  Exercise as much as you are able to strengthen your heart  Take your medicine the way you are instructed   [facility] and [facility]   Get a yearly flu shot to reduce your risk of the flu   If you are under age 137you need the Pneumovax pneumonia vaccine as a one time shot to reduce the risk of pneumonia   If you are 681yrs or older you need the Prevnar pneumonia vaccine as a one time shot followed by a Pneumovax pneumonia vaccine > 1 year later as a one time shot to help reduce your risk of pneumonia   Call nurse if you have trouble paying for your medications, are running low on pills, having trouble with transportation or if you are gaining weight quickly, having significant swelling, trouble breathing, chest pain, dizziness or fainting spells.          Increase activity slowly   Complete by: As directed         Discharge Medications   Allergies as of 12/08/2022       Reactions   Tetanus-diphtheria Toxoids Td Other (See Comments)  Felt very sick   Cardizem [diltiazem] Rash        Medication List     STOP taking these medications    metoprolol tartrate 25 MG tablet Commonly known as:  LOPRESSOR       TAKE these medications    acetaminophen 500 MG tablet Commonly known as: TYLENOL Take 1,000 mg by mouth daily as needed for moderate pain or headache.   amiodarone 200 MG tablet Commonly known as: PACERONE Take 1 tablet (200 mg total) by mouth daily. What changed: when to take this   Blue-Emu Super Strength Crea Apply 1 application  topically daily as needed (arthritis).   Calcium 600+D 600-20 MG-MCG Tabs Generic drug: Calcium Carb-Cholecalciferol Take 1 tablet by mouth every evening.   CHLORASEPTIC MT Use as directed 1 spray in the mouth or throat at bedtime.   CMKLKJZPH-15/AVWP EX Apply 1 application topically 2 (two) times daily as needed (itching).   Eliquis 5 MG Tabs tablet Generic drug: apixaban TAKE 1 TABLET BY MOUTH TWICE A DAY What changed: how much to take   HALLS COUGH DROPS MT Use as directed 1-2 each in the mouth or throat every 'Sunday.   metoprolol succinate 25 MG 24 hr tablet Commonly known as: Toprol XL Take 1 tablet (25 mg total) by mouth daily.   Potassium Chloride ER 20 MEQ Tbcr Take 2 tablets by mouth daily. What changed:  how much to take when to take this   PRESERVISION AREDS 2 PO Take 1 capsule by mouth 2 (two) times daily.   ONE A DAY WOMEN 50 PLUS PO Take 1 tablet by mouth every evening.   torsemide 20 MG tablet Commonly known as: DEMADEX Take 1 tablet (20 mg total) by mouth 2 (two) times daily. What changed: when to take this           Outstanding Labs/Studies    Duration of Discharge Encounter   Greater than 30 minutes including physician time.  Signed, Xika Zhao, NP 12/08/2022, 11:38 AM   Personally seen and examined. Agree with above.   Subjective    Feeling better.  Less short of breath.  Still feels as though she had significant shortness of breath NYHA class IV type symptoms on Wednesday.    Inpatient Medications    Scheduled Meds:  amiodarone  200 mg Oral q AM   apixaban  5 mg Oral  BID   furosemide  40 mg Intravenous BID   metoprolol tartrate  12.5 mg Oral BID   sodium chloride flush  3 mL Intravenous Q12H    Continuous Infusions:  sodium chloride      PRN Meds: sodium chloride, acetaminophen, ondansetron (ZOFRAN) IV, sodium chloride flush    Vital Signs          Vitals:    12/07/22 1956 12/07/22 2356 12/08/22 0448 12/08/22 0813  BP: (!) 152/47 (!) 142/46 (!) 141/50    Pulse: 65 (!) 59 61 64  Resp: 20 19 15 16  Temp: 97.9 F (36.6 C) 97.9 F (36.6 C) 98 F (36.7 C)    TempSrc: Oral Oral Oral    SpO2: 97% 96% 98% 98%  Weight:     12'$ 1.1 kg    Height:              Intake/Output Summary (Last 24 hours) at 12/08/2022 0855 Last data filed at 12/07/2022 2357    Gross per 24 hour  Intake 180 ml  Output 2100 ml  Net -1920 ml  12/08/2022    4:48 AM 12/07/2022    2:17 PM 12/06/2022    3:05 PM  Last 3 Weights  Weight (lbs) 267 lb 271 lb 13.2 oz 270 lb 1 oz  Weight (kg) 121.11 kg 123.3 kg 122.5 kg       Physical Exam    GEN: No acute distress.   Neck: No JVD Cardiac: RRR, 2/6 systolic murmur, no rubs, or gallops.  Respiratory: Clear to auscultation bilaterally. GI: Soft, nontender, non-distended  MS: No edema; No deformity. Neuro:  Nonfocal  Psych: Normal affect    Labs    High Sensitivity Troponin:   Last Labs      Recent Labs  Lab 12/06/22 1541 12/06/22 1742  TROPONINIHS 75* 55*       Chemistry Last Labs      Recent Labs  Lab 12/06/22 1541 12/07/22 0447  NA 139 139  K 4.3 4.0  CL 107 106  CO2 23 22  GLUCOSE 166* 103*  BUN 11 14  CREATININE 1.04* 1.25*  CALCIUM 9.0 8.6*  MG  --  2.1  GFRNONAA 55* 44*  ANIONGAP 9 11      Lipids  Last Labs  No results for input(s): "CHOL", "TRIG", "HDL", "LABVLDL", "LDLCALC", "CHOLHDL" in the last 168 hours.    Hematology Last Labs      Recent Labs  Lab 12/06/22 1541 12/07/22 0447  WBC 11.4* 8.5  RBC 3.77* 3.59*  HGB 11.8* 11.2*  HCT 36.2 34.8*  MCV 96.0 96.9  MCH  31.3 31.2  MCHC 32.6 32.2  RDW 15.7* 15.9*  PLT 189 177      Thyroid  Last Labs  No results for input(s): "TSH", "FREET4" in the last 168 hours.    BNP Last Labs     Recent Labs  Lab 12/06/22 1541  BNP 451.5*      DDimer  Last Labs  No results for input(s): "DDIMER" in the last 168 hours.      Radiology     Imaging Results (Last 48 hours)  ECHOCARDIOGRAM COMPLETE   Result Date: 12/07/2022    ECHOCARDIOGRAM REPORT   Patient Name:   LALANYA RUFENER Date of Exam: 12/07/2022 Medical Rec #:  [id number]   Height:       66.0 in Accession #:    [id number]  Weight:       270.1 lb Date of Birth:  1941/06/10   BSA:          2.272 m Patient Age:    33 years    BP:           145/67 mmHg Patient Gender: F           HR:           76 bpm. Exam Location:  Inpatient Procedure: 2D Echo, Cardiac Doppler and Color Doppler Indications:    s/p tavr  History:        Patient has prior history of Echocardiogram examinations, most                 recent 06/03/2020. Risk Factors:Hypertension, Diabetes and                 Dyslipidemia.  Sonographer:    Melissa Morford RDCS (AE, PE) Referring Phys: [id number] [place]  1. Left ventricular ejection fraction, by estimation, is 55 to 60%. The left ventricle has normal function. The left ventricle has no regional wall motion abnormalities but there appears to be  septal-lateral dyssynchrony consistent with LBBB. There is mild left ventricular hypertrophy. Left ventricular diastolic parameters are consistent with Grade II diastolic dysfunction (pseudonormalization).  2. Peak RV-RA gradient 17 mmHg. IVC not visualized. Right ventricular systolic function is normal. The right ventricular size is normal.  3. Left atrial size was moderately dilated.  4. Right atrial size was mildly dilated.  5. The mitral valve is degenerative. Mild mitral valve regurgitation. Moderate mitral stenosis. The mean mitral valve gradient is 7.0 mmHg, MVA 1.24 cm^2 (VTI). Moderate  mitral annular calcification.  6. 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 Mild peri-valvular regurgitation. Mean gradient 11 mmHg. EOA 1.47 cm^2. FINDINGS  Left Ventricle: Left ventricular ejection fraction, by estimation, is 55 to 60%. The left ventricle has normal function. The left ventricle has no regional wall motion abnormalities. Definity contrast agent was given IV to delineate the left ventricular  endocardial borders. The left ventricular internal cavity size was normal in size. There is mild left ventricular hypertrophy. Left ventricular diastolic parameters are consistent with Grade II diastolic dysfunction (pseudonormalization). Right Ventricle: Peak RV-RA gradient 17 mmHg. IVC not visualized. The right ventricular size is normal. No increase in right ventricular wall thickness. Right ventricular systolic function is normal. Left Atrium: Left atrial size was moderately dilated. Right Atrium: Right atrial size was mildly dilated. Pericardium: There is no evidence of pericardial effusion. Mitral Valve: The mitral valve is degenerative in appearance. There is moderate thickening of the mitral valve leaflet(s). There is moderate calcification of the mitral valve leaflet(s). Moderate mitral annular calcification. Mild mitral valve regurgitation. Moderate mitral valve stenosis. The mean mitral valve gradient is 7.0 mmHg. Tricuspid Valve: The tricuspid valve is normal in structure. Tricuspid valve regurgitation is trivial. 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 Mild peri-valvular regurgitation. Mean gradient 11 mmHg. EOA 1.47 cm^2. The aortic valve has been repaired/replaced. Aortic valve regurgitation is mild. Aortic regurgitation PHT measures 608 msec. Aortic valve mean gradient measures 11.0 mmHg. Aortic valve peak gradient measures 20.8 mmHg. Aortic valve area, by VTI measures 1.47 cm. There is a 29 mm stented (TAVR) valve present in  the aortic position. Pulmonic Valve: The pulmonic valve was normal in structure. Pulmonic valve regurgitation is trivial. Aorta: The aortic root is normal in size and structure. Venous: The inferior vena cava was not well visualized. IAS/Shunts: No atrial level shunt detected by color flow Doppler.  LEFT VENTRICLE PLAX 2D LVIDd:         4.75 cm   Diastology LVIDs:         3.90 cm   LV e' medial:    8.05 cm/s LV PW:         1.00 cm   LV E/e' medial:  22.1 LV IVS:        1.15 cm   LV e' lateral:   4.24 cm/s LVOT diam:     2.24 cm   LV E/e' lateral: 42.0 LV SV:         72 LV SV Index:   32 LVOT Area:     3.94 cm  LEFT ATRIUM              Index        RIGHT ATRIUM           Index LA Vol (A2C):   107.0 ml 47.09 ml/m  RA Area:     19.80 cm LA Vol (A4C):   90.8 ml  39.96 ml/m  RA Volume:   46.50 ml  20.47 ml/m LA Biplane Vol: 99.4 ml  43.75 ml/m  AORTIC VALVE AV Area (Vmax):    1.72 cm AV Area (Vmean):   1.60 cm AV Area (VTI):     1.47 cm AV Vmax:           228.00 cm/s AV Vmean:          158.000 cm/s AV VTI:            0.490 m AV Peak Grad:      20.8 mmHg AV Mean Grad:      11.0 mmHg LVOT Vmax:         99.80 cm/s LVOT Vmean:        64.100 cm/s LVOT VTI:          0.183 m LVOT/AV VTI ratio: 0.37 AI PHT:            608 msec MITRAL VALVE                TRICUSPID VALVE MV Area (PHT): 3.61 cm     TR Peak grad:   17.6 mmHg MV Area VTI:   1.24 cm     TR Vmax:        210.00 cm/s MV Mean grad:  7.0 mmHg MV VTI:        0.58 m       SHUNTS MV Decel Time: 210 msec     Systemic VTI:  0.18 m MV E velocity: 178.00 cm/s  Systemic Diam: 2.24 cm MV A velocity: 129.00 cm/s MV E/A ratio:  1.38 Dalton McleanMD Electronically signed by Franki Monte Signature Date/Time: 12/07/2022/2:00:45 PM    Final     DG Chest 2 View   Result Date: 12/06/2022 CLINICAL DATA:  chest pain EXAM: CHEST two views COMPARISON:  11/24/2022 FINDINGS: Cardiac silhouette is prominent. There is pulmonary interstitial prominence with vascular congestion.  No focal consolidation. No pneumothorax identified. There are small pleural effusions. There are thoracic degenerative changes. There is a prosthetic aortic valve. Implanted electronic device overlies left hemithorax. IMPRESSION: Findings suggest CHF. Electronically Signed   By: Sammie Bench M.D.   On: 12/06/2022 16:15       Cardiac Studies    ECHO: 12/07/22   1. Left ventricular ejection fraction, by estimation, is 55 to 60%. The  left ventricle has normal function. The left ventricle has no regional  wall motion abnormalities but there appears to be septal-lateral  dyssynchrony consistent with LBBB. There is  mild left ventricular hypertrophy. Left ventricular diastolic parameters  are consistent with Grade II diastolic dysfunction (pseudonormalization).   2. Peak RV-RA gradient 17 mmHg. IVC not visualized. Right ventricular  systolic function is normal. The right ventricular size is normal.   3. Left atrial size was moderately dilated.   4. Right atrial size was mildly dilated.   5. The mitral valve is degenerative. Mild mitral valve regurgitation.  Moderate mitral stenosis. The mean mitral valve gradient is 7.0 mmHg, MVA  1.24 cm^2 (VTI). Moderate mitral annular calcification.   6. 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  Mild peri-valvular regurgitation. Mean gradient 11 mmHg. EOA 1.47 cm^2.  Stable echocardiogram   Patient Profile     81 y.o. female post TAVR 11/28/2022 here with shortness of breath slightly improved but worse on Wednesday   Assessment & Plan    Dyspnea/diastolic heart failure - Above echocardiogram.  Perivalvular leak but this  still appears mild.  Aortic valve gradient is minimal 2.2 meters per second.  EF appears normal. -Lasix IV 40 mg twice a day administered yesterday. Creatinine slightly increased today from 1.04-1.25.  Approximately 2 L out. -At home she had torsemide 20 mg once a day.  We will change that to twice a day  for the time being. -She does admit that she was significantly short of breath prior to the TAVR procedure.  Perhaps this is some carryover however she did feel better immediately postprocedure.  Atrial fibrillation is under good control. -Structural team aware. Initial BNP 451.  Troponin 55 on repeat from 75.  Not ACS.  This is myocardial injury in the setting of recent diastolic heart failure.   Paroxysmal atrial fibrillation - Currently on amiodarone 200 mg a day.  She is maintaining sinus rhythm.  Continue with Eliquis 5 mg twice a day for anticoagulation.  She is also on low-dose metoprolol 12.5 twice daily.     -She should have close follow-up with the structural team soon post TAVR.  I am comfortable with her discharge home.  Family members at bedside.  She is eager to go home so that tomorrow she can make cookies with her grand children. Her daughter in room has a defibrillator.  When she was in her mid 6s she had sudden cardiac arrest which was from hypokalemia.   For questions or updates, please contact [place] Please consult [email or web address] for contact info under         Signed, Candee Furbish, MD

## 2022-12-25 ENCOUNTER — Other Ambulatory Visit: Payer: Self-pay | Admitting: Interventional Radiology

## 2022-12-25 DIAGNOSIS — C642 Malignant neoplasm of left kidney, except renal pelvis: Secondary | ICD-10-CM

## 2022-12-26 NOTE — Addendum Note (Signed)
Encounter addended by: Gerarda Gunther on: 12/26/2022 5:33 PM  Actions taken: Imaging Exam ended

## 2023-01-04 NOTE — Progress Notes (Signed)
[facility] [place]                                     Cardiology Office Note:    Date:  01/09/2023   ID:  [name] [name], DOB 1941-09-30, MRN [id number]  PCP:  Glenis Smoker, MD  [facility] Cardiologist: Dr. Margaretann Loveless, MD/ Dr. Angelena Form, MD & Dr. Tenny Craw, MD (TAVR) [facility] Electrophysiologist:  Vickie Epley, MD   Referring MD: Glenis Smoker, *   Chief Complaint  Patient presents with   Follow-up    1 month s/p TAVR    History of Present Illness:    [name] [name] is a 82 y.o. female with a hx of morbid obesity, arthritis, HTN, DM2 diet controlled, paroxysmal A-fib on eliquis, mild CAD, GERD, CKD III, chronic LE wounds, and severe low flow, low gradient AS who presented to [facility] on 11/28/22 for planned TAVR and is being seen today for 1 month post TAVR follow up.    The patient presented to the ED 05/2020 for chest pain and was found to have moderate aortic stenosis on echo. She been followed for her AS with regular echos since then. Echo on 03/29/22 showed progression to severe AS with mean gradient of 34 mmHg and AVA 0.7. However, she was asymptomatic, so surveillance was recommended. At her cardiology appt on 09/27/22 she had complaints of progressive dyspnea on exertion and fatigue. Echo showed severe calcified AS with a mean gradient of 29.0 mmHg, a peak gradient of 47.3 mmHg, and valve area by VTI 0.92.    She was then evaluated by the multidisciplinary valve team and felt to be a suitable candidate for TAVR, which was set up for 11/28/22.    [name] [name] is now s/p successful TAVR with a 29 mm Medtronic Evolut Fx THV via the TF approach on 11/28/22. Post operative echo with mild peri-valvular regurgitation with a mean gradient 11 mmHg and AVA at 1.47cm2. She was discharged 11/29/22 however was re-admitted 12/06/22-12/08/22 with acute CHF and treated with IV diuresis with improved symptoms. Weight 270>>267lb  however Cr  trended from 1.04 to 1.25. She was transitioned to PO torsemide '20mg'$  BID at discharge. GDMT with Toprol XL '25mg'$  daily, held off on adding ARNI/MRA/SGLT2I given Cr rise.   She had a new LBBB post TAVR therefore ZIO was placed at discharge which showed SR, 1st degree AV block, LBBB with IVCD, and atrial fibrillation with an AF burden at 34%.   Today she is here alone and reports that she has been well since discharge. She denies chest pain, palpitations, SOB, LE edema, orthopnea, dizziness, or syncope. Denies bleeding in stool or urine. She feels better with ambulating since her TAVR and is able to walk further without resting. Her biggest complaint at the moment is surrounding pain in her knees. She is thinking that she needs knee replacement at some point.   Past Medical History:  Diagnosis Date   Anemia    hx of   Arthritis    knee and shoulders   Atrial fibrillation (HCC)    Carpal tunnel syndrome on right    Coronary artery disease 05/2020   Minimal, nobstructive CAD. Tortuous cororany arteries.   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 Angelena Form & Dr. Tenny Craw.    Past Surgical History:  Procedure Laterality Date   ABDOMINAL HYSTERECTOMY  1992   complete   BILATERAL CARPAL TUNNEL RELEASE  yrs ago   CARDIOVERSION N/A 09/23/2020   Procedure: CARDIOVERSION;  Surgeon: Freada Bergeron, MD;  Location: [place];  Service: Cardiovascular;   Laterality: N/A;   CARDIOVERSION N/A 03/21/2021   Procedure: CARDIOVERSION;  Surgeon: Werner Lean, MD;  Location: [place];  Service: Cardiovascular;  Laterality: N/A;   CHOLECYSTECTOMY  1996   CYSTOSCOPY/URETEROSCOPY/HOLMIUM LASER/STENT PLACEMENT Bilateral 08/19/2020   Procedure: CYSTOSCOPY BILATERAL  RETROGRADE  URETEROSCOPY/HOLMIUM LASER/STENT PLACEMENT;  Surgeon: Irine Seal, MD;  Location[facility];  Service: Urology;  Laterality: Bilateral;   HAMMER TOE SURGERY Right yrs ago   INTRAOPERATIVE TRANSTHORACIC ECHOCARDIOGRAM N/A 11/28/2022   Procedure: INTRAOPERATIVE TRANSTHORACIC ECHOCARDIOGRAM;  Surgeon: Burnell Blanks, MD;  Location: [place];  Service: Open Heart Surgery;  Laterality: N/A;   IR RADIOLOGIST EVAL & MGMT  10/26/2020   IR RADIOLOGIST EVAL & MGMT  01/06/2021   IR RADIOLOGIST EVAL & MGMT  03/22/2021   IR RADIOLOGIST EVAL & MGMT  07/06/2021   IR RADIOLOGIST EVAL & MGMT  01/11/2022   LEFT HEART CATH AND CORONARY ANGIOGRAPHY N/A 06/02/2020   Procedure: LEFT HEART CATH AND CORONARY ANGIOGRAPHY;  Surgeon: Jettie Booze, MD;  Location: [place] [facility];  Service: Cardiovascular;  Laterality: N/A;   RADIOLOGY WITH ANESTHESIA N/A 11/17/2020   Procedure: RADIOLOGY WITH ANESTHESIA RENAL MICROWAVE ABLATION;  Surgeon: Suzette Battiest, MD;  Location: [facility];  Service: Radiology;  Laterality: N/A;   TONSILLECTOMY  age 89   TRANSCATHETER AORTIC VALVE REPLACEMENT, TRANSFEMORAL N/A 11/28/2022   Procedure: Transcatheter Aortic Valve Replacement, Transfemoral using a Medtronic 29 mm EVOLUT FX Aortic Valve;  Surgeon: Burnell Blanks, MD;  Location: [place] [facility];  Service: Open Heart Surgery;  Laterality: N/A;    Current Medications: Current Meds  Medication Sig   acetaminophen (TYLENOL) 500 MG tablet Take 1,000 mg by mouth daily as needed for moderate pain or headache.   amiodarone (PACERONE) 200 MG tablet Take 1 tablet (200 mg total) by mouth daily.  (Patient taking differently: Take 200 mg by mouth in the morning.)   amoxicillin (AMOXIL) 500 MG capsule Take 4 capsules (2,000 mg total) by mouth as directed. Take 4 tablets 1 hour prior to dental work, including cleanings.   Calcium Carb-Cholecalciferol (CALCIUM 600+D) 600-800 MG-UNIT TABS Take 1 tablet by mouth every evening.    ELIQUIS 5 MG TABS tablet TAKE 1 TABLET BY MOUTH TWICE A DAY (Patient taking differently: Take 5 mg by mouth 2 (two) times daily.)   Hydrocortisone-Aloe Vera (XTKWIOXBD-53/GDJM EX) Apply 1 application topically 2 (two) times daily as needed (itching).   Liniments (BLUE-EMU SUPER STRENGTH) CREA Apply 1 application  topically daily as needed (arthritis).   Menthol (HALLS COUGH DROPS MT) Use as directed 1-2 each in the  mouth or throat every Sunday.   metoprolol succinate (TOPROL XL) 25 MG 24 hr tablet Take 1 tablet (25 mg total) by mouth daily.   Multiple Vitamins-Minerals (ONE A DAY WOMEN 50 PLUS PO) Take 1 tablet by mouth every evening.   Multiple Vitamins-Minerals (PRESERVISION AREDS 2 PO) Take 1 capsule by mouth 2 (two) times daily.    Phenol (CHLORASEPTIC MT) Use as directed 1 spray in the mouth or throat at bedtime.   torsemide (DEMADEX) 20 MG tablet Take 1 tablet (20 mg total) by mouth 2 (two) times daily.   [DISCONTINUED] Potassium Chloride ER 20 MEQ TBCR Take 2 tablets by mouth daily. (Patient taking differently: Take 40 mEq by mouth in the morning.)     Allergies:   Tetanus-diphtheria toxoids td and Cardizem [diltiazem]   Social History   Socioeconomic History   Marital status: Married    Spouse name: Not on file   Number of children: 5   Years of education: Not on file   Highest education level: Not on file  Occupational History   Occupation: Retired-pastor  Tobacco Use   Smoking status: Never   Smokeless tobacco: Never  Vaping Use   Vaping Use: Never used  Substance and Sexual Activity   Alcohol use: Not Currently   Drug use: Never   Sexual  activity: Not Currently    Birth control/protection: Surgical  Other Topics Concern   Not on file  Social History Narrative   Not on file   Social Determinants of Health   Financial Resource Strain: Not on file  Food Insecurity: No Food Insecurity (12/07/2022)   Hunger Vital Sign    Worried About Running Out of Food in the Last Year: Never true    Ran Out of Food in the Last Year: Never true  Transportation Needs: No Transportation Needs (12/07/2022)   PRAPARE - Hydrologist (Medical): No    Lack of Transportation (Non-Medical): No  Physical Activity: Not on file  Stress: Not on file  Social Connections: Not on file     Family History: [name] family history includes Diabetes in her paternal grandmother; Hypertension in her father and mother.  ROS:   Please see the history of present illness.    All other systems reviewed and are negative.  EKGs/Labs/Other Studies Reviewed:    The following studies were reviewed today:  Echocardiogram 01/08/23: (1 month s/p TAVR)   1. Moderate paravalvular leak in the 1-3 o'clock position. Best seen in  PLAX. Vmax 2.4 m/s, MG 12 mmHG, EOA 1.64 cm2, DI 0.38. The aortic valve  has been repaired/replaced. Aortic valve regurgitation is moderate. There  is a 29 mm Sapien prosthetic (TAVR)   valve present in the aortic position. Procedure Date: 11/28/2022.   2. Left ventricular ejection fraction, by estimation, is 55 to 60%. The  left ventricle has normal function. The left ventricle has no regional  wall motion abnormalities. There is moderate concentric left ventricular  hypertrophy. Left ventricular  diastolic parameters are consistent with Grade II diastolic dysfunction  (pseudonormalization).   3. Right ventricular systolic function is normal. The right ventricular  size is normal. Tricuspid regurgitation signal is inadequate for assessing  PA pressure.   4. Left atrial size was moderately dilated.    5. The mitral valve is degenerative. Trivial mitral valve regurgitation.  No evidence of mitral stenosis.   6. The inferior vena cava is normal in size with greater than 50%  respiratory variability,  suggesting right atrial pressure of 3 mmHg.   Comparison(s): Changes from prior study are noted. Perivalvular leak  appears moderate.   TAVR OPERATIVE NOTE     Date of Procedure:                11/28/2022   Preoperative Diagnosis:      Severe Aortic Stenosis    Postoperative Diagnosis:    Same    Procedure:        Transcatheter Aortic Valve Replacement - Transfemoral Approach             Medtronic Evolut Pro THV (size 29 mm, model # [id number], serial # [id number])              Co-Surgeons:                        Lauree Chandler, MD and Justice Rocher, MD    Anesthesiologist:                  Ermalene Postin   Echocardiographer:              Gasper Sells   Pre-operative Echo Findings: Severe aortic stenosis Normal left ventricular systolic function   Post-operative Echo Findings: Mild paravalvular leak Normal left ventricular systolic function _____________  Echo 11/29/22:   1. Left ventricular ejection fraction, by estimation, is 55 to 60%. The  left ventricle has normal function. The left ventricle has no regional  wall motion abnormalities but there appears to be septal-lateral  dyssynchrony consistent with LBBB. There is  mild left ventricular hypertrophy. Left ventricular diastolic parameters  are consistent with Grade II diastolic dysfunction (pseudonormalization).   2. Peak RV-RA gradient 17 mmHg. IVC not visualized. Right ventricular  systolic function is normal. The right ventricular size is normal.   3. Left atrial size was moderately dilated.   4. Right atrial size was mildly dilated.   5. The mitral valve is degenerative. Mild mitral valve regurgitation.  Moderate mitral stenosis. The mean mitral valve gradient is 7.0 mmHg, MVA  1.24 cm^2 (VTI). Moderate mitral  annular calcification.   6. 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  Mild peri-valvular regurgitation. Mean gradient 11 mmHg. EOA 1.47 cm^2.   EKG:  EKG is not ordered today.    Recent Labs: 04/25/2022: TSH 2.220 11/24/2022: ALT 23 12/06/2022: B Natriuretic Peptide 451.5 12/07/2022: BUN 14; Creatinine, Ser 1.25; Hemoglobin 11.2; Magnesium 2.1; Platelets 177; Potassium 4.0; Sodium 139   Recent Lipid Panel    Component Value Date/Time   CHOL 184 06/01/2020 1704   TRIG 67 06/01/2020 1704   HDL 51 06/01/2020 1704   CHOLHDL 3.6 06/01/2020 1704   VLDL 13 06/01/2020 1704   LDLCALC 120 (H) 06/01/2020 1704   Physical Exam:    VS:  BP (!) 146/70   Pulse (!) 59   Ht '5\' 6"'$  (1.676 m)   Wt 271 lb 9.6 oz (123.2 kg) Comment: Per patient  SpO2 96%   BMI 43.84 kg/m     Wt Readings from Last 3 Encounters:  01/08/23 271 lb 9.6 oz (123.2 kg)  12/08/22 267 lb (121.1 kg)  11/29/22 270 lb 3.2 oz (122.6 kg)    General: Well developed, well nourished, NAD Skin: Warm, dry, intact  Lungs:Clear to ausculation bilaterally. No wheezes, rales, or rhonchi. Breathing is unlabored. Cardiovascular: RRR with S1 S2. No murmurs Extremities: No edema.  Neuro: Alert and oriented. No focal deficits. No facial asymmetry.  MAE spontaneously. Psych: Responds to questions appropriately with normal affect.    ASSESSMENT/PLAN:    Severe AS: Patient doing well with NYHA class I symptoms s/p successful TAVR with a 29 mm Medtronic Evolut Fx THV via the TF approach on 11/28/22. Post operative echo with mild PVL with a mean gradient at 75mHg and AVA at 1.47cm2. Echo today with increased PVL to moderate with stable MG. Given lack of symptoms, likely will continue to monitor closely with repeat imaging and symptom assessment. Continue Eliquis '5mg'$  BID. Will need lifelong dental SBE with Amoxicillin, reviewed. Plan follow up with primary cardiologist and our team at 1 year (11/2023).   HFpEF:  Re-admitted post TAVR with acute on chronic HF and treated with IV Lasix. She was discharged on PO torsemide and appears euvolemic today on exam with no c/o of SOB or edema. Weight stable.    New LBBB: ZIO reviewed toady that shows no high grade heart block with evidence of SR, 1st degree AV block, LBBB/IVCD, and AF with 34% burden.    HTN: Stable, with no changes needed today.    Persistent afib: No change, continue Eliquis and amiodarone.    CKD stage IIIb: Creat remains in the ~1.2 range   Pulmonary nodules: pre TAVR CT showed small bilateral solid pulmonary nodules, largest measuring 3 mm. No follow-up needed if patient is low-risk (and has no known or suspected primary neoplasm). Patient considered low risk with no malignant history or history of tobacco use.   Medication Adjustments/Labs and Tests Ordered: Current medicines are reviewed at length with the patient today.  Concerns regarding medicines are outlined above.  No orders of the defined types were placed in this encounter.  Meds ordered this encounter  Medications   amoxicillin (AMOXIL) 500 MG capsule    Sig: Take 4 capsules (2,000 mg total) by mouth as directed. Take 4 tablets 1 hour prior to dental work, including cleanings.    Dispense:  12 capsule    Refill:  12    Order Specific Question:   Supervising Provider    Answer:   CSherren Mocha[[[id number]   Patient Instructions  Medication Instructions:  Your physician recommends that you continue on your current medications as directed. Please refer to the Current Medication list given to you today.  *If you need a refill on your cardiac medications before your next appointment, please call your pharmacy*   Lab Work: NONE If you have labs (blood work) drawn today and your tests are completely normal, you will receive your results only by: [place](if you have MyChart) OR A paper copy in the mail If you have any lab test that is abnormal or we need to change  your treatment, we will call you to review the results.   Testing/Procedures: NONE   Follow-Up: At [facility] you and your health needs are our priority.  As part of our continuing mission to provide you with exceptional heart care, we have created designated Provider Care Teams.  These Care Teams include your primary Cardiologist (physician) and Advanced Practice Providers (APPs -  Physician Assistants and Nurse Practitioners) who all work together to provide you with the care you need, when you need it.  We recommend signing up for the patient portal called "MyChart".  Sign up information is provided on this After Visit Summary.  MyChart is used to connect with patients for Virtual Visits (Telemedicine).  Patients are able to view lab/test results, encounter notes, upcoming appointments,  etc.  Non-urgent messages can be sent to your provider as well.   To learn more about what you can do with MyChart, go to [email or web address].    Your next appointment:   KEEP SCHEDULED FOLLOW-UP   Signed, Kathyrn Drown, NP  01/09/2023 8:24 AM    [place]

## 2023-01-08 ENCOUNTER — Other Ambulatory Visit: Payer: Self-pay | Admitting: Cardiology

## 2023-01-08 ENCOUNTER — Ambulatory Visit: Payer: Medicare Other | Attending: Cardiovascular Disease

## 2023-01-08 ENCOUNTER — Telehealth: Payer: Self-pay | Admitting: Cardiology

## 2023-01-08 ENCOUNTER — Ambulatory Visit (INDEPENDENT_AMBULATORY_CARE_PROVIDER_SITE_OTHER): Payer: Medicare Other | Admitting: Cardiology

## 2023-01-08 ENCOUNTER — Other Ambulatory Visit: Payer: Self-pay

## 2023-01-08 VITALS — BP 146/70 | HR 59 | Ht 66.0 in | Wt 271.6 lb

## 2023-01-08 DIAGNOSIS — R911 Solitary pulmonary nodule: Secondary | ICD-10-CM

## 2023-01-08 DIAGNOSIS — I447 Left bundle-branch block, unspecified: Secondary | ICD-10-CM | POA: Diagnosis not present

## 2023-01-08 DIAGNOSIS — I5032 Chronic diastolic (congestive) heart failure: Secondary | ICD-10-CM

## 2023-01-08 DIAGNOSIS — I4819 Other persistent atrial fibrillation: Secondary | ICD-10-CM | POA: Diagnosis not present

## 2023-01-08 DIAGNOSIS — I10 Essential (primary) hypertension: Secondary | ICD-10-CM | POA: Insufficient documentation

## 2023-01-08 DIAGNOSIS — I350 Nonrheumatic aortic (valve) stenosis: Secondary | ICD-10-CM | POA: Insufficient documentation

## 2023-01-08 DIAGNOSIS — Z952 Presence of prosthetic heart valve: Secondary | ICD-10-CM

## 2023-01-08 LAB — ECHOCARDIOGRAM COMPLETE
AR max vel: 1.54 cm2
AV Area VTI: 1.64 cm2
AV Area mean vel: 1.5 cm2
AV Mean grad: 12.3 mmHg
AV Peak grad: 24.1 mmHg
Ao pk vel: 2.45 m/s
Area-P 1/2: 1.95 cm2
P 1/2 time: 696 msec
S' Lateral: 3.6 cm

## 2023-01-08 MED ORDER — POTASSIUM CHLORIDE ER 20 MEQ PO TBCR: 2.0000 | EXTENDED_RELEASE_TABLET | Freq: Every day | ORAL | 2 refills | Status: DC

## 2023-01-08 MED ORDER — AMOXICILLIN 500 MG PO CAPS: 2000.0000 mg | ORAL_CAPSULE | ORAL | 12 refills | Status: DC

## 2023-01-08 NOTE — Patient Instructions (Signed)
Medication Instructions:  Your physician recommends that you continue on your current medications as directed. Please refer to the Current Medication list given to you today.  *If you need a refill on your cardiac medications before your next appointment, please call your pharmacy*   Lab Work: NONE If you have labs (blood work) drawn today and your tests are completely normal, you will receive your results only by: [place] (if you have MyChart) OR A paper copy in the mail If you have any lab test that is abnormal or we need to change your treatment, we will call you to review the results.   Testing/Procedures: NONE   Follow-Up: At [facility], you and your health needs are our priority.  As part of our continuing mission to provide you with exceptional heart care, we have created designated Provider Care Teams.  These Care Teams include your primary Cardiologist (physician) and Advanced Practice Providers (APPs -  Physician Assistants and Nurse Practitioners) who all work together to provide you with the care you need, when you need it.  We recommend signing up for the patient portal called "MyChart".  Sign up information is provided on this After Visit Summary.  MyChart is used to connect with patients for Virtual Visits (Telemedicine).  Patients are able to view lab/test results, encounter notes, upcoming appointments, etc.  Non-urgent messages can be sent to your provider as well.   To learn more about what you can do with MyChart, go to [email or web address].    Your next appointment:   KEEP SCHEDULED FOLLOW-UP

## 2023-01-10 ENCOUNTER — Ambulatory Visit (HOSPITAL_COMMUNITY)
Admission: RE | Admit: 2023-01-10 | Discharge: 2023-01-10 | Disposition: A | Discharge status: Home / Self Care | Payer: Medicare Other | Source: Ambulatory Visit | Attending: Interventional Radiology | Admitting: Interventional Radiology

## 2023-01-10 DIAGNOSIS — I700 Atherosclerosis of aorta: Secondary | ICD-10-CM | POA: Diagnosis not present

## 2023-01-10 DIAGNOSIS — C642 Malignant neoplasm of left kidney, except renal pelvis: Secondary | ICD-10-CM | POA: Insufficient documentation

## 2023-01-10 DIAGNOSIS — N200 Calculus of kidney: Secondary | ICD-10-CM | POA: Diagnosis not present

## 2023-01-10 DIAGNOSIS — I722 Aneurysm of renal artery: Secondary | ICD-10-CM | POA: Diagnosis not present

## 2023-01-10 DIAGNOSIS — Z9889 Other specified postprocedural states: Secondary | ICD-10-CM | POA: Diagnosis not present

## 2023-01-10 DIAGNOSIS — E119 Type 2 diabetes mellitus without complications: Secondary | ICD-10-CM | POA: Diagnosis not present

## 2023-01-10 LAB — POCT I-STAT CREATININE: Creatinine, Ser: 1.3 mg/dL — ABNORMAL HIGH (ref 0.44–1.00)

## 2023-01-10 MED ORDER — SODIUM CHLORIDE (PF) 0.9 % IJ SOLN
INTRAMUSCULAR | Status: AC
  Filled 2023-01-10: qty 50

## 2023-01-10 MED ORDER — IOHEXOL 300 MG/ML  SOLN
75.0000 mL | Freq: Once | INTRAMUSCULAR | Status: AC | PRN
  Administered 2023-01-10: 75 mL via INTRAVENOUS

## 2023-01-15 ENCOUNTER — Ambulatory Visit
Admission: RE | Admit: 2023-01-15 | Discharge: 2023-01-15 | Disposition: A | Discharge status: Home / Self Care | Payer: Medicare Other | Source: Ambulatory Visit | Attending: Interventional Radiology | Admitting: Interventional Radiology

## 2023-01-15 DIAGNOSIS — N2889 Other specified disorders of kidney and ureter: Secondary | ICD-10-CM | POA: Diagnosis not present

## 2023-01-15 DIAGNOSIS — C642 Malignant neoplasm of left kidney, except renal pelvis: Secondary | ICD-10-CM

## 2023-01-15 DIAGNOSIS — Z9889 Other specified postprocedural states: Secondary | ICD-10-CM | POA: Diagnosis not present

## 2023-01-15 HISTORY — PX: IR RADIOLOGIST EVAL & MGMT: IMG5224

## 2023-01-15 NOTE — Progress Notes (Signed)
Referring Physician(s): Jacalyn Lefevre, MD   Chief Complaint: The patient is seen in virtual telephone follow up today s/p renal mass ablation   History of present illness:   [name] [name] is a 82 y.o. female presenting as a scheduled follow up to [facility] today, status post treatment of left renal tumor with biopsy.   She joins [place] today virtually, and we confirmed her identity with 2 personal identifiers.   She was treated on 11/17/2020, with image guided biopsy and microwave ablation of left renal tumor.  This path was:     She presents today for 2 year follow up. She continues to recover well.  No back or flank pain, no hematuria, no dysuria, no fevers/chills.  She underwent TAVR last December and has recovered very well, feeling much better.  She is hopeful to next be able to address her knee pain now that her cardiac issues are improved.   Past Medical History:  Diagnosis Date   Anemia    hx of   Arthritis    knee and shoulders   Atrial fibrillation (HCC)    Carpal tunnel syndrome on right    Coronary artery disease 05/2020   Minimal, nobstructive CAD. Tortuous cororany arteries.   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 Angelena Form & Dr. Tenny Craw.    Past Surgical History:  Procedure Laterality Date   ABDOMINAL HYSTERECTOMY  1992   complete   BILATERAL CARPAL TUNNEL RELEASE  yrs ago   CARDIOVERSION N/A 09/23/2020   Procedure:  CARDIOVERSION;  Surgeon: Freada Bergeron, MD;  Location: [place];  Service: Cardiovascular;  Laterality: N/A;   CARDIOVERSION N/A 03/21/2021   Procedure: CARDIOVERSION;  Surgeon: Werner Lean, MD;  Location: [place];  Service: Cardiovascular;  Laterality: N/A;   CHOLECYSTECTOMY  1996   CYSTOSCOPY/URETEROSCOPY/HOLMIUM LASER/STENT PLACEMENT Bilateral 08/19/2020   Procedure: CYSTOSCOPY BILATERAL  RETROGRADE  URETEROSCOPY/HOLMIUM LASER/STENT PLACEMENT;  Surgeon: Irine Seal, MD;  Location: [facility];  Service: Urology;  Laterality: Bilateral;   HAMMER TOE SURGERY Right yrs ago   INTRAOPERATIVE TRANSTHORACIC ECHOCARDIOGRAM N/A 11/28/2022   Procedure: INTRAOPERATIVE TRANSTHORACIC ECHOCARDIOGRAM;  Surgeon: Burnell Blanks, MD;  Location: [place];  Service: Open Heart Surgery;  Laterality: N/A;   IR RADIOLOGIST EVAL & MGMT  10/26/2020   IR RADIOLOGIST EVAL & MGMT  01/06/2021   IR RADIOLOGIST EVAL & MGMT  03/22/2021   IR RADIOLOGIST EVAL & MGMT  07/06/2021   IR RADIOLOGIST EVAL & MGMT  01/11/2022   LEFT HEART CATH AND CORONARY ANGIOGRAPHY N/A 06/02/2020   Procedure: LEFT HEART CATH AND CORONARY ANGIOGRAPHY;  Surgeon: Jettie Booze, MD;  Location: [place] [facility];  Service: Cardiovascular;  Laterality: N/A;   RADIOLOGY WITH ANESTHESIA N/A 11/17/2020   Procedure: RADIOLOGY WITH ANESTHESIA RENAL MICROWAVE ABLATION;  Surgeon:  Dionicio Shelnutt, Rosanne Ashing, MD;  Location: [facility];  Service: Radiology;  Laterality: N/A;   TONSILLECTOMY  age 76   TRANSCATHETER AORTIC VALVE REPLACEMENT, TRANSFEMORAL N/A 11/28/2022   Procedure: Transcatheter Aortic Valve Replacement, Transfemoral using a Medtronic 29 mm EVOLUT FX Aortic Valve;  Surgeon: Burnell Blanks, MD;  Location: [place] [facility];  Service: Open Heart Surgery;  Laterality: N/A;    Allergies: Tetanus-diphtheria toxoids td and Cardizem [diltiazem]  Medications: Prior to Admission medications   Medication Sig Start Date End  Date Taking? Authorizing Provider  acetaminophen (TYLENOL) 500 MG tablet Take 1,000 mg by mouth daily as needed for moderate pain or headache.    [provider]  amiodarone (PACERONE) 200 MG tablet Take 1 tablet (200 mg total) by mouth daily. Patient taking differently: Take 200 mg by mouth in the morning. 07/21/22   Shirley Friar, PA-C  amoxicillin (AMOXIL) 500 MG capsule Take 4 capsules (2,000 mg total) by mouth as directed. Take 4 tablets 1 hour prior to dental work, including cleanings. 01/08/23   Kathyrn Drown D, NP  Calcium Carb-Cholecalciferol (CALCIUM 600+D) 600-800 MG-UNIT TABS Take 1 tablet by mouth every evening.     [provider]  ELIQUIS 5 MG TABS tablet TAKE 1 TABLET BY MOUTH TWICE A DAY Patient taking differently: Take 5 mg by mouth 2 (two) times daily. 07/21/22   Vickie Epley, MD  Hydrocortisone-Aloe Vera (HQPRFFMBW-46/KZLD EX) Apply 1 application topically 2 (two) times daily as needed (itching).    [provider]  Liniments (BLUE-EMU SUPER STRENGTH) CREA Apply 1 application  topically daily as needed (arthritis).    [provider]  Menthol (HALLS COUGH DROPS MT) Use as directed 1-2 each in the mouth or throat every Sunday.    [provider]  metoprolol succinate (TOPROL XL) 25 MG 24 hr tablet Take 1 tablet (25 mg total) by mouth daily. 12/08/22 12/08/23  Margie Billet, NP  Multiple Vitamins-Minerals (ONE A DAY WOMEN 50 PLUS PO) Take 1 tablet by mouth every evening.    [provider]  Multiple Vitamins-Minerals (PRESERVISION AREDS 2 PO) Take 1 capsule by mouth 2 (two) times daily.     [provider]  Phenol [facility] MT) Use as directed 1 spray in the mouth or throat at bedtime.    [provider]  Potassium Chloride ER 20 MEQ TBCR Take 2 tablets (40 mEq total) by mouth daily. 01/08/23   Jerline Pain, MD  torsemide (DEMADEX) 20 MG tablet Take 1 tablet (20 mg total) by mouth 2 (two) times  daily. 12/08/22   Margie Billet, NP     Family History  Problem Relation Age of Onset   Hypertension Mother    Hypertension Father    Diabetes Paternal Grandmother     Social History   Socioeconomic History   Marital status: Married    Spouse name: Not on file   Number of children: 5   Years of education: Not on file   Highest education level: Not on file  Occupational History   Occupation: Retired-pastor  Tobacco Use   Smoking status: Never   Smokeless tobacco: Never  Vaping Use   Vaping Use: Never used  Substance and Sexual Activity   Alcohol use: Not Currently   Drug use: Never   Sexual activity: Not Currently    Birth control/protection: Surgical  Other Topics Concern   Not on file  Social History Narrative   Not on file   Social Determinants of Health  Financial Resource Strain: Not on file  Food Insecurity: No Food Insecurity (12/07/2022)   Hunger Vital Sign    Worried About Running Out of Food in the Last Year: Never true    Ran Out of Food in the Last Year: Never true  Transportation Needs: No Transportation Needs (12/07/2022)   PRAPARE - Hydrologist (Medical): No    Lack of Transportation (Non-Medical): No  Physical Activity: Not on file  Stress: Not on file  Social Connections: Not on file     Vital Signs: There were no vitals taken for this visit.  No physical examination was performed in lieu of virtual telephone clinic visit.   Imaging: CT AP 08/05/20   CT AP 01/10/23  Unchanged post ablation appearance of the posterior superior pole of the left kidney, without evidence of associated contrast enhancement.   Labs:  CBC: Recent Labs    11/24/22 0900 11/28/22 1308 11/28/22 1518 11/29/22 0124 12/06/22 1541 12/07/22 0447  WBC 9.9  --   --  9.3 11.4* 8.5  HGB 13.3   < > 11.6* 12.1 11.8* 11.2*  HCT 41.5   < > 34.0* 36.6 36.2 34.8*  PLT 225  --   --  164 189 177   < > = values in this interval not  displayed.    COAGS: Recent Labs    11/03/22 1030 11/24/22 0900 12/07/22 0447  INR 1.1 1.1 1.6*    BMP: Recent Labs    11/24/22 0900 11/28/22 1308 11/28/22 1518 11/29/22 0124 12/06/22 1541 12/07/22 0447 01/10/23 1013  NA 140   < > 144 142 139 139  --   K 3.3*   < > 3.4* 3.9 4.3 4.0  --   CL 107   < > 108 107 107 106  --   CO2 24  --   --  '24 23 22  '$ --   GLUCOSE 125*   < > 163* 153* 166* 103*  --   BUN 23   < > 24* '21 11 14  '$ --   CALCIUM 9.1  --   --  8.5* 9.0 8.6*  --   CREATININE 1.27*   < > 1.30* 1.33* 1.04* 1.25* 1.30*  GFRNONAA 43*  --   --  41* 55* 44*  --    < > = values in this interval not displayed.    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    3 month follow up CT showed excellent treatment response but complicated by small, linear urinoma within the ablated mass.  6, 12, and 24 month CTs show no evidence of recurrence or residual tumor, with resolution of the previously visualized small linear urinoma.   -Plan for IR clinic follow up in 12 months with repeat CT abdomen renal mass protocol -follow up with Urology as planned  Electronically Signed: Rosanne Ashing Kala Ambriz 01/15/2023, 8:01 AM   I spent a total of 15 Minutes in virtual telephone clinical consultation, greater than 50% of which was counseling/coordinating care for renal cell carcinoma.

## 2023-01-19 MED FILL — Magnesium Sulfate Inj 50%: INTRAMUSCULAR | Qty: 10 | Status: AC

## 2023-01-19 MED FILL — Heparin Sodium (Porcine) Inj 1000 Unit/ML: Qty: 1000 | Status: AC

## 2023-01-19 MED FILL — Potassium Chloride Inj 2 mEq/ML: INTRAVENOUS | Qty: 40 | Status: AC

## 2023-01-25 ENCOUNTER — Encounter (HOSPITAL_COMMUNITY): Payer: Self-pay | Admitting: *Deleted

## 2023-03-01 ENCOUNTER — Other Ambulatory Visit: Payer: Self-pay | Admitting: Home Health

## 2023-03-17 ENCOUNTER — Other Ambulatory Visit (HOSPITAL_COMMUNITY): Payer: Self-pay | Admitting: Cardiology

## 2023-03-17 ENCOUNTER — Other Ambulatory Visit: Payer: Self-pay | Admitting: Student

## 2023-03-17 DIAGNOSIS — I4819 Other persistent atrial fibrillation: Secondary | ICD-10-CM

## 2023-03-19 NOTE — Telephone Encounter (Signed)
Prescription refill request for Eliquis received. Indication: a fib Last office visit: 01/08/23 Scr: 1.3 01/10/23 epic Age: 82 Weight: 123kg

## 2023-04-05 ENCOUNTER — Other Ambulatory Visit: Payer: Self-pay | Admitting: Home Health

## 2023-07-13 ENCOUNTER — Ambulatory Visit: Payer: Medicare Other | Admitting: Internal Medicine

## 2023-07-17 DIAGNOSIS — E119 Type 2 diabetes mellitus without complications: Secondary | ICD-10-CM | POA: Diagnosis not present

## 2023-07-17 DIAGNOSIS — H2513 Age-related nuclear cataract, bilateral: Secondary | ICD-10-CM | POA: Diagnosis not present

## 2023-07-17 DIAGNOSIS — H353131 Nonexudative age-related macular degeneration, bilateral, early dry stage: Secondary | ICD-10-CM | POA: Diagnosis not present

## 2023-07-19 ENCOUNTER — Other Ambulatory Visit: Payer: Self-pay | Admitting: Home Health

## 2023-07-24 DIAGNOSIS — H25811 Combined forms of age-related cataract, right eye: Secondary | ICD-10-CM | POA: Diagnosis not present

## 2023-08-02 DIAGNOSIS — H25812 Combined forms of age-related cataract, left eye: Secondary | ICD-10-CM | POA: Diagnosis not present

## 2023-08-03 ENCOUNTER — Other Ambulatory Visit (HOSPITAL_COMMUNITY): Payer: Self-pay | Admitting: Cardiology

## 2023-08-03 DIAGNOSIS — I4819 Other persistent atrial fibrillation: Secondary | ICD-10-CM

## 2023-08-03 NOTE — Telephone Encounter (Signed)
Eliquis 5mg  refill request received. Patient is 82 years old, weight-123.2kg, Crea- 1.30 on 01/10/23, Diagnosis-Afib, and last seen by Georgie Chard on 01/08/23. Dose is appropriate based on dosing criteria. Will send in refill to requested pharmacy.

## 2023-08-07 DIAGNOSIS — H25812 Combined forms of age-related cataract, left eye: Secondary | ICD-10-CM | POA: Diagnosis not present

## 2023-10-02 NOTE — Telephone Encounter (Signed)
Error in charting.

## 2023-11-03 ENCOUNTER — Other Ambulatory Visit: Payer: Self-pay | Admitting: Cardiology

## 2023-11-08 ENCOUNTER — Encounter (HOSPITAL_COMMUNITY): Payer: Self-pay | Admitting: Emergency Medicine

## 2023-11-08 ENCOUNTER — Other Ambulatory Visit: Payer: Self-pay

## 2023-11-08 ENCOUNTER — Observation Stay (HOSPITAL_COMMUNITY)
Admission: EM | Admit: 2023-11-08 | Discharge: 2023-11-09 | Disposition: A | Discharge status: Home / Self Care | Payer: Medicare Other | Attending: Family Medicine | Admitting: Family Medicine

## 2023-11-08 ENCOUNTER — Emergency Department (HOSPITAL_COMMUNITY): Payer: Medicare Other

## 2023-11-08 DIAGNOSIS — E1122 Type 2 diabetes mellitus with diabetic chronic kidney disease: Secondary | ICD-10-CM | POA: Diagnosis not present

## 2023-11-08 DIAGNOSIS — Z7901 Long term (current) use of anticoagulants: Secondary | ICD-10-CM | POA: Diagnosis not present

## 2023-11-08 DIAGNOSIS — D631 Anemia in chronic kidney disease: Secondary | ICD-10-CM | POA: Diagnosis not present

## 2023-11-08 DIAGNOSIS — M25519 Pain in unspecified shoulder: Secondary | ICD-10-CM | POA: Diagnosis not present

## 2023-11-08 DIAGNOSIS — E119 Type 2 diabetes mellitus without complications: Secondary | ICD-10-CM

## 2023-11-08 DIAGNOSIS — R0602 Shortness of breath: Principal | ICD-10-CM

## 2023-11-08 DIAGNOSIS — I509 Heart failure, unspecified: Secondary | ICD-10-CM | POA: Diagnosis not present

## 2023-11-08 DIAGNOSIS — E876 Hypokalemia: Secondary | ICD-10-CM | POA: Insufficient documentation

## 2023-11-08 DIAGNOSIS — E039 Hypothyroidism, unspecified: Secondary | ICD-10-CM | POA: Insufficient documentation

## 2023-11-08 DIAGNOSIS — I110 Hypertensive heart disease with heart failure: Secondary | ICD-10-CM | POA: Diagnosis not present

## 2023-11-08 DIAGNOSIS — I130 Hypertensive heart and chronic kidney disease with heart failure and stage 1 through stage 4 chronic kidney disease, or unspecified chronic kidney disease: Secondary | ICD-10-CM | POA: Diagnosis not present

## 2023-11-08 DIAGNOSIS — Z952 Presence of prosthetic heart valve: Secondary | ICD-10-CM | POA: Insufficient documentation

## 2023-11-08 DIAGNOSIS — N1831 Chronic kidney disease, stage 3a: Secondary | ICD-10-CM | POA: Insufficient documentation

## 2023-11-08 DIAGNOSIS — I4819 Other persistent atrial fibrillation: Secondary | ICD-10-CM | POA: Diagnosis not present

## 2023-11-08 DIAGNOSIS — J9601 Acute respiratory failure with hypoxia: Secondary | ICD-10-CM

## 2023-11-08 DIAGNOSIS — Z1152 Encounter for screening for COVID-19: Secondary | ICD-10-CM | POA: Insufficient documentation

## 2023-11-08 DIAGNOSIS — Z79899 Other long term (current) drug therapy: Secondary | ICD-10-CM | POA: Diagnosis not present

## 2023-11-08 DIAGNOSIS — I2510 Atherosclerotic heart disease of native coronary artery without angina pectoris: Secondary | ICD-10-CM | POA: Insufficient documentation

## 2023-11-08 DIAGNOSIS — Z6841 Body Mass Index (BMI) 40.0 and over, adult: Secondary | ICD-10-CM | POA: Diagnosis not present

## 2023-11-08 DIAGNOSIS — R6889 Other general symptoms and signs: Secondary | ICD-10-CM | POA: Diagnosis not present

## 2023-11-08 DIAGNOSIS — I350 Nonrheumatic aortic (valve) stenosis: Secondary | ICD-10-CM | POA: Diagnosis not present

## 2023-11-08 DIAGNOSIS — I5031 Acute diastolic (congestive) heart failure: Secondary | ICD-10-CM | POA: Diagnosis present

## 2023-11-08 DIAGNOSIS — R918 Other nonspecific abnormal finding of lung field: Secondary | ICD-10-CM | POA: Diagnosis not present

## 2023-11-08 DIAGNOSIS — I447 Left bundle-branch block, unspecified: Secondary | ICD-10-CM | POA: Diagnosis not present

## 2023-11-08 DIAGNOSIS — E785 Hyperlipidemia, unspecified: Secondary | ICD-10-CM | POA: Insufficient documentation

## 2023-11-08 DIAGNOSIS — I10 Essential (primary) hypertension: Secondary | ICD-10-CM | POA: Diagnosis present

## 2023-11-08 DIAGNOSIS — I5033 Acute on chronic diastolic (congestive) heart failure: Secondary | ICD-10-CM | POA: Diagnosis not present

## 2023-11-08 DIAGNOSIS — R0689 Other abnormalities of breathing: Secondary | ICD-10-CM | POA: Diagnosis not present

## 2023-11-08 DIAGNOSIS — Z743 Need for continuous supervision: Secondary | ICD-10-CM | POA: Diagnosis not present

## 2023-11-08 DIAGNOSIS — I517 Cardiomegaly: Secondary | ICD-10-CM | POA: Diagnosis not present

## 2023-11-08 LAB — BASIC METABOLIC PANEL
Anion gap: 9 (ref 5–15)
BUN: 22 mg/dL (ref 8–23)
CO2: 26 mmol/L (ref 22–32)
Calcium: 8.8 mg/dL — ABNORMAL LOW (ref 8.9–10.3)
Chloride: 107 mmol/L (ref 98–111)
Creatinine, Ser: 1.16 mg/dL — ABNORMAL HIGH (ref 0.44–1.00)
GFR, Estimated: 48 mL/min — ABNORMAL LOW (ref >60)
Glucose, Bld: 169 mg/dL — ABNORMAL HIGH (ref 70–99)
Potassium: 3.2 mmol/L — ABNORMAL LOW (ref 3.5–5.1)
Sodium: 142 mmol/L (ref 135–145)

## 2023-11-08 LAB — CBC WITH DIFFERENTIAL/PLATELET
Abs Immature Granulocytes: 0.06 10*3/uL (ref 0.00–0.07)
Basophils Absolute: 0.1 10*3/uL (ref 0.0–0.1)
Basophils Relative: 1 %
Eosinophils Absolute: 0.3 10*3/uL (ref 0.0–0.5)
Eosinophils Relative: 3 %
HCT: 39.7 % (ref 36.0–46.0)
Hemoglobin: 12.7 g/dL (ref 12.0–15.0)
Immature Granulocytes: 1 %
Lymphocytes Relative: 13 %
Lymphs Abs: 1.3 10*3/uL (ref 0.7–4.0)
MCH: 30.8 pg (ref 26.0–34.0)
MCHC: 32 g/dL (ref 30.0–36.0)
MCV: 96.1 fL (ref 80.0–100.0)
Monocytes Absolute: 0.7 10*3/uL (ref 0.1–1.0)
Monocytes Relative: 7 %
Neutro Abs: 7.5 10*3/uL (ref 1.7–7.7)
Neutrophils Relative %: 75 %
Platelets: 177 10*3/uL (ref 150–400)
RBC: 4.13 MIL/uL (ref 3.87–5.11)
RDW: 15.1 % (ref 11.5–15.5)
WBC: 9.9 10*3/uL (ref 4.0–10.5)
nRBC: 0 % (ref 0.0–0.2)

## 2023-11-08 LAB — BRAIN NATRIURETIC PEPTIDE: B Natriuretic Peptide: 211 pg/mL — ABNORMAL HIGH (ref 0.0–100.0)

## 2023-11-08 LAB — RESP PANEL BY RT-PCR (RSV, FLU A&B, COVID)  RVPGX2
Influenza A by PCR: NEGATIVE
Influenza B by PCR: NEGATIVE
Resp Syncytial Virus by PCR: NEGATIVE
SARS Coronavirus 2 by RT PCR: NEGATIVE

## 2023-11-08 LAB — CBG MONITORING, ED
Glucose-Capillary: 163 mg/dL — ABNORMAL HIGH (ref 70–99)
Glucose-Capillary: 156 mg/dL — ABNORMAL HIGH (ref 70–99)
Glucose-Capillary: 114 mg/dL — ABNORMAL HIGH (ref 70–99)

## 2023-11-08 LAB — TROPONIN I (HIGH SENSITIVITY)
Troponin I (High Sensitivity): 19 ng/L — ABNORMAL HIGH (ref <18)
Troponin I (High Sensitivity): 26 ng/L — ABNORMAL HIGH (ref <18)

## 2023-11-08 LAB — HEMOGLOBIN A1C
Hgb A1c MFr Bld: 6.4 % — ABNORMAL HIGH (ref 4.8–5.6)
Mean Plasma Glucose: 136.98 mg/dL

## 2023-11-08 LAB — GLUCOSE, CAPILLARY: Glucose-Capillary: 127 mg/dL — ABNORMAL HIGH (ref 70–99)

## 2023-11-08 LAB — MAGNESIUM: Magnesium: 2.4 mg/dL (ref 1.7–2.4)

## 2023-11-08 MED ORDER — ONDANSETRON HCL 4 MG PO TABS: 4.0000 mg | ORAL_TABLET | Freq: Four times a day (QID) | ORAL | Status: DC | PRN

## 2023-11-08 MED ORDER — AMIODARONE HCL 200 MG PO TABS
200.0000 mg | ORAL_TABLET | Freq: Every day | ORAL | Status: DC
  Administered 2023-11-08 – 2023-11-09 (×2): 200 mg via ORAL
  Filled 2023-11-08 (×2): qty 1

## 2023-11-08 MED ORDER — INSULIN ASPART 100 UNIT/ML IJ SOLN
0.0000 [IU] | Freq: Every day | INTRAMUSCULAR | Status: DC
  Filled 2023-11-08: qty 0.05

## 2023-11-08 MED ORDER — APIXABAN 5 MG PO TABS
5.0000 mg | ORAL_TABLET | Freq: Two times a day (BID) | ORAL | Status: DC
  Administered 2023-11-08 – 2023-11-09 (×2): 5 mg via ORAL
  Filled 2023-11-08 (×2): qty 1

## 2023-11-08 MED ORDER — INSULIN ASPART 100 UNIT/ML IJ SOLN
0.0000 [IU] | Freq: Three times a day (TID) | INTRAMUSCULAR | Status: DC
  Administered 2023-11-08: 3 [IU] via SUBCUTANEOUS
  Administered 2023-11-09 (×2): 2 [IU] via SUBCUTANEOUS
  Filled 2023-11-08: qty 0.15

## 2023-11-08 MED ORDER — ALBUTEROL SULFATE HFA 108 (90 BASE) MCG/ACT IN AERS: 2.0000 | INHALATION_SPRAY | RESPIRATORY_TRACT | Status: DC | PRN

## 2023-11-08 MED ORDER — HYDRALAZINE HCL 20 MG/ML IJ SOLN
5.0000 mg | Freq: Four times a day (QID) | INTRAMUSCULAR | Status: DC | PRN
  Administered 2023-11-09: 5 mg via INTRAVENOUS
  Filled 2023-11-08: qty 1

## 2023-11-08 MED ORDER — POTASSIUM CHLORIDE CRYS ER 10 MEQ PO TBCR
40.0000 meq | EXTENDED_RELEASE_TABLET | Freq: Every day | ORAL | Status: DC
  Administered 2023-11-09: 40 meq via ORAL
  Filled 2023-11-08: qty 4

## 2023-11-08 MED ORDER — METOPROLOL SUCCINATE ER 25 MG PO TB24
25.0000 mg | ORAL_TABLET | Freq: Every day | ORAL | Status: DC
  Administered 2023-11-08 – 2023-11-09 (×2): 25 mg via ORAL
  Filled 2023-11-08 (×2): qty 1

## 2023-11-08 MED ORDER — TRAZODONE HCL 50 MG PO TABS: 25.0000 mg | ORAL_TABLET | Freq: Every evening | ORAL | Status: DC | PRN

## 2023-11-08 MED ORDER — FUROSEMIDE 10 MG/ML IJ SOLN
40.0000 mg | Freq: Once | INTRAMUSCULAR | Status: AC
  Administered 2023-11-08: 40 mg via INTRAVENOUS
  Filled 2023-11-08: qty 4

## 2023-11-08 MED ORDER — ONDANSETRON HCL 4 MG/2ML IJ SOLN: 4.0000 mg | Freq: Four times a day (QID) | INTRAMUSCULAR | Status: DC | PRN

## 2023-11-08 MED ORDER — FUROSEMIDE 10 MG/ML IJ SOLN
40.0000 mg | Freq: Two times a day (BID) | INTRAMUSCULAR | Status: DC
  Administered 2023-11-08 – 2023-11-09 (×2): 40 mg via INTRAVENOUS
  Filled 2023-11-08 (×2): qty 4

## 2023-11-08 MED ORDER — HYDRALAZINE HCL 20 MG/ML IJ SOLN
10.0000 mg | Freq: Once | INTRAMUSCULAR | Status: AC
  Administered 2023-11-08: 10 mg via INTRAVENOUS
  Filled 2023-11-08: qty 1

## 2023-11-08 MED ORDER — POTASSIUM CHLORIDE CRYS ER 20 MEQ PO TBCR
40.0000 meq | EXTENDED_RELEASE_TABLET | Freq: Once | ORAL | Status: AC
  Administered 2023-11-08: 40 meq via ORAL
  Filled 2023-11-08: qty 2

## 2023-11-08 MED ORDER — ACETAMINOPHEN 325 MG PO TABS: 650.0000 mg | ORAL_TABLET | Freq: Four times a day (QID) | ORAL | Status: DC | PRN

## 2023-11-08 MED ORDER — ACETAMINOPHEN 650 MG RE SUPP: 650.0000 mg | Freq: Four times a day (QID) | RECTAL | Status: DC | PRN

## 2023-11-08 NOTE — ED Notes (Signed)
Pt ambulatory to bathroom w/o assist. Steady gait. No LOB noted. No c/o SOB while ambulating.

## 2023-11-08 NOTE — ED Notes (Signed)
ED TO INPATIENT HANDOFF REPORT  Name/Age/Gender [name] [name] 82 y.o. female  Code Status    Code Status Orders  (From admission, onward)           Start     Ordered   11/08/23 1030  Full code  Continuous       Question:  By:  Answer:  Consent: discussion documented in EHR   11/08/23 1031           Code Status History     Date Active Date Inactive Code Status Order ID Comments User Context   12/06/2022 2343 12/08/2022 1936 Full Code [id number]  Joellen Jersey., MD ED   11/28/2022 1848 11/29/2022 1742 Full Code [id number]  Janetta Hora, PA-C Inpatient   06/01/2020 1601 06/04/2020 1639 Full Code [id number]  Drema Dallas, MD ED       Home/SNF/Other Home  Chief Complaint Acute diastolic (congestive) heart failure (HCC) [I50.31]  Level of Care/Admitting Diagnosis ED Disposition     ED Disposition  Admit   Condition  --   Comment  Hospital Area[facility] [[id number]]  Level of Care: Telemetry [5]  Admit to tele based on following criteria: Acute CHF  May place patient in observation at [facility] or [facility] if equivalent level of care is available:: Yes  Covid Evaluation: Asymptomatic - no recent exposure (last 10 days) testing not required  Diagnosis: Acute diastolic (congestive) heart failure [facility]) [[id number]]  Admitting Physician: Maryln Gottron [[id number]]  Attending Physician: Olexa.Dam, MIR Jaxson.Roy [[id number]]          Medical History Past Medical History:  Diagnosis Date   Anemia    hx of   Arthritis    knee and shoulders   Atrial fibrillation (HCC)    Carpal tunnel syndrome on right    Coronary artery disease 05/2020   Minimal, nobstructive CAD. Tortuous cororany arteries.   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 Clifton James & Dr. Delia Chimes.    Allergies Allergies  Allergen Reactions   Tetanus-Diphtheria Toxoids Td Other (See Comments)    Felt very sick   Cardizem [Diltiazem] Rash    IV Location/Drains/Wounds Patient Lines/Drains/Airways Status     Active Line/Drains/Airways     Name Placement date Placement time Site Days   Peripheral IV 11/08/23 20 G 1" Left Antecubital 11/08/23  0844  Antecubital  less than 1            Labs/Imaging Results for orders placed or performed during the hospital encounter of 11/08/23 (from the past 48 hour(s))  Basic metabolic panel     Status: Abnormal   Collection Time: 11/08/23  8:14 AM  Result Value Ref Range   Sodium 142 135 - 145 mmol/L   Potassium 3.2 (L) 3.5 - 5.1 mmol/L  Chloride 107 98 - 111 mmol/L   CO2 26 22 - 32 mmol/L   Glucose, Bld 169 (H) 70 - 99 mg/dL    Comment: Glucose reference range applies only to samples taken after fasting for at least 8 hours.   BUN 22 8 - 23 mg/dL   Creatinine, Ser 4.09 (H) 0.44 - 1.00 mg/dL   Calcium 8.8 (L) 8.9 - 10.3 mg/dL   GFR, Estimated 48 (L) >60 mL/min    Comment: (NOTE) Calculated using the CKD-EPI Creatinine Equation (2021)    Anion gap 9 5 - 15    Comment: Performed at [facility], [address], [place], [place] [address]  Brain natriuretic peptide     Status: Abnormal   Collection Time: 11/08/23  8:14 AM  Result Value Ref Range   B Natriuretic Peptide 211.0 (H) 0.0 - 100.0 pg/mL    Comment: Performed at [facility], [address] [place], [place] [address]  CBC with Differential     Status: None   Collection  Time: 11/08/23  8:14 AM  Result Value Ref Range   WBC 9.9 4.0 - 10.5 K/uL   RBC 4.13 3.87 - 5.11 MIL/uL   Hemoglobin 12.7 12.0 - 15.0 g/dL   HCT 56.2 13.0 - 86.5 %   MCV 96.1 80.0 - 100.0 fL   MCH 30.8 26.0 - 34.0 pg   MCHC 32.0 30.0 - 36.0 g/dL   RDW 78.4 69.6 - 29.5 %   Platelets 177 150 - 400 K/uL   nRBC 0.0 0.0 - 0.2 %   Neutrophils Relative % 75 %   Neutro Abs 7.5 1.7 - 7.7 K/uL   Lymphocytes Relative 13 %   Lymphs Abs 1.3 0.7 - 4.0 K/uL   Monocytes Relative 7 %   Monocytes Absolute 0.7 0.1 - 1.0 K/uL   Eosinophils Relative 3 %   Eosinophils Absolute 0.3 0.0 - 0.5 K/uL   Basophils Relative 1 %   Basophils Absolute 0.1 0.0 - 0.1 K/uL   Immature Granulocytes 1 %   Abs Immature Granulocytes 0.06 0.00 - 0.07 K/uL    Comment: Performed at [facility], [address] [place], [place] [address]  Troponin I (High Sensitivity)     Status: Abnormal   Collection Time: 11/08/23  8:14 AM  Result Value Ref Range   Troponin I (High Sensitivity) 19 (H) <18 ng/L    Comment: (NOTE) Elevated high sensitivity troponin I (hsTnI) values and significant  changes across serial measurements may suggest ACS but many other  chronic and acute conditions are known to elevate hsTnI results.  Refer to the "Links" section for chest pain algorithms and additional  guidance. Performed at [facility], [address], [place], [place] [address]   Resp panel by RT-PCR (RSV, Flu A&B, Covid) Anterior Nasal Swab     Status: None   Collection Time: 11/08/23  8:19 AM   Specimen: Anterior Nasal Swab  Result Value Ref Range   SARS Coronavirus 2 by RT PCR NEGATIVE NEGATIVE    Comment: (NOTE) SARS-CoV-2 target nucleic acids are NOT DETECTED.  The SARS-CoV-2 RNA is generally detectable in upper respiratory specimens during the acute phase of infection. The lowest concentration of SARS-CoV-2 viral copies this assay can detect is 138 copies/mL. A negative result does not  preclude SARS-Cov-2 infection and should not be used as the sole basis for treatment or other patient management decisions. A negative result may occur with  improper specimen collection/handling, submission of specimen other than nasopharyngeal swab, presence of viral mutation(s) within the areas targeted by this assay, and inadequate number of viral copies(<138 copies/mL). A negative result must be combined with clinical observations, patient history, and epidemiological information. The expected result is Negative.  Fact Sheet for Patients:  BloggerCourse.com  Fact Sheet for Healthcare Providers:  SeriousBroker.it  This test is no t yet approved or cleared by the [place] FDA and  has been authorized for detection and/or diagnosis of SARS-CoV-2 by FDA under an Emergency Use Authorization (EUA). This EUA will remain  in effect (meaning this test can be used) for the duration of the COVID-19 declaration under Section 564(b)(1) of the Act, 21 U.S.C.section 360bbb-3(b)(1), unless the authorization is terminated  or revoked sooner.       Influenza A by PCR NEGATIVE NEGATIVE   Influenza B by PCR NEGATIVE NEGATIVE    Comment: (NOTE) The Xpert Xpress SARS-CoV-2/FLU/RSV plus assay is intended as an aid in the diagnosis of influenza from Nasopharyngeal swab specimens and should not be used as a sole basis for treatment. Nasal washings and aspirates are unacceptable for Xpert Xpress SARS-CoV-2/FLU/RSV testing.  Fact Sheet for Patients: BloggerCourse.com  Fact Sheet for Healthcare Providers: SeriousBroker.it  This test is not yet approved or cleared by the [place] FDA and has been authorized for detection and/or diagnosis of SARS-CoV-2 by FDA under an Emergency Use Authorization (EUA). This EUA will remain in effect (meaning this test can be used) for the duration of  the COVID-19 declaration under Section 564(b)(1) of the Act, 21 U.S.C. section 360bbb-3(b)(1), unless the authorization is terminated or revoked.     Resp Syncytial Virus by PCR NEGATIVE NEGATIVE    Comment: (NOTE) Fact Sheet for Patients: BloggerCourse.com  Fact Sheet for Healthcare Providers: SeriousBroker.it  This test is not yet approved or cleared by the [place] FDA and has been authorized for detection and/or diagnosis of SARS-CoV-2 by FDA under an Emergency Use Authorization (EUA). This EUA will remain in effect (meaning this test can be used) for the duration of the COVID-19 declaration under Section 564(b)(1) of the Act, 21 U.S.C. section 360bbb-3(b)(1), unless the authorization is terminated or revoked.  Performed at [facility], [address] [place], [place] [address]   Magnesium     Status: None   Collection Time: 11/08/23  9:58 AM  Result Value Ref Range   Magnesium 2.4 1.7 - 2.4 mg/dL    Comment: Performed at [facility], [address], [place], [place] [address]  Troponin I (High Sensitivity)     Status: Abnormal   Collection Time: 11/08/23 10:45 AM  Result Value Ref Range   Troponin I (High Sensitivity) 26 (H) <18 ng/L    Comment: (NOTE) Elevated high sensitivity troponin I (hsTnI) values and significant  changes across serial measurements may suggest ACS but many other  chronic and acute conditions are known to elevate hsTnI results.  Refer to the "Links" section for chest pain algorithms and additional  guidance. Performed at [facility], [address] [place], [place] [address]   Hemoglobin A1c     Status: Abnormal   Collection Time: 11/08/23 10:45 AM  Result Value Ref Range   Hgb A1c MFr Bld 6.4 (H) 4.8 - 5.6 %    Comment: (NOTE) Pre diabetes:          5.7%-6.4%  Diabetes:              >  6.4%  Glycemic control for    <7.0% adults with diabetes    Mean Plasma Glucose 136.98 mg/dL    Comment: Performed at [facility], [address], [place], [place] [address]  CBG monitoring, ED     Status: Abnormal   Collection Time: 11/08/23 11:43 AM  Result Value Ref Range   Glucose-Capillary 163 (H) 70 - 99 mg/dL    Comment: Glucose reference range applies only to samples taken after fasting for at least 8 hours.  CBG monitoring, ED     Status: Abnormal   Collection Time: 11/08/23 12:46 PM  Result Value Ref Range   Glucose-Capillary 156 (H) 70 - 99 mg/dL    Comment: Glucose reference range applies only to samples taken after fasting for at least 8 hours.  CBG monitoring, ED     Status: Abnormal   Collection Time: 11/08/23  5:00 PM  Result Value Ref Range   Glucose-Capillary 114 (H) 70 - 99 mg/dL    Comment: Glucose reference range applies only to samples taken after fasting for at least 8 hours.   DG Chest 2 View  Result Date: 11/08/2023 CLINICAL DATA:  Shortness of breath. EXAM: CHEST - 2 VIEW COMPARISON:  Chest radiograph dated December 06, 2022. FINDINGS: The heart is enlarged. The mediastinal contours are within normal limits. TAVR. Diffuse bilateral interstitial prominence. No pleural effusion or pneumothorax. No acute osseous abnormality. IMPRESSION: Cardiomegaly with findings suggestive of interstitial pulmonary edema. Electronically Signed   By: Hart Robinsons M.D.   On: 11/08/2023 09:43    Pending Labs Unresulted Labs (From admission, onward)     Start     Ordered   11/09/23 0500  Basic metabolic panel  Tomorrow morning,   R        11/08/23 1031   11/09/23 0500  CBC  Tomorrow morning,   R        11/08/23 1031            Vitals/Pain Today's Vitals   11/08/23 1145 11/08/23 1400 11/08/23 1500 11/08/23 1540  BP:  (!) 155/56 (!) 163/61   Pulse:  (!) 59 61   Resp:  19 18   Temp: 98 F (36.7 C)   97.8 F (36.6 C)  TempSrc: Oral   Oral  SpO2:  98% 98%   Weight:      Height:       PainSc:        Isolation Precautions No active isolations  Medications Medications  albuterol (VENTOLIN HFA) 108 (90 Base) MCG/ACT inhaler 2 puff (has no administration in time range)  amiodarone (PACERONE) tablet 200 mg (has no administration in time range)  metoprolol succinate (TOPROL-XL) 24 hr tablet 25 mg (has no administration in time range)  apixaban (ELIQUIS) tablet 5 mg (has no administration in time range)  potassium chloride (KLOR-CON M) CR tablet 40 mEq (has no administration in time range)  insulin aspart (novoLOG) injection 0-15 Units (3 Units Subcutaneous Given 11/08/23 1316)  insulin aspart (novoLOG) injection 0-5 Units (has no administration in time range)  acetaminophen (TYLENOL) tablet 650 mg (has no administration in time range)    Or  acetaminophen (TYLENOL) suppository 650 mg (has no administration in time range)  traZODone (DESYREL) tablet 25 mg (has no administration in time range)  ondansetron (ZOFRAN) tablet 4 mg (has no administration in time range)    Or  ondansetron (ZOFRAN) injection 4 mg (has no administration in time range)  hydrALAZINE (APRESOLINE) injection 5 mg (  has no administration in time range)  furosemide (LASIX) injection 40 mg (has no administration in time range)  potassium chloride SA (KLOR-CON M) CR tablet 40 mEq (40 mEq Oral Given 11/08/23 1048)  furosemide (LASIX) injection 40 mg (40 mg Intravenous Given 11/08/23 1048)  hydrALAZINE (APRESOLINE) injection 10 mg (10 mg Intravenous Given 11/08/23 1057)    Mobility walks

## 2023-11-08 NOTE — ED Provider Notes (Signed)
[place] EMERGENCY DEPARTMENT AT [facility] Provider Note   CSN: [id number] Arrival date & time: 11/08/23  8295     History  Chief Complaint  Patient presents with   Shortness of Breath    [name] [name] is a 82 y.o. female history of A-fib on Eliquis, status post TAVR, left bundle branch block, CHF, diabetes, hypertension presented with shortness of breath that began at 5 AM this morning.  Patient is woke her up out of her sleep and states that she is normally short of breath with her heart failure but states that this is new that she is short of breath at rest.  Patient states that she is coughing up clear sputum but denies any fevers, leg swelling, recent travel/hospitalization/surgery, previous history of blood clots.  Patient's been compliant with her Eliquis.  Patient denies any fevers but does note that she has mild left chest discomfort that has been around for "a while."  Chest discomfort does not radiate and comes and goes however patient is unsure of the cause of this.  Patient does not normally wear oxygen however is on 2 L while in the room for comfort.  Last echo 01/08/2023: LVEF 55 to 60%, grade 2 diastolic dysfunction, moderate paravalvular leak in the aortic valve, moderate aortic valve regurgitation  Home Medications Prior to Admission medications   Medication Sig Start Date End Date Taking? Authorizing Provider  acetaminophen (TYLENOL) 500 MG tablet Take 1,000 mg by mouth daily as needed for moderate pain or headache.    [provider]  amiodarone (PACERONE) 200 MG tablet TAKE 1 TABLET BY MOUTH EVERY DAY 03/19/23   Lanier Prude, MD  amoxicillin (AMOXIL) 500 MG capsule Take 4 capsules (2,000 mg total) by mouth as directed. Take 4 tablets 1 hour prior to dental work, including cleanings. 01/08/23   Georgie Chard D, NP  Calcium Carb-Cholecalciferol (CALCIUM 600+D) 600-800 MG-UNIT TABS Take 1 tablet by mouth every evening.     [provider]   ELIQUIS 5 MG TABS tablet TAKE 1 TABLET BY MOUTH TWICE A DAY 08/03/23   Kathleene Hazel, MD  Hydrocortisone-Aloe Vera (CORTIZONE-10/ALOE EX) Apply 1 application topically 2 (two) times daily as needed (itching).    [provider]  Liniments (BLUE-EMU SUPER STRENGTH) CREA Apply 1 application  topically daily as needed (arthritis).    [provider]  Menthol (HALLS COUGH DROPS MT) Use as directed 1-2 each in the mouth or throat every Sunday.    [provider]  metoprolol succinate (TOPROL-XL) 25 MG 24 hr tablet TAKE 1 TABLET (25 MG TOTAL) BY MOUTH DAILY. 03/02/23 03/01/24  Parke Poisson, MD  Multiple Vitamins-Minerals (ONE A DAY WOMEN 50 PLUS PO) Take 1 tablet by mouth every evening.    [provider]  Multiple Vitamins-Minerals (PRESERVISION AREDS 2 PO) Take 1 capsule by mouth 2 (two) times daily.     [provider]  Phenol [facility] MT) Use as directed 1 spray in the mouth or throat at bedtime.    [provider]  Potassium Chloride ER 20 MEQ TBCR TAKE 2 TABLETS BY MOUTH DAILY 11/06/23   Pricilla Riffle, MD  torsemide (DEMADEX) 20 MG tablet TAKE 1 TABLET BY MOUTH TWICE A DAY 07/19/23   Kathleene Hazel, MD      Allergies    Tetanus-diphtheria toxoids td and Cardizem [diltiazem]    Review of Systems   Review of Systems  Respiratory:  Positive for shortness of  breath.     Physical Exam Updated Vital Signs BP (!) 178/65   Pulse 62   Temp 97.6 F (36.4 C) (Oral)   Resp 13   Ht 5\' 6"  (1.676 m)   Wt 127.5 kg   SpO2 100%   BMI 45.35 kg/m  Physical Exam Vitals reviewed.  Constitutional:      General: She is not in acute distress. HENT:     Head: Normocephalic and atraumatic.  Eyes:     Extraocular Movements: Extraocular movements intact.     Conjunctiva/sclera: Conjunctivae normal.     Pupils: Pupils are equal, round, and reactive to light.  Cardiovascular:     Rate and Rhythm: Normal rate and regular  rhythm.     Pulses: Normal pulses.     Heart sounds: Normal heart sounds.     Comments: 2+ bilateral radial/dorsalis pedis pulses with regular rate Pulmonary:     Effort: Pulmonary effort is normal. No respiratory distress.     Breath sounds: Normal breath sounds.  Abdominal:     Palpations: Abdomen is soft.     Tenderness: There is no abdominal tenderness. There is no guarding or rebound.  Musculoskeletal:        General: Normal range of motion.     Cervical back: Normal range of motion and neck supple.     Right lower leg: No tenderness. No edema.     Left lower leg: No tenderness. No edema.     Comments: 5 out of 5 bilateral grip/leg extension strength  Skin:    General: Skin is warm and dry.     Capillary Refill: Capillary refill takes less than 2 seconds.     Comments: Bilateral erythema noted to both lower extremities however this has been chronic as patient is recovering from a blister there, no warmth or purulent discharge no areas of fluctuance  Neurological:     General: No focal deficit present.     Mental Status: She is alert and oriented to person, place, and time.     Comments: Sensation intact in all 4 limbs  Psychiatric:        Mood and Affect: Mood normal.     ED Results / Procedures / Treatments   Labs (all labs ordered are listed, but only abnormal results are displayed) Labs Reviewed  BASIC METABOLIC PANEL - Abnormal; Notable for the following components:      Result Value   Potassium 3.2 (*)    Glucose, Bld 169 (*)    Creatinine, Ser 1.16 (*)    Calcium 8.8 (*)    GFR, Estimated 48 (*)    All other components within normal limits  BRAIN NATRIURETIC PEPTIDE - Abnormal; Notable for the following components:   B Natriuretic Peptide 211.0 (*)    All other components within normal limits  TROPONIN I (HIGH SENSITIVITY) - Abnormal; Notable for the following components:   Troponin I (High Sensitivity) 19 (*)    All other components within normal limits   RESP PANEL BY RT-PCR (RSV, FLU A&B, COVID)  RVPGX2  CBC WITH DIFFERENTIAL/PLATELET  MAGNESIUM    EKG EKG Interpretation Date/Time:  Thursday November 08 2023 07:47:41 EST Ventricular Rate:  71 PR Interval:  208 QRS Duration:  167 QT Interval:  488 QTC Calculation: 531 R Axis:   -48  Text Interpretation: Sinus rhythm Ventricular premature complex Left bundle branch block when compared to prior, overall similar appearance. No STEMI Confirmed by Theda Belfast ([id number]) on 11/08/2023 8:15:30 AM  Radiology No results found.  Procedures .Critical Care  Performed by: Netta Corrigan, PA-C Authorized by: Netta Corrigan, PA-C   Critical care provider statement:    Critical care time (minutes):  30   Critical care time was exclusive of:  Separately billable procedures and treating other patients   Critical care was necessary to treat or prevent imminent or life-threatening deterioration of the following conditions:  Respiratory failure   Critical care was time spent personally by me on the following activities:  Blood draw for specimens, development of treatment plan with patient or surrogate, discussions with consultants, evaluation of patient's response to treatment, examination of patient, obtaining history from patient or surrogate, review of old charts, re-evaluation of patient's condition, pulse oximetry, ordering and review of radiographic studies, ordering and review of laboratory studies and ordering and performing treatments and interventions   I assumed direction of critical care for this patient from another provider in my specialty: no     Care discussed with: admitting provider       Medications Ordered in ED Medications  albuterol (VENTOLIN HFA) 108 (90 Base) MCG/ACT inhaler 2 puff (has no administration in time range)  potassium chloride SA (KLOR-CON M) CR tablet 40 mEq (has no administration in time range)  furosemide (LASIX) injection 40 mg (has no administration in  time range)  hydrALAZINE (APRESOLINE) injection 10 mg (has no administration in time range)    ED Course/ Medical Decision Making/ A&P                                 Medical Decision Making Amount and/or Complexity of Data Reviewed Labs: ordered. Radiology: ordered.  Risk Prescription drug management. Decision regarding hospitalization.   [name] [name] 82 y.o. presented today for shortness of breath.  Working DDx that I considered at this time includes, but not limited to, asthma/COPD exacerbation, URI, viral illness, anemia, ACS, PE, pneumonia, pleural effusion, lung cancer.  R/o DDx: asthma/COPD exacerbation, URI, viral illness, anemia, ACS, PE, pneumonia, lung cancer: These are considered less likely due to history of present illness, physical exam, labs/imaging findings  Review of prior external notes: 12/08/2022 discharge summary  Unique Tests and My Interpretation:  CBC: Unremarkable BMP: Hypokalemia 3.2 EKG: Sinus 71 bpm, left bundle branch block that has been seen previously, no signs of Sgarbossa criteria met Troponin: 19 CXR: Pulmonary edema Respiratory panel: Negative BNP: 211, down from previous results  Social Determinants of Health: none  Discussion with Independent Historian:  Husband  Discussion of Management of Tests:  Ikram, MD Hospitalist  Risk: High: hospitalization or escalation of hospital-level care  Risk Stratification Score: none  Plan: On exam patient was in no acute distress with stable vitals.  Patient's physical dam was largely unremarkable however patient was on 2 L nasal cannula in the room but she states this was for comfort.  Triage note states that when patient was ambulating her oxygen dropped to 85% on room air and so this is why she was placed on nasal cannula.  Patient states she is coughing up clear sputum and does not endorse any recent hospitalizations or surgeries and has been compliant with her Eliquis and so at this time we will  hold off on a CTA to evaluate for PE as I have a low suspicion.  Will start with the basic shortness of breath workup including labs including BNP and troponin along with chest x-ray.  I offered the patient a breathing treatment however patient declined at this time.  Chest x-ray does show pulmonary edema and so we will give Lasix as it is for patient's shortness of breath is from fluid overload.  Hospitalist will be consulted for admission due to new oxygen requirement as well.  When attending evaluated patient patient blood pressure was over 200.  Patient recently has been off of her blood pressure meds and so will also need admission to have her hypertensive meds sorted out as well.  Patient given hydralazine as well to bring her blood pressure back down.  I spoke to the hospitalist and patient was accepted for admission.  Patient stable for admission.  This chart was dictated using voice recognition software.  Despite best efforts to proofread,  errors can occur which can change the documentation meaning.         Final Clinical Impression(s) / ED Diagnoses Final diagnoses:  SOB (shortness of breath)  Acute on chronic congestive heart failure, unspecified heart failure type (HCC)  Acute respiratory failure with hypoxia [facility])    Rx / DC Orders ED Discharge Orders     None         Remi Deter 11/08/23 1053    Tegeler, Canary Brim, MD 11/08/23 [phone]

## 2023-11-08 NOTE — ED Triage Notes (Signed)
Pt BIB EMS from home, c/o shortness of breath with exertion. After ambulation, sats dropped to 85% room air. Placed on 2 liters nasal cannula. 12 lead showed left BBB  BP 150/80 P 70 SpO2 96% 2liters

## 2023-11-08 NOTE — H&P (Signed)
History and Physical  [name] [name] WGN:[id number] DOB: Jan 20, 1941 DOA: 11/08/2023  PCP: Shon Hale, MD   Chief Complaint: Shortness of breath  HPI: [name] [name] is a 82 y.o. 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  Patient states that she has a chronic cough, as well as dyspnea with exertion, however in the last 48 hours she has had progressive dyspnea and shortness of breath even at rest.  She denies lower extremity edema, or increased frequency or change in nature of her cough.  Denies any fevers or chills, chest pain, nausea/vomiting or any other concerns.  States that she has remained compliant with all of her medications.  In the emergency department her potassium was repleted, she was given a dose of IV Lasix for suspected acute on chronic heart failure.  She was also noted to be quite hypertensive, tells me that her previous antihypertensive medications were discontinued back in 2021 when she was first diagnosed with atrial fibrillation.  Review of Systems: Please see HPI for pertinent positives and negatives. A complete 10 system review of systems are otherwise negative.  Past Medical History:  Diagnosis Date   Anemia    hx of   Arthritis    knee and shoulders   Atrial fibrillation (HCC)    Carpal tunnel syndrome on right    Coronary artery disease 05/2020   Minimal, nobstructive CAD. Tortuous cororany arteries.   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 Clifton James & Dr. Delia Chimes.   Past Surgical History:  Procedure Laterality Date   ABDOMINAL HYSTERECTOMY  1992   complete   BILATERAL CARPAL TUNNEL RELEASE  yrs ago   CARDIOVERSION N/A 09/23/2020   Procedure: CARDIOVERSION;  Surgeon: Meriam Sprague, MD;  Location[facility] ENDOSCOPY;  Service: Cardiovascular;  Laterality: N/A;   CARDIOVERSION N/A 03/21/2021   Procedure: CARDIOVERSION;  Surgeon: Christell Constant, MD;  Location: [facility] ENDOSCOPY;  Service: Cardiovascular;  Laterality: N/A;   CHOLECYSTECTOMY  1996   CYSTOSCOPY/URETEROSCOPY/HOLMIUM LASER/STENT PLACEMENT Bilateral 08/19/2020   Procedure: CYSTOSCOPY BILATERAL  RETROGRADE  URETEROSCOPY/HOLMIUM LASER/STENT PLACEMENT;  Surgeon: Bjorn Pippin, MD;  Location: [facility];  Service: Urology;  Laterality: Bilateral;   HAMMER TOE SURGERY Right yrs ago   INTRAOPERATIVE TRANSTHORACIC ECHOCARDIOGRAM N/A 11/28/2022   Procedure: INTRAOPERATIVE TRANSTHORACIC ECHOCARDIOGRAM;  Surgeon: Kathleene Hazel, MD;  Location: [facility] OR;  Service: Open Heart Surgery;  Laterality: N/A;   IR RADIOLOGIST EVAL & MGMT  10/26/2020   IR RADIOLOGIST EVAL & MGMT  01/06/2021   IR RADIOLOGIST EVAL & MGMT  03/22/2021   IR RADIOLOGIST EVAL &  MGMT  07/06/2021   IR RADIOLOGIST EVAL & MGMT  01/11/2022   IR RADIOLOGIST EVAL & MGMT  01/15/2023   LEFT HEART CATH AND CORONARY ANGIOGRAPHY N/A 06/02/2020   Procedure: LEFT HEART CATH AND CORONARY ANGIOGRAPHY;  Surgeon: Corky Crafts, MD;  Location[facility] INVASIVE CV LAB;  Service: Cardiovascular;  Laterality: N/A;   RADIOLOGY WITH ANESTHESIA N/A 11/17/2020   Procedure: RADIOLOGY WITH ANESTHESIA RENAL MICROWAVE ABLATION;   Surgeon: Bennie Dallas, MD;  Location: [facility] ORS;  Service: Radiology;  Laterality: N/A;   TONSILLECTOMY  age 26   TRANSCATHETER AORTIC VALVE REPLACEMENT, TRANSFEMORAL N/A 11/28/2022   Procedure: Transcatheter Aortic Valve Replacement, Transfemoral using a Medtronic 29 mm EVOLUT FX Aortic Valve;  Surgeon: Kathleene Hazel, MD;  Location: [facility] OR;  Service: Open Heart Surgery;  Laterality: N/A;    Social History:  reports that she has never smoked. She has never used smokeless tobacco. She reports that she does not currently use alcohol. She reports that she does not use drugs.   Allergies  Allergen Reactions   Tetanus-Diphtheria Toxoids Td Other (See Comments)    Felt very sick   Cardizem [Diltiazem] Rash    Family History  Problem Relation Age of Onset   Hypertension Mother    Hypertension Father    Diabetes Paternal Grandmother      Prior to Admission medications   Medication Sig Start Date End Date Taking? Authorizing Provider  acetaminophen (TYLENOL) 500 MG tablet Take 1,000 mg by mouth daily as needed for moderate pain or headache.    [provider]  amiodarone (PACERONE) 200 MG tablet TAKE 1 TABLET BY MOUTH EVERY DAY 03/19/23   Lanier Prude, MD  amoxicillin (AMOXIL) 500 MG capsule Take 4 capsules (2,000 mg total) by mouth as directed. Take 4 tablets 1 hour prior to dental work, including cleanings. 01/08/23   Georgie Chard D, NP  Calcium Carb-Cholecalciferol (CALCIUM 600+D) 600-800 MG-UNIT TABS Take 1 tablet by mouth every evening.     [provider]  ELIQUIS 5 MG TABS tablet TAKE 1 TABLET BY MOUTH TWICE A DAY Patient taking differently: Take 5 mg by mouth 2 (two) times daily. 08/03/23   Kathleene Hazel, MD  Hydrocortisone-Aloe Vera (CORTIZONE-10/ALOE EX) Apply 1 application topically 2 (two) times daily as needed (itching).    [provider]  Liniments (BLUE-EMU SUPER STRENGTH) CREA Apply 1 application  topically daily as needed  (arthritis).    [provider]  Menthol (HALLS COUGH DROPS MT) Use as directed 1-2 each in the mouth or throat every Sunday.    [provider]  metoprolol succinate (TOPROL-XL) 25 MG 24 hr tablet TAKE 1 TABLET (25 MG TOTAL) BY MOUTH DAILY. 03/02/23 03/01/24  Parke Poisson, MD  Multiple Vitamins-Minerals (ONE A DAY WOMEN 50 PLUS PO) Take 1 tablet by mouth every evening.    [provider]  Multiple Vitamins-Minerals (PRESERVISION AREDS 2 PO) Take 1 capsule by mouth 2 (two) times daily.     [provider]  Phenol [facility] MT) Use as directed 1 spray in the mouth or throat at bedtime.    [provider]  Potassium Chloride ER 20 MEQ TBCR TAKE 2 TABLETS BY MOUTH DAILY 11/06/23   Pricilla Riffle, MD  torsemide (DEMADEX) 20 MG tablet TAKE 1 TABLET BY MOUTH TWICE A DAY 07/19/23   Kathleene Hazel, MD    Physical Exam: BP (!) 178/65   Pulse 62   Temp 97.6 F (  36.4 C) (Oral)   Resp 13   Ht 5\' 6"  (1.676 m)   Wt 127.5 kg   SpO2 100%   BMI 45.35 kg/m   General:  Alert, oriented, calm, in no acute distress saturating 99% on 2 L nasal cannula oxygen.  She looks comfortable, and nontoxic.  No cough, speaking in full sentences.  Her husband is at the bedside. Cardiovascular: RRR, no murmurs or rubs, she has 2+ pitting lower extremity edema  Respiratory: clear to auscultation bilaterally, but with bilateral basilar crackles, no tachypnea, wheezing, or other evidence of distress Abdomen: soft, nontender, nondistended, normal bowel tones heard  Skin: dry,, she has nontender bilateral warmth and erythema in her ankles, patient and husband state that this is chronic and unchanged Musculoskeletal: no joint effusions, normal range of motion  Psychiatric: appropriate affect, normal speech  Neurologic: extraocular muscles intact, clear speech, moving all extremities with intact sensorium         Labs on Admission:  Basic Metabolic Panel: Recent Labs   Lab 11/08/23 0814  NA 142  K 3.2*  CL 107  CO2 26  GLUCOSE 169*  BUN 22  CREATININE 1.16*  CALCIUM 8.8*   Liver Function Tests: No results for input(s): "AST", "ALT", "ALKPHOS", "BILITOT", "PROT", "ALBUMIN" in the last 168 hours. No results for input(s): "LIPASE", "AMYLASE" in the last 168 hours. No results for input(s): "AMMONIA" in the last 168 hours. CBC: Recent Labs  Lab 11/08/23 0814  WBC 9.9  NEUTROABS 7.5  HGB 12.7  HCT 39.7  MCV 96.1  PLT 177   Cardiac Enzymes: No results for input(s): "CKTOTAL", "CKMB", "CKMBINDEX", "TROPONINI" in the last 168 hours.  BNP (last 3 results) Recent Labs    12/06/22 1541 11/08/23 0814  BNP 451.5* 211.0*    ProBNP (last 3 results) No results for input(s): "PROBNP" in the last 8760 hours.  CBG: No results for input(s): "GLUCAP" in the last 168 hours.  Radiological Exams on Admission: No results found.  Assessment/Plan [name] [name] is a 82 y.o. 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   Acute on chronic heart failure with preserved EF-she has known grade 2 diastolic dysfunction seen on echo January 2024.  Now presents with hypoxia, lower extremity edema, pulmonary congestion and elevated BNP (though improved from previous value).  Acute hypoxic respiratory failure-I suspect this is due to acute on chronic heart failure as noted below.  Will continue oxygen supplementation, and wean oxygen as able.  Permanent atrial fibrillation-continue Eliquis, amiodarone, and Toprol-XL  Hypertension-I suspect this is exacerbated by her fluid overload, and should improve with diuresis.  In the meantime we will continue her Toprol-XL and give IV hydralazine as needed  for SBP greater than 160  Non-insulin-dependent type 2 diabetes-patient states this is diet controlled. -Heart healthy carb controlled diet -Check hemoglobin A1c -Moderate dose sliding scale  Hypokalemia-in the setting of home diuretic use, orally repleted in the ER.  Magnesium check is pending will continue 40 mill equivalent potassium chloride daily.  Morbid obesity-BMI 45.3, complicating all aspects of care  DVT prophylaxis: Eliquis     Code Status: Full Code  Consults called: None  Admission status: Observation  Time spent: 49 minutes  Rockford Leinen Sharlette Dense MD [facility] Pager [phone]  If  7PM-7AM, please contact night-coverage [email or web address] Password [facility]  11/08/2023, 10:31 AM

## 2023-11-08 NOTE — ED Notes (Signed)
RN called 4W floor to let them know pt is in room.

## 2023-11-09 DIAGNOSIS — I5031 Acute diastolic (congestive) heart failure: Secondary | ICD-10-CM | POA: Diagnosis not present

## 2023-11-09 LAB — CBC
HCT: 37.5 % (ref 36.0–46.0)
Hemoglobin: 12.3 g/dL (ref 12.0–15.0)
MCH: 31.5 pg (ref 26.0–34.0)
MCHC: 32.8 g/dL (ref 30.0–36.0)
MCV: 95.9 fL (ref 80.0–100.0)
Platelets: 179 10*3/uL (ref 150–400)
RBC: 3.91 MIL/uL (ref 3.87–5.11)
RDW: 15.3 % (ref 11.5–15.5)
WBC: 8.8 10*3/uL (ref 4.0–10.5)
nRBC: 0 % (ref 0.0–0.2)

## 2023-11-09 LAB — BASIC METABOLIC PANEL
Anion gap: 10 (ref 5–15)
BUN: 19 mg/dL (ref 8–23)
CO2: 27 mmol/L (ref 22–32)
Calcium: 8.8 mg/dL — ABNORMAL LOW (ref 8.9–10.3)
Chloride: 107 mmol/L (ref 98–111)
Creatinine, Ser: 1.04 mg/dL — ABNORMAL HIGH (ref 0.44–1.00)
GFR, Estimated: 54 mL/min — ABNORMAL LOW (ref >60)
Glucose, Bld: 125 mg/dL — ABNORMAL HIGH (ref 70–99)
Potassium: 3.6 mmol/L (ref 3.5–5.1)
Sodium: 144 mmol/L (ref 135–145)

## 2023-11-09 LAB — GLUCOSE, CAPILLARY
Glucose-Capillary: 127 mg/dL — ABNORMAL HIGH (ref 70–99)
Glucose-Capillary: 131 mg/dL — ABNORMAL HIGH (ref 70–99)

## 2023-11-09 MED ORDER — ORAL CARE MOUTH RINSE: 15.0000 mL | OROMUCOSAL | Status: DC | PRN

## 2023-11-09 NOTE — Discharge Instructions (Signed)
[name] [name],  You were in the hospital with a heart failure exacerbation (extra fluid buildup) that affected your lungs/breathing. This has improved with Lasix via IV. I'm unsure what caused this exacerbation but it is probably related to a slow buildup over time. Please ensure that you weigh yourself daily and call your heart doctor with any daily weight change of about 3 lbs or 3 day weight change of about 5 lbs.

## 2023-11-09 NOTE — Progress Notes (Signed)
Discharge teaching done, Pt and spouse voiced understanding. Discharged to home in self care.

## 2023-11-09 NOTE — TOC CM/SW Note (Signed)
Transition of Care [facility]) - Inpatient Brief Assessment   Patient Details  Name: [name] [name] MRN: [id number] Date of Birth: 1941-03-30  [facility]) CM/SW Contact:    Larrie Kass, LCSW Phone Number: 11/09/2023, 12:38 PM      Transition of Care Asessment: Insurance and Status: Insurance coverage has been reviewed Patient has primary care physician: Yes Home environment has been reviewed: home with spouse Prior level of function:: independent Prior/Current Home Services: No current home services Social Determinants of Health Reivew: SDOH reviewed no interventions necessary Readmission risk has been reviewed: Yes Transition of care needs: no transition of care needs at this time

## 2023-11-09 NOTE — Discharge Summary (Addendum)
Physician Discharge Summary   Patient: [name] [name] MRN: [id number] DOB: 1941-07-11  Admit date:     11/08/2023  Discharge date: 11/09/23  Discharge Physician: Jacquelin Hawking, MD   PCP: Shon Hale, MD   Recommendations at discharge:  PCP visit for hospital follow-up Cardiology visit for heart failure follow-up  Discharge Diagnoses: Principal Problem:   Acute diastolic (congestive) heart failure (HCC) Active Problems:   HTN (hypertension)   Diabetes (HCC)   Hyperlipidemia   Persistent atrial fibrillation (HCC)   Severe aortic stenosis   S/P TAVR (transcatheter aortic valve replacement)  Resolved Problems:   * No resolved hospital problems. *  Hospital Course: [name] [name] is a 82 y.o. female with a history of chronic diastolic heart failure, diabetes mellitus type 2, persistent atrial fibrillation, GERD, CKD stage III, severe aortic stenosis s/p TAVR.  Patient presented secondary to shortness of breath and was found to have evidence of acute on chronic diastolic heart failure. Symptoms improved significantly with Lasix IV diuresis.   Assessment and Plan:  Acute on chronic diastolic heart failure Unclear etiology, but possibly related to slow buildup over time. Patient improved significantly with Lasix IV diuresis. Weight on discharge of 118.9 kg (262.1 lbs). No changes to home regimen of torsemide. Follow-up with PCP and cardiology.  Hypoxia Secondary to heart failure. Not requiring oxygen.  Permanent atrial fibrillation Continue Toprol XL, amiodarone and Eliquis.  Primary hypertension Continue Toprol XL.  Diabetes mellitus type 2, well controlled Hemoglobin A1C of 6.4%. Diet controlled.  Hypokalemia Resolved with supplementation.  Aortic stenosis s/p TAVR Noted.  CKD stage IIIa  Morbid obesity Contributor to presentation and overall management of medical illnesses. Estimated body mass index is 45.35 kg/m as calculated from the following:   Height as  of this encounter: 5\' 6"  (1.676 m).   Weight as of this encounter: 127.5 kg.    Consultants: None Procedures performed: None  Disposition: Home Diet recommendation: Cardiac and Carb modified diet   DISCHARGE MEDICATION: Allergies as of 11/09/2023       Reactions   Tetanus-diphtheria Toxoids Td Other (See Comments)   Felt very sick   Cardizem [diltiazem] Rash        Medication List     TAKE these medications    acetaminophen 500 MG tablet Commonly known as: TYLENOL Take 1,000 mg by mouth daily as needed for moderate pain or headache.   amiodarone 200 MG tablet Commonly known as: PACERONE TAKE 1 TABLET BY MOUTH EVERY DAY   amoxicillin 500 MG capsule Commonly known as: AMOXIL Take 4 capsules (2,000 mg total) by mouth as directed. Take 4 tablets 1 hour prior to dental work, including cleanings.   Blue-Emu Super Strength Crea Apply 1 application  topically daily as needed (arthritis).   Calcium 600+D 600-800 MG-UNIT Tabs Generic drug: Calcium Carb-Cholecalciferol Take 1 tablet by mouth every evening.   CHLORASEPTIC MT Use as directed 1 spray in the mouth or throat at bedtime.   CORTIZONE-10/ALOE EX Apply 1 application topically 2 (two) times daily as needed (itching).   Eliquis 5 MG Tabs tablet Generic drug: apixaban TAKE 1 TABLET BY MOUTH TWICE A DAY What changed: how much to take   HALLS COUGH DROPS MT Use as directed 1-2 each in the mouth or throat every Sunday.   metoprolol succinate 25 MG 24 hr tablet Commonly known as: TOPROL-XL TAKE 1 TABLET (25 MG TOTAL) BY MOUTH DAILY.   Potassium Chloride ER 20 MEQ Tbcr TAKE 2 TABLETS BY  MOUTH DAILY   PRESERVISION AREDS 2 PO Take 1 capsule by mouth 2 (two) times daily.   ONE A DAY WOMEN 50 PLUS PO Take 1 tablet by mouth every evening.   torsemide 20 MG tablet Commonly known as: DEMADEX TAKE 1 TABLET BY MOUTH TWICE A DAY        Follow-up Information     Shon Hale, MD. Schedule an  appointment as soon as possible for a visit in 1 week(s).   Specialty: Family Medicine Why: For hospital follow-up Contact information: [address] [place] [address] [phone]         Kathleene Hazel, MD. Schedule an appointment as soon as possible for a visit.   Specialty: Cardiology Why: For hospital follow-up Contact information: [address] [place] [address] [phone]                Discharge Exam: BP (!) 155/51 (BP Location: Right Arm)   Pulse (!) 58   Temp (!) 97.5 F (36.4 C) (Oral)   Resp 20   Ht 5\' 6"  (1.676 m)   Wt 127.5 kg   SpO2 96%   BMI 45.35 kg/m   General exam: Appears calm and comfortable Respiratory system: Clear to auscultation. Respiratory effort normal. Cardiovascular system: S1 & S2 heard, 2/6 systolic murmur. Gastrointestinal system: Abdomen is nondistended, soft and nontender. Normal bowel sounds heard. Central nervous system: Alert and oriented. No focal neurological deficits. Musculoskeletal: No edema. No calf tenderness Psychiatry: Judgement and insight appear normal. Mood & affect appropriate.   Condition at discharge: stable  The results of significant diagnostics from this hospitalization (including imaging, microbiology, ancillary and laboratory) are listed below for reference.   Imaging Studies: DG Chest 2 View  Result Date: 11/08/2023 CLINICAL DATA:  Shortness of breath. EXAM: CHEST - 2 VIEW COMPARISON:  Chest radiograph dated December 06, 2022. FINDINGS: The heart is enlarged. The mediastinal contours are within normal limits. TAVR. Diffuse bilateral interstitial prominence. No pleural effusion or pneumothorax. No acute osseous abnormality. IMPRESSION: Cardiomegaly with findings suggestive of interstitial pulmonary edema. Electronically Signed   By: Hart Robinsons M.D.   On: 11/08/2023 09:43    Microbiology: Results for orders placed or performed during the hospital encounter  of 11/08/23  Resp panel by RT-PCR (RSV, Flu A&B, Covid) Anterior Nasal Swab     Status: None   Collection Time: 11/08/23  8:19 AM   Specimen: Anterior Nasal Swab  Result Value Ref Range Status   SARS Coronavirus 2 by RT PCR NEGATIVE NEGATIVE Final    Comment: (NOTE) SARS-CoV-2 target nucleic acids are NOT DETECTED.  The SARS-CoV-2 RNA is generally detectable in upper respiratory specimens during the acute phase of infection. The lowest concentration of SARS-CoV-2 viral copies this assay can detect is 138 copies/mL. A negative result does not preclude SARS-Cov-2 infection and should not be used as the sole basis for treatment or other patient management decisions. A negative result may occur with  improper specimen collection/handling, submission of specimen other than nasopharyngeal swab, presence of viral mutation(s) within the areas targeted by this assay, and inadequate number of viral copies(<138 copies/mL). A negative result must be combined with clinical observations, patient history, and epidemiological information. The expected result is Negative.  Fact Sheet for Patients:  BloggerCourse.com  Fact Sheet for Healthcare Providers:  SeriousBroker.it  This test is no t yet approved or cleared by the [place] FDA and  has been authorized for detection and/or  diagnosis of SARS-CoV-2 by FDA under an Emergency Use Authorization (EUA). This EUA will remain  in effect (meaning this test can be used) for the duration of the COVID-19 declaration under Section 564(b)(1) of the Act, 21 U.S.C.section 360bbb-3(b)(1), unless the authorization is terminated  or revoked sooner.       Influenza A by PCR NEGATIVE NEGATIVE Final   Influenza B by PCR NEGATIVE NEGATIVE Final    Comment: (NOTE) The Xpert Xpress SARS-CoV-2/FLU/RSV plus assay is intended as an aid in the diagnosis of influenza from Nasopharyngeal swab specimens and should  not be used as a sole basis for treatment. Nasal washings and aspirates are unacceptable for Xpert Xpress SARS-CoV-2/FLU/RSV testing.  Fact Sheet for Patients: BloggerCourse.com  Fact Sheet for Healthcare Providers: SeriousBroker.it  This test is not yet approved or cleared by the [place] FDA and has been authorized for detection and/or diagnosis of SARS-CoV-2 by FDA under an Emergency Use Authorization (EUA). This EUA will remain in effect (meaning this test can be used) for the duration of the COVID-19 declaration under Section 564(b)(1) of the Act, 21 U.S.C. section 360bbb-3(b)(1), unless the authorization is terminated or revoked.     Resp Syncytial Virus by PCR NEGATIVE NEGATIVE Final    Comment: (NOTE) Fact Sheet for Patients: BloggerCourse.com  Fact Sheet for Healthcare Providers: SeriousBroker.it  This test is not yet approved or cleared by the [place] FDA and has been authorized for detection and/or diagnosis of SARS-CoV-2 by FDA under an Emergency Use Authorization (EUA). This EUA will remain in effect (meaning this test can be used) for the duration of the COVID-19 declaration under Section 564(b)(1) of the Act, 21 U.S.C. section 360bbb-3(b)(1), unless the authorization is terminated or revoked.  Performed at [facility], [address], [place], [place] [address]     Labs: CBC: Recent Labs  Lab 11/08/23 0814 11/09/23 0424  WBC 9.9 8.8  NEUTROABS 7.5  --   HGB 12.7 12.3  HCT 39.7 37.5  MCV 96.1 95.9  PLT 177 179   Basic Metabolic Panel: Recent Labs  Lab 11/08/23 0814 11/08/23 0958 11/09/23 0424  NA 142  --  144  K 3.2*  --  3.6  CL 107  --  107  CO2 26  --  27  GLUCOSE 169*  --  125*  BUN 22  --  19  CREATININE 1.16*  --  1.04*  CALCIUM 8.8*  --  8.8*  MG  --  2.4  --     CBG: Recent Labs  Lab  11/08/23 1246 11/08/23 1700 11/08/23 2153 11/09/23 0738 11/09/23 1132  GLUCAP 156* 114* 127* 127* 131*    Discharge time spent: 35 minutes.  Signed: Jacquelin Hawking, MD [facility] 11/09/2023

## 2023-11-09 NOTE — Care Management Obs Status (Signed)
MEDICARE OBSERVATION STATUS NOTIFICATION   Patient Details  Name: [name] [name] MRN: [id number] Date of Birth: 1940-12-29   Medicare Observation Status Notification Given:  Yes    Larrie Kass, LCSW 11/09/2023, 12:30 PM

## 2023-11-09 NOTE — Progress Notes (Signed)
Mobility Specialist - Progress Note  Pre-mobility: 58 bpm HR, 95% SpO2 During mobility: 88 bpm HR, 91% SpO2 Post-mobility: 63 bpm HR, 94% SPO2   11/09/23 1058  Mobility  Activity Ambulated with assistance in hallway  Level of Assistance Standby assist, set-up cues, supervision of patient - no hands on  Assistive Device Other (Comment) (hallway rails)  Distance Ambulated (ft) 100 ft  Range of Motion/Exercises Active  Activity Response Tolerated fair  Mobility Referral Yes  $Mobility charge 1 Mobility  Mobility Specialist Start Time (ACUTE ONLY) 1050  Mobility Specialist Stop Time (ACUTE ONLY) 1058  Mobility Specialist Time Calculation (min) (ACUTE ONLY) 8 min   Pt was found in bed and agreeable to ambulate. Pt had no complaints during session. Pt daughter voiced concern about pt still unable to be approved for knee surgery due to breathing and cardiac issues. At EOS returned to sit EOB with all needs met. Call bell in reach and family in room.  Billey Chang Mobility Specialist

## 2023-11-09 NOTE — Progress Notes (Signed)
02 sat- 95% at rest, 91% with ambulation.

## 2023-11-09 NOTE — Hospital Course (Signed)
[name] [name] is a 82 y.o. female with a history of chronic diastolic heart failure, diabetes mellitus type 2, persistent atrial fibrillation, GERD, CKD stage III, severe aortic stenosis s/p TAVR.  Patient presented secondary to shortness of breath and was found to have evidence of acute on chronic diastolic heart failure. Symptoms improved significantly with Lasix IV diuresis.

## 2023-11-23 NOTE — Progress Notes (Unsigned)
[facility]   MULTIDISCIPLINARY HEART VALVE TEAM  Structural Heart Office Note:  .    Date:  11/26/2023  ID:  [name] [name], DOB 1941-03-24, MRN [id number] PCP: Patient, No Pcp Per  [facility] Providers Cardiologist: Dr. Clifton James, MD   History of Present Illness: .   [name] [name] is a 82 y.o. female with a hx of morbid obesity, arthritis, HTN, DM2 diet controlled, paroxysmal A-fib on eliquis, mild CAD, GERD, CKD III, chronic LE wounds, and severe low flow, low gradient AS s/p TAVR 11/2022 who is here today for one year follow up.    Ms. Geffert was initially found to have moderate AS and was followed for some time with surveillance echos. Echo on 03/29/22 showed progression to severe AS with mean gradient of 34 mmHg and AVA 0.7 although she remained asymptomatic and continued surveillance was recommended. By 09/27/22 she had complaints of progressive dyspnea on exertion and fatigue. Repeat echo showed severe calcified AS with a mean gradient of 29.0 mmHg, a peak gradient of 47.3 mmHg, and valve area by VTI 0.92.  She was then evaluated by the multidisciplinary valve team and is now s/p TAVR 11/28/22 with a 29 mm Medtronic Evolut Fx THV via the TF approach. Post operative echo with mild peri-valvular regurgitation with a mean gradient 11 mmHg and AVA at 1.47cm2. She developed new post op LBBB although no high grade AV block progression on ZIO. She was discharged 11/29/22 however was re-admitted 12/06/22-12/08/22 with acute CHF and treated with IV diuresis with improved symptoms.    She has done quite well since TAVR although was recently admitted before Thanksgiving with acute CHF. She was treated with IV diuretics and discharged with a weight at 262lb. No medication adjustments were made. She does not weigh herself daily and also admits to eating high sodium foods such as Cheetos and pretzels. We discussed the importance of daily weighing and monitoring salt intake to help prevent  further hospitalizations. Otherwise, she denies chest pain, SOB, palpitations, LE edema, orthopnea, PND, dizziness, or syncope. Denies bleeding in stool or urine.     Physical Exam:   VS:  BP (!) 142/62   Pulse 68   Ht 5\' 6"  (1.676 m)   Wt 260 lb (117.9 kg)   SpO2 94%   BMI 41.97 kg/m    Wt Readings from Last 3 Encounters:  11/26/23 260 lb (117.9 kg)  11/09/23 262 lb 1.6 oz (118.9 kg)  01/08/23 271 lb 9.6 oz (123.2 kg)    General: Well developed, well nourished, NAD Lungs:Clear to ausculation bilaterally. No wheezes, rales, or rhonchi. Breathing is unlabored. Cardiovascular: RRR with S1 S2. Soft murmur Extremities: No edema.  Neuro: Alert and oriented. No focal deficits. No facial asymmetry. MAE spontaneously. Psych: Responds to questions appropriately with normal affect.     ASSESSMENT AND PLAN: .    Severe AS: Patient doing well with NYHA class I symptoms s/p successful TAVR with a 29 mm Medtronic Evolut Fx THV via the TF approach on 11/28/22. Echo today with normal LVEF at 55% with mild to moderate MR and mild to moderate MS and MAC. 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 Plan to discuss at team meeting for plan. Continue Eliquis monotherapy and lifelong dental SBE with Amoxicillin. Plan follow up with primary cardiologist 02/2024.   HFpEF: Recent readmission 10/2023 for acute CHF treated with  IV lasix. Long discussion about the importance of daily weights and refraining from high sodium processed foods. Weight very good today at 260lb (in office) which appears to be her goal weight. Continue current regimen with torsemide 20mg  daily and K+ supplementation. Appears euvolemic today on exam.    Hx of LBBB: Developed post TAVR LBBB however ZIO showed no high grade heart block with evidence of SR, 1st degree AV block, LBBB/IVCD, and AF with 34% burden.    HTN: Slightly  elevated today. Reports home BPs in the 130's No changes needed today.    Persistent afib: Rate controlled. Continue Eliquis and amiodarone. Follows with EP.    CKD stage IIIb: Creat remains in the ~1.2 range   Pulmonary nodules: Pre TAVR CT showed small bilateral solid pulmonary nodules, largest measuring 3 mm. No follow-up needed if patient is low-risk (and has no known or suspected primary neoplasm). Patient considered low risk with no malignant history or history of tobacco use.   I spent 20 minutes caring for this patient today including face-to-face discussions, ordering and reviewing labs, reviewing records from [facility] and other outside facilities, documenting in the record, and arranging for follow up.   Signed, Georgie Chard, NP

## 2023-11-26 ENCOUNTER — Other Ambulatory Visit (HOSPITAL_COMMUNITY): Payer: Self-pay | Admitting: Cardiology

## 2023-11-26 ENCOUNTER — Ambulatory Visit (HOSPITAL_COMMUNITY): Payer: Medicare Other | Attending: Cardiology

## 2023-11-26 ENCOUNTER — Ambulatory Visit: Payer: Medicare Other | Admitting: Cardiology

## 2023-11-26 VITALS — BP 142/62 | HR 68 | Ht 66.0 in | Wt 260.0 lb

## 2023-11-26 DIAGNOSIS — I447 Left bundle-branch block, unspecified: Secondary | ICD-10-CM | POA: Insufficient documentation

## 2023-11-26 DIAGNOSIS — T8203XD Leakage of heart valve prosthesis, subsequent encounter: Secondary | ICD-10-CM | POA: Diagnosis not present

## 2023-11-26 DIAGNOSIS — I10 Essential (primary) hypertension: Secondary | ICD-10-CM

## 2023-11-26 DIAGNOSIS — R911 Solitary pulmonary nodule: Secondary | ICD-10-CM

## 2023-11-26 DIAGNOSIS — Z952 Presence of prosthetic heart valve: Secondary | ICD-10-CM | POA: Insufficient documentation

## 2023-11-26 DIAGNOSIS — I4819 Other persistent atrial fibrillation: Secondary | ICD-10-CM | POA: Diagnosis not present

## 2023-11-26 DIAGNOSIS — I350 Nonrheumatic aortic (valve) stenosis: Secondary | ICD-10-CM | POA: Insufficient documentation

## 2023-11-26 DIAGNOSIS — T8203XA Leakage of heart valve prosthesis, initial encounter: Secondary | ICD-10-CM | POA: Insufficient documentation

## 2023-11-26 LAB — ECHOCARDIOGRAM COMPLETE
AR max vel: 1.66 cm2
AV Area VTI: 1.68 cm2
AV Area mean vel: 1.55 cm2
AV Mean grad: 16 mm[Hg]
AV Peak grad: 29.4 mm[Hg]
Ao pk vel: 2.71 m/s
Area-P 1/2: 2.39 cm2
Est EF: 55
MV VTI: 1.84 cm2
P 1/2 time: 537 ms
S' Lateral: 3.7 cm

## 2023-11-26 MED ORDER — POTASSIUM CHLORIDE ER 20 MEQ PO TBCR: 2.0000 | EXTENDED_RELEASE_TABLET | Freq: Every day | ORAL | 1 refills | Status: DC

## 2023-11-26 NOTE — Patient Instructions (Addendum)
Medication Instructions:  Your physician recommends that you continue on your current medications as directed. Please refer to the Current Medication list given to you today. *If you need a refill on your cardiac medications before your next appointment, please call your pharmacy*   Lab Work: None ordered   Testing/Procedures: None ordered   Follow-Up: At [facility], you and your health needs are our priority.  As part of our continuing mission to provide you with exceptional heart care, we have created designated Provider Care Teams.  These Care Teams include your primary Cardiologist (physician) and Advanced Practice Providers (APPs -  Physician Assistants and Nurse Practitioners) who all work together to provide you with the care you need, when you need it.  We recommend signing up for the patient portal called "MyChart".  Sign up information is provided on this After Visit Summary.  MyChart is used to connect with patients for Virtual Visits (Telemedicine).  Patients are able to view lab/test results, encounter notes, upcoming appointments, etc.  Non-urgent messages can be sent to your provider as well.   To learn more about what you can do with MyChart, go to [email or web address].    Your next appointment:   3 month(s)  Provider:   Verne Carrow, MD     Other Instructions

## 2023-11-27 ENCOUNTER — Other Ambulatory Visit: Payer: Self-pay | Admitting: Physician Assistant

## 2023-11-27 ENCOUNTER — Encounter: Payer: Self-pay | Admitting: Physician Assistant

## 2023-11-27 ENCOUNTER — Other Ambulatory Visit (HOSPITAL_COMMUNITY): Payer: Medicare Other

## 2023-11-27 DIAGNOSIS — Z952 Presence of prosthetic heart valve: Secondary | ICD-10-CM

## 2023-11-27 DIAGNOSIS — T8203XA Leakage of heart valve prosthesis, initial encounter: Secondary | ICD-10-CM

## 2023-12-01 ENCOUNTER — Other Ambulatory Visit: Payer: Self-pay | Admitting: Cardiology

## 2023-12-06 ENCOUNTER — Ambulatory Visit (HOSPITAL_COMMUNITY)
Admission: RE | Admit: 2023-12-06 | Discharge: 2023-12-06 | Disposition: A | Discharge status: Home / Self Care | Payer: Medicare Other | Source: Ambulatory Visit | Attending: Physician Assistant | Admitting: Physician Assistant

## 2023-12-06 DIAGNOSIS — T8203XA Leakage of heart valve prosthesis, initial encounter: Secondary | ICD-10-CM

## 2023-12-06 DIAGNOSIS — Z952 Presence of prosthetic heart valve: Secondary | ICD-10-CM

## 2023-12-06 MED ORDER — NITROGLYCERIN 0.4 MG SL SUBL
SUBLINGUAL_TABLET | SUBLINGUAL | Status: AC
  Filled 2023-12-06: qty 2

## 2023-12-06 MED ORDER — NITROGLYCERIN 0.4 MG SL SUBL: 0.8000 mg | SUBLINGUAL_TABLET | Freq: Once | SUBLINGUAL | Status: DC

## 2023-12-06 MED ORDER — IOHEXOL 350 MG/ML SOLN
95.0000 mL | Freq: Once | INTRAVENOUS | Status: AC | PRN
  Administered 2023-12-06: 95 mL via INTRAVENOUS

## 2023-12-28 ENCOUNTER — Telehealth: Payer: Self-pay | Admitting: Cardiology

## 2023-12-28 NOTE — Telephone Encounter (Signed)
 Patient states that she is needing updated information in regards to leak of prosthetic heart valve.

## 2023-12-28 NOTE — Telephone Encounter (Signed)
 I called the patient and updated her with the team discussion:  The team reviewed her imaging and recommended continued observation at this time and manage heart failure based on symptoms.  They felt like trying to fix PVL may cause additional problems due to bulky calcium  and with it being a MDT valve the case would be difficult. Dr Verlin plans to see her at her regular follow up already scheduled in March.   She was thankful for the callback and I apologized for the delay

## 2024-01-10 ENCOUNTER — Other Ambulatory Visit: Payer: Self-pay | Admitting: Interventional Radiology

## 2024-01-10 DIAGNOSIS — C642 Malignant neoplasm of left kidney, except renal pelvis: Secondary | ICD-10-CM

## 2024-01-18 ENCOUNTER — Ambulatory Visit (HOSPITAL_COMMUNITY)
Admission: RE | Admit: 2024-01-18 | Discharge: 2024-01-18 | Disposition: A | Discharge status: Home / Self Care | Payer: Medicare Other | Source: Ambulatory Visit | Attending: Interventional Radiology | Admitting: Interventional Radiology

## 2024-01-18 DIAGNOSIS — I722 Aneurysm of renal artery: Secondary | ICD-10-CM | POA: Diagnosis not present

## 2024-01-18 DIAGNOSIS — C642 Malignant neoplasm of left kidney, except renal pelvis: Secondary | ICD-10-CM | POA: Diagnosis not present

## 2024-01-18 DIAGNOSIS — Z85528 Personal history of other malignant neoplasm of kidney: Secondary | ICD-10-CM | POA: Diagnosis not present

## 2024-01-18 DIAGNOSIS — N200 Calculus of kidney: Secondary | ICD-10-CM | POA: Diagnosis not present

## 2024-01-18 MED ORDER — IOHEXOL 300 MG/ML  SOLN
80.0000 mL | Freq: Once | INTRAMUSCULAR | Status: AC | PRN
  Administered 2024-01-18: 80 mL via INTRAVENOUS

## 2024-01-19 IMAGING — CT CT ABDOMEN WO/W CM
3 of 12 series · 10 of 46 positions shown, 16 images · IV contrast (OMNIPAQUE)
Comparison: Multiple prior imaging studies. The most recent CT scan
is 07/01/2021

CLINICAL DATA: History of microwave ablation of left renal cell
carcinoma 11/17/2020.

EXAM:
CT ABDOMEN WITHOUT AND WITH CONTRAST
TECHNIQUE: Multidetector CT imaging of the abdomen was performed following the
standard protocol before and following the bolus administration of
intravenous contrast.

[Series 2: axial pre · axial · non-contrast · 0.98mm/px · z∈[-274,-184]mm · 3 of 76 slices shown]
[im 16/76  soft-tissue]
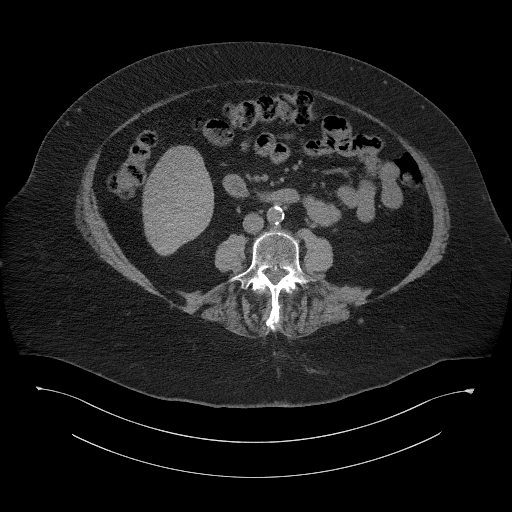
[im 31/76  soft-tissue]
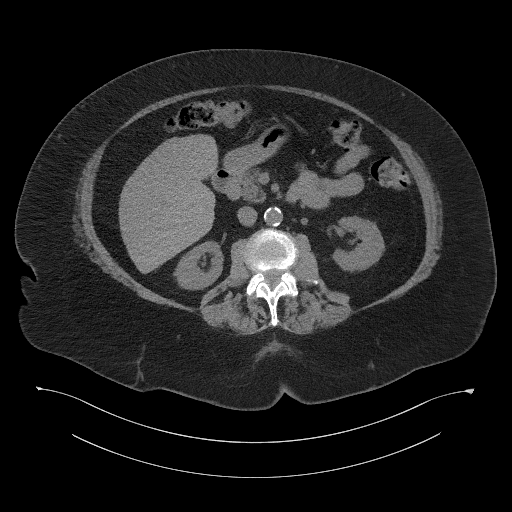
[im 46/76  soft-tissue]
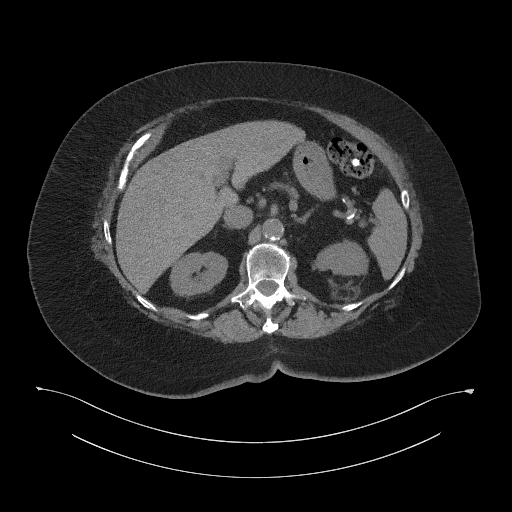

[Series 3: coronal pre · coronal · non-contrast · 0.45mm/px · 2 of 111 slices shown, 3 images]
[im 37/111  soft-tissue]
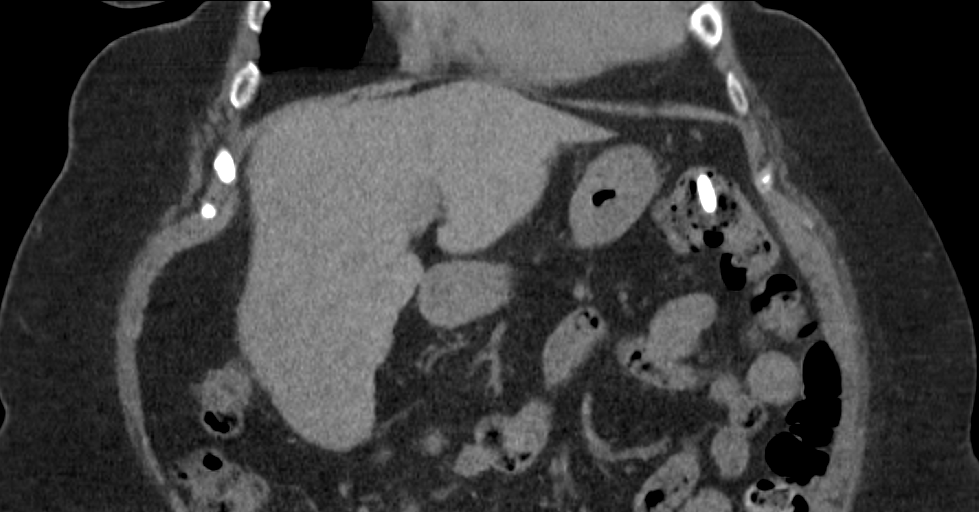
[im 37/111  bone]
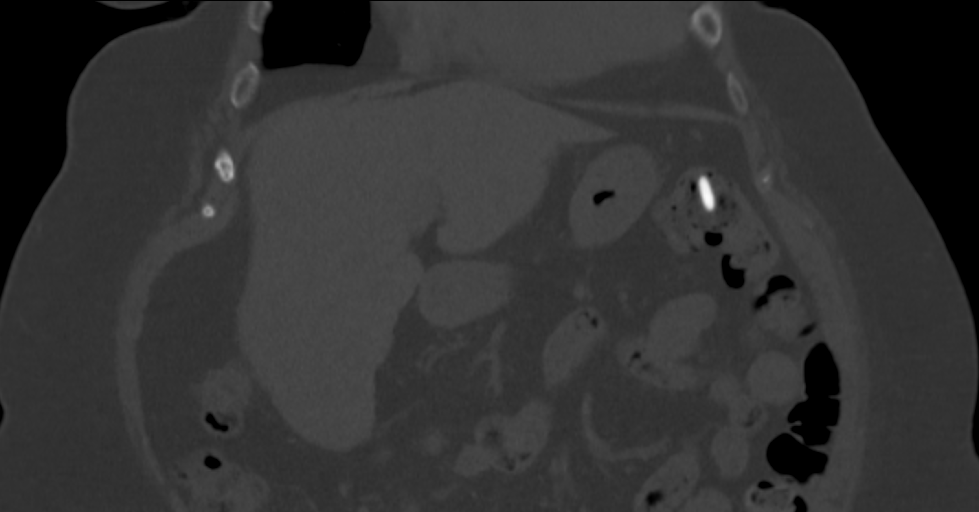
[im 74/111  soft-tissue]
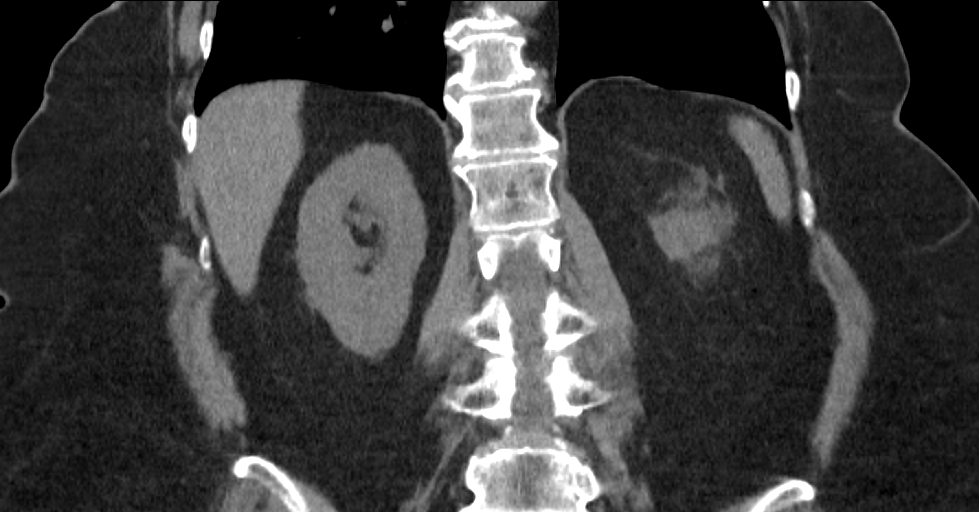

[Series 11: axial venous · axial · portal-venous · 0.98mm/px · z∈[-279,-135]mm · 5 of 74 slices shown, 10 images]
[im 13/74  soft-tissue]
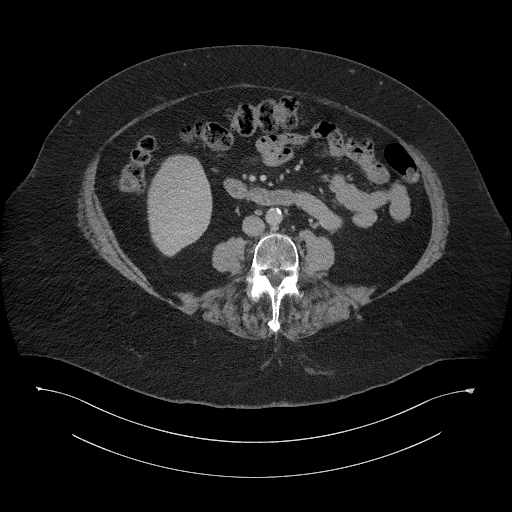
[im 13/74  bone]
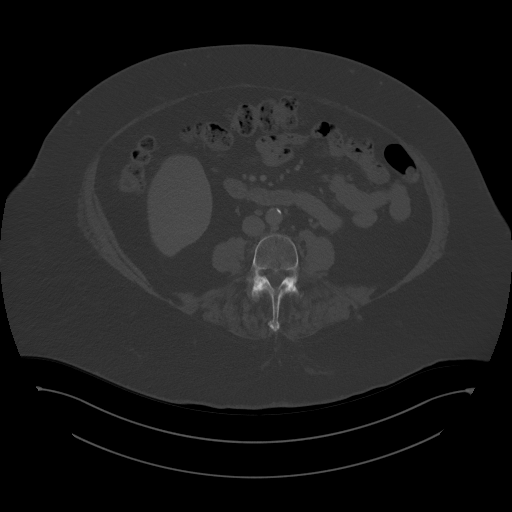
[im 25/74  soft-tissue]
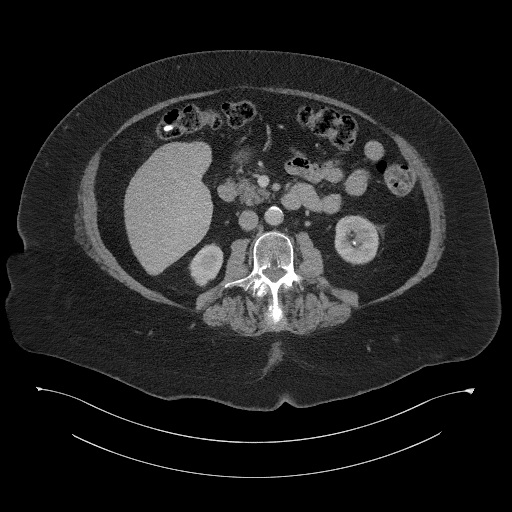
[im 25/74  lung]
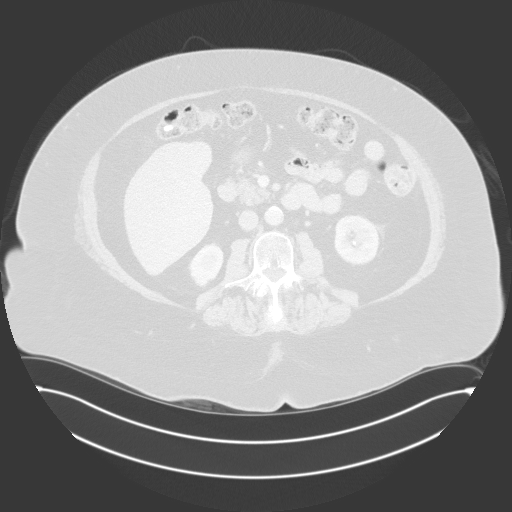
[im 37/74  soft-tissue]
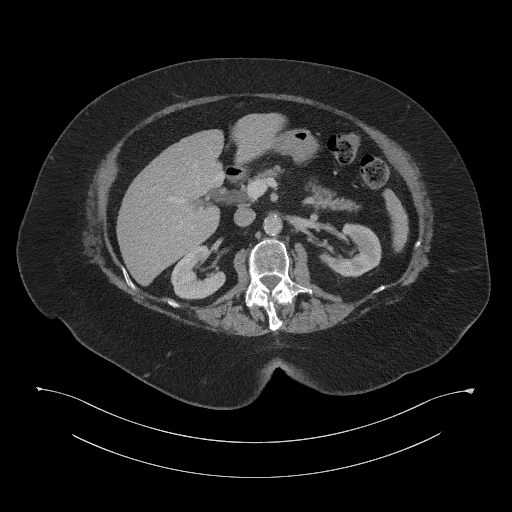
[im 37/74  lung]
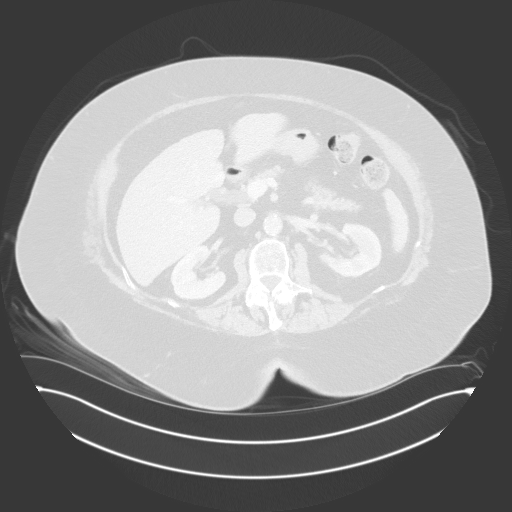
[im 49/74  soft-tissue]
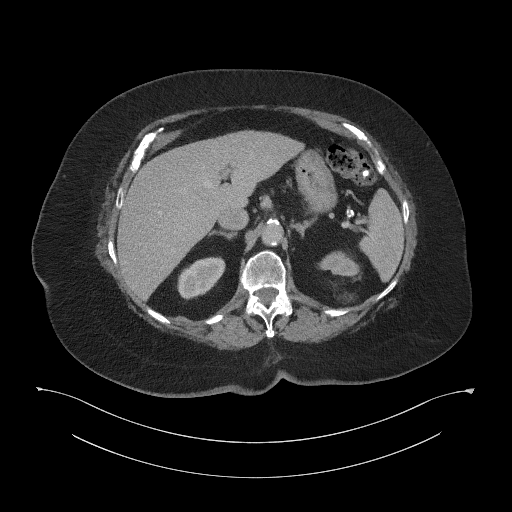
[im 49/74  lung]
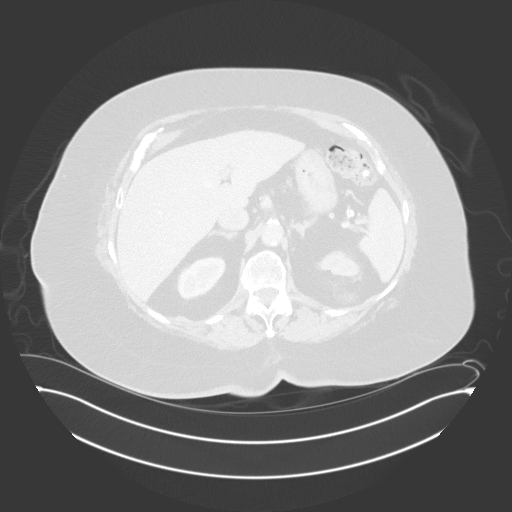
[im 61/74  soft-tissue]
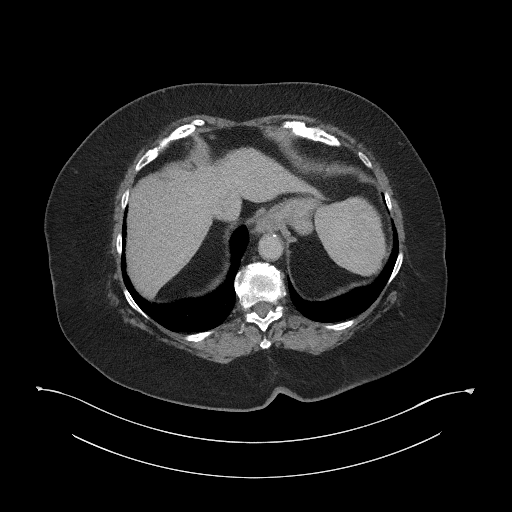
[im 61/74  lung]
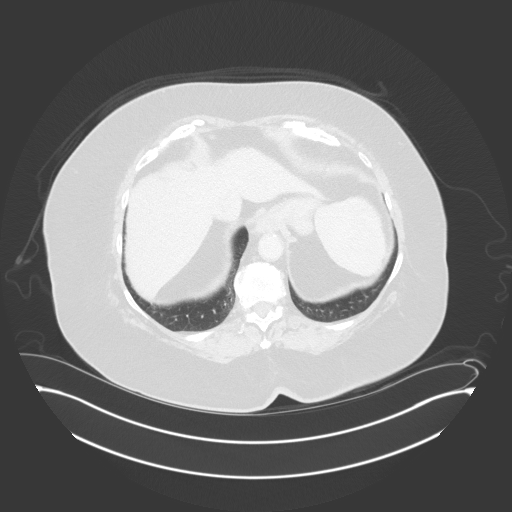

[10 of 46 positions shown; findings below may reference images not displayed]

RADIATION DOSE REDUCTION: This exam was performed according to the
departmental dose-optimization program which includes automated
exposure control, adjustment of the mA and/or kV according to
patient size and/or use of iterative reconstruction technique.

CONTRAST:  75mL OMNIPAQUE IOHEXOL 300 MG/ML  SOLN
FINDINGS: Lower chest: The lung bases are clear of acute process. No pleural
effusion or pulmonary lesions. The heart is normal in size. No
pericardial effusion. The distal esophagus and aorta are
unremarkable.

Hepatobiliary: No hepatic lesions or intrahepatic biliary
dilatation. The liver contour is irregular but appears stable. Could
not exclude cirrhosis. No intrahepatic biliary dilatation. The
gallbladder is surgically absent. No common bile duct dilatation.

Pancreas: No mass, inflammation or ductal dilatation.

Spleen: Normal size.  No focal lesions.

Adrenals/Urinary Tract: The adrenal glands are normal.

Small bilateral renal calculi are noted. Stable post ablation
changes involving the upper pole region of the left kidney. The
ablated renal lesion is stable at 22 x 21 mm. No evidence of
residual or recurrent enhancing tumor. No new renal lesions. No
collecting system abnormalities.

Stomach/Bowel: The stomach, duodenum, visualized small bowel and
visualized colon are grossly normal.

Vascular/Lymphatic: Moderate atherosclerotic calcification involving
the abdominal aorta and scattered branch vessel calcifications. No
aneurysm or dissection. There is a stable 12 mm right renal artery
aneurysm.

Other: No ascites or abdominal wall hernia.

Musculoskeletal: No significant bony findings.
IMPRESSION: 1. Stable post ablation changes involving the upper pole region of
the left kidney. No evidence of residual or recurrent enhancing
tumor.
2. Small bilateral renal calculi.
3. Stable 12 mm right renal artery aneurysm.

Aortic Atherosclerosis ([id number]).

## 2024-01-23 NOTE — Progress Notes (Addendum)
 Referring Physician(s): Valli Shank, MD   Chief Complaint: The patient is seen in virtual telephone follow up today s/p left renal mass ablation 11/17/20.   History of present illness: [name] Fetty. 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 Her 3 month follow up CT showed excellent treatment response but there was a small, linear urinoma within the ablated mass. Subsequent imaging at 6, 12 and 24 months showed no evidence of recurrent or residual tumor and resolution of the previously visualized urinoma. At our last visit 01/15/23 she reported doing well and was without back/flank pain, hematuria, dysuria or other concerning symptoms. We discussed repeat surveillance imaging in one year. She presents today for follow up via virtual tele-health visit. She continues to do well and remains asymptomatic.  Past Medical History:  Diagnosis Date   Anemia    hx of   Arthritis    knee and shoulders   Atrial fibrillation (HCC)    Carpal tunnel syndrome on right    Coronary artery disease 05/2020   Minimal, nobstructive CAD. Tortuous cororany arteries.   Diabetes mellitus without complication (HCC)    diet controlled no meds in 5-6 yrs   Dyspnea    on exertion    Dysrhythmia 05/2020   afib   GERD (gastroesophageal reflux disease)    No longer taking medications   Hematuria    History of hypothyroidism 30 yrs ago   History of kidney stones    Hypertension    Lower extremity edema    chronic lower extremity edema    Myocardial infarction [facility])    patient mention questionable MI   Renal mass    S/P TAVR (transcatheter aortic valve replacement) 11/28/2022   s/p TAVR with a 29 mm Medtronic Evolut Fx via the TF approach by Dr. Verlin & Dr. Murriel.    Past Surgical  History:  Procedure Laterality Date   ABDOMINAL HYSTERECTOMY  1992   complete   BILATERAL CARPAL TUNNEL RELEASE  yrs ago   CARDIOVERSION N/A 09/23/2020   Procedure: CARDIOVERSION;  Surgeon: Hobart Powell BRAVO, MD;  Location[facility] ENDOSCOPY;  Service: Cardiovascular;  Laterality: N/A;   CARDIOVERSION N/A 03/21/2021   Procedure: CARDIOVERSION;  Surgeon: Santo Stanly LABOR, MD;  Location: [facility] ENDOSCOPY;  Service: Cardiovascular;  Laterality: N/A;   CHOLECYSTECTOMY  1996   CYSTOSCOPY/URETEROSCOPY/HOLMIUM LASER/STENT PLACEMENT Bilateral 08/19/2020   Procedure: CYSTOSCOPY BILATERAL  RETROGRADE  URETEROSCOPY/HOLMIUM LASER/STENT PLACEMENT;  Surgeon: Watt Rush, MD;  Location[facility];  Service: Urology;  Laterality: Bilateral;   HAMMER TOE SURGERY Right yrs ago   INTRAOPERATIVE TRANSTHORACIC ECHOCARDIOGRAM N/A 11/28/2022   Procedure: INTRAOPERATIVE TRANSTHORACIC ECHOCARDIOGRAM;  Surgeon: Verlin Lonni BIRCH, MD;  Location: [facility] OR;  Service: Open Heart Surgery;  Laterality: N/A;   IR RADIOLOGIST EVAL & MGMT  10/26/2020   IR RADIOLOGIST EVAL & MGMT  01/06/2021   IR RADIOLOGIST EVAL & MGMT  03/22/2021   IR RADIOLOGIST EVAL & MGMT  07/06/2021   IR RADIOLOGIST EVAL & MGMT  01/11/2022   IR RADIOLOGIST EVAL & MGMT  01/15/2023   LEFT HEART CATH AND CORONARY ANGIOGRAPHY N/A 06/02/2020   Procedure: LEFT HEART CATH AND CORONARY ANGIOGRAPHY;  Surgeon: Dann Candyce RAMAN, MD;  Location: [facility] INVASIVE CV LAB;  Service: Cardiovascular;  Laterality: N/A;   RADIOLOGY WITH ANESTHESIA N/A 11/17/2020  Procedure: RADIOLOGY WITH ANESTHESIA RENAL MICROWAVE ABLATION;  Surgeon: Jennefer Ester PARAS, MD;  Location: [facility] ORS;  Service: Radiology;  Laterality: N/A;   TONSILLECTOMY  age 89   TRANSCATHETER AORTIC VALVE REPLACEMENT, TRANSFEMORAL N/A 11/28/2022   Procedure: Transcatheter Aortic Valve Replacement, Transfemoral using a Medtronic 29 mm EVOLUT FX Aortic Valve;  Surgeon: Verlin Lonni BIRCH, MD;  Location:  [facility] OR;  Service: Open Heart Surgery;  Laterality: N/A;    Allergies: Tetanus-diphtheria toxoids td and Cardizem  [diltiazem ]  Medications: Prior to Admission medications   Medication Sig Start Date End Date Taking? Authorizing Provider  acetaminophen  (TYLENOL ) 500 MG tablet Take 1,000 mg by mouth daily as needed for moderate pain or headache.    [provider]  amiodarone  (PACERONE ) 200 MG tablet TAKE 1 TABLET BY MOUTH EVERY DAY 12/03/23   McDaniel, Jill D, NP  amoxicillin  (AMOXIL ) 500 MG capsule Take 4 capsules (2,000 mg total) by mouth as directed. Take 4 tablets 1 hour prior to dental work, including cleanings. 01/08/23   Arvil Poe D, NP  Calcium  Carb-Cholecalciferol (CALCIUM  600+D) 600-800 MG-UNIT TABS Take 1 tablet by mouth every evening.     [provider]  ELIQUIS  5 MG TABS tablet TAKE 1 TABLET BY MOUTH TWICE A DAY Patient taking differently: Take 5 mg by mouth 2 (two) times daily. 08/03/23   Verlin Lonni BIRCH, MD  Hydrocortisone -Aloe Vera (CORTIZONE-10/ALOE EX) Apply 1 application topically 2 (two) times daily as needed (itching).    [provider]  Liniments (BLUE-EMU SUPER STRENGTH) CREA Apply 1 application  topically daily as needed (arthritis).    [provider]  Menthol  (HALLS COUGH DROPS MT) Use as directed 1-2 each in the mouth or throat every Sunday.    [provider]  metoprolol  succinate (TOPROL -XL) 25 MG 24 hr tablet TAKE 1 TABLET (25 MG TOTAL) BY MOUTH DAILY. 03/02/23 03/01/24  Acharya, Gayatri A, MD  Multiple Vitamins-Minerals (ONE A DAY WOMEN 50 PLUS PO) Take 1 tablet by mouth every evening.    [provider]  Multiple Vitamins-Minerals (PRESERVISION AREDS 2 PO) Take 1 capsule by mouth 2 (two) times daily.     [provider]  Phenol [facility] MT) Use as directed 1 spray in the mouth or throat at bedtime.    [provider]  Potassium Chloride  ER 20 MEQ TBCR Take 2 tablets (40 mEq  total) by mouth daily. 11/26/23   Arvil Poe BIRCH, NP  torsemide  (DEMADEX ) 20 MG tablet TAKE 1 TABLET BY MOUTH TWICE A DAY 07/19/23   Verlin Lonni BIRCH, MD     Family History  Problem Relation Age of Onset   Hypertension Mother    Hypertension Father    Diabetes Paternal Grandmother     Social History   Socioeconomic History   Marital status: Married    Spouse name: Not on file   Number of children: 5   Years of education: Not on file   Highest education level: Not on file  Occupational History   Occupation: Retired-pastor  Tobacco Use   Smoking status: Never   Smokeless tobacco: Never  Vaping Use   Vaping status: Never Used  Substance and Sexual Activity   Alcohol use: Not Currently   Drug use: Never   Sexual activity: Not Currently    Birth control/protection: Surgical  Other Topics Concern   Not on file  Social History Narrative   Not on file   Social Drivers of Health   Financial Resource Strain: Not on  file  Food Insecurity: No Food Insecurity (11/08/2023)   Hunger Vital Sign    Worried About Running Out of Food in the Last Year: Never true    Ran Out of Food in the Last Year: Never true  Transportation Needs: No Transportation Needs (11/08/2023)   PRAPARE - Administrator, Civil Service (Medical): No    Lack of Transportation (Non-Medical): No  Physical Activity: Not on file  Stress: Not on file  Social Connections: Not on file     Vital Signs: There were no vitals taken for this visit.  No physical exam was performed in lieu of virtual telephone visit.    Imaging: CT AP 08/05/20    CT AP 01/10/23  Unchanged post ablation appearance of the posterior superior pole of the left kidney, without evidence of associated contrast enhancement.  CT AP 01/18/24  Continued decreased size and post-ablative changes of left renal mass, no enhancement.  Labs:  CBC: Recent Labs    11/08/23 0814 11/09/23 0424  WBC 9.9 8.8  HGB 12.7 12.3   HCT 39.7 37.5  PLT 177 179    COAGS: No results for input(s): INR, APTT in the last 8760 hours.  BMP: Recent Labs    11/08/23 0814 11/09/23 0424  NA 142 144  K 3.2* 3.6  CL 107 107  CO2 26 27  GLUCOSE 169* 125*  BUN 22 19  CALCIUM  8.8* 8.8*  CREATININE 1.16* 1.04*  GFRNONAA 48* 54*    LIVER FUNCTION TESTS: No results for input(s): BILITOT, AST, ALT, ALKPHOS, PROT, ALBUMIN in the last 8760 hours.  Assessment and Plan: 83 year old female with a history of left renal tumor s/p microwave ablation and biopsy 11/17/20 confirming clear cell renal cell carcinoma. 3 month follow up CT showed excellent treatment response but complicated by small, linear urinoma within the ablated mass.  6, 12, 24, and 36 month CTs show no evidence of recurrence or residual tumor, with resolution of the previously visualized small linear urinoma.   Follow up in 1 year with CT abdomen pelvis (renal mass protocol) and clinic visit shortly thereafter.  Ester Sides, MD Pager: [phone]    I spent a total of 25 Minutes in virtual telephone clinical consultation, greater than 50% of which was counseling/coordinating care for renal cell carcinoma.

## 2024-01-25 ENCOUNTER — Ambulatory Visit
Admission: RE | Admit: 2024-01-25 | Discharge: 2024-01-25 | Disposition: A | Discharge status: Home / Self Care | Payer: Medicare Other | Source: Ambulatory Visit | Attending: Interventional Radiology

## 2024-01-25 DIAGNOSIS — C642 Malignant neoplasm of left kidney, except renal pelvis: Secondary | ICD-10-CM | POA: Diagnosis not present

## 2024-01-25 HISTORY — PX: IR RADIOLOGIST EVAL & MGMT: IMG5224

## 2024-03-03 ENCOUNTER — Ambulatory Visit: Payer: Medicare Other | Admitting: Cardiovascular Disease

## 2024-03-03 NOTE — Progress Notes (Deleted)
 Structural Heart Clinic Note  No chief complaint on file.  History of Present Illness: 83 yo female with history of arthritis, HTN, diet controlled diabetes, persistent atrial fibrillation, CAD, GERD and severe aortic stenosis s/p TAVR who is here today for follow up. I saw her as a new consult for discussion regarding her aortic stenosis in April 2022. She was referred by Dr. Jacques Navy. She has been followed in the atrial fibrillation clinic for paroxysmal atrial fibrillation. She has been followed in the wound clinic in [place] for chronic LE wounds. She has had an ablation for renal cell carcinoma.  She has been on amiodarone and Eliquis. Cardiac cath June 2021 with mild non-obstructive CAD. She underwent TAVR in December 2023 with placement of a 29 mm Medtronic Evolut FX THV via the transfemoral approach. She did well following her TAVR. Echo December 2024 with LVEF=55%. 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 Cardiac CT 11/1923 with PVL with bulky calcification of the native RCC leaflet. Mild hypo-attenuating leaflet thickening of all 3 leaflets with no restricted leaflet motion. The images were reviewed in our structural team meeting and we all agreed that balloon expansion of the bioprosthetic valve would not be a good option given the bulky calcification of the RCC leaflet which is the reason for her PVL.   She is here today for follow up. The patient denies any chest pain, dyspnea, palpitations, lower extremity edema, orthopnea, PND, dizziness, near syncope or syncope.   Primary Care Physician: Patient, No Pcp Per Primary Cardiologist: Jacques Navy  Past Medical History:  Diagnosis Date   Anemia    hx of   Arthritis    knee and shoulders   Atrial fibrillation (HCC)    Carpal tunnel syndrome on right    Coronary artery disease 05/2020   Minimal, nobstructive CAD. Tortuous cororany arteries.   Diabetes mellitus without complication (HCC)    diet controlled no meds in 5-6 yrs    Dyspnea    on exertion    Dysrhythmia 05/2020   afib   GERD (gastroesophageal reflux disease)    No longer taking medications   Hematuria    History of hypothyroidism 30 yrs ago   History of kidney stones    Hypertension    Lower extremity edema    chronic lower extremity edema    Myocardial infarction [facility])    patient mention questionable MI   Renal mass    S/P TAVR (transcatheter aortic valve replacement) 11/28/2022   s/p TAVR with a 29 mm Medtronic Evolut Fx via the TF approach by Dr. Clifton James & Dr. Delia Chimes.    Past Surgical History:  Procedure Laterality Date   ABDOMINAL HYSTERECTOMY  1992   complete   BILATERAL CARPAL TUNNEL RELEASE  yrs ago   CARDIOVERSION N/A 09/23/2020   Procedure: CARDIOVERSION;  Surgeon: Meriam Sprague, MD;  Location: [facility] ENDOSCOPY;  Service: Cardiovascular;  Laterality: N/A;   CARDIOVERSION N/A 03/21/2021   Procedure: CARDIOVERSION;  Surgeon: Christell Constant, MD;  Location: [facility] ENDOSCOPY;  Service: Cardiovascular;  Laterality: N/A;   CHOLECYSTECTOMY  1996   CYSTOSCOPY/URETEROSCOPY/HOLMIUM LASER/STENT PLACEMENT Bilateral 08/19/2020   Procedure: CYSTOSCOPY BILATERAL  RETROGRADE  URETEROSCOPY/HOLMIUM LASER/STENT PLACEMENT;  Surgeon: Bjorn Pippin, MD;  Location: [facility];  Service: Urology;  Laterality: Bilateral;   HAMMER TOE SURGERY Right yrs ago   INTRAOPERATIVE TRANSTHORACIC ECHOCARDIOGRAM N/A 11/28/2022   Procedure: INTRAOPERATIVE TRANSTHORACIC ECHOCARDIOGRAM;  Surgeon: Kathleene Hazel, MD;  Location: [facility] OR;  Service: Open  Heart Surgery;  Laterality: N/A;   IR RADIOLOGIST EVAL & MGMT  10/26/2020   IR RADIOLOGIST EVAL & MGMT  01/06/2021   IR RADIOLOGIST EVAL & MGMT  03/22/2021   IR RADIOLOGIST EVAL & MGMT  07/06/2021   IR RADIOLOGIST EVAL & MGMT  01/11/2022   IR RADIOLOGIST EVAL & MGMT  01/15/2023   IR RADIOLOGIST EVAL & MGMT  01/25/2024   LEFT HEART CATH AND CORONARY ANGIOGRAPHY N/A 06/02/2020   Procedure: LEFT HEART CATH  AND CORONARY ANGIOGRAPHY;  Surgeon: Corky Crafts, MD;  Location: [facility] INVASIVE CV LAB;  Service: Cardiovascular;  Laterality: N/A;   RADIOLOGY WITH ANESTHESIA N/A 11/17/2020   Procedure: RADIOLOGY WITH ANESTHESIA RENAL MICROWAVE ABLATION;  Surgeon: Bennie Dallas, MD;  Location: [facility] ORS;  Service: Radiology;  Laterality: N/A;   TONSILLECTOMY  age 47   TRANSCATHETER AORTIC VALVE REPLACEMENT, TRANSFEMORAL N/A 11/28/2022   Procedure: Transcatheter Aortic Valve Replacement, Transfemoral using a Medtronic 29 mm EVOLUT FX Aortic Valve;  Surgeon: Kathleene Hazel, MD;  Location: [facility] OR;  Service: Open Heart Surgery;  Laterality: N/A;    Current Outpatient Medications  Medication Sig Dispense Refill   acetaminophen (TYLENOL) 500 MG tablet Take 1,000 mg by mouth daily as needed for moderate pain or headache.     amiodarone (PACERONE) 200 MG tablet TAKE 1 TABLET BY MOUTH EVERY DAY 90 tablet 3   amoxicillin (AMOXIL) 500 MG capsule Take 4 capsules (2,000 mg total) by mouth as directed. Take 4 tablets 1 hour prior to dental work, including cleanings. 12 capsule 12   Calcium Carb-Cholecalciferol (CALCIUM 600+D) 600-800 MG-UNIT TABS Take 1 tablet by mouth every evening.      ELIQUIS 5 MG TABS tablet TAKE 1 TABLET BY MOUTH TWICE A DAY (Patient taking differently: Take 5 mg by mouth 2 (two) times daily.) 180 tablet 1   Hydrocortisone-Aloe Vera (CORTIZONE-10/ALOE EX) Apply 1 application topically 2 (two) times daily as needed (itching).     Liniments (BLUE-EMU SUPER STRENGTH) CREA Apply 1 application  topically daily as needed (arthritis).     Menthol (HALLS COUGH DROPS MT) Use as directed 1-2 each in the mouth or throat every Sunday.     metoprolol succinate (TOPROL-XL) 25 MG 24 hr tablet TAKE 1 TABLET (25 MG TOTAL) BY MOUTH DAILY. 90 tablet 3   Multiple Vitamins-Minerals (ONE A DAY WOMEN 50 PLUS PO) Take 1 tablet by mouth every evening.     Multiple Vitamins-Minerals (PRESERVISION AREDS 2 PO) Take 1  capsule by mouth 2 (two) times daily.      Phenol (CHLORASEPTIC MT) Use as directed 1 spray in the mouth or throat at bedtime.     Potassium Chloride ER 20 MEQ TBCR Take 2 tablets (40 mEq total) by mouth daily. 180 tablet 1   torsemide (DEMADEX) 20 MG tablet TAKE 1 TABLET BY MOUTH TWICE A DAY 60 tablet 5   No current facility-administered medications for this visit.    Allergies  Allergen Reactions   Tetanus-Diphtheria Toxoids Td Other (See Comments)    Felt very sick   Cardizem [Diltiazem] Rash    Social History   Socioeconomic History   Marital status: Married    Spouse name: Not on file   Number of children: 5   Years of education: Not on file   Highest education level: Not on file  Occupational History   Occupation: Retired-pastor  Tobacco Use   Smoking status: Never   Smokeless tobacco: Never  Vaping Use  Vaping status: Never Used  Substance and Sexual Activity   Alcohol use: Not Currently   Drug use: Never   Sexual activity: Not Currently    Birth control/protection: Surgical  Other Topics Concern   Not on file  Social History Narrative   Not on file   Social Drivers of Health   Financial Resource Strain: Not on file  Food Insecurity: No Food Insecurity (11/08/2023)   Hunger Vital Sign    Worried About Running Out of Food in the Last Year: Never true    Ran Out of Food in the Last Year: Never true  Transportation Needs: No Transportation Needs (11/08/2023)   PRAPARE - Administrator, Civil Service (Medical): No    Lack of Transportation (Non-Medical): No  Physical Activity: Not on file  Stress: Not on file  Social Connections: Not on file  Intimate Partner Violence: Not At Risk (11/08/2023)   Humiliation, Afraid, Rape, and Kick questionnaire    Fear of Current or Ex-Partner: No    Emotionally Abused: No    Physically Abused: No    Sexually Abused: No    Family History  Problem Relation Age of Onset   Hypertension Mother     Hypertension Father    Diabetes Paternal Grandmother     Review of Systems:  As stated in the HPI and otherwise negative.   There were no vitals taken for this visit.  Physical Examination:  General: Well developed, well nourished, NAD  HEENT: OP clear, mucus membranes moist  SKIN: warm, dry. No rashes. Neuro: No focal deficits  Musculoskeletal: Muscle strength 5/5 all ext  Psychiatric: Mood and affect normal  Neck: No JVD, no carotid bruits, no thyromegaly, no lymphadenopathy.  Lungs:Clear bilaterally, no wheezes, rhonci, crackles Cardiovascular: Regular rate and rhythm. *** No murmurs, gallops or rubs. Abdomen:Soft. Bowel sounds present. Non-tender.  Extremities: No lower extremity edema. Pulses are 2 + in the bilateral DP/PT.   EKG:  EKG is *** ordered today. The ekg shows   Recent Labs: 11/08/2023: B Natriuretic Peptide 211.0; Magnesium 2.4 11/09/2023: BUN 19; Creatinine, Ser 1.04; Hemoglobin 12.3; Platelets 179; Potassium 3.6; Sodium 144    Wt Readings from Last 3 Encounters:  11/26/23 117.9 kg  11/09/23 118.9 kg  01/08/23 123.2 kg     Assessment and Plan:   1. Severe Aortic Valve Stenosis s/p TAVR:  She is doing well a little over a year out from her TAVR. Evidence of PVL on echo in December 2024 with confirmation that her PVL is caused by the bulky RCC leaflet calcification. No good options or percutaneous treatment of this. Will continue to manage conservatively.  She is on Eliquis SBE prophylaxis when indicated.   2. Atrial fibrillation, persistent: Rate controlled. Continue amiodarone, beta blocker and Eliquis  Labs/ tests ordered today include:  No orders of the defined types were placed in this encounter.   Disposition:   F/U with me in one year   Signed, Verne Carrow, MD 03/03/2024 7:12 AM    [facility] [address] [place], [place], [place]  [address] Phone: [phone]; Fax: [phone]

## 2024-03-09 NOTE — Progress Notes (Unsigned)
 Structural Heart Clinic Note  No chief complaint on file.  History of Present Illness: 83 yo female with history of arthritis, HTN, diet controlled diabetes, persistent atrial fibrillation, CAD, GERD and severe aortic stenosis s/p TAVR who is here today for follow up. I saw her as a new consult for discussion regarding her aortic stenosis in April 2022. She was referred by Dr. Jacques Navy. She has been followed in the atrial fibrillation clinic for paroxysmal atrial fibrillation. She has been followed in the wound clinic in [place] for chronic LE wounds. She has had an ablation for renal cell carcinoma.  She has been on amiodarone and Eliquis. Cardiac cath June 2021 with mild non-obstructive CAD. She underwent TAVR in December 2023 with placement of a 29 mm Medtronic Evolut FX THV via the transfemoral approach. She did well following her TAVR. Echo December 2024 with LVEF=55%. 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 Cardiac CT 11/1923 with PVL with bulky calcification of the native RCC leaflet. Mild hypo-attenuating leaflet thickening of all 3 leaflets with no restricted leaflet motion. The images were reviewed in our structural team meeting and we all agreed that balloon expansion of the bioprosthetic valve would not be a good option given the bulky calcification of the RCC leaflet which is the reason for her PVL.   She is here today for follow up. The patient denies any chest pain, dyspnea, palpitations, lower extremity edema, orthopnea, PND, dizziness, near syncope or syncope.   Primary Care Physician: Patient, No Pcp Per Primary Cardiologist: Jacques Navy  Past Medical History:  Diagnosis Date   Anemia    hx of   Arthritis    knee and shoulders   Atrial fibrillation (HCC)    Carpal tunnel syndrome on right    Coronary artery disease 05/2020   Minimal, nobstructive CAD. Tortuous cororany arteries.   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 Clifton James & Dr. Delia Chimes.    Past Surgical History:  Procedure Laterality Date   ABDOMINAL HYSTERECTOMY  1992   complete   BILATERAL CARPAL TUNNEL RELEASE  yrs ago   CARDIOVERSION N/A 09/23/2020   Procedure: CARDIOVERSION;  Surgeon: Meriam Sprague, MD;  Location: [facility] ENDOSCOPY;  Service: Cardiovascular;  Laterality: N/A;   CARDIOVERSION N/A 03/21/2021   Procedure: CARDIOVERSION;  Surgeon: Christell Constant, MD;  Location: [facility] ENDOSCOPY;  Service: Cardiovascular;  Laterality: N/A;   CHOLECYSTECTOMY  1996   CYSTOSCOPY/URETEROSCOPY/HOLMIUM LASER/STENT PLACEMENT Bilateral 08/19/2020   Procedure: CYSTOSCOPY BILATERAL  RETROGRADE  URETEROSCOPY/HOLMIUM LASER/STENT PLACEMENT;  Surgeon: Bjorn Pippin, MD;  Location: [facility];  Service: Urology;  Laterality: Bilateral;   HAMMER TOE SURGERY Right yrs ago   INTRAOPERATIVE TRANSTHORACIC ECHOCARDIOGRAM N/A 11/28/2022   Procedure: INTRAOPERATIVE TRANSTHORACIC ECHOCARDIOGRAM;  Surgeon: Kathleene Hazel, MD;  Location: [facility] OR;  Service: Open  Heart Surgery;  Laterality: N/A;   IR RADIOLOGIST EVAL & MGMT  10/26/2020   IR RADIOLOGIST EVAL & MGMT  01/06/2021   IR RADIOLOGIST EVAL & MGMT  03/22/2021   IR RADIOLOGIST EVAL & MGMT  07/06/2021   IR RADIOLOGIST EVAL & MGMT  01/11/2022   IR RADIOLOGIST EVAL & MGMT  01/15/2023   IR RADIOLOGIST EVAL & MGMT  01/25/2024   LEFT HEART CATH AND CORONARY ANGIOGRAPHY N/A 06/02/2020   Procedure: LEFT HEART CATH  AND CORONARY ANGIOGRAPHY;  Surgeon: Corky Crafts, MD;  Location: [facility] INVASIVE CV LAB;  Service: Cardiovascular;  Laterality: N/A;   RADIOLOGY WITH ANESTHESIA N/A 11/17/2020   Procedure: RADIOLOGY WITH ANESTHESIA RENAL MICROWAVE ABLATION;  Surgeon: Bennie Dallas, MD;  Location: [facility] ORS;  Service: Radiology;  Laterality: N/A;   TONSILLECTOMY  age 65   TRANSCATHETER AORTIC VALVE REPLACEMENT, TRANSFEMORAL N/A 11/28/2022   Procedure: Transcatheter Aortic Valve Replacement, Transfemoral using a Medtronic 29 mm EVOLUT FX Aortic Valve;  Surgeon: Kathleene Hazel, MD;  Location: [facility] OR;  Service: Open Heart Surgery;  Laterality: N/A;    Current Outpatient Medications  Medication Sig Dispense Refill   acetaminophen (TYLENOL) 500 MG tablet Take 1,000 mg by mouth daily as needed for moderate pain or headache.     amiodarone (PACERONE) 200 MG tablet TAKE 1 TABLET BY MOUTH EVERY DAY 90 tablet 3   amoxicillin (AMOXIL) 500 MG capsule Take 4 capsules (2,000 mg total) by mouth as directed. Take 4 tablets 1 hour prior to dental work, including cleanings. 12 capsule 12   Calcium Carb-Cholecalciferol (CALCIUM 600+D) 600-800 MG-UNIT TABS Take 1 tablet by mouth every evening.      ELIQUIS 5 MG TABS tablet TAKE 1 TABLET BY MOUTH TWICE A DAY (Patient taking differently: Take 5 mg by mouth 2 (two) times daily.) 180 tablet 1   Hydrocortisone-Aloe Vera (CORTIZONE-10/ALOE EX) Apply 1 application topically 2 (two) times daily as needed (itching).     Liniments (BLUE-EMU SUPER STRENGTH) CREA Apply 1 application  topically daily as needed (arthritis).     Menthol (HALLS COUGH DROPS MT) Use as directed 1-2 each in the mouth or throat every Sunday.     metoprolol succinate (TOPROL-XL) 25 MG 24 hr tablet TAKE 1 TABLET (25 MG TOTAL) BY MOUTH DAILY. 90 tablet 3   Multiple Vitamins-Minerals (ONE A DAY WOMEN 50 PLUS PO) Take 1 tablet by mouth every evening.     Multiple Vitamins-Minerals (PRESERVISION AREDS 2 PO) Take 1  capsule by mouth 2 (two) times daily.      Phenol (CHLORASEPTIC MT) Use as directed 1 spray in the mouth or throat at bedtime.     Potassium Chloride ER 20 MEQ TBCR Take 2 tablets (40 mEq total) by mouth daily. 180 tablet 1   torsemide (DEMADEX) 20 MG tablet TAKE 1 TABLET BY MOUTH TWICE A DAY 60 tablet 5   No current facility-administered medications for this visit.    Allergies  Allergen Reactions   Tetanus-Diphtheria Toxoids Td Other (See Comments)    Felt very sick   Cardizem [Diltiazem] Rash    Social History   Socioeconomic History   Marital status: Married    Spouse name: Not on file   Number of children: 5   Years of education: Not on file   Highest education level: Not on file  Occupational History   Occupation: Retired-pastor  Tobacco Use   Smoking status: Never   Smokeless tobacco: Never  Vaping Use  Vaping status: Never Used  Substance and Sexual Activity   Alcohol use: Not Currently   Drug use: Never   Sexual activity: Not Currently    Birth control/protection: Surgical  Other Topics Concern   Not on file  Social History Narrative   Not on file   Social Drivers of Health   Financial Resource Strain: Not on file  Food Insecurity: No Food Insecurity (11/08/2023)   Hunger Vital Sign    Worried About Running Out of Food in the Last Year: Never true    Ran Out of Food in the Last Year: Never true  Transportation Needs: No Transportation Needs (11/08/2023)   PRAPARE - Administrator, Civil Service (Medical): No    Lack of Transportation (Non-Medical): No  Physical Activity: Not on file  Stress: Not on file  Social Connections: Not on file  Intimate Partner Violence: Not At Risk (11/08/2023)   Humiliation, Afraid, Rape, and Kick questionnaire    Fear of Current or Ex-Partner: No    Emotionally Abused: No    Physically Abused: No    Sexually Abused: No    Family History  Problem Relation Age of Onset   Hypertension Mother     Hypertension Father    Diabetes Paternal Grandmother     Review of Systems:  As stated in the HPI and otherwise negative.   There were no vitals taken for this visit.  Physical Examination:  General: Well developed, well nourished, NAD  HEENT: OP clear, mucus membranes moist  SKIN: warm, dry. No rashes. Neuro: No focal deficits  Musculoskeletal: Muscle strength 5/5 all ext  Psychiatric: Mood and affect normal  Neck: No JVD, no carotid bruits, no thyromegaly, no lymphadenopathy.  Lungs:Clear bilaterally, no wheezes, rhonci, crackles Cardiovascular: Regular rate and rhythm. *** No murmurs, gallops or rubs. Abdomen:Soft. Bowel sounds present. Non-tender.  Extremities: No lower extremity edema. Pulses are 2 + in the bilateral DP/PT.   EKG:  EKG is *** ordered today. The ekg shows   Recent Labs: 11/08/2023: B Natriuretic Peptide 211.0; Magnesium 2.4 11/09/2023: BUN 19; Creatinine, Ser 1.04; Hemoglobin 12.3; Platelets 179; Potassium 3.6; Sodium 144    Wt Readings from Last 3 Encounters:  11/26/23 117.9 kg  11/09/23 118.9 kg  01/08/23 123.2 kg     Assessment and Plan:   1. Severe Aortic Valve Stenosis s/p TAVR:  She is doing well a little over a year out from her TAVR. Evidence of PVL on echo in December 2024 with confirmation that her PVL is caused by the bulky RCC leaflet calcification. No good options or percutaneous treatment of this. Will continue to manage conservatively.  She is on Eliquis SBE prophylaxis when indicated.   2. Atrial fibrillation, persistent: Rate controlled. Continue amiodarone, beta blocker and Eliquis  Labs/ tests ordered today include:  No orders of the defined types were placed in this encounter.   Disposition:   F/U with me in one year   Signed, Verne Carrow, MD 03/09/2024 4:32 PM    [facility] [address] [place], [place], [place]  [address] Phone: [phone]; Fax: [phone]

## 2024-03-10 ENCOUNTER — Ambulatory Visit: Attending: Cardiovascular Disease | Admitting: Cardiovascular Disease

## 2024-03-10 VITALS — BP 156/54 | HR 59 | Ht 66.0 in | Wt 248.2 lb

## 2024-03-10 DIAGNOSIS — T8203XD Leakage of heart valve prosthesis, subsequent encounter: Secondary | ICD-10-CM | POA: Diagnosis not present

## 2024-03-10 DIAGNOSIS — Z952 Presence of prosthetic heart valve: Secondary | ICD-10-CM | POA: Diagnosis not present

## 2024-03-10 DIAGNOSIS — I4819 Other persistent atrial fibrillation: Secondary | ICD-10-CM

## 2024-03-10 DIAGNOSIS — T8203XA Leakage of heart valve prosthesis, initial encounter: Secondary | ICD-10-CM

## 2024-03-10 MED ORDER — POTASSIUM CHLORIDE ER 20 MEQ PO TBCR: 2.0000 | EXTENDED_RELEASE_TABLET | Freq: Every day | ORAL | 3 refills | Status: AC

## 2024-03-10 NOTE — Patient Instructions (Signed)
 Medication Instructions:  The current medical regimen is effective;  continue present plan and medications.  *If you need a refill on your cardiac medications before your next appointment, please call your pharmacy*   Follow-Up: At [facility], you and your health needs are our priority.  As part of our continuing mission to provide you with exceptional heart care, we have created designated Provider Care Teams.  These Care Teams include your primary Cardiologist (physician) and Advanced Practice Providers (APPs -  Physician Assistants and Nurse Practitioners) who all work together to provide you with the care you need, when you need it.  We recommend signing up for the patient portal called "MyChart".  Sign up information is provided on this After Visit Summary.  MyChart is used to connect with patients for Virtual Visits (Telemedicine).  Patients are able to view lab/test results, encounter notes, upcoming appointments, etc.  Non-urgent messages can be sent to your provider as well.   To learn more about what you can do with MyChart, go to [email or web address].    Your next appointment:   1 year(s)  Provider:   Dr Verne Carrow       1st Floor: - Lobby - Registration  - Pharmacy  - Lab - Cafe  2nd Floor: - PV Lab - Diagnostic Testing (echo, CT, nuclear med)  3rd Floor: - Vacant  4th Floor: - TCTS (cardiothoracic surgery) - AFib Clinic - Structural Heart Clinic - Vascular Surgery  - Vascular Ultrasound  5th Floor: - [facility]Care Cardiology (general and EP) - Clinical Pharmacy for coumadin, hypertension, lipid, weight-loss medications, and med management appointments    Valet parking services will be available as well.

## 2024-03-13 ENCOUNTER — Telehealth: Payer: Self-pay | Admitting: Cardiovascular Disease

## 2024-03-13 ENCOUNTER — Other Ambulatory Visit: Payer: Self-pay | Admitting: Internal Medicine

## 2024-03-13 MED ORDER — METOPROLOL SUCCINATE ER 25 MG PO TB24: 25.0000 mg | ORAL_TABLET | Freq: Every day | ORAL | 3 refills | Status: AC

## 2024-03-13 NOTE — Telephone Encounter (Signed)
 Pt's medication was sent to pt's pharmacy as requested. Confirmation received.

## 2024-03-13 NOTE — Telephone Encounter (Signed)
*  STAT* If patient is at the pharmacy, call can be transferred to refill team.   1. Which medications need to be refilled? (please list name of each medication and dose if known)   metoprolol succinate (TOPROL-XL) 25 MG 24 hr tablet     2. Which pharmacy/location (including street and city if local pharmacy) is medication to be sent to?  [id number] - , [place] - [address]      3. Do they need a 30 day or 90 day supply? 90 day

## 2024-04-14 DIAGNOSIS — Z961 Presence of intraocular lens: Secondary | ICD-10-CM | POA: Diagnosis not present

## 2024-04-14 DIAGNOSIS — H04123 Dry eye syndrome of bilateral lacrimal glands: Secondary | ICD-10-CM | POA: Diagnosis not present

## 2024-04-14 DIAGNOSIS — H353131 Nonexudative age-related macular degeneration, bilateral, early dry stage: Secondary | ICD-10-CM | POA: Diagnosis not present

## 2024-04-14 DIAGNOSIS — E119 Type 2 diabetes mellitus without complications: Secondary | ICD-10-CM | POA: Diagnosis not present

## 2024-04-22 DIAGNOSIS — M170 Bilateral primary osteoarthritis of knee: Secondary | ICD-10-CM | POA: Diagnosis not present

## 2024-05-03 ENCOUNTER — Other Ambulatory Visit (HOSPITAL_COMMUNITY): Payer: Self-pay | Admitting: Cardiovascular Disease

## 2024-05-03 DIAGNOSIS — I4819 Other persistent atrial fibrillation: Secondary | ICD-10-CM

## 2024-05-05 NOTE — Telephone Encounter (Signed)
 Prescription refill request for Eliquis  received. Indication: PAF Last office visit: 03/10/24  Beatrice Lin MD Scr: 1.04 on 11/09/23  Epic Age: 83 Weight: 112.6kg  Based on above findings Eliquis  5mg  twice daily is the appropriate dose.  Refill approved.

## 2024-05-06 ENCOUNTER — Telehealth: Payer: Self-pay | Admitting: Cardiovascular Disease

## 2024-05-06 MED ORDER — AMIODARONE HCL 200 MG PO TABS: 200.0000 mg | ORAL_TABLET | Freq: Every day | ORAL | 3 refills | Status: AC

## 2024-05-06 NOTE — Telephone Encounter (Signed)
 Pt's medication was sent to pt's pharmacy as requested. Confirmation received.

## 2024-05-06 NOTE — Telephone Encounter (Signed)
*  STAT* If patient is at the pharmacy, call can be transferred to refill team.   1. Which medications need to be refilled? (please list name of each medication and dose if known) amiodarone  (PACERONE ) 200 MG tablet    2. Would you like to learn more about the convenience, safety, & potential cost savings by using the [facility]? No   3. Are you open to using the [facility] (Type [facility] ) No   4. Which pharmacy/location (including street and city if local pharmacy) is medication to be sent to? [id number], [place] - [address]    5. Do they need a 30 day or 90 day supply? 90 day   Pt is out of medication

## 2024-05-07 ENCOUNTER — Telehealth: Payer: Self-pay | Admitting: Cardiovascular Disease

## 2024-05-07 NOTE — Telephone Encounter (Signed)
*  STAT* If patient is at the pharmacy, call can be transferred to refill team.   1. Which medications need to be refilled? (please list name of each medication and dose if known)   amiodarone  (PACERONE ) 200 MG tablet   2. Would you like to learn more about the convenience, safety, & potential cost savings by using the [facility]?   3. Are you open to using the [facility] (Type [facility]. ).  4. Which pharmacy/location (including street and city if local pharmacy) is medication to be sent to?  [id number], [place] - [address]   5. Do they need a 30 day or 90 day supply?   90 day  Patient stated her pharmacy told her the instructions for this medication were unclear.  [facility]  Patient stated she ran out of this medication on Monday.

## 2024-07-02 DIAGNOSIS — M170 Bilateral primary osteoarthritis of knee: Secondary | ICD-10-CM | POA: Diagnosis not present

## 2024-07-03 ENCOUNTER — Telehealth: Payer: Self-pay

## 2024-07-03 NOTE — Telephone Encounter (Signed)
   Pre-operative Risk Assessment    Patient Name: [name] [name]  DOB: 03/17/1941 MRN: [id number]   Date of last office visit: 03/10/24 LONNI CASH, MD Date of next office visit: NONE   Request for Surgical Clearance    Procedure:  LEFT TOTAL KNEE ARTHROPLASTY  Date of Surgery:  Clearance 09/23/24                                Surgeon:  DR DONNICE CAR Surgeon's Group or Practice Name:  [facility] Phone number:  [phone] Fax number:  [phone]  ATTN: JOEN WILLS   Type of Clearance Requested:   - Medical  - Pharmacy:  Hold Apixaban  (Eliquis )     Type of Anesthesia:  Spinal   Additional requests/questions:    SignedLucie DELENA Ku   07/03/2024, 11:41 AM

## 2024-07-04 ENCOUNTER — Telehealth: Payer: Self-pay

## 2024-07-04 NOTE — Telephone Encounter (Signed)
   Name: [name] [name]  DOB: 1941-05-03  MRN: [id number]  Primary Cardiologist: None   Preoperative team, please contact this patient and set up a phone call appointment for further preoperative risk assessment. Please obtain consent and complete medication review. Thank you for your help.  I confirm that guidance regarding antiplatelet and oral anticoagulation therapy has been completed and, if necessary, noted below.  Procedure: LEFT TOTAL KNEE ARTHROPLASTY  Date of procedure: 09/23/24     CHA2DS2-VASc Score = 7  This indicates a 11.2% annual risk of stroke. [name] score is based upon: CHF History: 1 HTN History: 1 Diabetes History: 1 Stroke History: 0 Vascular Disease History: 1 (elevated CAC) Age Score: 2 Gender Score: 1     CrCl 77 ml/min Platelet count 179K   Patient will need to hold Eliquis  for 3 days prior to procedure. Per Dr. Verlin, okay to hold Eliquis .  I also confirmed the patient resides in the state of [place] . As per [facility] Medical Board telemedicine laws, the patient must reside in the state in which the provider is licensed.   Artist Pouch, PA-C 07/04/2024, 12:39 PM [facility]

## 2024-07-04 NOTE — Telephone Encounter (Signed)
 Patient with diagnosis of A Fib + AVR on Eliquis  for anticoagulation.    Procedure: LEFT TOTAL KNEE ARTHROPLASTY  Date of procedure: 09/23/24   CHA2DS2-VASc Score = 7  This indicates a 11.2% annual risk of stroke. [name] score is based upon: CHF History: 1 HTN History: 1 Diabetes History: 1 Stroke History: 0 Vascular Disease History: 1 (elevated CAC) Age Score: 2 Gender Score: 1    CrCl 77 ml/min Platelet count 179K  Patient will need to hold Eliquis  for 3 days prior to procedure. Due to elevated risk score, will route to Dr Verlin for approval  **This guidance is not considered finalized until pre-operative APP has relayed final recommendations.**

## 2024-07-04 NOTE — Telephone Encounter (Signed)
 Preop televisit now scheduled, med rec and consent done.

## 2024-07-04 NOTE — Telephone Encounter (Signed)
  Patient Consent for Virtual Visit        [name] [name] has provided verbal consent on 07/04/2024 for a virtual visit (video or telephone).   CONSENT FOR VIRTUAL VISIT FOR:  [name] [name]  By participating in this virtual visit I agree to the following:  I hereby voluntarily request, consent and authorize [facility] and its employed or contracted physicians, physician assistants, nurse practitioners or other licensed health care professionals (the Practitioner), to provide me with telemedicine health care services (the "Services) as deemed necessary by the treating Practitioner. I acknowledge and consent to receive the Services by the Practitioner via telemedicine. I understand that the telemedicine visit will involve communicating with the Practitioner through live audiovisual communication technology and the disclosure of certain medical information by electronic transmission. I acknowledge that I have been given the opportunity to request an in-person assessment or other available alternative prior to the telemedicine visit and am voluntarily participating in the telemedicine visit.  I understand that I have the right to withhold or withdraw my consent to the use of telemedicine in the course of my care at any time, without affecting my right to future care or treatment, and that the Practitioner or I may terminate the telemedicine visit at any time. I understand that I have the right to inspect all information obtained and/or recorded in the course of the telemedicine visit and may receive copies of available information for a reasonable fee.  I understand that some of the potential risks of receiving the Services via telemedicine include:  Delay or interruption in medical evaluation due to technological equipment failure or disruption; Information transmitted may not be sufficient (e.g. poor resolution of images) to allow for appropriate medical decision making by the Practitioner;  and/or  In rare instances, security protocols could fail, causing a breach of personal health information.  Furthermore, I acknowledge that it is my responsibility to provide information about my medical history, conditions and care that is complete and accurate to the best of my ability. I acknowledge that Practitioner's advice, recommendations, and/or decision may be based on factors not within their control, such as incomplete or inaccurate data provided by me or distortions of diagnostic images or specimens that may result from electronic transmissions. I understand that the practice of medicine is not an exact science and that Practitioner makes no warranties or guarantees regarding treatment outcomes. I acknowledge that a copy of this consent can be made available to me via my patient portal [facility] MyChart), or I can request a printed copy by calling the office of  HeartCare.    I understand that my insurance will be billed for this visit.   I have read or had this consent read to me. I understand the contents of this consent, which adequately explains the benefits and risks of the Services being provided via telemedicine.  I have been provided ample opportunity to ask questions regarding this consent and the Services and have had my questions answered to my satisfaction. I give my informed consent for the services to be provided through the use of telemedicine in my medical care

## 2024-07-08 DIAGNOSIS — M1711 Unilateral primary osteoarthritis, right knee: Secondary | ICD-10-CM | POA: Diagnosis not present

## 2024-07-08 DIAGNOSIS — Z8639 Personal history of other endocrine, nutritional and metabolic disease: Secondary | ICD-10-CM | POA: Diagnosis not present

## 2024-08-13 ENCOUNTER — Other Ambulatory Visit: Payer: Self-pay

## 2024-08-13 MED ORDER — AMOXICILLIN 500 MG PO CAPS: 2000.0000 mg | ORAL_CAPSULE | ORAL | 1 refills | Status: AC

## 2024-08-27 DIAGNOSIS — M25662 Stiffness of left knee, not elsewhere classified: Secondary | ICD-10-CM | POA: Diagnosis not present

## 2024-08-27 DIAGNOSIS — M25562 Pain in left knee: Secondary | ICD-10-CM | POA: Diagnosis not present

## 2024-08-27 NOTE — Telephone Encounter (Signed)
 2nd request received:  Pt is scheduled for TELE Preop appt 09/15/24   Will update the surgeons office.

## 2024-08-28 ENCOUNTER — Telehealth: Payer: Self-pay | Admitting: Cardiovascular Disease

## 2024-08-28 NOTE — Telephone Encounter (Signed)
 Form given to pre op team

## 2024-08-28 NOTE — Telephone Encounter (Signed)
 Pt dropped off [facility] preoperative clearance form to be filled out and faxed in.  Location; Dr. Verlin mailbox.

## 2024-08-28 NOTE — Telephone Encounter (Signed)
 See clearance request entered in 07/04/24.

## 2024-09-10 NOTE — Progress Notes (Addendum)
 Anesthesia Review:  PCP: none Cardiologist : McAlhany  LOV 03/10/24  Nephrology-   PPM/ ICD: Device Orders: Rep Notified:  Chest x-ray : 11/08/23- 2 view  EKG : 11/12/23 amd 9.25/25  CT Cors- 12/23/23  Echo : 11/26/23  Stress test: Cardiac Cath :   Activity level: can do a flight of stairs without difficulty  Sleep Study/ CPAP : none  Fasting Blood Sugar :      / Checks Blood Sugar -- times a day:      DM- type2- diet controlled, on no meds , does not check glucose at home  Hgba1c-  09/11/24-4.8     Blood Thinner/ Instructions /Last Dose: ASA / Instructions/ Last Dose :    Eliquis  - last dose on  09/18/2024 per pt per cardiology she is to stop 5 days prior to surgery.     Pt states at preop on 09/11/24 supposed to be right knee instead of left knee and she spoke with Lyle at office.  Blank consent form on front of chart  Joen Dings at office Made aware on 09/11/24.   Heart rate on dynamapp was 42-45 at preop .  PT states it runs 42-45 after my last cardioversion.  PT is asymptomatic.  EKG done.  Harlene [facility] aware.  No new orders given.      BMP done 09/11/24 routed to Dr ernie on 09/11/24.

## 2024-09-10 NOTE — Patient Instructions (Signed)
 SURGICAL WAITING ROOM VISITATION  Patients having surgery or a procedure may have no more than 2 support people in the waiting area - these visitors may rotate.    Children under the age of 74 must have an adult with them who is not the patient.  Visitors with respiratory illnesses are discouraged from visiting and should remain at home.  If the patient needs to stay at the hospital during part of their recovery, the visitor guidelines for inpatient rooms apply. Pre-op nurse will coordinate an appropriate time for 1 support person to accompany patient in pre-op.  This support person may not rotate.    Please refer to the [facility] website for the visitor guidelines for Inpatients (after your surgery is over and you are in a regular room).       Your procedure is scheduled on:  09/23/2024    Report to [facility] Main Entrance    Report to admitting at  1000 AM   Call this number if you have problems the morning of surgery [phone]   Do not eat food :After Midnight.   After Midnight you may have the following liquids until _ 0930_____ AM DAY OF SURGERY  Water Non-Citrus Juices (without pulp, NO RED-Apple, White grape, White cranberry) Black Coffee (NO MILK/CREAM OR CREAMERS, sugar ok)  Clear Tea (NO MILK/CREAM OR CREAMERS, sugar ok) regular and decaf                             Plain Jell-O (NO RED)                                           Fruit ices (not with fruit pulp, NO RED)                                     Popsicles (NO RED)                                                               Sports drinks like Gatorade (NO RED)                     The day of surgery:  Drink ONE (1) Pre-Surgery Clear Ensure or G2 at  0930AM the morning of surgery. Drink in one sitting. Do not sip.  This drink was given to you during your hospital  pre-op appointment visit. Nothing else to drink after completing the  Pre-Surgery Clear Ensure or G2.          If you have  questions, please contact your surgeon's office.       Oral Hygiene is also important to reduce your risk of infection.                                    Remember - BRUSH YOUR TEETH THE MORNING OF SURGERY WITH YOUR REGULAR TOOTHPASTE  DENTURES WILL BE REMOVED PRIOR TO SURGERY PLEASE DO NOT APPLY Poly grip OR  ADHESIVES!!!   Do NOT smoke after Midnight   Stop all vitamins and herbal supplements 7 days before surgery.   Take these medicines the morning of surgery with A SIP OF WATER:  amiodarone , toprol    DO NOT TAKE ANY ORAL DIABETIC MEDICATIONS DAY OF YOUR SURGERY  Bring CPAP mask and tubing day of surgery.                              You may not have any metal on your body including hair pins, jewelry, and body piercing             Do not wear make-up, lotions, powders, perfumes/cologne, or deodorant  Do not wear nail polish including gel and S&S, artificial/acrylic nails, or any other type of covering on natural nails including finger and toenails. If you have artificial nails, gel coating, etc. that needs to be removed by a nail salon please have this removed prior to surgery or surgery may need to be canceled/ delayed if the surgeon/ anesthesia feels like they are unable to be safely monitored.   Do not shave  48 hours prior to surgery.               Men may shave face and neck.   Do not bring valuables to the hospital. [facility] IS NOT             RESPONSIBLE   FOR VALUABLES.   Contacts, glasses, dentures or bridgework may not be worn into surgery.   Bring small overnight bag day of surgery.   DO NOT BRING YOUR HOME MEDICATIONS TO THE HOSPITAL. PHARMACY WILL DISPENSE MEDICATIONS LISTED ON YOUR MEDICATION LIST TO YOU DURING YOUR ADMISSION IN THE HOSPITAL!    Patients discharged on the day of surgery will not be allowed to drive home.  Someone NEEDS to stay with you for the first 24 hours after anesthesia.   Special Instructions: Bring a copy of your healthcare power  of attorney and living will documents the day of surgery if you haven't scanned them before.              Please read over the following fact sheets you were given: IF YOU HAVE QUESTIONS ABOUT YOUR PRE-OP INSTRUCTIONS PLEASE CALL [phone].   If you received a COVID test during your pre-op visit  it is requested that you wear a mask when out in public, stay away from anyone that may not be feeling well and notify your surgeon if you develop symptoms. If you test positive for Covid or have been in contact with anyone that has tested positive in the last 10 days please notify you surgeon.      Pre-operative 5 CHG Bath Instructions   You can play a key role in reducing the risk of infection after surgery. Your skin needs to be as free of germs as possible. You can reduce the number of germs on your skin by washing with CHG (chlorhexidine  gluconate) soap before surgery. CHG is an antiseptic soap that kills germs and continues to kill germs even after washing.   DO NOT use if you have an allergy to chlorhexidine /CHG or antibacterial soaps. If your skin becomes reddened or irritated, stop using the CHG and notify one of our RNs at [phone].   Please shower with the CHG soap starting 4 days before surgery using the following schedule:     Please keep in mind the  following:  DO NOT shave, including legs and underarms, starting the day of your first shower.   You may shave your face at any point before/day of surgery.  Place clean sheets on your bed the day you start using CHG soap. Use a clean washcloth (not used since being washed) for each shower. DO NOT sleep with pets once you start using the CHG.   CHG Shower Instructions:  If you choose to wash your hair and private area, wash first with your normal shampoo/soap.  After you use shampoo/soap, rinse your hair and body thoroughly to remove shampoo/soap residue.  Turn the water OFF and apply about 3 tablespoons (45 ml) of CHG soap to a  CLEAN washcloth.  Apply CHG soap ONLY FROM YOUR NECK DOWN TO YOUR TOES (washing for 3-5 minutes)  DO NOT use CHG soap on face, private areas, open wounds, or sores.  Pay special attention to the area where your surgery is being performed.  If you are having back surgery, having someone wash your back for you may be helpful. Wait 2 minutes after CHG soap is applied, then you may rinse off the CHG soap.  Pat dry with a clean towel  Put on clean clothes/pajamas   If you choose to wear lotion, please use ONLY the CHG-compatible lotions on the back of this paper.     Additional instructions for the day of surgery: DO NOT APPLY any lotions, deodorants, cologne, or perfumes.   Put on clean/comfortable clothes.  Brush your teeth.  Ask your nurse before applying any prescription medications to the skin.      CHG Compatible Lotions   Aveeno Moisturizing lotion  Cetaphil Moisturizing Cream  Cetaphil Moisturizing Lotion  Clairol Herbal Essence Moisturizing Lotion, Dry Skin  Clairol Herbal Essence Moisturizing Lotion, Extra Dry Skin  Clairol Herbal Essence Moisturizing Lotion, Normal Skin  Curel Age Defying Therapeutic Moisturizing Lotion with Alpha Hydroxy  Curel Extreme Care Body Lotion  Curel Soothing Hands Moisturizing Hand Lotion  Curel Therapeutic Moisturizing Cream, Fragrance-Free  Curel Therapeutic Moisturizing Lotion, Fragrance-Free  Curel Therapeutic Moisturizing Lotion, Original Formula  Eucerin Daily Replenishing Lotion  Eucerin Dry Skin Therapy Plus Alpha Hydroxy Crme  Eucerin Dry Skin Therapy Plus Alpha Hydroxy Lotion  Eucerin Original Crme  Eucerin Original Lotion  Eucerin Plus Crme Eucerin Plus Lotion  Eucerin TriLipid Replenishing Lotion  Keri Anti-Bacterial Hand Lotion  Keri Deep Conditioning Original Lotion Dry Skin Formula Softly Scented  Keri Deep Conditioning Original Lotion, Fragrance Free Sensitive Skin Formula  Keri Lotion Fast Absorbing Fragrance Free  Sensitive Skin Formula  Keri Lotion Fast Absorbing Softly Scented Dry Skin Formula  Keri Original Lotion  Keri Skin Renewal Lotion Keri Silky Smooth Lotion  Keri Silky Smooth Sensitive Skin Lotion  Nivea Body Creamy Conditioning Oil  Nivea Body Extra Enriched Teacher, adult education Moisturizing Lotion Nivea Crme  Nivea Skin Firming Lotion  NutraDerm 30 Skin Lotion  NutraDerm Skin Lotion  NutraDerm Therapeutic Skin Cream  NutraDerm Therapeutic Skin Lotion  ProShield Protective Hand Cream  Provon moisturizing lotion

## 2024-09-11 ENCOUNTER — Other Ambulatory Visit: Payer: Self-pay

## 2024-09-11 ENCOUNTER — Encounter (HOSPITAL_COMMUNITY)
Admission: RE | Admit: 2024-09-11 | Discharge: 2024-09-11 | Disposition: A | Discharge status: Home / Self Care | Source: Ambulatory Visit | Attending: Orthopedic Surgery | Admitting: Orthopedic Surgery

## 2024-09-11 ENCOUNTER — Encounter (HOSPITAL_COMMUNITY): Payer: Self-pay

## 2024-09-11 VITALS — BP 155/47 | HR 45 | Temp 98.0°F | Resp 16 | Ht 66.0 in

## 2024-09-11 DIAGNOSIS — Z01818 Encounter for other preprocedural examination: Secondary | ICD-10-CM | POA: Diagnosis not present

## 2024-09-11 HISTORY — DX: Malignant (primary) neoplasm, unspecified: C80.1

## 2024-09-11 HISTORY — DX: Pneumonia, unspecified organism: J18.9

## 2024-09-11 LAB — BASIC METABOLIC PANEL WITH GFR
Anion gap: 12 (ref 5–15)
BUN: 30 mg/dL — ABNORMAL HIGH (ref 8–23)
CO2: 24 mmol/L (ref 22–32)
Calcium: 9.4 mg/dL (ref 8.9–10.3)
Chloride: 106 mmol/L (ref 98–111)
Creatinine, Ser: 1.7 mg/dL — ABNORMAL HIGH (ref 0.44–1.00)
GFR, Estimated: 30 mL/min — ABNORMAL LOW (ref >60)
Glucose, Bld: 105 mg/dL — ABNORMAL HIGH (ref 70–99)
Potassium: 4.9 mmol/L (ref 3.5–5.1)
Sodium: 142 mmol/L (ref 135–145)

## 2024-09-11 LAB — HEMOGLOBIN A1C
Hgb A1c MFr Bld: 4.8 % (ref 4.8–5.6)
Mean Plasma Glucose: 91.06 mg/dL

## 2024-09-11 LAB — SURGICAL PCR SCREEN
MRSA, PCR: NEGATIVE
Staphylococcus aureus: POSITIVE — AB

## 2024-09-11 LAB — CBC
HCT: 42.2 % (ref 36.0–46.0)
Hemoglobin: 13.5 g/dL (ref 12.0–15.0)
MCH: 31.5 pg (ref 26.0–34.0)
MCHC: 32 g/dL (ref 30.0–36.0)
MCV: 98.4 fL (ref 80.0–100.0)
Platelets: 182 K/uL (ref 150–400)
RBC: 4.29 MIL/uL (ref 3.87–5.11)
RDW: 14.8 % (ref 11.5–15.5)
WBC: 7.4 K/uL (ref 4.0–10.5)
nRBC: 0 % (ref 0.0–0.2)

## 2024-09-11 LAB — GLUCOSE, CAPILLARY: Glucose-Capillary: 97 mg/dL (ref 70–99)

## 2024-09-15 ENCOUNTER — Other Ambulatory Visit: Payer: Self-pay

## 2024-09-15 ENCOUNTER — Ambulatory Visit: Attending: Cardiology | Admitting: Nurse Practitioner

## 2024-09-15 ENCOUNTER — Encounter: Payer: Self-pay | Admitting: Nurse Practitioner

## 2024-09-15 DIAGNOSIS — Z01810 Encounter for preprocedural cardiovascular examination: Secondary | ICD-10-CM | POA: Diagnosis not present

## 2024-09-15 NOTE — Progress Notes (Signed)
 Virtual Visit via Telephone Note   Because of [name] [name] co-morbid illnesses, she is at least at moderate risk for complications without adequate follow up.  This format is felt to be most appropriate for this patient at this time.  Due to technical limitations with video connection (technology), today's appointment will be conducted as an audio only telehealth visit, and [name] verbally agreed to proceed in this manner.   All issues noted in this document were discussed and addressed.  No physical exam could be performed with this format.  Evaluation Performed:  Preoperative cardiovascular risk assessment _____________   Date:  09/15/2024   Patient ID:  [name] [name], DOB 1941/07/19, MRN [id number] Patient Location:  Home Provider location:   Office  Primary Care Provider:  Patient, No Pcp Per Primary Cardiologist:  None  Chief Complaint / Patient Profile   83 y.o. 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  History of Present Illness    [name] [name] is a 83 y.o. female who presents via audio/video conferencing for a telehealth visit today.  Pt was last seen in cardiology clinic on 03/10/24 by Dr. Verlin.  At that time TAEGEN LENNOX was doing well.  The patient is now pending procedure as outlined above. Since her last visit, she denies chest pain, lower extremity edema, fatigue, palpitations, melena, hematuria, hemoptysis, diaphoresis, weakness, presyncope, syncope, orthopnea, and PND. She reports shortness of breath for many years that is unchanged. She denies orthopnea, PND or chest pain. She is able to complete > 4 METS activity without concerning cardiac symptoms.    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 Tortuous cororany arteries.   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 Verlin & Dr. Murriel.   Past Surgical History:  Procedure Laterality Date   ABDOMINAL HYSTERECTOMY  1992   complete   BILATERAL CARPAL TUNNEL RELEASE  yrs ago   CARDIOVERSION N/A 09/23/2020   Procedure: CARDIOVERSION;  Surgeon: Hobart Powell BRAVO, MD;  Location: [facility] ENDOSCOPY;  Service: Cardiovascular;  Laterality: N/A;   CARDIOVERSION N/A 03/21/2021   Procedure: CARDIOVERSION;  Surgeon: Santo Stanly LABOR, MD;  Location: [facility] ENDOSCOPY;  Service: Cardiovascular;  Laterality: N/A;   CHOLECYSTECTOMY  1996   CYSTOSCOPY/URETEROSCOPY/HOLMIUM LASER/STENT PLACEMENT Bilateral 08/19/2020   Procedure: CYSTOSCOPY BILATERAL  RETROGRADE  URETEROSCOPY/HOLMIUM LASER/STENT PLACEMENT;  Surgeon: Watt Rush, MD;  Location:   SURGERY CENTER;  Service: Urology;  Laterality: Bilateral;   HAMMER TOE SURGERY Right yrs ago   INTRAOPERATIVE TRANSTHORACIC ECHOCARDIOGRAM N/A 11/28/2022   Procedure: INTRAOPERATIVE TRANSTHORACIC ECHOCARDIOGRAM;  Surgeon: Verlin Lonni BIRCH, MD;  Location: [facility] OR;  Service: Open Heart Surgery;  Laterality: N/A;   IR RADIOLOGIST EVAL & MGMT  10/26/2020   IR RADIOLOGIST EVAL & MGMT  01/06/2021   IR RADIOLOGIST EVAL & MGMT   03/22/2021   IR RADIOLOGIST EVAL & MGMT  07/06/2021   IR RADIOLOGIST EVAL & MGMT  01/11/2022   IR RADIOLOGIST EVAL & MGMT  01/15/2023   IR RADIOLOGIST EVAL & MGMT  01/25/2024   LEFT HEART CATH AND CORONARY ANGIOGRAPHY N/A 06/02/2020   Procedure: LEFT HEART CATH AND CORONARY ANGIOGRAPHY;  Surgeon: Dann Candyce RAMAN, MD;  Location: [facility] INVASIVE CV LAB;  Service: Cardiovascular;  Laterality: N/A;   RADIOLOGY WITH ANESTHESIA N/A 11/17/2020   Procedure: RADIOLOGY WITH ANESTHESIA RENAL MICROWAVE ABLATION;  Surgeon: Jennefer Ester PARAS, MD;  Location: [facility] ORS;  Service: Radiology;  Laterality: N/A;   TONSILLECTOMY  age 44   TRANSCATHETER AORTIC VALVE REPLACEMENT, TRANSFEMORAL N/A 11/28/2022   Procedure: Transcatheter Aortic Valve Replacement, Transfemoral using a Medtronic 29 mm EVOLUT FX Aortic Valve;  Surgeon: Verlin Lonni BIRCH, MD;  Location: [facility] OR;  Service: Open Heart Surgery;  Laterality: N/A;    Allergies  Allergies  Allergen Reactions   Tetanus-Diphtheria Toxoids Td Other (See Comments)    Felt very sick   Cardizem  [Diltiazem ] Rash    Home Medications    Prior to Admission medications   Medication Sig Start Date End Date Taking? Authorizing Provider  acetaminophen  (TYLENOL ) 500 MG tablet Take 1,000 mg by mouth daily as needed for moderate pain or headache.    [provider]  amiodarone  (PACERONE ) 200 MG tablet Take 1 tablet (200 mg total) by mouth daily. 05/06/24   Verlin Lonni BIRCH, MD  amoxicillin  (AMOXIL ) 500 MG capsule Take 4 capsules (2,000 mg total) by mouth as directed. Take 4 tablets 1 hour prior to dental work, including cleanings. 08/13/24   Verlin Lonni BIRCH, MD  Calcium  Carb-Cholecalciferol (CALCIUM  600+D) 600-800 MG-UNIT TABS Take 1 tablet by mouth every evening.     [provider]  carboxymethylcellulose (REFRESH PLUS) 0.5 % SOLN Place 1 drop into both eyes 3 (three) times daily as needed (dry eyes).    [provider]  ELIQUIS  5 MG TABS  tablet TAKE 1 TABLET BY MOUTH TWICE A DAY 05/05/24   Verlin Lonni BIRCH, MD  Hydrocortisone -Aloe Vera (CORTIZONE-10/ALOE EX) Apply 1 application topically 2 (two) times daily as needed (itching).    [provider]  Liniments (BLUE-EMU SUPER STRENGTH) CREA Apply 1 application  topically daily as needed (arthritis).    [provider]  Menthol (HALLS COUGH DROPS MT) Use as directed 1-2 each in the mouth or throat every Sunday.    [provider]  metoprolol  succinate (TOPROL -XL) 25 MG 24 hr tablet Take 1 tablet (25 mg total) by mouth daily. 03/13/24   Verlin Lonni BIRCH, MD  Multiple Vitamins-Minerals (ONE A DAY WOMEN 50 PLUS PO) Take 1 tablet by mouth every evening.    [provider]  Multiple Vitamins-Minerals (PRESERVISION AREDS 2 PO) Take 1 capsule by mouth 2 (two) times daily.     [provider]  Phenol [facility] MT) Use as directed 1 spray in the mouth or throat at bedtime.    [provider]  Potassium Chloride  ER 20  MEQ TBCR Take 2 tablets (40 mEq total) by mouth daily. 03/10/24   Verlin Lonni BIRCH, MD  torsemide  (DEMADEX ) 20 MG tablet TAKE 1 TABLET BY MOUTH TWICE A DAY 05/05/24   Verlin Lonni BIRCH, MD    Physical Exam    Vital Signs:  ANJENETTE GERBINO does not have vital signs available for review today.  Given telephonic nature of communication, physical exam is limited. AAOx3. NAD. Normal affect.  Speech and respirations are unlabored.  Accessory Clinical Findings    None  Assessment & Plan    1.  Preoperative Cardiovascular Risk Assessment: According to the Revised Cardiac Risk Index (RCRI), her Perioperative Risk of Major Cardiac Event is (%): 0.9. Her Functional Capacity in METs is: 5.19 according to the Duke Activity Status Index (DASI). The patient is doing well from a cardiac perspective. Therefore, based on ACC/AHA guidelines, the patient would be at acceptable risk for the planned procedure without  further cardiovascular testing.   The patient was advised that if she develops new symptoms prior to surgery to contact our office to arrange for a follow-up visit, and she verbalized understanding.  Per office protocol, she may hold Eliquis  for 3 days prior to procedure and should resume as soon as hemodynamically stable post op.   A copy of this note will be routed to requesting surgeon.  Time:   Today, I have spent 10 minutes with the patient with telehealth technology discussing medical history, symptoms, and management plan.     Rosaline EMERSON Bane, NP-C  09/15/2024, 2:01 PM [address] [place], [place] [address] Office [phone] Fax [phone]

## 2024-09-16 MED ORDER — TORSEMIDE 20 MG PO TABS: 20.0000 mg | ORAL_TABLET | Freq: Two times a day (BID) | ORAL | 2 refills | Status: AC

## 2024-09-22 NOTE — Progress Notes (Signed)
 Anesthesia Chart Review   Case: [id number] Date/Time: 09/23/24 1215   Procedure: ARTHROPLASTY, KNEE, TOTAL (Right: Knee)   Anesthesia type: Spinal   Diagnosis: Osteoarthritis of right knee, unspecified osteoarthritis type [M17.11]   Pre-op diagnosis: Right knee osteoarthritis   [facility] ROOM 09 / [facility]   Surgeons: Ernie Cough, MD       DISCUSSION:83 y.o. never smoker with h/o HTN, persistent atrial fibrillation, LBBB, s/p TAVR 2023, non obstructive CAD on cath 05/2020, DM II, CKD Stage III, right knee OA scheduled for above procedure 09/23/2024 with Dr. Cough Ernie.   Creatinine mildly elevated, forwarded to PCP and surgeon. Advised to hold NSAIDS and increase fluids, recheck labs DOS.   Per cardiology preoperative evaluation 09/15/2024,  Preoperative Cardiovascular Risk Assessment: According to the Revised Cardiac Risk Index (RCRI), her Perioperative Risk of Major Cardiac Event is (%): 0.9. Her Functional Capacity in METs is: 5.19 according to the Duke Activity Status Index (DASI). The patient is doing well from a cardiac perspective. Therefore, based on ACC/AHA guidelines, the patient would be at acceptable risk for the planned procedure without further cardiovascular testing.    The patient was advised that if she develops new symptoms prior to surgery to contact our office to arrange for a follow-up visit, and she verbalized understanding.   Per office protocol, she may hold Eliquis  for 3 days prior to procedure and should resume as soon as hemodynamically stable post op.   VS: BP (!) 155/47   Pulse (!) 45   Temp 36.7 C (Oral)   Resp 16   Ht 5' 6 (1.676 m)   SpO2 96%   BMI 40.06 kg/m   PROVIDERS: Patient, No Pcp Per   LABS: Labs reviewed: Acceptable for surgery. (all labs ordered are listed, but only abnormal results are displayed)  Labs Reviewed  SURGICAL PCR SCREEN - Abnormal; Notable for the following components:      Result Value   Staphylococcus aureus  POSITIVE (*)    All other components within normal limits  BASIC METABOLIC PANEL WITH GFR - Abnormal; Notable for the following components:   Glucose, Bld 105 (*)    BUN 30 (*)    Creatinine, Ser 1.70 (*)    GFR, Estimated 30 (*)    All other components within normal limits  HEMOGLOBIN A1C  CBC  GLUCOSE, CAPILLARY     IMAGES:   EKG:   CV: Echo 11/26/2023  1. Left ventricular ejection fraction, by estimation, is 55%. The left  ventricle has normal function. The left ventricle has no regional wall  motion abnormalities. Left ventricular diastolic parameters are consistent  with Grade I diastolic dysfunction  (impaired relaxation).   2. Right ventricular systolic function is normal. The right ventricular  size is normal. There is mildly elevated pulmonary artery systolic  pressure. The estimated right ventricular systolic pressure is 36.4 mmHg.   3. Left atrial size was moderately dilated.   4. The mitral valve is degenerative. Mild to moderate mitral valve  regurgitation. Mild to moderate mitral stenosis. The mean mitral valve  gradient is 4.6 mmHg, mean gradient 1.84 cm^2 by VTI. Moderate mitral  annular calcification.   5. The aortic valve has been repaired/replaced. Eccentric peri-valvular  aortic regurgitation is moderate-severe. Holodiastolic flow reversal in  the descending thoracic aorta is not noted. There is a 29 mm Medtronic  CoreValve-EvolutR prosthetic (TAVR)  valve present in the aortic position. Procedure Date: 11/28/2022. Mean  gradient 16 mmHg (prior 12 mmHg) with EOA  1.68 cm^2. DI 0.4.   6. The inferior vena cava is normal in size with greater than 50%  respiratory variability, suggesting right atrial pressure of 3 mmHg.  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 Tortuous cororany arteries.   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 Verlin & Dr. Murriel.    Past Surgical History:  Procedure Laterality Date   ABDOMINAL HYSTERECTOMY  1992   complete   BILATERAL CARPAL TUNNEL RELEASE  yrs ago   CARDIOVERSION N/A 09/23/2020   Procedure: CARDIOVERSION;  Surgeon: Hobart Powell BRAVO, MD;  Location[facility] ENDOSCOPY;  Service: Cardiovascular;  Laterality: N/A;   CARDIOVERSION N/A 03/21/2021   Procedure: CARDIOVERSION;  Surgeon: Santo Stanly LABOR, MD;  Location: [facility] ENDOSCOPY;  Service: Cardiovascular;  Laterality: N/A;   CHOLECYSTECTOMY  1996   CYSTOSCOPY/URETEROSCOPY/HOLMIUM LASER/STENT PLACEMENT Bilateral 08/19/2020   Procedure: CYSTOSCOPY BILATERAL  RETROGRADE  URETEROSCOPY/HOLMIUM LASER/STENT PLACEMENT;  Surgeon: Watt Rush, MD;  Location: [facility];  Service: Urology;  Laterality: Bilateral;   HAMMER TOE SURGERY Right yrs ago   INTRAOPERATIVE TRANSTHORACIC ECHOCARDIOGRAM N/A 11/28/2022   Procedure: INTRAOPERATIVE TRANSTHORACIC ECHOCARDIOGRAM;  Surgeon: Verlin Lonni BIRCH, MD;  Location: [facility] OR;  Service: Open Heart Surgery;  Laterality: N/A;   IR RADIOLOGIST EVAL & MGMT  10/26/2020   IR RADIOLOGIST EVAL & MGMT  01/06/2021   IR RADIOLOGIST EVAL & MGMT  03/22/2021   IR RADIOLOGIST EVAL & MGMT  07/06/2021   IR RADIOLOGIST EVAL & MGMT  01/11/2022   IR RADIOLOGIST EVAL & MGMT  01/15/2023   IR RADIOLOGIST EVAL & MGMT  01/25/2024   LEFT HEART CATH AND CORONARY  ANGIOGRAPHY N/A 06/02/2020   Procedure: LEFT HEART CATH AND CORONARY ANGIOGRAPHY;  Surgeon: Dann Candyce RAMAN, MD;  Location: [facility] INVASIVE CV LAB;  Service: Cardiovascular;  Laterality: N/A;   RADIOLOGY WITH ANESTHESIA N/A 11/17/2020   Procedure: RADIOLOGY WITH ANESTHESIA RENAL MICROWAVE ABLATION;  Surgeon: Jennefer Ester PARAS, MD;  Location: [facility];  Service: Radiology;  Laterality: N/A;   TONSILLECTOMY  age 33   TRANSCATHETER AORTIC VALVE REPLACEMENT, TRANSFEMORAL N/A 11/28/2022   Procedure: Transcatheter Aortic Valve Replacement, Transfemoral using a Medtronic 29 mm EVOLUT FX Aortic Valve;  Surgeon: Verlin Lonni BIRCH, MD;  Location: [facility] OR;  Service: Open Heart Surgery;  Laterality: N/A;    MEDICATIONS:  acetaminophen  (TYLENOL ) 500 MG tablet   amiodarone  (PACERONE ) 200 MG tablet   amoxicillin  (AMOXIL ) 500 MG capsule   Calcium  Carb-Cholecalciferol (CALCIUM  600+D) 600-800 MG-UNIT TABS   carboxymethylcellulose (REFRESH PLUS) 0.5 % SOLN   ELIQUIS  5 MG TABS tablet   Hydrocortisone -Aloe Vera (CORTIZONE-10/ALOE EX)  Liniments (BLUE-EMU SUPER STRENGTH) CREA   Menthol (HALLS COUGH DROPS MT)   metoprolol  succinate (TOPROL -XL) 25 MG 24 hr tablet   Multiple Vitamins-Minerals (ONE A DAY WOMEN 50 PLUS PO)   Multiple Vitamins-Minerals (PRESERVISION AREDS 2 PO)   Phenol (CHLORASEPTIC MT)   Potassium Chloride  ER 20 MEQ TBCR   torsemide  (DEMADEX ) 20 MG tablet   No current facility-administered medications for this encounter.     Harlene Hoots Ward, PA-C [facility]Surgical Testing [phone]

## 2024-09-23 ENCOUNTER — Encounter (HOSPITAL_COMMUNITY): Admission: RE | Disposition: A | Payer: Self-pay | Source: Home / Self Care | Attending: Orthopedic Surgery

## 2024-09-23 ENCOUNTER — Ambulatory Visit (HOSPITAL_COMMUNITY): Payer: Self-pay | Admitting: Physician Assistant

## 2024-09-23 ENCOUNTER — Other Ambulatory Visit: Payer: Self-pay

## 2024-09-23 ENCOUNTER — Ambulatory Visit (HOSPITAL_COMMUNITY): Admitting: Certified Registered"

## 2024-09-23 ENCOUNTER — Observation Stay (HOSPITAL_COMMUNITY)
Admission: RE | Admit: 2024-09-23 | Discharge: 2024-09-24 | Disposition: A | Discharge status: Home / Self Care | Attending: Orthopedic Surgery | Admitting: Orthopedic Surgery

## 2024-09-23 ENCOUNTER — Encounter (HOSPITAL_COMMUNITY): Payer: Self-pay | Admitting: Orthopedic Surgery

## 2024-09-23 DIAGNOSIS — I5031 Acute diastolic (congestive) heart failure: Secondary | ICD-10-CM | POA: Insufficient documentation

## 2024-09-23 DIAGNOSIS — M1711 Unilateral primary osteoarthritis, right knee: Secondary | ICD-10-CM

## 2024-09-23 DIAGNOSIS — I110 Hypertensive heart disease with heart failure: Secondary | ICD-10-CM

## 2024-09-23 DIAGNOSIS — Z85528 Personal history of other malignant neoplasm of kidney: Secondary | ICD-10-CM | POA: Diagnosis not present

## 2024-09-23 DIAGNOSIS — I2510 Atherosclerotic heart disease of native coronary artery without angina pectoris: Secondary | ICD-10-CM

## 2024-09-23 DIAGNOSIS — G8918 Other acute postprocedural pain: Secondary | ICD-10-CM | POA: Diagnosis not present

## 2024-09-23 DIAGNOSIS — Z7901 Long term (current) use of anticoagulants: Secondary | ICD-10-CM | POA: Insufficient documentation

## 2024-09-23 DIAGNOSIS — E119 Type 2 diabetes mellitus without complications: Secondary | ICD-10-CM | POA: Diagnosis not present

## 2024-09-23 DIAGNOSIS — Z96651 Presence of right artificial knee joint: Principal | ICD-10-CM

## 2024-09-23 DIAGNOSIS — I4891 Unspecified atrial fibrillation: Secondary | ICD-10-CM | POA: Insufficient documentation

## 2024-09-23 DIAGNOSIS — M25561 Pain in right knee: Secondary | ICD-10-CM | POA: Diagnosis present

## 2024-09-23 HISTORY — PX: TOTAL KNEE ARTHROPLASTY: SHX125

## 2024-09-23 LAB — GLUCOSE, CAPILLARY
Glucose-Capillary: 111 mg/dL — ABNORMAL HIGH (ref 70–99)
Glucose-Capillary: 106 mg/dL — ABNORMAL HIGH (ref 70–99)

## 2024-09-23 SURGERY — ARTHROPLASTY, KNEE, TOTAL
Anesthesia: Spinal | Site: Knee | Laterality: Right

## 2024-09-23 MED ORDER — PHENOL 1.4 % MT LIQD: 1.0000 | OROMUCOSAL | Status: DC | PRN

## 2024-09-23 MED ORDER — TORSEMIDE 20 MG PO TABS
20.0000 mg | ORAL_TABLET | Freq: Two times a day (BID) | ORAL | Status: DC
  Administered 2024-09-24: 20 mg via ORAL
  Filled 2024-09-23: qty 1

## 2024-09-23 MED ORDER — ONDANSETRON HCL 4 MG/2ML IJ SOLN: 4.0000 mg | Freq: Once | INTRAMUSCULAR | Status: DC | PRN

## 2024-09-23 MED ORDER — OXYCODONE HCL 5 MG PO TABS
5.0000 mg | ORAL_TABLET | ORAL | Status: DC | PRN
  Administered 2024-09-23: 10 mg via ORAL
  Administered 2024-09-24 (×2): 5 mg via ORAL
  Administered 2024-09-24: 10 mg via ORAL
  Filled 2024-09-23: qty 1
  Filled 2024-09-23 (×3): qty 2

## 2024-09-23 MED ORDER — CHLORHEXIDINE GLUCONATE 0.12 % MT SOLN
15.0000 mL | Freq: Once | OROMUCOSAL | Status: AC
  Administered 2024-09-23: 15 mL via OROMUCOSAL

## 2024-09-23 MED ORDER — 0.9 % SODIUM CHLORIDE (POUR BTL) OPTIME
TOPICAL | Status: DC | PRN
  Administered 2024-09-23: 1000 mL

## 2024-09-23 MED ORDER — MEPIVACAINE HCL (PF) 2 % IJ SOLN
INTRAMUSCULAR | Status: DC | PRN
  Administered 2024-09-23: 3 mL via INTRATHECAL

## 2024-09-23 MED ORDER — SODIUM CHLORIDE (PF) 0.9 % IJ SOLN
INTRAMUSCULAR | Status: DC | PRN
  Administered 2024-09-23: 61 mL

## 2024-09-23 MED ORDER — METOPROLOL SUCCINATE ER 25 MG PO TB24
25.0000 mg | ORAL_TABLET | Freq: Every day | ORAL | Status: DC
  Filled 2024-09-23: qty 1

## 2024-09-23 MED ORDER — POTASSIUM CHLORIDE CRYS ER 20 MEQ PO TBCR
40.0000 meq | EXTENDED_RELEASE_TABLET | Freq: Every day | ORAL | Status: DC
  Administered 2024-09-24: 40 meq via ORAL
  Filled 2024-09-23: qty 2

## 2024-09-23 MED ORDER — BISACODYL 10 MG RE SUPP: 10.0000 mg | Freq: Every day | RECTAL | Status: DC | PRN

## 2024-09-23 MED ORDER — FENTANYL CITRATE PF 50 MCG/ML IJ SOSY
50.0000 ug | PREFILLED_SYRINGE | INTRAMUSCULAR | Status: DC
  Administered 2024-09-23: 50 ug via INTRAVENOUS
  Filled 2024-09-23: qty 2

## 2024-09-23 MED ORDER — LACTATED RINGERS IV SOLN: INTRAVENOUS | Status: DC

## 2024-09-23 MED ORDER — DEXAMETHASONE SODIUM PHOSPHATE 10 MG/ML IJ SOLN
10.0000 mg | Freq: Once | INTRAMUSCULAR | Status: AC
  Administered 2024-09-24: 10 mg via INTRAVENOUS
  Filled 2024-09-23: qty 1

## 2024-09-23 MED ORDER — GLYCOPYRROLATE 0.2 MG/ML IJ SOLN
INTRAMUSCULAR | Status: AC
  Filled 2024-09-23: qty 1

## 2024-09-23 MED ORDER — ALUM & MAG HYDROXIDE-SIMETH 200-200-20 MG/5ML PO SUSP: 30.0000 mL | ORAL | Status: DC | PRN

## 2024-09-23 MED ORDER — MIDAZOLAM HCL 2 MG/2ML IJ SOLN: 1.0000 mg | INTRAMUSCULAR | Status: DC

## 2024-09-23 MED ORDER — STERILE WATER FOR IRRIGATION IR SOLN
Status: DC | PRN
  Administered 2024-09-23: 2000 mL

## 2024-09-23 MED ORDER — PHENYLEPHRINE 80 MCG/ML (10ML) SYRINGE FOR IV PUSH (FOR BLOOD PRESSURE SUPPORT)
PREFILLED_SYRINGE | INTRAVENOUS | Status: AC
  Filled 2024-09-23: qty 10

## 2024-09-23 MED ORDER — TRANEXAMIC ACID-NACL 1000-0.7 MG/100ML-% IV SOLN
1000.0000 mg | Freq: Once | INTRAVENOUS | Status: AC
  Administered 2024-09-23: 1000 mg via INTRAVENOUS
  Filled 2024-09-23: qty 100

## 2024-09-23 MED ORDER — BUPIVACAINE-EPINEPHRINE (PF) 0.5% -1:200000 IJ SOLN
INTRAMUSCULAR | Status: DC | PRN
  Administered 2024-09-23: 15 mL via PERINEURAL

## 2024-09-23 MED ORDER — CEFAZOLIN SODIUM-DEXTROSE 2-4 GM/100ML-% IV SOLN
2.0000 g | Freq: Four times a day (QID) | INTRAVENOUS | Status: AC
  Administered 2024-09-23 (×2): 2 g via INTRAVENOUS
  Filled 2024-09-23 (×2): qty 100

## 2024-09-23 MED ORDER — ACETAMINOPHEN 500 MG PO TABS
1000.0000 mg | ORAL_TABLET | Freq: Four times a day (QID) | ORAL | Status: AC
  Administered 2024-09-23 – 2024-09-24 (×4): 1000 mg via ORAL
  Filled 2024-09-23 (×4): qty 2

## 2024-09-23 MED ORDER — PHENYLEPHRINE HCL-NACL 20-0.9 MG/250ML-% IV SOLN
INTRAVENOUS | Status: DC | PRN
  Administered 2024-09-23: 15 ug/min via INTRAVENOUS

## 2024-09-23 MED ORDER — POLYETHYLENE GLYCOL 3350 17 G PO PACK
17.0000 g | PACK | Freq: Two times a day (BID) | ORAL | Status: DC
  Administered 2024-09-23: 17 g via ORAL
  Filled 2024-09-23 (×2): qty 1

## 2024-09-23 MED ORDER — EPHEDRINE 5 MG/ML INJ
INTRAVENOUS | Status: AC
Start: 2024-09-23 — End: 2024-09-23
  Filled 2024-09-23: qty 5

## 2024-09-23 MED ORDER — ONDANSETRON HCL 4 MG/2ML IJ SOLN
INTRAMUSCULAR | Status: DC | PRN
  Administered 2024-09-23: 4 mg via INTRAVENOUS

## 2024-09-23 MED ORDER — METOCLOPRAMIDE HCL 5 MG/ML IJ SOLN: 5.0000 mg | Freq: Three times a day (TID) | INTRAMUSCULAR | Status: DC | PRN

## 2024-09-23 MED ORDER — OXYCODONE HCL 5 MG/5ML PO SOLN: 5.0000 mg | Freq: Once | ORAL | Status: DC | PRN

## 2024-09-23 MED ORDER — DEXAMETHASONE SODIUM PHOSPHATE 10 MG/ML IJ SOLN
8.0000 mg | Freq: Once | INTRAMUSCULAR | Status: AC
  Administered 2024-09-23: 8 mg via INTRAVENOUS

## 2024-09-23 MED ORDER — MEPIVACAINE HCL (PF) 2 % IJ SOLN
INTRAMUSCULAR | Status: AC
  Filled 2024-09-23: qty 20

## 2024-09-23 MED ORDER — ONDANSETRON HCL 4 MG PO TABS: 4.0000 mg | ORAL_TABLET | Freq: Four times a day (QID) | ORAL | Status: DC | PRN

## 2024-09-23 MED ORDER — POVIDONE-IODINE 10 % EX SWAB: 2.0000 | Freq: Once | CUTANEOUS | Status: DC

## 2024-09-23 MED ORDER — PHENYLEPHRINE 80 MCG/ML (10ML) SYRINGE FOR IV PUSH (FOR BLOOD PRESSURE SUPPORT)
PREFILLED_SYRINGE | INTRAVENOUS | Status: DC | PRN
  Administered 2024-09-23: 160 ug via INTRAVENOUS
  Administered 2024-09-23: 40 ug via INTRAVENOUS

## 2024-09-23 MED ORDER — OXYCODONE HCL 5 MG PO TABS: 10.0000 mg | ORAL_TABLET | ORAL | Status: DC | PRN

## 2024-09-23 MED ORDER — SENNA 8.6 MG PO TABS
2.0000 | ORAL_TABLET | Freq: Every day | ORAL | Status: DC
  Administered 2024-09-23: 17.2 mg via ORAL
  Filled 2024-09-23: qty 2

## 2024-09-23 MED ORDER — BUPIVACAINE-EPINEPHRINE (PF) 0.25% -1:200000 IJ SOLN
INTRAMUSCULAR | Status: AC
  Filled 2024-09-23: qty 30

## 2024-09-23 MED ORDER — ONDANSETRON HCL 4 MG/2ML IJ SOLN
INTRAMUSCULAR | Status: AC
  Filled 2024-09-23: qty 2

## 2024-09-23 MED ORDER — DIPHENHYDRAMINE HCL 12.5 MG/5ML PO ELIX: 12.5000 mg | ORAL_SOLUTION | ORAL | Status: DC | PRN

## 2024-09-23 MED ORDER — MUPIROCIN 2 % EX OINT: 1.0000 | TOPICAL_OINTMENT | Freq: Two times a day (BID) | CUTANEOUS | 0 refills | Status: AC

## 2024-09-23 MED ORDER — ORAL CARE MOUTH RINSE
15.0000 mL | Freq: Once | OROMUCOSAL | Status: AC
Start: 2024-09-23 — End: 2024-09-23

## 2024-09-23 MED ORDER — METOCLOPRAMIDE HCL 5 MG PO TABS: 5.0000 mg | ORAL_TABLET | Freq: Three times a day (TID) | ORAL | Status: DC | PRN

## 2024-09-23 MED ORDER — HYDROMORPHONE HCL 1 MG/ML IJ SOLN: 0.5000 mg | INTRAMUSCULAR | Status: DC | PRN

## 2024-09-23 MED ORDER — MENTHOL 3 MG MT LOZG: 1.0000 | LOZENGE | OROMUCOSAL | Status: DC | PRN

## 2024-09-23 MED ORDER — METHOCARBAMOL 500 MG PO TABS: 500.0000 mg | ORAL_TABLET | Freq: Four times a day (QID) | ORAL | Status: DC | PRN

## 2024-09-23 MED ORDER — PROPOFOL 10 MG/ML IV BOLUS
INTRAVENOUS | Status: DC | PRN
  Administered 2024-09-23: 30 mg via INTRAVENOUS
  Administered 2024-09-23: 75 ug/kg/min via INTRAVENOUS
  Administered 2024-09-23: 20 mg via INTRAVENOUS

## 2024-09-23 MED ORDER — DEXAMETHASONE SODIUM PHOSPHATE 10 MG/ML IJ SOLN
INTRAMUSCULAR | Status: AC
  Filled 2024-09-23: qty 1

## 2024-09-23 MED ORDER — AMIODARONE HCL 200 MG PO TABS
200.0000 mg | ORAL_TABLET | Freq: Every day | ORAL | Status: DC
  Administered 2024-09-24: 200 mg via ORAL
  Filled 2024-09-23: qty 1

## 2024-09-23 MED ORDER — CEFAZOLIN SODIUM-DEXTROSE 2-4 GM/100ML-% IV SOLN
2.0000 g | INTRAVENOUS | Status: AC
  Administered 2024-09-23: 2 g via INTRAVENOUS
  Filled 2024-09-23: qty 100

## 2024-09-23 MED ORDER — INSULIN ASPART 100 UNIT/ML IJ SOLN: 0.0000 [IU] | INTRAMUSCULAR | Status: DC | PRN

## 2024-09-23 MED ORDER — ACETAMINOPHEN 10 MG/ML IV SOLN: 1000.0000 mg | Freq: Once | INTRAVENOUS | Status: DC | PRN

## 2024-09-23 MED ORDER — EPHEDRINE SULFATE-NACL 50-0.9 MG/10ML-% IV SOSY
PREFILLED_SYRINGE | INTRAVENOUS | Status: DC | PRN
  Administered 2024-09-23 (×3): 5 mg via INTRAVENOUS

## 2024-09-23 MED ORDER — SODIUM CHLORIDE (PF) 0.9 % IJ SOLN
INTRAMUSCULAR | Status: AC
  Filled 2024-09-23: qty 30

## 2024-09-23 MED ORDER — SODIUM CHLORIDE 0.9 % IV SOLN: INTRAVENOUS | Status: DC

## 2024-09-23 MED ORDER — METHOCARBAMOL 1000 MG/10ML IJ SOLN: 500.0000 mg | Freq: Four times a day (QID) | INTRAMUSCULAR | Status: DC | PRN

## 2024-09-23 MED ORDER — GLYCOPYRROLATE 0.2 MG/ML IJ SOLN
INTRAMUSCULAR | Status: DC | PRN
  Administered 2024-09-23 (×2): .1 mg via INTRAVENOUS

## 2024-09-23 MED ORDER — TRANEXAMIC ACID-NACL 1000-0.7 MG/100ML-% IV SOLN
1000.0000 mg | INTRAVENOUS | Status: AC
  Administered 2024-09-23: 1000 mg via INTRAVENOUS
  Filled 2024-09-23: qty 100

## 2024-09-23 MED ORDER — OXYCODONE HCL 5 MG PO TABS: 5.0000 mg | ORAL_TABLET | Freq: Once | ORAL | Status: DC | PRN

## 2024-09-23 MED ORDER — KETOROLAC TROMETHAMINE 30 MG/ML IJ SOLN
INTRAMUSCULAR | Status: AC
  Filled 2024-09-23: qty 1

## 2024-09-23 MED ORDER — ONDANSETRON HCL 4 MG/2ML IJ SOLN: 4.0000 mg | Freq: Four times a day (QID) | INTRAMUSCULAR | Status: DC | PRN

## 2024-09-23 MED ORDER — FENTANYL CITRATE PF 50 MCG/ML IJ SOSY
PREFILLED_SYRINGE | INTRAMUSCULAR | Status: AC
  Filled 2024-09-23: qty 2

## 2024-09-23 MED ORDER — FENTANYL CITRATE PF 50 MCG/ML IJ SOSY
25.0000 ug | PREFILLED_SYRINGE | INTRAMUSCULAR | Status: DC | PRN
  Administered 2024-09-23 (×2): 50 ug via INTRAVENOUS

## 2024-09-23 MED ORDER — SODIUM CHLORIDE 0.9 % IR SOLN
Status: DC | PRN
  Administered 2024-09-23: 1000 mL

## 2024-09-23 MED ORDER — CHLORHEXIDINE GLUCONATE 4 % EX SOLN: 1.0000 | CUTANEOUS | 1 refills | Status: AC

## 2024-09-23 MED ORDER — APIXABAN 5 MG PO TABS
5.0000 mg | ORAL_TABLET | Freq: Two times a day (BID) | ORAL | Status: DC
  Administered 2024-09-24: 5 mg via ORAL
  Filled 2024-09-23: qty 1

## 2024-09-23 MED ORDER — MEPIVACAINE HCL (PF) 2 % IJ SOLN: INTRAMUSCULAR | Status: DC | PRN

## 2024-09-23 SURGICAL SUPPLY — 48 items
ATTUNE MED ANAT PAT 35 KNEE (Knees) IMPLANT
ATTUNE PSFEM RTSZ5 NARCEM KNEE (Femur) IMPLANT
ATTUNE PSRP INSR SZ5 8 KNEE (Insert) IMPLANT
BAG COUNTER SPONGE SURGICOUNT (BAG) IMPLANT
BAG ZIPLOCK 12X15 (MISCELLANEOUS) ×1 IMPLANT
BASE TIBIA ATTUNE KNEE SYS SZ6 (Knees) IMPLANT
BLADE SAW SGTL 11.0X1.19X90.0M (BLADE) IMPLANT
BLADE SAW SGTL 13.0X1.19X90.0M (BLADE) ×1 IMPLANT
BNDG ELASTIC 6INX 5YD STR LF (GAUZE/BANDAGES/DRESSINGS) ×1 IMPLANT
BOWL SMART MIX CTS (DISPOSABLE) ×1 IMPLANT
CEMENT HV SMART SET (Cement) ×2 IMPLANT
COVER SURGICAL LIGHT HANDLE (MISCELLANEOUS) ×1 IMPLANT
CUFF TRNQT CYL 34X4.125X (TOURNIQUET CUFF) ×1 IMPLANT
DERMABOND ADVANCED .7 DNX12 (GAUZE/BANDAGES/DRESSINGS) ×1 IMPLANT
DRAPE U-SHAPE 47X51 STRL (DRAPES) ×1 IMPLANT
DRESSING AQUACEL AG SP 3.5X10 (GAUZE/BANDAGES/DRESSINGS) ×1 IMPLANT
DRSG AQUACEL AG ADV 3.5X10 (GAUZE/BANDAGES/DRESSINGS) IMPLANT
DRSG AQUACEL AG ADV 3.5X14 (GAUZE/BANDAGES/DRESSINGS) IMPLANT
DURAPREP 26ML APPLICATOR (WOUND CARE) ×2 IMPLANT
ELECT REM PT RETURN 15FT ADLT (MISCELLANEOUS) ×1 IMPLANT
GLOVE BIO SURGEON STRL SZ 6 (GLOVE) ×1 IMPLANT
GLOVE BIOGEL PI IND STRL 6.5 (GLOVE) ×1 IMPLANT
GLOVE BIOGEL PI IND STRL 7.5 (GLOVE) ×1 IMPLANT
GLOVE ORTHO TXT STRL SZ7.5 (GLOVE) ×2 IMPLANT
GOWN STRL REUS W/ TWL LRG LVL3 (GOWN DISPOSABLE) ×2 IMPLANT
HOLDER FOLEY CATH W/STRAP (MISCELLANEOUS) IMPLANT
KIT TURNOVER KIT A (KITS) ×1 IMPLANT
MANIFOLD NEPTUNE II (INSTRUMENTS) ×1 IMPLANT
NDL SAFETY ECLIPSE 18X1.5 (NEEDLE) IMPLANT
NS IRRIG 1000ML POUR BTL (IV SOLUTION) ×1 IMPLANT
PACK TOTAL KNEE CUSTOM (KITS) ×1 IMPLANT
PENCIL SMOKE EVACUATOR (MISCELLANEOUS) ×1 IMPLANT
PIN FIX SIGMA LCS THRD HI (PIN) IMPLANT
PROTECTOR NERVE ULNAR (MISCELLANEOUS) ×1 IMPLANT
SET HNDPC FAN SPRY TIP SCT (DISPOSABLE) ×1 IMPLANT
SET PAD KNEE POSITIONER (MISCELLANEOUS) ×1 IMPLANT
SPIKE FLUID TRANSFER (MISCELLANEOUS) ×2 IMPLANT
SUT MNCRL AB 4-0 PS2 18 (SUTURE) ×1 IMPLANT
SUT STRATAFIX PDS+ 0 24IN (SUTURE) ×1 IMPLANT
SUT VIC AB 1 CT1 36 (SUTURE) ×1 IMPLANT
SUT VIC AB 2-0 CT1 TAPERPNT 27 (SUTURE) ×2 IMPLANT
SYR 3ML LL SCALE MARK (SYRINGE) ×1 IMPLANT
TOWEL GREEN STERILE FF (TOWEL DISPOSABLE) ×1 IMPLANT
TRAY FOLEY MTR SLVR 14FR STAT (SET/KITS/TRAYS/PACK) IMPLANT
TRAY FOLEY MTR SLVR 16FR STAT (SET/KITS/TRAYS/PACK) ×1 IMPLANT
TUBE SUCTION HIGH CAP CLEAR NV (SUCTIONS) ×1 IMPLANT
WATER STERILE IRR 1000ML POUR (IV SOLUTION) ×2 IMPLANT
WRAP KNEE MAXI GEL POST OP (GAUZE/BANDAGES/DRESSINGS) ×1 IMPLANT

## 2024-09-23 NOTE — Op Note (Signed)
 NAME:  [name] [name]                      MEDICAL RECORD NO.:  [id number]                             FACILITY:  [facility]      PHYSICIAN:  Donnice BIRCH. Ernie, M.D.  DATE OF BIRTH:  1941-08-01      DATE OF PROCEDURE:  09/23/2024                                     OPERATIVE REPORT         PREOPERATIVE DIAGNOSIS:  Right knee osteoarthritis.      POSTOPERATIVE DIAGNOSIS:  Right knee osteoarthritis.      FINDINGS:  The patient was noted to have complete loss of cartilage and   bone-on-bone arthritis with associated osteophytes in the lateral and patellofemoral compartments of   the knee with a significant synovitis and associated effusion.  The patient had failed months of conservative treatment including medications, injection therapy, activity modification.     PROCEDURE:  Right total knee replacement.      COMPONENTS USED:  DePuy Attune rotating platform posterior stabilized knee   system, a size 5N femur, 6 tibia, size 8 mm PS AOX insert, and 35 anatomic patellar   button.      SURGEON:  Donnice BIRCH. Ernie, M.D.      ASSISTANT:  Rosina Calin, PA-C.      ANESTHESIA:  Regional and Spinal.      SPECIMENS:  None.      COMPLICATION:  None.      DRAINS:  None.  EBL: <200 cc      TOURNIQUET TIME:  32 min at 225 mmHg   The patient was stable to the recovery room.      INDICATION FOR PROCEDURE:  [name] [name] is a 83 y.o. female patient of   mine.  The patient had been seen, evaluated, and treated for months conservatively in the   office with medication, activity modification, and injections.  The patient had   radiographic changes of bone-on-bone arthritis with endplate sclerosis and osteophytes noted.  Based on the radiographic changes and failed conservative measures, the patient   decided to proceed with definitive treatment, total knee replacement.  Risks of infection, DVT, component failure, need for revision surgery, neurovascular injury were reviewed in the office setting.  The  postop course was reviewed stressing the efforts to maximize post-operative satisfaction and function.  Consent was obtained for benefit of pain   relief.      PROCEDURE IN DETAIL:  The patient was brought to the operative theater.   Once adequate anesthesia, preoperative antibiotics, 2 gm of Ancef ,1 gm of Tranexamic Acid, and 10 mg of Decadron  administered, the patient was positioned supine with a right thigh tourniquet placed.  The  right lower extremity was prepped and draped in sterile fashion.  A time-   out was performed identifying the patient, planned procedure, and the appropriate extremity.      The right lower extremity was placed in the [facility] leg holder.  The leg was   exsanguinated, tourniquet elevated to 225 mmHg.  A midline incision was   made followed by median parapatellar arthrotomy.  Following initial   exposure, attention was first directed  to the patella.  Precut   measurement was noted to be 24 mm.  I resected down to 13 mm and used a   35 anatomic patellar button to restore patellar height as well as cover the cut surface.      The lug holes were drilled and a metal shim was placed to protect the   patella from retractors and saw blade during the procedure.      At this point, attention was now directed to the femur.  The femoral   canal was opened with a drill, irrigated to try to prevent fat emboli.  An   intramedullary rod was passed at 3 degrees valgus, 9 mm of bone was   resected off the distal femur.  Following this resection, the tibia was   subluxated anteriorly.  Using the extramedullary guide, 2 mm of bone was resected off   the proximal lateral tibia.  We confirmed the gap would be   stable medially and laterally with a size 5 spacer block as well as confirmed that the tibial cut was perpendicular in the coronal plane, checking with an alignment rod.      Once this was done, I sized the femur to be a size 5 in the anterior-   posterior dimension, chose a  narrow component based on medial and   lateral dimension.  The size 5 rotation block was then pinned in   position anterior referenced using the C-clamp to set rotation.  The   anterior, posterior, and  chamfer cuts were made without difficulty nor   notching making certain that I was along the anterior cortex to help   with flexion gap stability.      The final box cut was made off the lateral aspect of distal femur.      At this point, the tibia was sized to be a size 6.  The size 6 tray was   then pinned in position through the medial third of the tubercle,   drilled, and keel punched.  Trial reduction was now carried with a 5 femur,  6 tibia, a size 8 mm PS insert, and the 35 anatomic patella botton.  The knee was brought to full extension with good flexion stability with the patella   tracking through the trochlea without application of pressure.  Given   all these findings the trial components removed.  Final components were   opened and cement was mixed.  The knee was irrigated with normal saline solution and pulse lavage.  The synovial lining was   then injected with 30 cc of 0.25% Marcaine with epinephrine, 1 cc of Toradol and 30 cc of NS for a total of 61 cc.     Final implants were then cemented onto cleaned and dried cut surfaces of bone with the knee brought to extension with a size 8 mm PS trial insert.      Once the cement had fully cured, excess cement was removed   throughout the knee.  I confirmed that I was satisfied with the range of   motion and stability, and the final size 8 mm PS AOX insert was chosen.  It was   placed into the knee.      The tourniquet had been let down at 32 minutes.  No significant   hemostasis was required.  The extensor mechanism was then reapproximated using #1 Vicryl and #1 Stratafix sutures with the knee   in flexion.  The   remaining  wound was closed with 2-0 Vicryl and running 4-0 Monocryl.   The knee was cleaned, dried, dressed  sterilely using Dermabond and   Aquacel dressing.  The patient was then   brought to recovery room in stable condition, tolerating the procedure   well.   Please note that Physician Assistant, Rosina Calin, PA-C was present for the entirety of the case, and was utilized for pre-operative positioning, peri-operative retractor management, general facilitation of the procedure and for primary wound closure at the end of the case.              Donnice CORDOBA Ernie, M.D.    09/23/2024 12:34 PM

## 2024-09-23 NOTE — Anesthesia Preprocedure Evaluation (Addendum)
 Anesthesia Evaluation  Patient identified by MRN, date of birth, ID band Patient awake    Reviewed: Allergy & Precautions, NPO status , Patient's Chart, lab work & pertinent test results, reviewed documented beta blocker date and time   History of Anesthesia Complications Negative for: history of anesthetic complications  Airway Mallampati: II  TM Distance: >3 FB     Dental no notable dental hx.    Pulmonary shortness of breath and with exertion, pneumonia   breath sounds clear to auscultation       Cardiovascular hypertension, + CAD and +CHF  + dysrhythmias + Valvular Problems/Murmurs AI and MR  Rhythm:Regular Rate:Normal + Diastolic murmurs IMPRESSIONS     1. Left ventricular ejection fraction, by estimation, is 55%. The left  ventricle has normal function. The left ventricle has no regional wall  motion abnormalities. Left ventricular diastolic parameters are consistent  with Grade I diastolic dysfunction  (impaired relaxation).   2. Right ventricular systolic function is normal. The right ventricular  size is normal. There is mildly elevated pulmonary artery systolic  pressure. The estimated right ventricular systolic pressure is 36.4 mmHg.   3. Left atrial size was moderately dilated.   4. The mitral valve is degenerative. Mild to moderate mitral valve  regurgitation. Mild to moderate mitral stenosis. The mean mitral valve  gradient is 4.6 mmHg, mean gradient 1.84 cm^2 by VTI. Moderate mitral  annular calcification.   5. The aortic valve has been repaired/replaced. Eccentric peri-valvular  aortic regurgitation is moderate-severe. Holodiastolic flow reversal in  the descending thoracic aorta is not noted. There is a 29 mm Medtronic  CoreValve-EvolutR prosthetic (TAVR)  valve present in the aortic position. Procedure Date: 11/28/2022. Mean  gradient 16 mmHg (prior 12 mmHg) with EOA 1.68 cm^2. DI 0.4.   6. The inferior  vena cava is normal in size with greater than 50%  respiratory variability, suggesting right atrial pressure of 3 mmHg.      Neuro/Psych neg Seizures  Neuromuscular disease    GI/Hepatic ,,,(+) neg Cirrhosis        Endo/Other  diabetes    Renal/GU Renal disease     Musculoskeletal  (+) Arthritis , Osteoarthritis,    Abdominal   Peds  Hematology  (+) Blood dyscrasia, anemia   Anesthesia Other Findings   Reproductive/Obstetrics                              Anesthesia Physical Anesthesia Plan  ASA: 4  Anesthesia Plan: Spinal   Post-op Pain Management: Regional block*   Induction: Intravenous  PONV Risk Score and Plan: 2 and Ondansetron  and Propofol  infusion  Airway Management Planned: Natural Airway and Simple Face Mask  Additional Equipment:   Intra-op Plan:   Post-operative Plan:   Informed Consent: I have reviewed the patients History and Physical, chart, labs and discussed the procedure including the risks, benefits and alternatives for the proposed anesthesia with the patient or authorized representative who has indicated his/her understanding and acceptance.     Dental advisory given  Plan Discussed with: CRNA  Anesthesia Plan Comments:          Anesthesia Quick Evaluation

## 2024-09-23 NOTE — Discharge Instructions (Signed)

## 2024-09-23 NOTE — Transfer of Care (Addendum)
 Immediate Anesthesia Transfer of Care Note  Patient: [name] [name]  Procedure(s) Performed: ARTHROPLASTY, KNEE, TOTAL (Right: Knee)  Patient Location: PACU  Anesthesia Type:Spinal  Level of Consciousness: awake and patient cooperative  Airway & Oxygen Therapy: Patient Spontanous Breathing and Patient connected to face mask oxygen  Post-op Assessment: Report given to RN and Post -op Vital signs reviewed and stable  Post vital signs: Reviewed and stable  Last Vitals:  Vitals Value Taken Time  BP 126/65 09/23/24 14:17  Temp 36.4 09/23/24   14:17  Pulse 58 09/23/24 14:18  Resp 15 09/23/24 14:18  SpO2 100 % 09/23/24 14:18  Vitals shown include unfiled device data.  Last Pain:  Vitals:   09/23/24 1059  TempSrc:   PainSc: 0-No pain         Complications: No notable events documented.

## 2024-09-23 NOTE — H&P (Signed)
 TOTAL KNEE ADMISSION H&P  Patient is being admitted for right total knee arthroplasty.  Therapy Plans: outpatient therapy at [facility] Disposition: Home with husband Planned DVT Prophylaxis: Eliquis  5 mg BID DME needed: none PCP: recently retired Cardio: Dr. Verlin - will take form by office TXA: IV Allergies: NKDA Anesthesia Concerns: none BMI: 38.8 Last HgbA1c: 5.3%   Other: - staying overnight - oxycodone , robaxin ,tylenol  - Husband had both TKAs by Aluisio, daughter by Ernie - they all know the surgery well  Subjective:  Chief Complaint:right knee pain.  HPI: [name] [name], 83 y.o. female, has a history of pain and functional disability in the right knee due to arthritis and has failed non-surgical conservative treatments for greater than 12 weeks to includeNSAID's and/or analgesics, corticosteriod injections, and activity modification.  Onset of symptoms was gradual, starting 2 years ago with gradually worsening course since that time. The patient noted no past surgery on the right knee(s).  Patient currently rates pain in the right knee(s) at 8 out of 10 with activity. Patient has worsening of pain with activity and weight bearing and pain that interferes with activities of daily living.  Patient has evidence of joint space narrowing by imaging studies. There is no active infection.  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 Tortuous cororany arteries.   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 Verlin & Dr. Murriel.    Past Surgical History:  Procedure Laterality Date   ABDOMINAL HYSTERECTOMY  1992   complete   BILATERAL CARPAL TUNNEL RELEASE  yrs ago   CARDIOVERSION N/A 09/23/2020   Procedure: CARDIOVERSION;  Surgeon: Hobart Powell BRAVO, MD;  Location: [facility] ENDOSCOPY;  Service: Cardiovascular;  Laterality: N/A;   CARDIOVERSION N/A 03/21/2021   Procedure: CARDIOVERSION;  Surgeon: Santo Stanly LABOR, MD;  Location: [facility] ENDOSCOPY;  Service: Cardiovascular;  Laterality: N/A;   CHOLECYSTECTOMY  1996   CYSTOSCOPY/URETEROSCOPY/HOLMIUM LASER/STENT PLACEMENT Bilateral 08/19/2020   Procedure: CYSTOSCOPY BILATERAL  RETROGRADE  URETEROSCOPY/HOLMIUM LASER/STENT PLACEMENT;  Surgeon: Watt Rush, MD;  Location: [facility];  Service: Urology;  Laterality: Bilateral;  HAMMER TOE SURGERY Right yrs ago   INTRAOPERATIVE TRANSTHORACIC ECHOCARDIOGRAM N/A 11/28/2022   Procedure: INTRAOPERATIVE TRANSTHORACIC ECHOCARDIOGRAM;  Surgeon: Verlin Lonni BIRCH, MD;  Location: [facility] OR;  Service: Open Heart Surgery;  Laterality: N/A;   IR RADIOLOGIST EVAL & MGMT  10/26/2020   IR RADIOLOGIST EVAL & MGMT  01/06/2021   IR RADIOLOGIST EVAL & MGMT   03/22/2021   IR RADIOLOGIST EVAL & MGMT  07/06/2021   IR RADIOLOGIST EVAL & MGMT  01/11/2022   IR RADIOLOGIST EVAL & MGMT  01/15/2023   IR RADIOLOGIST EVAL & MGMT  01/25/2024   LEFT HEART CATH AND CORONARY ANGIOGRAPHY N/A 06/02/2020   Procedure: LEFT HEART CATH AND CORONARY ANGIOGRAPHY;  Surgeon: Dann Candyce RAMAN, MD;  Location: [facility] INVASIVE CV LAB;  Service: Cardiovascular;  Laterality: N/A;   RADIOLOGY WITH ANESTHESIA N/A 11/17/2020   Procedure: RADIOLOGY WITH ANESTHESIA RENAL MICROWAVE ABLATION;  Surgeon: Jennefer Ester PARAS, MD;  Location: [facility] ORS;  Service: Radiology;  Laterality: N/A;   TONSILLECTOMY  age 69   TRANSCATHETER AORTIC VALVE REPLACEMENT, TRANSFEMORAL N/A 11/28/2022   Procedure: Transcatheter Aortic Valve Replacement, Transfemoral using a Medtronic 29 mm EVOLUT FX Aortic Valve;  Surgeon: Verlin Lonni BIRCH, MD;  Location: [facility] OR;  Service: Open Heart Surgery;  Laterality: N/A;    No current facility-administered medications for this encounter.   Current Outpatient Medications  Medication Sig Dispense Refill Last Dose/Taking   acetaminophen  (TYLENOL ) 500 MG tablet Take 1,000 mg by mouth daily as needed for moderate pain or headache.   Taking As Needed   amiodarone  (PACERONE ) 200 MG tablet Take 1 tablet (200 mg total) by mouth daily. 90 tablet 3 Taking   amoxicillin  (AMOXIL ) 500 MG capsule Take 4 capsules (2,000 mg total) by mouth as directed. Take 4 tablets 1 hour prior to dental work, including cleanings. 12 capsule 1 Taking   Calcium  Carb-Cholecalciferol (CALCIUM  600+D) 600-800 MG-UNIT TABS Take 1 tablet by mouth every evening.    Taking   carboxymethylcellulose (REFRESH PLUS) 0.5 % SOLN Place 1 drop into both eyes 3 (three) times daily as needed (dry eyes).   Taking As Needed   ELIQUIS  5 MG TABS tablet TAKE 1 TABLET BY MOUTH TWICE A DAY 180 tablet 1 Taking   Hydrocortisone -Aloe Vera (CORTIZONE-10/ALOE EX) Apply 1 application topically 2 (two) times daily as needed (itching).    Taking As Needed   Liniments (BLUE-EMU SUPER STRENGTH) CREA Apply 1 application  topically daily as needed (arthritis).   Taking As Needed   Menthol (HALLS COUGH DROPS MT) Use as directed 1-2 each in the mouth or throat every Sunday.   Taking   metoprolol  succinate (TOPROL -XL) 25 MG 24 hr tablet Take 1 tablet (25 mg total) by mouth daily. 90 tablet 3 Taking   Multiple Vitamins-Minerals (ONE A DAY WOMEN 50 PLUS PO) Take 1 tablet by mouth every evening.   Taking   Multiple Vitamins-Minerals (PRESERVISION AREDS 2 PO) Take 1 capsule by mouth 2 (two) times daily.    Taking   Phenol (CHLORASEPTIC MT) Use as directed 1 spray in the mouth or throat at bedtime.   Taking   Potassium Chloride  ER 20 MEQ TBCR Take 2 tablets (40 mEq total) by mouth daily. 180 tablet 3 Taking   torsemide  (DEMADEX ) 20 MG tablet Take 1 tablet (20 mg total) by mouth 2 (two) times daily. 180 tablet 2    Allergies  Allergen Reactions   Tetanus-Diphtheria Toxoids Td Other (See Comments)    Felt very sick  Cardizem  [Diltiazem ] Rash    Social History   Tobacco Use   Smoking status: Never   Smokeless tobacco: Never  Substance Use Topics   Alcohol use: Never    Family History  Problem Relation Age of Onset   Hypertension Mother    Hypertension Father    Diabetes Paternal Grandmother      Review of Systems  Constitutional:  Negative for chills and fever.  Respiratory:  Negative for cough and shortness of breath.   Cardiovascular:  Negative for chest pain.  Gastrointestinal:  Negative for nausea and vomiting.  Musculoskeletal:  Positive for arthralgias.     Objective:  Physical Exam Bilateral knee exams: No palpable effusions, warmth erythema 5 degree flexion contractures Slight valgus right knee with varus left knee Tenderness at her joint lines Flexion to around 110 degrees with terminal tightness and crepitation No significant lower extremity edema, erythema or calf tenderness  Vital signs in last 24  hours:    Labs:   Estimated body mass index is 40.06 kg/m as calculated from the following:   Height as of 09/11/24: 5' 6 (1.676 m).   Weight as of 03/10/24: 112.6 kg.   Imaging Review Plain radiographs demonstrate severe degenerative joint disease of the right knee(s). The overall alignment isneutral. The bone quality appears to be adequate for age and reported activity level.      Assessment/Plan:  End stage arthritis, right knee   The patient history, physical examination, clinical judgment of the provider and imaging studies are consistent with end stage degenerative joint disease of the right knee(s) and total knee arthroplasty is deemed medically necessary. The treatment options including medical management, injection therapy arthroscopy and arthroplasty were discussed at length. The risks and benefits of total knee arthroplasty were presented and reviewed. The risks due to aseptic loosening, infection, stiffness, patella tracking problems, thromboembolic complications and other imponderables were discussed. The patient acknowledged the explanation, agreed to proceed with the plan and consent was signed. Patient is being admitted for inpatient treatment for surgery, pain control, PT, OT, prophylactic antibiotics, VTE prophylaxis, progressive ambulation and ADL's and discharge planning. The patient is planning to be discharged home.     Patient's anticipated LOS is less than 2 midnights, meeting these requirements: - Younger than 84 - Lives within 1 hour of care - Has a competent adult at home to recover with post-op recover - NO history of  - Chronic pain requiring opiods  - Diabetes  - Coronary Artery Disease  - Heart failure  - Heart attack  - Stroke  - DVT/VTE  - Cardiac arrhythmia  - Respiratory Failure/COPD  - Renal failure  - Anemia  - Advanced Liver disease  Rosina Calin, PA-C Orthopedic Surgery [facility] [place] [phone]

## 2024-09-23 NOTE — Anesthesia Procedure Notes (Signed)
 Anesthesia Regional Block: Adductor canal block   Pre-Anesthetic Checklist: , timeout performed,  Correct Patient, Correct Site, Correct Laterality,  Correct Procedure, Correct Position, site marked,  Risks and benefits discussed,  Surgical consent,  Pre-op evaluation,  At surgeon's request and post-op pain management  Laterality: Right  Prep: Maximum Sterile Barrier Precautions used, chloraprep       Needles:  Injection technique: Single-shot  Needle Type: Echogenic Needle      Needle Gauge: 20     Additional Needles:   Procedures:,,,, ultrasound used (permanent image in chart),,    Narrative:  Start time: 09/23/2024 12:10 PM End time: 09/23/2024 12:15 PM Injection made incrementally with aspirations every 5 mL.  Performed by: Personally  Anesthesiologist: Keneth Lynwood POUR, MD

## 2024-09-23 NOTE — Anesthesia Procedure Notes (Signed)
 Spinal  Patient location during procedure: OR Start time: 09/23/2024 12:30 PM End time: 09/23/2024 12:35 PM Reason for block: surgical anesthesia Staffing Anesthesiologist: Keneth Lynwood POUR, MD Performed by: Keneth Lynwood POUR, MD Authorized by: Keneth Lynwood POUR, MD   Preanesthetic Checklist Completed: patient identified, IV checked, site marked, risks and benefits discussed, surgical consent, monitors and equipment checked, pre-op evaluation and timeout performed Spinal Block Patient position: sitting Prep: DuraPrep Patient monitoring: heart rate, cardiac monitor, continuous pulse ox and blood pressure Approach: midline Location: L3-4 Injection technique: single-shot Needle Needle type: Sprotte  Needle gauge: 24 G Needle length: 9 cm Assessment Sensory level: T4 Events: CSF return

## 2024-09-23 NOTE — Anesthesia Procedure Notes (Signed)
 Date/Time: 09/23/2024 12:33 PM  Performed by: Para Jerelene CROME, CRNAOxygen Delivery Method: Simple face mask

## 2024-09-23 NOTE — Care Plan (Signed)
 Ortho Bundle Case Management Note  Patient Details  Name: [name] [name] MRN: [id number] Date of Birth: April 10, 1941  L TKA on 09/23/24  DCP: Home with husband  DME: No needs  PT: [facility]                  DME Arranged:  N/A DME Agency:  NA  HH Arranged:    HH Agency:     Additional Comments: Please contact me with any questions of if this plan should need to change.  Lyle Pepper, CCM [facility] [phone]   09/23/2024, 10:34 AM

## 2024-09-23 NOTE — Interval H&P Note (Signed)
 History and Physical Interval Note:  09/23/2024 11:20 AM  [name] [name]  has presented today for surgery, with the diagnosis of Right knee osteoarthritis.  The various methods of treatment have been discussed with the patient and family. After consideration of risks, benefits and other options for treatment, the patient has consented to  Procedure(s): ARTHROPLASTY, KNEE, TOTAL (Right) as a surgical intervention.  [name] history has been reviewed, patient examined, no change in status, stable for surgery.  I have reviewed the patient's chart and labs.  Questions were answered to the patient's satisfaction.     [name]

## 2024-09-23 NOTE — Evaluation (Signed)
 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 Pt reports that the R knee and the L knee can buckle at times.  Clinical Impression  Pt is s/p R TKA resulting in the deficits listed below (see PT Problem List). Pt will benefit from acute skilled PT to increase their independence and safety with mobility to allow discharge home with husband. Pt has most difficulty with sit to stand with powering up due to impairments of L knee as well. Will need to compete stair training and ambulation tomorrow.        If plan is discharge home, recommend the following: A little help with walking and/or transfers   Can travel by private vehicle        Equipment Recommendations None recommended by PT (pt has equipment at home)  Recommendations for Other Services       Functional Status Assessment Patient has had a recent decline in their functional status and demonstrates the ability to make significant improvements in function in a reasonable and predictable amount of time.     Precautions / Restrictions Precautions Precautions: Knee Precaution/Restrictions Comments: reviewed knee needs to be resting in extension position to prevent tightening in slight flexion. REviewed again with pt in the recliner placing a towel roll under her achilles to promote knee extension to neurtral Restrictions Weight Bearing Restrictions Per Provider Order: No      Mobility  Bed Mobility Overal bed mobility: Modified Independent             General bed mobility comments: assisted R LE very slightly    Transfers Overall transfer level: Needs assistance Equipment used: Rolling walker (2 wheels) Transfers: Sit to/from Stand Sit to Stand: Mod assist           General  transfer comment: pt had great challeneges with lift off for sit to stand and mentioned this was difficult prior to surgery because she relied on the R LE to take most of the strength for sit to stand . Bed had to be elevated for pt to stand with Mod A and and elevated the recliner with pillows.    Ambulation/Gait Ambulation/Gait assistance: Contact guard assist Gait Distance (Feet): 10 Feet Assistive device: Rolling walker (2 wheels) Gait Pattern/deviations: Step-to pattern       General Gait Details: limited distance due to POD0 and late in the evening pt wanting dinner. Feel pt will progress with walking well.  Stairs            Wheelchair Mobility     Tilt Bed    Modified Rankin (Stroke Patients Only)       Balance Overall balance assessment: No apparent balance deficits (not formally assessed)                                           Pertinent Vitals/Pain Pain Assessment Pain Assessment: 0-10 Pain Score: 1  Pain Location: R knee Pain Descriptors / Indicators: Sore Pain Intervention(s): Monitored during session    Home Living Family/patient expects to be discharged to:: Private residence Living Arrangements: Spouse/significant other (dtr available to help as well) Available Help at Discharge: Family Type of Home: House Home  Access: Stairs to enter Entrance Stairs-Rails: Right (rail on one set and a gait to pull on the other set) Entrance Stairs-Number of Steps: 3 + 3     Home Equipment: Agricultural consultant (2 wheels) Additional Comments: educated husband on how to adjust the RW to the correct hieight for the patient    Prior Function Prior Level of Function : Needs assist       Physical Assist : Mobility (physical) Mobility (physical): Transfers   Mobility Comments: was challenign for pt to get up and down from toilet prior to surgery and walkign was short distances.       Extremity/Trunk Assessment        Lower Extremity  Assessment Lower Extremity Assessment: RLE deficits/detail RLE Deficits / Details: knee extenision grossly 10 supine, and knee flexion AAROM to 70 degree . able to perform heels slides with minuimal assistance and SLR with minimal assistance as well RLE Sensation: WNL       Communication   Communication Communication: No apparent difficulties    Cognition Arousal: Alert Behavior During Therapy: WFL for tasks assessed/performed   PT - Cognitive impairments: No apparent impairments                         Following commands: Intact       Cueing Cueing Techniques: Verbal cues     General Comments      Exercises Total Joint Exercises Ankle Circles/Pumps: AROM, Both, 10 reps, Supine Quad Sets: AROM, Right, 10 reps Heel Slides: AAROM, 10 reps, Supine Straight Leg Raises: AAROM, Right, 10 reps, Supine Goniometric ROM: 10-70   Assessment/Plan    PT Assessment Patient needs continued PT services  PT Problem List Decreased strength;Decreased range of motion;Decreased activity tolerance;Decreased mobility       PT Treatment Interventions DME instruction;Gait training;Stair training;Functional mobility training;Therapeutic activities;Therapeutic exercise;Patient/family education    PT Goals (Current goals can be found in the Care Plan section)  Acute Rehab PT Goals Patient Stated Goal: I want to be able to walk without as much pain and further. I am getting my L knee done next PT Goal Formulation: With patient Time For Goal Achievement: 10/07/24 Potential to Achieve Goals: Good    Frequency 7X/week     Co-evaluation               AM-PAC PT 6 Clicks Mobility  Outcome Measure Help needed turning from your back to your side while in a flat bed without using bedrails?: A Little Help needed moving from lying on your back to sitting on the side of a flat bed without using bedrails?: A Little Help needed moving to and from a bed to a chair (including a  wheelchair)?: A Little Help needed standing up from a chair using your arms (e.g., wheelchair or bedside chair)?: A Lot Help needed to walk in hospital room?: A Little Help needed climbing 3-5 steps with a railing? : A Lot 6 Click Score: 16    End of Session Equipment Utilized During Treatment: Gait belt Activity Tolerance: Patient tolerated treatment well Patient left: in chair;with call bell/phone within reach;with chair alarm set;with family/visitor present Nurse Communication: Mobility status PT Visit Diagnosis: Other abnormalities of gait and mobility (R26.89)    Time: 8192-8154 PT Time Calculation (min) (ACUTE ONLY): 38 min   Charges:   PT Evaluation $PT Eval Low Complexity: 1 Low PT Treatments $Gait Training: 8-22 mins PT General Charges $$ ACUTE PT VISIT: 1 Visit  Cloretta, PT, MPT Acute Rehabilitation Services Office: [phone] If a weekend: secure chat groups: [facility] PT, [facility] OT, [facility] SLP 09/23/2024   Shenelle Klas 09/23/2024, 8:45 PM

## 2024-09-24 DIAGNOSIS — I4891 Unspecified atrial fibrillation: Secondary | ICD-10-CM | POA: Diagnosis not present

## 2024-09-24 DIAGNOSIS — Z7901 Long term (current) use of anticoagulants: Secondary | ICD-10-CM | POA: Diagnosis not present

## 2024-09-24 DIAGNOSIS — E119 Type 2 diabetes mellitus without complications: Secondary | ICD-10-CM | POA: Diagnosis not present

## 2024-09-24 DIAGNOSIS — I110 Hypertensive heart disease with heart failure: Secondary | ICD-10-CM | POA: Diagnosis not present

## 2024-09-24 DIAGNOSIS — M1711 Unilateral primary osteoarthritis, right knee: Secondary | ICD-10-CM | POA: Diagnosis not present

## 2024-09-24 DIAGNOSIS — I5031 Acute diastolic (congestive) heart failure: Secondary | ICD-10-CM | POA: Diagnosis not present

## 2024-09-24 DIAGNOSIS — I2510 Atherosclerotic heart disease of native coronary artery without angina pectoris: Secondary | ICD-10-CM | POA: Diagnosis not present

## 2024-09-24 DIAGNOSIS — Z85528 Personal history of other malignant neoplasm of kidney: Secondary | ICD-10-CM | POA: Diagnosis not present

## 2024-09-24 LAB — CBC
HCT: 31.1 % — ABNORMAL LOW (ref 36.0–46.0)
Hemoglobin: 10 g/dL — ABNORMAL LOW (ref 12.0–15.0)
MCH: 31.6 pg (ref 26.0–34.0)
MCHC: 32.2 g/dL (ref 30.0–36.0)
MCV: 98.4 fL (ref 80.0–100.0)
Platelets: 138 K/uL — ABNORMAL LOW (ref 150–400)
RBC: 3.16 MIL/uL — ABNORMAL LOW (ref 3.87–5.11)
RDW: 14.2 % (ref 11.5–15.5)
WBC: 11.2 K/uL — ABNORMAL HIGH (ref 4.0–10.5)
nRBC: 0 % (ref 0.0–0.2)

## 2024-09-24 LAB — BASIC METABOLIC PANEL WITH GFR
Anion gap: 13 (ref 5–15)
BUN: 14 mg/dL (ref 8–23)
CO2: 23 mmol/L (ref 22–32)
Calcium: 8.7 mg/dL — ABNORMAL LOW (ref 8.9–10.3)
Chloride: 108 mmol/L (ref 98–111)
Creatinine, Ser: 1.1 mg/dL — ABNORMAL HIGH (ref 0.44–1.00)
GFR, Estimated: 50 mL/min — ABNORMAL LOW (ref >60)
Glucose, Bld: 152 mg/dL — ABNORMAL HIGH (ref 70–99)
Potassium: 3.1 mmol/L — ABNORMAL LOW (ref 3.5–5.1)
Sodium: 143 mmol/L (ref 135–145)

## 2024-09-24 MED ORDER — OXYCODONE HCL 5 MG PO TABS: 5.0000 mg | ORAL_TABLET | ORAL | 0 refills | Status: AC | PRN

## 2024-09-24 MED ORDER — SENNA 8.6 MG PO TABS: 2.0000 | ORAL_TABLET | Freq: Every day | ORAL | 0 refills | Status: AC

## 2024-09-24 MED ORDER — METHOCARBAMOL 500 MG PO TABS: 500.0000 mg | ORAL_TABLET | Freq: Four times a day (QID) | ORAL | 2 refills | Status: DC | PRN

## 2024-09-24 MED ORDER — POLYETHYLENE GLYCOL 3350 17 G PO PACK: 17.0000 g | PACK | Freq: Two times a day (BID) | ORAL | 0 refills | Status: AC

## 2024-09-24 NOTE — Anesthesia Postprocedure Evaluation (Signed)
 Anesthesia Post Note  Patient: [name] [name]  Procedure(s) Performed: ARTHROPLASTY, KNEE, TOTAL (Right: Knee)     Patient location during evaluation: PACU Anesthesia Type: Spinal Level of consciousness: awake and alert Pain management: pain level controlled Vital Signs Assessment: post-procedure vital signs reviewed and stable Respiratory status: spontaneous breathing, nonlabored ventilation, respiratory function stable and patient connected to nasal cannula oxygen Cardiovascular status: blood pressure returned to baseline and stable Postop Assessment: no apparent nausea or vomiting Anesthetic complications: no   No notable events documented.  Last Vitals:  Vitals:   09/24/24 0908 09/24/24 1259  BP: (!) 123/48 (!) 135/55  Pulse: (!) 53 (!) 55  Resp: 16 16  Temp: 36.7 C 37 C  SpO2: 95% 95%    Last Pain:  Vitals:   09/24/24 1259  TempSrc: Oral  PainSc:                  [name]

## 2024-09-24 NOTE — Progress Notes (Signed)
   09/24/24 0933  TOC Brief Assessment  Insurance and Status Reviewed  Patient has primary care physician Yes  Home environment has been reviewed Home w/ spouse  Prior level of function: independent  Prior/Current Home Services No current home services  Social Drivers of Health Review SDOH reviewed no interventions necessary  Readmission risk has been reviewed Yes  Transition of care needs no transition of care needs at this time   DME: No needs PT: EO

## 2024-09-24 NOTE — Progress Notes (Addendum)
 Subjective: 1 Day Post-Op Procedure(s) (LRB): ARTHROPLASTY, KNEE, TOTAL (Right) Patient reports pain as mild.   Patient seen in rounds for Dr. Ernie. Patient is well, and has had no acute complaints or problems. No acute events overnight. Foley catheter removed. Patient ambulated 10 feet with PT.  We will start therapy today.   Objective: Vital signs in last 24 hours: Temp:  [97.6 F (36.4 C)-98.1 F (36.7 C)] 98.1 F (36.7 C) (10/08 0908) Pulse Rate:  [53-63] 53 (10/08 0908) Resp:  [10-18] 16 (10/08 0908) BP: (123-197)/(42-67) 123/48 (10/08 0908) SpO2:  [91 %-100 %] 95 % (10/08 0908) Weight:  [102.1 kg] 102.1 kg (10/07 1059)  Intake/Output from previous day:  Intake/Output Summary (Last 24 hours) at 09/24/2024 0927 Last data filed at 09/24/2024 0609 Gross per 24 hour  Intake 2309.44 ml  Output 1040 ml  Net 1269.44 ml     Intake/Output this shift: No intake/output data recorded.  Labs: Recent Labs    09/24/24 0358  HGB 10.0*   Recent Labs    09/24/24 0358  WBC 11.2*  RBC 3.16*  HCT 31.1*  PLT 138*   Recent Labs    09/24/24 0358  NA 143  K 3.1*  CL 108  CO2 23  BUN 14  CREATININE 1.10*  GLUCOSE 152*  CALCIUM  8.7*   No results for input(s): LABPT, INR in the last 72 hours.  Exam: General - Patient is Alert and Oriented Extremity - Neurologically intact Sensation intact distally Intact pulses distally Dorsiflexion/Plantar flexion intact Dressing - dressing C/D/I Motor Function - intact, moving foot and toes well on exam.   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 Tortuous cororany arteries.   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 Verlin & Dr. Murriel.    Assessment/Plan: 1 Day Post-Op Procedure(s) (LRB): ARTHROPLASTY, KNEE, TOTAL (Right) Principal Problem:   S/P total knee replacement, right Active Problems:   S/P total knee arthroplasty, right  Estimated body mass index is 36.32 kg/m as calculated from the following:   Height as of this encounter: 5' 6 (1.676 m).   Weight as of this encounter: 102.1 kg. Advance diet Up with therapy D/C IV fluids   Patient's anticipated LOS is less than 2 midnights, meeting these requirements: - Younger than 13 - Lives within 1 hour of care - Has a competent adult at home to recover with post-op recover - NO history of  - Chronic pain requiring opiods  - Diabetes  - Coronary Artery Disease  - Heart failure  - Heart attack  - Stroke  - DVT/VTE  - Cardiac arrhythmia  -  Respiratory Failure/COPD  - Renal failure  - Anemia  - Advanced Liver disease     DVT Prophylaxis - Eliquis  Weight bearing as tolerated.  Hgb stable at 10 this AM K 3.1 - with chronic hypokalemia, takes 40 meq daily - had this this AM  Plan is to go Home after hospital stay. Plan for discharge today following 1-2 sessions of PT as long as they are meeting their goals. Patient is scheduled for OPPT. Follow up in the office in 2 weeks.   Rosina Calin, PA-C Orthopedic Surgery [phone] 09/24/2024, 9:27 AM

## 2024-09-24 NOTE — Progress Notes (Signed)
 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 Pt reports that the R knee and the L knee can buckle at times.    PT Comments  POD # 1 pm session Assisted OOB to amb in hallway and practice stairs went well.  General stair comments: This is how I do it, stated Pt as she used ONE rail and a cane and correctly performed with Spouse hands on assisted using safety belt. Had Spouse hands on assist Pt to bathroom to void then back to bed.  Completed remaining TKR TE's following HEP handout.  Addressed all mobility questions, discussed appropriate activity, educated on use of ICE.  Pt ready for D/C to home.    If plan is discharge home, recommend the following: A little help with walking and/or transfers   Can travel by private vehicle        Equipment Recommendations  None recommended by PT    Recommendations for Other Services       Precautions / Restrictions Precautions Precautions: Knee Precaution/Restrictions Comments: no pillow under knee, other knee needs a TKR Restrictions Weight Bearing Restrictions Per Provider Order: No Other Position/Activity Restrictions: WBAT     Mobility  Bed Mobility Overal bed mobility: Needs Assistance Bed Mobility: Supine to Sit, Sit to Supine     Supine to sit: Supervision Sit to supine: Supervision   General bed mobility comments: demonstrated how to use a belt to self asisst LE off/onto bed    Transfers Overall transfer level: Needs assistance Equipment used: Rolling walker (2 wheels) Transfers: Sit to/from Stand Sit to Stand: Contact guard assist, Min assist           General transfer comment: Pt has her own unique style leaning way far forward then pushing up from lower walker bars.   this is how I do it, stated Pt.  Also assisted with a toilet transfer at Supervision level as Pt was self able to rise and lower.  VC's with safety on turns and backward steps with close proximity walker.    Ambulation/Gait Ambulation/Gait assistance: Contact guard assist, Min assist Gait Distance (Feet): 34 Feet Assistive device: Rolling walker (2 wheels) Gait Pattern/deviations: Step-to pattern, Decreased weight shift to right Gait velocity: decreased     General Gait Details: assisted with amb from bed to bathroom then from bathroom into hallway a total 1f 38 feet with increased time and VC's on proper walker to self distance and safety with turns.   Stairs Stairs: Yes Stairs assistance: Supervision, Contact guard assist   Number of Stairs: 2 General stair comments: This is how I do it, stated Pt as she used ONE rail and a cane and correctly performed with Spouse hands on assisted using safety belt.   Wheelchair Mobility     Tilt Bed    Modified Rankin (Stroke Patients Only)       Balance                                            Communication Communication Communication: No apparent difficulties  Cognition Arousal: Alert Behavior During Therapy: WFL for tasks assessed/performed   PT - Cognitive impairments: No apparent impairments  PT - Cognition Comments: AxO x 3 pleasant Lady and highly motivated.  Spouse present during session. Following commands: Intact      Cueing Cueing Techniques: Verbal cues  Exercises      General Comments        Pertinent Vitals/Pain Pain Assessment Pain Assessment: 0-10 Pain Score: 5  Pain Location: R knee Pain Descriptors / Indicators: Sore, Tender, Tightness, Operative site guarding Pain Intervention(s): Monitored during session, Premedicated before session, Repositioned, Ice applied    Home Living                          Prior Function             PT Goals (current goals can now be found in the care plan section) Progress towards PT goals: Progressing toward goals    Frequency    7X/week      PT Plan      Co-evaluation              AM-PAC PT 6 Clicks Mobility   Outcome Measure  Help needed turning from your back to your side while in a flat bed without using bedrails?: A Little Help needed moving from lying on your back to sitting on the side of a flat bed without using bedrails?: A Little Help needed moving to and from a bed to a chair (including a wheelchair)?: A Little Help needed standing up from a chair using your arms (e.g., wheelchair or bedside chair)?: A Little Help needed to walk in hospital room?: A Little Help needed climbing 3-5 steps with a railing? : A Little 6 Click Score: 18    End of Session Equipment Utilized During Treatment: Gait belt Activity Tolerance: Patient tolerated treatment well Patient left: in bed;with call bell/phone within reach;with chair alarm set;with family/visitor present Nurse Communication: Mobility status PT Visit Diagnosis: Other abnormalities of gait and mobility (R26.89)     Time: 8578-8550 PT Time Calculation (min) (ACUTE ONLY): 28 min  Charges:    $Gait Training: 8-22 mins $Therapeutic Exercise: 8-22 mins PT General Charges $$ ACUTE PT VISIT: 1 Visit                    Katheryn Leap  PTA Acute  Rehabilitation Services Office M-F          [phone]

## 2024-09-24 NOTE — Plan of Care (Signed)
   Problem: Education: Goal: Knowledge of General Education information will improve Description Including pain rating scale, medication(s)/side effects and non-pharmacologic comfort measures Outcome: Progressing   Problem: Health Behavior/Discharge Planning: Goal: Ability to manage health-related needs will improve Outcome: Progressing

## 2024-09-24 NOTE — Care Management Obs Status (Signed)
 MEDICARE OBSERVATION STATUS NOTIFICATION   Patient Details  Name: [name] [name] MRN: [id number] Date of Birth: [birth year]   Medicare Observation Status Notification Given:  Yes    Sheri ONEIDA Sharps, LCSW 09/24/2024, 9:19 AM

## 2024-09-24 NOTE — Progress Notes (Signed)
 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 Pt reports that the R knee and the L knee can buckle at times.    PT Comments  POD # 1 am session PT - Cognition Comments: AxO x 3 pleasant Lady and highly motivated.  Spouse present during session. Assisted OOB required increased time.  General bed mobility comments: demonstrated how to use a belt to self asisst LE off/onto bed.  General transfer comment: Pt has her own unique style leaning way far forward then pushing up from lower walker bars.  this is how I do it, stated Pt.  Also assisted with a toilet transfer at Supervision level as Pt was self able to rise and lower.  VC's with safety on turns and backward steps with close proximity walker. General Gait Details: assisted with amb from bed to bathroom then from bathroom into hallway a total 44f 22 feet with increased time and VC's on proper walker to self distance and safety with turns. Then returned to room to perform some TE's following HEP handout.  Instructed on proper tech, freq as well as use of ICE.   Will see Pt again this afternoon for stair training.  Pt plans to D/C to home after her second PT session.    If plan is discharge home, recommend the following: A little help with walking and/or transfers   Can travel by private vehicle        Equipment Recommendations  None recommended by PT    Recommendations for Other Services       Precautions / Restrictions Precautions Precautions: Knee Precaution/Restrictions Comments: no pillow under knee, other knee needs a TKR Restrictions Weight Bearing Restrictions Per Provider Order: No Other Position/Activity Restrictions: WBAT     Mobility  Bed Mobility Overal bed mobility: Needs  Assistance Bed Mobility: Supine to Sit, Sit to Supine     Supine to sit: Supervision Sit to supine: Supervision   General bed mobility comments: demonstrated how to use a belt to self asisst LE off/onto bed    Transfers Overall transfer level: Needs assistance Equipment used: Rolling walker (2 wheels) Transfers: Sit to/from Stand Sit to Stand: Contact guard assist, Min assist           General transfer comment: Pt has her own unique style leaning way far forward then pushing up from lower walker bars.  this is how I do it, stated Pt.  Also assisted with a toilet transfer at Supervision level as Pt was self able to rise and lower.  VC's with safety on turns and backward steps with close proximity walker.    Ambulation/Gait Ambulation/Gait assistance: Contact guard assist, Min assist Gait Distance (Feet): 22 Feet Assistive device: Rolling walker (2 wheels) Gait Pattern/deviations: Step-to pattern, Decreased weight shift to right Gait velocity: decreased     General Gait Details: assisted with amb from bed to bathroom then from bathroom into hallway a total 64f 22 feet with increased time and VC's on proper walker to self distance and safety with turns.   Stairs             Wheelchair Mobility     Tilt Bed    Modified Rankin (Stroke Patients Only)       Balance  Communication Communication Communication: No apparent difficulties  Cognition Arousal: Alert Behavior During Therapy: WFL for tasks assessed/performed   PT - Cognitive impairments: No apparent impairments                       PT - Cognition Comments: AxO x 3 pleasant Lady and highly motivated.  Spouse present during session. Following commands: Intact      Cueing Cueing Techniques: Verbal cues  Exercises  Total Knee Replacement TE's following HEP handout 10 reps B LE ankle pumps 05 reps towel squeezes 05 reps knee  presses 05 reps heel slides  05 reps SAQ's 05 reps SLR's 05 reps ABD Educated on use of gait belt to assist with TE's Followed by ICE     General Comments        Pertinent Vitals/Pain Pain Assessment Pain Assessment: 0-10 Pain Score: 5  Pain Location: R knee Pain Descriptors / Indicators: Sore, Tender, Tightness, Operative site guarding Pain Intervention(s): Monitored during session, Premedicated before session, Repositioned, Ice applied    Home Living                          Prior Function            PT Goals (current goals can now be found in the care plan section) Progress towards PT goals: Progressing toward goals    Frequency    7X/week      PT Plan      Co-evaluation              AM-PAC PT 6 Clicks Mobility   Outcome Measure  Help needed turning from your back to your side while in a flat bed without using bedrails?: A Little Help needed moving from lying on your back to sitting on the side of a flat bed without using bedrails?: A Little Help needed moving to and from a bed to a chair (including a wheelchair)?: A Little Help needed standing up from a chair using your arms (e.g., wheelchair or bedside chair)?: A Little Help needed to walk in hospital room?: A Little Help needed climbing 3-5 steps with a railing? : A Little 6 Click Score: 18    End of Session Equipment Utilized During Treatment: Gait belt Activity Tolerance: Patient tolerated treatment well Patient left: in bed;with call bell/phone within reach;with chair alarm set;with family/visitor present Nurse Communication: Mobility status PT Visit Diagnosis: Other abnormalities of gait and mobility (R26.89)     Time: 8952-8881 PT Time Calculation (min) (ACUTE ONLY): 31 min  Charges:    $Gait Training: 8-22 mins $Therapeutic Exercise: 8-22 mins PT General Charges $$ ACUTE PT VISIT: 1 Visit                     Katheryn Leap  PTA Acute  Rehabilitation  Services Office M-F          [phone]

## 2024-09-25 ENCOUNTER — Encounter (HOSPITAL_COMMUNITY): Payer: Self-pay | Admitting: Orthopedic Surgery

## 2024-09-26 DIAGNOSIS — M25561 Pain in right knee: Secondary | ICD-10-CM | POA: Diagnosis not present

## 2024-09-29 DIAGNOSIS — M25561 Pain in right knee: Secondary | ICD-10-CM | POA: Diagnosis not present

## 2024-09-30 NOTE — Progress Notes (Addendum)
 [name] [name]                                          MRN: [id number]   11/04/2024   The [facility] Quality Team Specialist reviewed this patient medical record for the purposes of chart review for care gap closure. The following were reviewed:  KED GAP: EGFR & UACR   [facility] Quality Team

## 2024-10-01 DIAGNOSIS — M25561 Pain in right knee: Secondary | ICD-10-CM | POA: Diagnosis not present

## 2024-10-02 NOTE — Discharge Summary (Signed)
 Patient ID: [name] [name] MRN: [id number] DOB/AGE: 1941-01-15 83 y.o.  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 Tortuous cororany arteries.   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 Verlin & Dr. Murriel.    Surgeries: Procedure(s): ARTHROPLASTY, KNEE, TOTAL on 09/23/2024   Consultants:   Discharged Condition: Improved  Hospital Course: [name] [name] is an 83 y.o. female who was admitted 09/23/2024 for operative treatment ofS/P total knee replacement, right. Patient has severe unremitting pain that affects sleep, daily activities, and work/hobbies. After pre-op clearance the patient was taken to the operating room on 09/23/2024 and underwent  Procedure(s): ARTHROPLASTY, KNEE, TOTAL.    Patient was given perioperative antibiotics:  Anti-infectives (From admission, onward)    Start     Dose/Rate Route Frequency Ordered Stop   09/23/24 1830  ceFAZolin  (ANCEF ) IVPB 2g/100 mL premix        2 g 200 mL/hr over 30 Minutes Intravenous Every 6 hours 09/23/24 1743 09/24/24 1130   09/23/24 1030   ceFAZolin  (ANCEF ) IVPB 2g/100 mL premix        2 g 200 mL/hr over 30 Minutes Intravenous On call to O.R. 09/23/24 1019 09/23/24 1300        Patient was given sequential compression devices, early ambulation, and chemoprophylaxis to prevent DVT. Patient worked with PT and was meeting their goals regarding safe ambulation and transfers.  Patient benefited maximally from hospital stay and there were no complications.    Recent vital signs: No data found.   Recent laboratory studies: No results for input(s): WBC, HGB, HCT, PLT, NA, K, CL, CO2, BUN, CREATININE, GLUCOSE, INR, CALCIUM  in the last 72 hours.  Invalid input(s): PT, 2   Discharge Medications:   Allergies as of 09/24/2024       Reactions   Tetanus-diphtheria Toxoids Td Other (See Comments)   Felt very sick   Cardizem  [diltiazem ] Rash        Medication List     TAKE these medications    acetaminophen  500 MG tablet Commonly known as: TYLENOL  Take 1,000 mg by mouth daily as needed for moderate pain or headache.   amiodarone  200 MG tablet Commonly known as: PACERONE  Take 1 tablet (200 mg total) by mouth daily.   amoxicillin  500 MG capsule Commonly known as: AMOXIL  Take 4 capsules (2,000 mg total) by mouth as directed. Take 4 tablets 1 hour prior to dental work,  including cleanings.   Blue-Emu Super Strength Crea Apply 1 application  topically daily as needed (arthritis).   Calcium  600+D 600-800 MG-UNIT Tabs Generic drug: Calcium  Carb-Cholecalciferol Take 1 tablet by mouth every evening.   carboxymethylcellulose 0.5 % Soln Commonly known as: REFRESH PLUS Place 1 drop into both eyes 3 (three) times daily as needed (dry eyes).   CHLORASEPTIC MT Use as directed 1 spray in the mouth or throat at bedtime.   chlorhexidine  4 % external liquid Commonly known as: HIBICLENS  Apply 15 mLs (1 Application total) topically as directed for 30 doses. Use as directed daily for 5 days every  other week for 6 weeks.   CORTIZONE-10/ALOE EX Apply 1 application topically 2 (two) times daily as needed (itching).   Eliquis  5 MG Tabs tablet Generic drug: apixaban  TAKE 1 TABLET BY MOUTH TWICE A DAY   HALLS COUGH DROPS MT Use as directed 1-2 each in the mouth or throat every Sunday.   methocarbamol 500 MG tablet Commonly known as: ROBAXIN Take 1 tablet (500 mg total) by mouth every 6 (six) hours as needed for muscle spasms.   metoprolol  succinate 25 MG 24 hr tablet Commonly known as: TOPROL -XL Take 1 tablet (25 mg total) by mouth daily.   mupirocin ointment 2 % Commonly known as: BACTROBAN Place 1 Application into the nose 2 (two) times daily for 60 doses. Use as directed 2 times daily for 5 days every other week for 6 weeks.   oxyCODONE  5 MG immediate release tablet Commonly known as: Oxy IR/ROXICODONE  Take 1 tablet (5 mg total) by mouth every 4 (four) hours as needed for severe pain (pain score 7-10).   polyethylene glycol 17 g packet Commonly known as: MIRALAX  / GLYCOLAX  Take 17 g by mouth 2 (two) times daily.   Potassium Chloride  ER 20 MEQ Tbcr Take 2 tablets (40 mEq total) by mouth daily.   PRESERVISION AREDS 2 PO Take 1 capsule by mouth 2 (two) times daily.   ONE A DAY WOMEN 50 PLUS PO Take 1 tablet by mouth every evening.   senna 8.6 MG Tabs tablet Commonly known as: SENOKOT Take 2 tablets (17.2 mg total) by mouth at bedtime for 14 days.   torsemide  20 MG tablet Commonly known as: DEMADEX  Take 1 tablet (20 mg total) by mouth 2 (two) times daily.               Discharge Care Instructions  (From admission, onward)           Start     Ordered   09/24/24 0000  Change dressing       Comments: Maintain surgical dressing until follow up in the clinic. If the edges start to pull up, may reinforce with tape. If the dressing is no longer working, may remove and cover with gauze and tape, but must keep the area dry and clean.  Call with any questions  or concerns.   09/24/24 0929            Diagnostic Studies: No results found.  Disposition: Discharge disposition: 01-Home or Self Care       Discharge Instructions     Call MD / Call 911   Complete by: As directed    If you experience chest pain or shortness of breath, CALL 911 and be transported to the hospital emergency room.  If you develope a fever above 101 F, pus (white drainage) or increased drainage or redness at the wound, or calf pain, call your  surgeon's office.   Change dressing   Complete by: As directed    Maintain surgical dressing until follow up in the clinic. If the edges start to pull up, may reinforce with tape. If the dressing is no longer working, may remove and cover with gauze and tape, but must keep the area dry and clean.  Call with any questions or concerns.   Constipation Prevention   Complete by: As directed    Drink plenty of fluids.  Prune juice may be helpful.  You may use a stool softener, such as Colace (over the counter) 100 mg twice a day.  Use MiraLax  (over the counter) for constipation as needed.   Diet - low sodium heart healthy   Complete by: As directed    Increase activity slowly as tolerated   Complete by: As directed    Weight bearing as tolerated with assist device (walker, cane, etc) as directed, use it as long as suggested by your surgeon or therapist, typically at least 4-6 weeks.   Post-operative opioid taper instructions:   Complete by: As directed    POST-OPERATIVE OPIOID TAPER INSTRUCTIONS: It is important to wean off of your opioid medication as soon as possible. If you do not need pain medication after your surgery it is ok to stop day one. Opioids include: Codeine, Hydrocodone (Norco, Vicodin), Oxycodone (Percocet, oxycontin ) and hydromorphone  amongst others.  Long term and even short term use of opiods can cause: Increased pain response Dependence Constipation Depression Respiratory depression And more.  Withdrawal  symptoms can include Flu like symptoms Nausea, vomiting And more Techniques to manage these symptoms Hydrate well Eat regular healthy meals Stay active Use relaxation techniques(deep breathing, meditating, yoga) Do Not substitute Alcohol to help with tapering If you have been on opioids for less than two weeks and do not have pain than it is ok to stop all together.  Plan to wean off of opioids This plan should start within one week post op of your joint replacement. Maintain the same interval or time between taking each dose and first decrease the dose.  Cut the total daily intake of opioids by one tablet each day Next start to increase the time between doses. The last dose that should be eliminated is the evening dose.      TED hose   Complete by: As directed    Use stockings (TED hose) for 2 weeks on both leg(s).  You may remove them at night for sleeping.        Follow-up Information     [facility] Go on 09/26/2024.   Why: You are scheduled for physical therapy Friday 09/26/24 at 2:50pm Contact information: [address] [place] [address] [phone]         Patti Rosina SAUNDERS, PA-C. Go on 10/08/2024.   Specialty: Orthopedic Surgery Why: You are scheduled for a post op appointment on Wednesday 10/08/24 at 3:00pm Contact information: [address] [place] [address] [phone]                  Signed: Rosina SAUNDERS Patti 10/02/2024, 7:49 AM

## 2024-10-06 DIAGNOSIS — M25561 Pain in right knee: Secondary | ICD-10-CM | POA: Diagnosis not present

## 2024-10-08 DIAGNOSIS — M25561 Pain in right knee: Secondary | ICD-10-CM | POA: Diagnosis not present

## 2024-10-10 DIAGNOSIS — M25561 Pain in right knee: Secondary | ICD-10-CM | POA: Diagnosis not present

## 2024-10-13 DIAGNOSIS — M25561 Pain in right knee: Secondary | ICD-10-CM | POA: Diagnosis not present

## 2024-11-26 NOTE — Progress Notes (Signed)
 [name] [name]                                          MRN: [id number]   11/26/2024   The [facility] Quality Team Specialist reviewed this patient medical record for the purposes of chart review for care gap closure. The following were reviewed: chart review for care gap closure-kidney health evaluation for diabetes:eGFR  and uACR.    [facility]BCI Quality Team

## 2024-12-23 NOTE — Patient Instructions (Signed)
 SURGICAL WAITING ROOM VISITATION Patients having surgery or a procedure may have no more than 2 support people in the waiting area - these visitors may rotate in the visitor waiting room.   Due to an increase in RSV and influenza rates and associated hospitalizations, children ages 62 and under may not visit patients in [facility]. If the patient needs to stay at the hospital during part of their recovery, the visitor guidelines for inpatient rooms apply.  PRE-OP VISITATION  Pre-op nurse will coordinate an appropriate time for 1 support person to accompany the patient in pre-op.  This support person may not rotate.  This visitor will be contacted when the time is appropriate for the visitor to come back in the pre-op area.  Please refer to the [facility] website for the visitor guidelines for Inpatients (after your surgery is over and you are in a regular room).  You are not required to quarantine at this time prior to your surgery. However, you must do this: Hand Hygiene often Do NOT share personal items Notify your provider if you are in close contact with someone who has COVID or you develop fever 100.4 or greater, new onset of sneezing, cough, sore throat, shortness of breath or body aches.  If you test positive for Covid or have been in contact with anyone that has tested positive in the last 10 days please notify you surgeon.    Your procedure is scheduled on:  12/30/24  Report to [facility] Main Entrance: [place] entrance where the [employer] is available.   Report to admitting at: 7:15 AM  Call this number if you have any questions or problems the morning of surgery [phone]  FOLLOW ANY ADDITIONAL PRE OP INSTRUCTIONS YOU RECEIVED FROM YOUR SURGEON'S OFFICE!!!  Do not eat food after Midnight the night prior to your surgery/procedure.  After Midnight you may have the following liquids until: 6:45 AM DAY OF SURGERY  Clear Liquid Diet Water  Black  Coffee (sugar ok, NO MILK/CREAM OR CREAMERS)  Tea (sugar ok, NO MILK/CREAM OR CREAMERS) regular and decaf                             Plain Jell-O  with no fruit (NO RED)                                           Fruit ices (not with fruit pulp, NO RED)                                     Popsicles (NO RED)                                                                  Juice: NO CITRUS JUICES: only apple, WHITE grape, WHITE cranberry Sports drinks like Gatorade or Powerade (NO RED)   The day of surgery:  Drink ONE (1) Pre-Surgery Clear G2 at : 6:45 AM the morning of surgery. Drink in one sitting. Do not sip.  This drink was given  to you during your hospital pre-op appointment visit. Nothing else to drink after completing the Pre-Surgery Clear Ensure or G2 : No candy, chewing gum or throat lozenges.    Oral Hygiene is also important to reduce your risk of infection.        Remember - BRUSH YOUR TEETH THE MORNING OF SURGERY WITH YOUR REGULAR TOOTHPASTE  Do NOT smoke after Midnight the night before surgery.  STOP TAKING all Vitamins, Herbs and supplements 1 week before your surgery.   Take ONLY these medicines the morning of surgery with A SIP OF WATER : paceron(amiodarone ),metoprolol .Tylenol  as needed.  If You have been diagnosed with Sleep Apnea - Bring CPAP mask and tubing day of surgery. We will provide you with a CPAP machine on the day of your surgery.                   You may not have any metal on your body including hair pins, jewelry, and body piercing  Do not wear make-up, lotions, powders, perfumes / cologne, or deodorant  Do not wear nail polish including gel and S&S, artificial / acrylic nails, or any other type of covering on natural nails including finger and toenails. If you have artificial nails, gel coating, etc., that needs to be removed by a nail salon, Please have this removed prior to surgery. Not doing so may mean that your surgery could be cancelled or delayed if  the Surgeon or anesthesia staff feels like they are unable to monitor you safely.   Do not shave 48 hours prior to surgery to avoid nicks in your skin which may contribute to postoperative infections.   Contacts, Hearing Aids, dentures or bridgework may not be worn into surgery. DENTURES WILL BE REMOVED PRIOR TO SURGERY PLEASE DO NOT APPLY Poly grip OR ADHESIVES!!!  You may bring a small overnight bag with you on the day of surgery, only pack items that are not valuable. [facility] IS NOT RESPONSIBLE   FOR VALUABLES THAT ARE LOST OR STOLEN.   Patients discharged on the day of surgery will not be allowed to drive home.  Someone NEEDS to stay with you for the first 24 hours after anesthesia.  Do not bring your home medications to the hospital. The Pharmacy will dispense medications listed on your medication list to you during your admission in the Hospital.  Special Instructions: Bring a copy of your healthcare power of attorney and living will documents the day of surgery, if you wish to have them scanned into your [facility] Records- EPIC  Please read over the following fact sheets you were given: IF YOU HAVE QUESTIONS ABOUT YOUR PRE-OP INSTRUCTIONS, PLEASE CALL [phone]  PATIENT SIGNATURE_________________________________  NURSE SIGNATURE__________________________________  ________________________________________________________________________  Pre-operative 4 CHG Bath Instructions  DYNA-Hex 4 Chlorhexidine  Gluconate 4% Solution Antiseptic 4 fl. oz   You can play a key role in reducing the risk of infection after surgery. Your skin needs to be as free of germs as possible. You can reduce the number of germs on your skin by washing with CHG (chlorhexidine  gluconate) soap before surgery. CHG is an antiseptic soap that kills germs and continues to kill germs even after washing.   DO NOT use if you have an allergy to chlorhexidine /CHG or antibacterial soaps. If your skin  becomes reddened or irritated, stop using the CHG and notify one of our RNs at   Please shower with the CHG soap starting 4 days before surgery using the following schedule:  Please keep in mind the following:  DO NOT shave, including legs and underarms, starting the day of your first shower.   You may shave your face at any point before/day of surgery.  Place clean sheets on your bed the day you start using CHG soap. Use a clean washcloth (not used since being washed) for each shower. DO NOT sleep with pets once you start using the CHG.  CHG Shower Instructions:  If you choose to wash your hair and private area, wash first with your normal shampoo/soap.  After you use shampoo/soap, rinse your hair and body thoroughly to remove shampoo/soap residue.  Turn the water  OFF and apply about 3 tablespoons (45 ml) of CHG soap to a CLEAN washcloth.  Apply CHG soap ONLY FROM YOUR NECK DOWN TO YOUR TOES (washing for 3-5 minutes)  DO NOT use CHG soap on face, private areas, open wounds, or sores.  Pay special attention to the area where your surgery is being performed.  If you are having back surgery, having someone wash your back for you may be helpful. Wait 2 minutes after CHG soap is applied, then you may rinse off the CHG soap.  Pat dry with a clean towel  Put on clean clothes/pajamas   If you choose to wear lotion, please use ONLY the CHG-compatible lotions on the back of this paper.     Additional instructions for the day of surgery: DO NOT APPLY any lotions, deodorants, cologne, or perfumes.   Put on clean/comfortable clothes.  Brush your teeth.  Ask your nurse before applying any prescription medications to the skin.   CHG Compatible Lotions   Aveeno Moisturizing lotion  Cetaphil Moisturizing Cream  Cetaphil Moisturizing Lotion  Clairol Herbal Essence Moisturizing Lotion, Dry Skin  Clairol Herbal Essence Moisturizing Lotion, Extra Dry Skin  Clairol Herbal Essence Moisturizing  Lotion, Normal Skin  Curel Age Defying Therapeutic Moisturizing Lotion with Alpha Hydroxy  Curel Extreme Care Body Lotion  Curel Soothing Hands Moisturizing Hand Lotion  Curel Therapeutic Moisturizing Cream, Fragrance-Free  Curel Therapeutic Moisturizing Lotion, Fragrance-Free  Curel Therapeutic Moisturizing Lotion, Original Formula  Eucerin Daily Replenishing Lotion  Eucerin Dry Skin Therapy Plus Alpha Hydroxy Crme  Eucerin Dry Skin Therapy Plus Alpha Hydroxy Lotion  Eucerin Original Crme  Eucerin Original Lotion  Eucerin Plus Crme Eucerin Plus Lotion  Eucerin TriLipid Replenishing Lotion  Keri Anti-Bacterial Hand Lotion  Keri Deep Conditioning Original Lotion Dry Skin Formula Softly Scented  Keri Deep Conditioning Original Lotion, Fragrance Free Sensitive Skin Formula  Keri Lotion Fast Absorbing Fragrance Free Sensitive Skin Formula  Keri Lotion Fast Absorbing Softly Scented Dry Skin Formula  Keri Original Lotion  Keri Skin Renewal Lotion Keri Silky Smooth Lotion  Keri Silky Smooth Sensitive Skin Lotion  Nivea Body Creamy Conditioning Oil  Nivea Body Extra Enriched Lotion  Nivea Body Original Lotion  Nivea Body Sheer Moisturizing Lotion Nivea Crme  Nivea Skin Firming Lotion  NutraDerm 30 Skin Lotion  NutraDerm Skin Lotion  NutraDerm Therapeutic Skin Cream  NutraDerm Therapeutic Skin Lotion  ProShield Protective Hand Cream  Provon moisturizing lotion  Incentive Spirometer  An incentive spirometer is a tool that can help keep your lungs clear and active. This tool measures how well you are filling your lungs with each breath. Taking long deep breaths may help reverse or decrease the chance of developing breathing (pulmonary) problems (especially infection) following: A long period of time when you are unable to move or be active. BEFORE THE PROCEDURE  If  the spirometer includes an indicator to show your best effort, your nurse or respiratory therapist will set it to a  desired goal. If possible, sit up straight or lean slightly forward. Try not to slouch. Hold the incentive spirometer in an upright position. INSTRUCTIONS FOR USE  Sit on the edge of your bed if possible, or sit up as far as you can in bed or on a chair. Hold the incentive spirometer in an upright position. Breathe out normally. Place the mouthpiece in your mouth and seal your lips tightly around it. Breathe in slowly and as deeply as possible, raising the piston or the ball toward the top of the column. Hold your breath for 3-5 seconds or for as long as possible. Allow the piston or ball to fall to the bottom of the column. Remove the mouthpiece from your mouth and breathe out normally. Rest for a few seconds and repeat Steps 1 through 7 at least 10 times every 1-2 hours when you are awake. Take your time and take a few normal breaths between deep breaths. The spirometer may include an indicator to show your best effort. Use the indicator as a goal to work toward during each repetition. After each set of 10 deep breaths, practice coughing to be sure your lungs are clear. If you have an incision (the cut made at the time of surgery), support your incision when coughing by placing a pillow or rolled up towels firmly against it. Once you are able to get out of bed, walk around indoors and cough well. You may stop using the incentive spirometer when instructed by your caregiver.  RISKS AND COMPLICATIONS Take your time so you do not get dizzy or light-headed. If you are in pain, you may need to take or ask for pain medication before doing incentive spirometry. It is harder to take a deep breath if you are having pain. AFTER USE Rest and breathe slowly and easily. It can be helpful to keep track of a log of your progress. Your caregiver can provide you with a simple table to help with this. If you are using the spirometer at home, follow these instructions: SEEK MEDICAL CARE IF:  You are having  difficultly using the spirometer. You have trouble using the spirometer as often as instructed. Your pain medication is not giving enough relief while using the spirometer. You develop fever of 100.5 F (38.1 C) or higher. SEEK IMMEDIATE MEDICAL CARE IF:  You cough up bloody sputum that had not been present before. You develop fever of 102 F (38.9 C) or greater. You develop worsening pain at or near the incision site. MAKE SURE YOU:  Understand these instructions. Will watch your condition. Will get help right away if you are not doing well or get worse. Document Released: 04/16/2007 Document Revised: 02/26/2012 Document Reviewed: 06/17/2007 [facility] Patient Information 2014 [place], [place].   ________________________________________________________________________

## 2024-12-24 ENCOUNTER — Other Ambulatory Visit: Payer: Self-pay

## 2024-12-24 ENCOUNTER — Encounter (HOSPITAL_COMMUNITY): Payer: Self-pay

## 2024-12-24 ENCOUNTER — Encounter (HOSPITAL_COMMUNITY)
Admission: RE | Admit: 2024-12-24 | Discharge: 2024-12-24 | Disposition: A | Discharge status: Home / Self Care | Source: Ambulatory Visit | Attending: Orthopedic Surgery | Admitting: Orthopedic Surgery

## 2024-12-24 VITALS — BP 147/46 | HR 57 | Temp 97.9°F | Ht 66.0 in | Wt 227.0 lb

## 2024-12-24 DIAGNOSIS — I4819 Other persistent atrial fibrillation: Secondary | ICD-10-CM | POA: Insufficient documentation

## 2024-12-24 DIAGNOSIS — I2510 Atherosclerotic heart disease of native coronary artery without angina pectoris: Secondary | ICD-10-CM | POA: Diagnosis not present

## 2024-12-24 DIAGNOSIS — I10 Essential (primary) hypertension: Secondary | ICD-10-CM

## 2024-12-24 DIAGNOSIS — I447 Left bundle-branch block, unspecified: Secondary | ICD-10-CM | POA: Diagnosis not present

## 2024-12-24 DIAGNOSIS — I1310 Hypertensive heart and chronic kidney disease without heart failure, with stage 1 through stage 4 chronic kidney disease, or unspecified chronic kidney disease: Secondary | ICD-10-CM | POA: Insufficient documentation

## 2024-12-24 DIAGNOSIS — Z953 Presence of xenogenic heart valve: Secondary | ICD-10-CM | POA: Insufficient documentation

## 2024-12-24 DIAGNOSIS — N183 Chronic kidney disease, stage 3 unspecified: Secondary | ICD-10-CM | POA: Insufficient documentation

## 2024-12-24 DIAGNOSIS — Z7901 Long term (current) use of anticoagulants: Secondary | ICD-10-CM | POA: Insufficient documentation

## 2024-12-24 DIAGNOSIS — M1712 Unilateral primary osteoarthritis, left knee: Secondary | ICD-10-CM | POA: Diagnosis not present

## 2024-12-24 DIAGNOSIS — Z96651 Presence of right artificial knee joint: Secondary | ICD-10-CM | POA: Insufficient documentation

## 2024-12-24 DIAGNOSIS — I5030 Unspecified diastolic (congestive) heart failure: Secondary | ICD-10-CM | POA: Insufficient documentation

## 2024-12-24 DIAGNOSIS — E1122 Type 2 diabetes mellitus with diabetic chronic kidney disease: Secondary | ICD-10-CM | POA: Diagnosis not present

## 2024-12-24 DIAGNOSIS — Z01812 Encounter for preprocedural laboratory examination: Secondary | ICD-10-CM | POA: Insufficient documentation

## 2024-12-24 DIAGNOSIS — Z01818 Encounter for other preprocedural examination: Secondary | ICD-10-CM

## 2024-12-24 DIAGNOSIS — E119 Type 2 diabetes mellitus without complications: Secondary | ICD-10-CM

## 2024-12-24 HISTORY — DX: Chronic kidney disease, unspecified: N18.9

## 2024-12-24 LAB — CBC
HCT: 42.8 % (ref 36.0–46.0)
Hemoglobin: 13.2 g/dL (ref 12.0–15.0)
MCH: 29 pg (ref 26.0–34.0)
MCHC: 30.8 g/dL (ref 30.0–36.0)
MCV: 94.1 fL (ref 80.0–100.0)
Platelets: 197 K/uL (ref 150–400)
RBC: 4.55 MIL/uL (ref 3.87–5.11)
RDW: 15.3 % (ref 11.5–15.5)
WBC: 7.5 K/uL (ref 4.0–10.5)
nRBC: 0 % (ref 0.0–0.2)

## 2024-12-24 LAB — BASIC METABOLIC PANEL WITH GFR
Anion gap: 11 (ref 5–15)
BUN: 27 mg/dL — ABNORMAL HIGH (ref 8–23)
CO2: 26 mmol/L (ref 22–32)
Calcium: 9.6 mg/dL (ref 8.9–10.3)
Chloride: 107 mmol/L (ref 98–111)
Creatinine, Ser: 1.4 mg/dL — ABNORMAL HIGH (ref 0.44–1.00)
GFR, Estimated: 38 mL/min — ABNORMAL LOW
Glucose, Bld: 101 mg/dL — ABNORMAL HIGH (ref 70–99)
Potassium: 4.8 mmol/L (ref 3.5–5.1)
Sodium: 144 mmol/L (ref 135–145)

## 2024-12-24 LAB — SURGICAL PCR SCREEN
MRSA, PCR: NEGATIVE
Staphylococcus aureus: NEGATIVE

## 2024-12-24 LAB — HEMOGLOBIN A1C
Hgb A1c MFr Bld: 5.5 % (ref 4.8–5.6)
Mean Plasma Glucose: 111.15 mg/dL

## 2024-12-24 LAB — GLUCOSE, CAPILLARY: Glucose-Capillary: 107 mg/dL — ABNORMAL HIGH (ref 70–99)

## 2024-12-24 NOTE — Progress Notes (Addendum)
 For Anesthesia: PCP - NO PCP Cardiologist - Verlin Lonni BIRCH, MD   Clearance: Rosaline Bane NP: 09/15/24 Bowel Prep reminder:  Chest x-ray - CT coronary: 12/23/23 EKG -  Stress Test -  ECHO - 11/26/23 Cardiac Cath - 06/02/20 Pacemaker/ICD device last checked: Pacemaker orders received: Device Rep notified:  Spinal Cord Stimulator:N/A  Sleep Study - N/A CPAP -   Fasting Blood Sugar - N/A Checks Blood Sugar __0___ times a day Date and result of last Hgb A1c-4.8: 09/11/24  Last dose of GLP1 agonist- N/A GLP1 instructions: Hold 7 days prior to schedule (Hold 24 hours-daily)   Last dose of SGLT-2 inhibitors- N/A SGLT-2 instructions: Hold 72 hours prior to surgery  Blood Thinner Instructions: Eliquis  will be on hold: after 12/26/24 Last Dose: Time last taken:  Aspirin  Instructions: Last Dose: Time last taken:  Activity level: Can go up a flight of stairs and activities of daily living without stopping and without chest pain and/or shortness of breath   Able to exercise without chest pain and/or shortness of breath    Anesthesia review: Hx: HTN,Afib,CAD,DIA.  Patient denies shortness of breath, fever, cough and chest pain at PAT appointment   Patient verbalized understanding of instructions that were reviewed over the telephone.

## 2024-12-25 NOTE — Anesthesia Preprocedure Evaluation (Signed)
"                                    Anesthesia Evaluation  Patient identified by MRN, date of birth, ID band Patient awake    Reviewed: Allergy & Precautions, NPO status , Patient's Chart, lab work & pertinent test results, reviewed documented beta blocker date and time   History of Anesthesia Complications Negative for: history of anesthetic complications  Airway Mallampati: II  TM Distance: >3 FB Neck ROM: Full    Dental  (+) Chipped, Dental Advisory Given   Pulmonary neg pulmonary ROS   breath sounds clear to auscultation       Cardiovascular hypertension, Pt. on medications and Pt. on home beta blockers (-) angina + CAD (non-obstructive)  + dysrhythmias Atrial Fibrillation + Valvular Problems/Murmurs (s/p TAVR, residual PVL, mod-severe, mod MS, mod MR) AS, AI and MR  Rhythm:Irregular Rate:Normal + Systolic murmurs 87/7975 ECHO: EF 55%.  1. The LV has normal function, no regional wall motion abnormalities. Grade I diastolic dysfunction (impaired relaxation).   2. RVF is normal. The right ventricular size is normal. There is mildly elevated pulmonary artery systolic pressure. The estimated right ventricular systolic pressure is 36.4 mmHg.   3. Left atrial size was moderately dilated.   4. The mitral valve is degenerative. Mild to moderate mitral valve regurgitation. Mild to moderate mitral stenosis. The mean mitral valve gradient is 4.6 mmHg, mean gradient 1.84 cm^2 by VTI. Moderate mitral annular calcification.   5. The aortic valve has been repaired/replaced. Eccentric peri-valvular aortic regurgitation is moderate-severe. Holodiastolic flow reversal in the descending thoracic aorta is not noted. There is a 29 mm Medtronic CoreValve-EvolutR prosthetic (TAVR) valve present in the aortic position. Procedure Date: 11/28/2022. Mean gradient 16 mmHg (prior 12 mmHg) with EOA 1.68 cm^2. DI 0.4.     Neuro/Psych negative neurological ROS     GI/Hepatic negative GI ROS, Neg  liver ROS,,,  Endo/Other  diabetes (diet control, glu 110)    Renal/GU Renal InsufficiencyRenal disease     Musculoskeletal  (+) Arthritis ,    Abdominal   Peds  Hematology Hb 13.2, plt 197k Eliquis : last dose 12/27/2023   Anesthesia Other Findings   Reproductive/Obstetrics                              Anesthesia Physical Anesthesia Plan  ASA: 4  Anesthesia Plan: Spinal   Post-op Pain Management: Regional block* and Tylenol  PO (pre-op)*   Induction: Intravenous  PONV Risk Score and Plan: 2 and Treatment may vary due to age or medical condition  Airway Management Planned: Natural Airway and Simple Face Mask  Additional Equipment: None  Intra-op Plan:   Post-operative Plan:   Informed Consent: I have reviewed the patients History and Physical, chart, labs and discussed the procedure including the risks, benefits and alternatives for the proposed anesthesia with the patient or authorized representative who has indicated his/her understanding and acceptance.     Dental advisory given  Plan Discussed with: CRNA and Surgeon  Anesthesia Plan Comments: (See PAT note from 1/7 Plan routine monitors, SAB with adductor canal block for post op analgesia)         Anesthesia Quick Evaluation  "

## 2024-12-25 NOTE — Progress Notes (Signed)
 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  She is s/p R TKA on 09/23/24 under spinal anesthesia. No complications noted. Now scheduled for L side.  Patient follows with cardiology for history of severe aortic stenosis status post TAVR in 2023.  Last echo in 11/2023 showed normal LVEF of 55%, grade 1 diastolic dysfunction, mildly elevated PASP at 36.4 mmHg, moderately dilated LA, mild to moderate MR, mild to moderate MS, replaced aortic valve with eccentric peri-valvular aortic regurgitation which is moderate-severe. Mean gradient of 16mm Hg. 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 Mild hypo-attenuating leaflet thickening of all 3 leaflets with no restricted leaflet motion. Per Dr. Verlin on 03/10/24: The images were reviewed in our structural team meeting and we all agreed that balloon expansion of the bioprosthetic valve would not be a good option given the bulky calcification of the RCC leaflet which is the reason for her PVL. She remains on Eliquis .  Per cardiology preoperative evaluation 09/15/2024,  Preoperative Cardiovascular Risk Assessment: According to the Revised Cardiac Risk Index (RCRI), her Perioperative Risk of Major Cardiac Event is (%): 0.9. Her Functional Capacity in METs is: 5.19 according to the Duke Activity Status Index (DASI). The patient is doing well from a cardiac perspective. Therefore, based on ACC/AHA guidelines, the  patient would be at acceptable risk for the planned procedure without further cardiovascular testing.    Per office protocol, she may hold Eliquis  for 3 days prior to procedure and should resume as soon as hemodynamically stable post op.  LD Eliquis : 1/9   VS: BP (!) 147/46   Pulse (!) 57   Temp 36.6 C (Oral)   Ht 5' 6 (1.676 m)   Wt 103 kg   SpO2 96%   BMI 36.64 kg/m   PROVIDERS: Patient, No Pcp Per   LABS: Labs reviewed: Acceptable for surgery. CKD stable on pre op labs (all labs ordered are listed, but only abnormal results are displayed)  Labs Reviewed  BASIC METABOLIC PANEL WITH GFR - Abnormal; Notable for the following components:      Result Value   Glucose, Bld 101 (*)    BUN 27 (*)    Creatinine, Ser 1.40 (*)    GFR, Estimated 38 (*)    All other components within normal limits  GLUCOSE, CAPILLARY - Abnormal; Notable for the following components:   Glucose-Capillary 107 (*)    All other components within normal limits  SURGICAL PCR SCREEN  HEMOGLOBIN A1C  CBC     CCTA 12/06/23:  IMPRESSION: 1. 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   2. HALT of the prosthetic valve leaflets but no restricted leaflet motion.   3. Degenerative mitral valve with moderate mitral annular calcium . Restricted PMVL in systole noted.   4. Mildly dilated LV cavity.  Echo 11/26/2023:  IMPRESSIONS    1. Left ventricular ejection  fraction, by estimation, is 55%. The left ventricle has normal function. The left ventricle has no regional wall motion abnormalities. Left ventricular diastolic parameters are consistent with Grade I diastolic dysfunction (impaired relaxation).  2. Right ventricular systolic function is normal. The right ventricular size is normal. There is mildly elevated pulmonary artery systolic pressure. The estimated right ventricular systolic pressure is 36.4 mmHg.  3. Left atrial size  was moderately dilated.  4. The mitral valve is degenerative. Mild to moderate mitral valve regurgitation. Mild to moderate mitral stenosis. The mean mitral valve gradient is 4.6 mmHg, mean gradient 1.84 cm^2 by VTI. Moderate mitral annular calcification.  5. The aortic valve has been repaired/replaced. Eccentric peri-valvular aortic regurgitation is moderate-severe. Holodiastolic flow reversal in the descending thoracic aorta is not noted. There is a 29 mm Medtronic CoreValve-EvolutR prosthetic (TAVR) valve present in the aortic position. Procedure Date: 11/28/2022. Mean gradient 16 mmHg (prior 12 mmHg) with EOA 1.68 cm^2. DI 0.4.  6. The inferior vena cava is normal in size with greater than 50% respiratory variability, suggesting right atrial pressure of 3 mmHg. 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 Tortuous cororany arteries.   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 Verlin & Dr. Murriel.    Past Surgical History:  Procedure Laterality Date   ABDOMINAL HYSTERECTOMY  1992   complete   AORTIC VALVE SURGERY     CARDIOVERSION N/A 09/23/2020   Procedure: CARDIOVERSION;  Surgeon: Hobart Powell BRAVO, MD;  Location: [facility] ENDOSCOPY;  Service: Cardiovascular;  Laterality: N/A;   CARDIOVERSION N/A  03/21/2021   Procedure: CARDIOVERSION;  Surgeon: Santo Stanly LABOR, MD;  Location: [facility] ENDOSCOPY;  Service: Cardiovascular;  Laterality: N/A;   CARPAL TUNNEL RELEASE Right    CHOLECYSTECTOMY  1996   CYSTOSCOPY/URETEROSCOPY/HOLMIUM LASER/STENT PLACEMENT Bilateral 08/19/2020   Procedure: CYSTOSCOPY BILATERAL  RETROGRADE  URETEROSCOPY/HOLMIUM LASER/STENT PLACEMENT;  Surgeon: Watt Rush, MD;  Location: [facility];  Service: Urology;  Laterality: Bilateral;   HAMMER TOE SURGERY Right yrs ago   INTRAOPERATIVE TRANSTHORACIC ECHOCARDIOGRAM N/A 11/28/2022   Procedure: INTRAOPERATIVE TRANSTHORACIC ECHOCARDIOGRAM;  Surgeon: Verlin Lonni BIRCH, MD;  Location: [facility] OR;  Service: Open Heart Surgery;  Laterality: N/A;   IR RADIOLOGIST EVAL & MGMT  10/26/2020   IR RADIOLOGIST EVAL & MGMT  01/06/2021   IR RADIOLOGIST EVAL & MGMT  03/22/2021   IR RADIOLOGIST EVAL & MGMT  07/06/2021   IR RADIOLOGIST EVAL & MGMT  01/11/2022   IR RADIOLOGIST EVAL & MGMT  01/15/2023   IR RADIOLOGIST EVAL & MGMT  01/25/2024   LEFT HEART CATH AND  CORONARY ANGIOGRAPHY N/A 06/02/2020   Procedure: LEFT HEART CATH AND CORONARY ANGIOGRAPHY;  Surgeon: Dann Candyce RAMAN, MD;  Location: [facility] INVASIVE CV LAB;  Service: Cardiovascular;  Laterality: N/A;   RADIOLOGY WITH ANESTHESIA N/A 11/17/2020   Procedure: RADIOLOGY WITH ANESTHESIA RENAL MICROWAVE ABLATION;  Surgeon: Jennefer Ester PARAS, MD;  Location: [facility];  Service: Radiology;  Laterality: N/A;   TONSILLECTOMY  age 41   TOTAL KNEE ARTHROPLASTY Right 09/23/2024   Procedure: ARTHROPLASTY, KNEE, TOTAL;  Surgeon: Ernie Cough, MD;  Location: [facility];  Service: Orthopedics;  Laterality: Right;   TRANSCATHETER AORTIC VALVE REPLACEMENT, TRANSFEMORAL N/A 11/28/2022   Procedure: Transcatheter Aortic Valve Replacement, Transfemoral using a Medtronic 29 mm EVOLUT FX Aortic Valve;  Surgeon: Verlin Lonni BIRCH, MD;  Location: [facility];  Service: Open Heart Surgery;   Laterality: N/A;    MEDICATIONS:  amiodarone  (PACERONE ) 200 MG tablet   amoxicillin  (AMOXIL ) 500 MG capsule   Calcium  Carb-Cholecalciferol (CALCIUM  600+D) 600-800 MG-UNIT TABS   carboxymethylcellulose (REFRESH PLUS) 0.5 % SOLN   chlorhexidine  (HIBICLENS ) 4 % external liquid   ELIQUIS  5 MG TABS tablet   Hydrocortisone -Aloe Vera (CORTIZONE-10/ALOE EX)   Liniments (BLUE-EMU SUPER STRENGTH) CREA   methocarbamol  (ROBAXIN ) 500 MG tablet   metoprolol  succinate (TOPROL -XL) 25 MG 24 hr tablet   Multiple Vitamins-Minerals (ONE A DAY WOMEN 50 PLUS PO)   Multiple Vitamins-Minerals (PRESERVISION AREDS 2 PO)   oxyCODONE  (OXY IR/ROXICODONE ) 5 MG immediate release tablet   Phenol (CHLORASEPTIC MT)   polyethylene glycol (MIRALAX  / GLYCOLAX ) 17 g packet   Potassium Chloride  ER 20 MEQ TBCR   torsemide  (DEMADEX ) 20 MG tablet   No current facility-administered medications for this encounter.    Burnard CHRISTELLA Odis DEVONNA [facility] Short Stay/Anesthesiology [facility] Phone [phone] 12/25/2024 10:29 AM       "

## 2024-12-30 ENCOUNTER — Observation Stay (HOSPITAL_COMMUNITY)
Admission: RE | Admit: 2024-12-30 | Discharge: 2024-12-31 | Disposition: A | Discharge status: Home / Self Care | Attending: Orthopedic Surgery | Admitting: Orthopedic Surgery

## 2024-12-30 ENCOUNTER — Encounter (HOSPITAL_COMMUNITY): Admitting: Medical

## 2024-12-30 ENCOUNTER — Encounter (HOSPITAL_COMMUNITY): Admission: RE | Disposition: A | Payer: Self-pay | Source: Home / Self Care | Attending: Orthopedic Surgery

## 2024-12-30 ENCOUNTER — Ambulatory Visit (HOSPITAL_COMMUNITY): Admitting: Certified Registered"

## 2024-12-30 ENCOUNTER — Encounter (HOSPITAL_COMMUNITY): Payer: Self-pay | Admitting: Orthopedic Surgery

## 2024-12-30 ENCOUNTER — Other Ambulatory Visit: Payer: Self-pay

## 2024-12-30 DIAGNOSIS — I10 Essential (primary) hypertension: Secondary | ICD-10-CM | POA: Diagnosis not present

## 2024-12-30 DIAGNOSIS — Z7901 Long term (current) use of anticoagulants: Secondary | ICD-10-CM | POA: Diagnosis not present

## 2024-12-30 DIAGNOSIS — M1712 Unilateral primary osteoarthritis, left knee: Secondary | ICD-10-CM | POA: Diagnosis not present

## 2024-12-30 DIAGNOSIS — Z79899 Other long term (current) drug therapy: Secondary | ICD-10-CM | POA: Diagnosis not present

## 2024-12-30 DIAGNOSIS — N189 Chronic kidney disease, unspecified: Secondary | ICD-10-CM | POA: Diagnosis not present

## 2024-12-30 DIAGNOSIS — I2510 Atherosclerotic heart disease of native coronary artery without angina pectoris: Secondary | ICD-10-CM | POA: Diagnosis not present

## 2024-12-30 DIAGNOSIS — E039 Hypothyroidism, unspecified: Secondary | ICD-10-CM | POA: Diagnosis not present

## 2024-12-30 DIAGNOSIS — Z96652 Presence of left artificial knee joint: Principal | ICD-10-CM

## 2024-12-30 DIAGNOSIS — M25562 Pain in left knee: Secondary | ICD-10-CM | POA: Diagnosis present

## 2024-12-30 DIAGNOSIS — E1122 Type 2 diabetes mellitus with diabetic chronic kidney disease: Secondary | ICD-10-CM | POA: Insufficient documentation

## 2024-12-30 DIAGNOSIS — I130 Hypertensive heart and chronic kidney disease with heart failure and stage 1 through stage 4 chronic kidney disease, or unspecified chronic kidney disease: Secondary | ICD-10-CM | POA: Insufficient documentation

## 2024-12-30 DIAGNOSIS — E119 Type 2 diabetes mellitus without complications: Secondary | ICD-10-CM

## 2024-12-30 DIAGNOSIS — Z85528 Personal history of other malignant neoplasm of kidney: Secondary | ICD-10-CM | POA: Insufficient documentation

## 2024-12-30 DIAGNOSIS — I5031 Acute diastolic (congestive) heart failure: Secondary | ICD-10-CM | POA: Insufficient documentation

## 2024-12-30 HISTORY — PX: TOTAL KNEE ARTHROPLASTY: SHX125

## 2024-12-30 LAB — GLUCOSE, CAPILLARY: Glucose-Capillary: 110 mg/dL — ABNORMAL HIGH (ref 70–99)

## 2024-12-30 MED ORDER — BISACODYL 10 MG RE SUPP: 10.0000 mg | Freq: Every day | RECTAL | Status: DC | PRN

## 2024-12-30 MED ORDER — PROPOFOL 500 MG/50ML IV EMUL
INTRAVENOUS | Status: DC | PRN
  Administered 2024-12-30: 100 ug/kg/min via INTRAVENOUS

## 2024-12-30 MED ORDER — POLYETHYLENE GLYCOL 3350 17 G PO PACK
17.0000 g | PACK | Freq: Two times a day (BID) | ORAL | Status: DC
  Administered 2024-12-30: 17 g via ORAL
  Filled 2024-12-30 (×2): qty 1

## 2024-12-30 MED ORDER — KETOROLAC TROMETHAMINE 30 MG/ML IJ SOLN
INTRAMUSCULAR | Status: AC
  Filled 2024-12-30: qty 1

## 2024-12-30 MED ORDER — ONDANSETRON HCL 4 MG/2ML IJ SOLN: 4.0000 mg | Freq: Four times a day (QID) | INTRAMUSCULAR | Status: DC | PRN

## 2024-12-30 MED ORDER — ACETAMINOPHEN 500 MG PO TABS
1000.0000 mg | ORAL_TABLET | Freq: Once | ORAL | Status: AC
  Administered 2024-12-30: 1000 mg via ORAL
  Filled 2024-12-30: qty 2

## 2024-12-30 MED ORDER — PROPOFOL 10 MG/ML IV BOLUS
INTRAVENOUS | Status: AC
  Filled 2024-12-30: qty 20

## 2024-12-30 MED ORDER — ONDANSETRON HCL 4 MG/2ML IJ SOLN
INTRAMUSCULAR | Status: AC
  Filled 2024-12-30: qty 2

## 2024-12-30 MED ORDER — PROPOFOL 10 MG/ML IV BOLUS
INTRAVENOUS | Status: DC | PRN
  Administered 2024-12-30 (×2): 20 mg via INTRAVENOUS

## 2024-12-30 MED ORDER — CHLORHEXIDINE GLUCONATE 0.12 % MT SOLN
15.0000 mL | Freq: Once | OROMUCOSAL | Status: AC
  Administered 2024-12-30: 15 mL via OROMUCOSAL

## 2024-12-30 MED ORDER — TRANEXAMIC ACID-NACL 1000-0.7 MG/100ML-% IV SOLN
1000.0000 mg | Freq: Once | INTRAVENOUS | Status: AC
  Administered 2024-12-30: 1000 mg via INTRAVENOUS
  Filled 2024-12-30: qty 100

## 2024-12-30 MED ORDER — TRANEXAMIC ACID-NACL 1000-0.7 MG/100ML-% IV SOLN
1000.0000 mg | INTRAVENOUS | Status: AC
  Administered 2024-12-30: 1000 mg via INTRAVENOUS
  Filled 2024-12-30: qty 100

## 2024-12-30 MED ORDER — OXYCODONE HCL 5 MG PO TABS: 5.0000 mg | ORAL_TABLET | Freq: Once | ORAL | Status: DC | PRN

## 2024-12-30 MED ORDER — ALUM & MAG HYDROXIDE-SIMETH 200-200-20 MG/5ML PO SUSP: 30.0000 mL | ORAL | Status: DC | PRN

## 2024-12-30 MED ORDER — FENTANYL CITRATE (PF) 50 MCG/ML IJ SOSY
50.0000 ug | PREFILLED_SYRINGE | Freq: Once | INTRAMUSCULAR | Status: AC
  Administered 2024-12-30: 50 ug via INTRAVENOUS
  Filled 2024-12-30: qty 2

## 2024-12-30 MED ORDER — APIXABAN 5 MG PO TABS
5.0000 mg | ORAL_TABLET | Freq: Two times a day (BID) | ORAL | Status: DC
  Administered 2024-12-31: 5 mg via ORAL
  Filled 2024-12-30: qty 1

## 2024-12-30 MED ORDER — DEXAMETHASONE SOD PHOSPHATE PF 10 MG/ML IJ SOLN
10.0000 mg | Freq: Once | INTRAMUSCULAR | Status: DC
  Filled 2024-12-30: qty 1

## 2024-12-30 MED ORDER — OXYCODONE HCL 5 MG PO TABS
5.0000 mg | ORAL_TABLET | ORAL | Status: DC | PRN
  Administered 2024-12-31: 5 mg via ORAL
  Filled 2024-12-30: qty 1

## 2024-12-30 MED ORDER — AMIODARONE HCL 200 MG PO TABS
200.0000 mg | ORAL_TABLET | Freq: Every day | ORAL | Status: DC
  Administered 2024-12-30 – 2024-12-31 (×2): 200 mg via ORAL
  Filled 2024-12-30 (×2): qty 1

## 2024-12-30 MED ORDER — METHOCARBAMOL 500 MG PO TABS
500.0000 mg | ORAL_TABLET | Freq: Four times a day (QID) | ORAL | Status: DC | PRN
  Administered 2024-12-30: 500 mg via ORAL
  Filled 2024-12-30: qty 1

## 2024-12-30 MED ORDER — BUPIVACAINE-EPINEPHRINE (PF) 0.25% -1:200000 IJ SOLN
INTRAMUSCULAR | Status: AC
  Filled 2024-12-30: qty 30

## 2024-12-30 MED ORDER — ORAL CARE MOUTH RINSE: 15.0000 mL | Freq: Once | OROMUCOSAL | Status: AC

## 2024-12-30 MED ORDER — SODIUM CHLORIDE 0.9 % IR SOLN
Status: DC | PRN
  Administered 2024-12-30: 1000 mL

## 2024-12-30 MED ORDER — POVIDONE-IODINE 10 % EX SWAB: 2.0000 | Freq: Once | CUTANEOUS | Status: DC

## 2024-12-30 MED ORDER — PHENOL 1.4 % MT LIQD: 1.0000 | OROMUCOSAL | Status: DC | PRN

## 2024-12-30 MED ORDER — MENTHOL 3 MG MT LOZG: 1.0000 | LOZENGE | OROMUCOSAL | Status: DC | PRN

## 2024-12-30 MED ORDER — HYDROMORPHONE HCL 1 MG/ML IJ SOLN: 0.2500 mg | INTRAMUSCULAR | Status: DC | PRN

## 2024-12-30 MED ORDER — CEFAZOLIN SODIUM-DEXTROSE 2-4 GM/100ML-% IV SOLN
2.0000 g | Freq: Four times a day (QID) | INTRAVENOUS | Status: AC
  Administered 2024-12-30 (×2): 2 g via INTRAVENOUS
  Filled 2024-12-30 (×2): qty 100

## 2024-12-30 MED ORDER — DEXAMETHASONE SOD PHOSPHATE PF 10 MG/ML IJ SOLN
INTRAMUSCULAR | Status: AC
  Filled 2024-12-30: qty 1

## 2024-12-30 MED ORDER — OXYCODONE HCL 5 MG PO TABS: 10.0000 mg | ORAL_TABLET | ORAL | Status: DC | PRN

## 2024-12-30 MED ORDER — 0.9 % SODIUM CHLORIDE (POUR BTL) OPTIME
TOPICAL | Status: DC | PRN
  Administered 2024-12-30: 1000 mL

## 2024-12-30 MED ORDER — OXYCODONE HCL 5 MG/5ML PO SOLN: 5.0000 mg | Freq: Once | ORAL | Status: DC | PRN

## 2024-12-30 MED ORDER — ONDANSETRON HCL 4 MG PO TABS: 4.0000 mg | ORAL_TABLET | Freq: Four times a day (QID) | ORAL | Status: DC | PRN

## 2024-12-30 MED ORDER — MIDAZOLAM HCL (PF) 2 MG/2ML IJ SOLN: 1.0000 mg | Freq: Once | INTRAMUSCULAR | Status: DC

## 2024-12-30 MED ORDER — METOPROLOL SUCCINATE ER 25 MG PO TB24
25.0000 mg | ORAL_TABLET | Freq: Every day | ORAL | Status: DC
  Administered 2024-12-31: 25 mg via ORAL
  Filled 2024-12-30: qty 1

## 2024-12-30 MED ORDER — STERILE WATER FOR IRRIGATION IR SOLN
Status: DC | PRN
  Administered 2024-12-30: 2000 mL

## 2024-12-30 MED ORDER — ONDANSETRON HCL 4 MG/2ML IJ SOLN
INTRAMUSCULAR | Status: DC | PRN
  Administered 2024-12-30: 4 mg via INTRAVENOUS

## 2024-12-30 MED ORDER — METOCLOPRAMIDE HCL 5 MG/ML IJ SOLN: 5.0000 mg | Freq: Three times a day (TID) | INTRAMUSCULAR | Status: DC | PRN

## 2024-12-30 MED ORDER — METOCLOPRAMIDE HCL 5 MG PO TABS: 5.0000 mg | ORAL_TABLET | Freq: Three times a day (TID) | ORAL | Status: DC | PRN

## 2024-12-30 MED ORDER — POTASSIUM CHLORIDE CRYS ER 20 MEQ PO TBCR
40.0000 meq | EXTENDED_RELEASE_TABLET | Freq: Every day | ORAL | Status: DC
  Administered 2024-12-31: 40 meq via ORAL
  Filled 2024-12-30: qty 2

## 2024-12-30 MED ORDER — ACETAMINOPHEN 500 MG PO TABS
1000.0000 mg | ORAL_TABLET | Freq: Four times a day (QID) | ORAL | Status: DC
  Administered 2024-12-30 – 2024-12-31 (×4): 1000 mg via ORAL
  Filled 2024-12-30 (×4): qty 2

## 2024-12-30 MED ORDER — PROPOFOL 1000 MG/100ML IV EMUL
INTRAVENOUS | Status: AC
  Filled 2024-12-30: qty 100

## 2024-12-30 MED ORDER — METHOCARBAMOL 1000 MG/10ML IJ SOLN: 500.0000 mg | Freq: Four times a day (QID) | INTRAMUSCULAR | Status: DC | PRN

## 2024-12-30 MED ORDER — SODIUM CHLORIDE 0.9 % IV SOLN: INTRAVENOUS | Status: DC

## 2024-12-30 MED ORDER — MIDAZOLAM HCL (PF) 2 MG/2ML IJ SOLN: 0.5000 mg | Freq: Once | INTRAMUSCULAR | Status: DC | PRN

## 2024-12-30 MED ORDER — SODIUM CHLORIDE (PF) 0.9 % IJ SOLN
INTRAMUSCULAR | Status: DC | PRN
  Administered 2024-12-30: 61 mL

## 2024-12-30 MED ORDER — DEXAMETHASONE SOD PHOSPHATE PF 10 MG/ML IJ SOLN
8.0000 mg | Freq: Once | INTRAMUSCULAR | Status: AC
  Administered 2024-12-30: 8 mg via INTRAVENOUS

## 2024-12-30 MED ORDER — MEPIVACAINE HCL (PF) 2 % IJ SOLN
INTRAMUSCULAR | Status: DC | PRN
  Administered 2024-12-30: 60 mg via INTRATHECAL

## 2024-12-30 MED ORDER — PHENYLEPHRINE HCL-NACL 20-0.9 MG/250ML-% IV SOLN
INTRAVENOUS | Status: DC | PRN
  Administered 2024-12-30: 20 ug/min via INTRAVENOUS

## 2024-12-30 MED ORDER — CEFAZOLIN SODIUM-DEXTROSE 2-4 GM/100ML-% IV SOLN
2.0000 g | INTRAVENOUS | Status: AC
  Administered 2024-12-30: 2 g via INTRAVENOUS
  Filled 2024-12-30: qty 100

## 2024-12-30 MED ORDER — SENNA 8.6 MG PO TABS
2.0000 | ORAL_TABLET | Freq: Every day | ORAL | Status: DC
  Administered 2024-12-30: 17.2 mg via ORAL
  Filled 2024-12-30: qty 2

## 2024-12-30 MED ORDER — LIDOCAINE HCL (CARDIAC) PF 100 MG/5ML IV SOSY
PREFILLED_SYRINGE | INTRAVENOUS | Status: DC | PRN
  Administered 2024-12-30: 50 mg via INTRAVENOUS

## 2024-12-30 MED ORDER — DIPHENHYDRAMINE HCL 12.5 MG/5ML PO ELIX: 12.5000 mg | ORAL_SOLUTION | ORAL | Status: DC | PRN

## 2024-12-30 MED ORDER — LACTATED RINGERS IV SOLN: INTRAVENOUS | Status: DC

## 2024-12-30 MED ORDER — ROPIVACAINE HCL 7.5 MG/ML IJ SOLN
INTRAMUSCULAR | Status: DC | PRN
  Administered 2024-12-30: 20 mL via PERINEURAL

## 2024-12-30 MED ORDER — HYDROMORPHONE HCL 1 MG/ML IJ SOLN: 0.5000 mg | INTRAMUSCULAR | Status: DC | PRN

## 2024-12-30 MED ORDER — TORSEMIDE 20 MG PO TABS
20.0000 mg | ORAL_TABLET | Freq: Two times a day (BID) | ORAL | Status: DC
  Administered 2024-12-31: 20 mg via ORAL
  Filled 2024-12-30: qty 1

## 2024-12-30 MED ORDER — SODIUM CHLORIDE (PF) 0.9 % IJ SOLN
INTRAMUSCULAR | Status: AC
  Filled 2024-12-30: qty 30

## 2024-12-30 NOTE — Anesthesia Procedure Notes (Signed)
 Spinal  Patient location during procedure: OR Start time: 12/30/2024 10:03 AM Reason for block: surgical anesthesia  Staffing Performed: resident/CRNA  Authorized by: Leonce Athens, MD   Performed by: Metta Andrea NOVAK, CRNA  Preanesthetic Checklist Completed: patient identified, IV checked, site marked, risks and benefits discussed, surgical consent, monitors and equipment checked, pre-op evaluation and timeout performed Spinal Block Patient position: sitting Prep: DuraPrep and site prepped and draped Patient monitoring: heart rate, cardiac monitor, continuous pulse ox and blood pressure Approach: midline Location: L3-4 Injection technique: single-shot Needle Needle type: Pencan  Needle gauge: 24 G Needle length: 10 cm Assessment Events: CSF return  Additional Notes Pt placed in sitting position, spinal kit expiration date checked and verified, timeout performed, Dr Leonce present and supervising throughout SAB placement. + CSF, - heme, pt tolerated well.

## 2024-12-30 NOTE — Anesthesia Procedure Notes (Signed)
 Anesthesia Regional Block: Adductor canal block   Pre-Anesthetic Checklist: , timeout performed,  Correct Patient, Correct Site, Correct Laterality,  Correct Procedure, Correct Position, site marked,  Risks and benefits discussed,  Surgical consent,  Pre-op evaluation,  At surgeon's request and post-op pain management  Laterality: Left and Lower  Prep: chloraprep       Needles:  Injection technique: Single-shot  Needle Type: Echogenic Needle     Needle Length: 9cm  Needle Gauge: 21     Additional Needles:   Procedures:,,,, ultrasound used (permanent image in chart),,    Narrative:  Start time: 12/30/2024 9:27 AM End time: 12/30/2024 9:33 AM Injection made incrementally with aspirations every 5 mL.  Performed by: Personally  Anesthesiologist: Leonce Athens, MD  Additional Notes: Pt identified in Holding room.  Monitors applied. Working IV access confirmed. Timeout, Sterile prep L thigh.  #21ga ECHOgenic Arrow block needle into adductor canal with US  guidance.  20cc 0.75% Ropivacaine  injected incrementally after negative test dose.  Patient asymptomatic, VSS, no heme aspirated, tolerated well.   JAYSON Leonce, MD

## 2024-12-30 NOTE — H&P (Signed)
 TOTAL KNEE ADMISSION H&P  Patient is being admitted for left total knee arthroplasty.  Therapy Plans: outpatient therapy at [facility] Disposition: Home with husband Planned DVT Prophylaxis: Eliquis  5 mg BID DME needed: none PCP: recently retired Cardio: Dr. Verlin TXA: IV Allergies: NKDA Anesthesia Concerns: none BMI: 38.8 Last HgbA1c: 5.3%     Other: - Recent right TKA - reports no pain post op - staying overnight - oxycodone , robaxin  ,tylenol  - Husband had both TKAs by Aluisio, daughter by Ernie - they all know the surgery well  Subjective:  Chief Complaint: Left knee pain.  HPI: [name] [name], 84 y.o. female has a history of pain and functional disability in the left knee due to osteoarthritis and has failed non-surgical conservative treatments for greater than 12 weeks to include NSAID's and/or analgesics, corticosteriod injections, and activity modification. Onset of symptoms was gradual, starting 2 years ago with gradually worsening course since that time. The patient noted no past surgery on the left knee.  Patient currently rates pain in the left knee at 7 out of 10 with activity. Patient has worsening of pain with activity and weight bearing and pain that interferes with activities of daily living. Patient has evidence of joint space narrowing by imaging studies. There is no active infection.  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 Tortuous cororany arteries.   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 Verlin & Dr. Murriel.    Past Surgical History:  Procedure Laterality Date   ABDOMINAL HYSTERECTOMY  1992   complete   AORTIC VALVE SURGERY     CARDIOVERSION N/A 09/23/2020   Procedure: CARDIOVERSION;  Surgeon: Hobart Powell BRAVO, MD;  Location: [facility] ENDOSCOPY;  Service: Cardiovascular;  Laterality: N/A;   CARDIOVERSION N/A 03/21/2021   Procedure: CARDIOVERSION;  Surgeon: Santo Stanly LABOR, MD;  Location: [facility] ENDOSCOPY;  Service: Cardiovascular;  Laterality: N/A;   CARPAL TUNNEL RELEASE Right    CHOLECYSTECTOMY  1996  CYSTOSCOPY/URETEROSCOPY/HOLMIUM LASER/STENT PLACEMENT Bilateral 08/19/2020   Procedure: CYSTOSCOPY BILATERAL  RETROGRADE  URETEROSCOPY/HOLMIUM LASER/STENT PLACEMENT;  Surgeon: Watt Rush, MD;  Location: [facility];  Service: Urology;  Laterality: Bilateral;   HAMMER TOE SURGERY Right yrs ago   INTRAOPERATIVE TRANSTHORACIC ECHOCARDIOGRAM N/A 11/28/2022   Procedure: INTRAOPERATIVE TRANSTHORACIC ECHOCARDIOGRAM;  Surgeon: Verlin Lonni BIRCH, MD;  Location: [facility]  OR;  Service: Open Heart Surgery;  Laterality: N/A;   IR RADIOLOGIST EVAL & MGMT  10/26/2020   IR RADIOLOGIST EVAL & MGMT  01/06/2021   IR RADIOLOGIST EVAL & MGMT  03/22/2021   IR RADIOLOGIST EVAL & MGMT  07/06/2021   IR RADIOLOGIST EVAL & MGMT  01/11/2022   IR RADIOLOGIST EVAL & MGMT  01/15/2023   IR RADIOLOGIST EVAL & MGMT  01/25/2024   LEFT HEART CATH AND CORONARY ANGIOGRAPHY N/A 06/02/2020   Procedure: LEFT HEART CATH AND CORONARY ANGIOGRAPHY;  Surgeon: Dann Candyce RAMAN, MD;  Location: [facility] INVASIVE CV LAB;  Service: Cardiovascular;  Laterality: N/A;   RADIOLOGY WITH ANESTHESIA N/A 11/17/2020   Procedure: RADIOLOGY WITH ANESTHESIA RENAL MICROWAVE ABLATION;  Surgeon: Jennefer Ester PARAS, MD;  Location: [facility] ORS;  Service: Radiology;  Laterality: N/A;   TONSILLECTOMY  age 68   TOTAL KNEE ARTHROPLASTY Right 09/23/2024   Procedure: ARTHROPLASTY, KNEE, TOTAL;  Surgeon: Ernie Cough, MD;  Location: [facility] ORS;  Service: Orthopedics;  Laterality: Right;   TRANSCATHETER AORTIC VALVE REPLACEMENT, TRANSFEMORAL N/A 11/28/2022   Procedure: Transcatheter Aortic Valve Replacement, Transfemoral using a Medtronic 29 mm EVOLUT FX Aortic Valve;  Surgeon: Verlin Lonni BIRCH, MD;  Location: [facility] OR;  Service: Open Heart Surgery;  Laterality: N/A;    Prior to Admission medications  Medication Sig Start Date End Date Taking? Authorizing Provider  amiodarone  (PACERONE ) 200 MG tablet Take 1 tablet (200 mg total) by mouth daily. 05/06/24  Yes Verlin Lonni BIRCH, MD  amoxicillin  (AMOXIL ) 500 MG capsule Take 4 capsules (2,000 mg total) by mouth as directed. Take 4 tablets 1 hour prior to dental work, including cleanings. 08/13/24  Yes Verlin Lonni BIRCH, MD  Calcium  Carb-Cholecalciferol (CALCIUM  600+D) 600-800 MG-UNIT TABS Take 1 tablet by mouth daily.   Yes [provider]  carboxymethylcellulose (REFRESH PLUS) 0.5 % SOLN Place 1 drop into both eyes 3 (three) times daily as needed (dry eyes).   Yes  [provider]  chlorhexidine  (HIBICLENS ) 4 % external liquid Apply 15 mLs (1 Application total) topically as directed for 30 doses. Use as directed daily for 5 days every other week for 6 weeks. 09/23/24  Yes Patti Rosina SAUNDERS, PA-C  ELIQUIS  5 MG TABS tablet TAKE 1 TABLET BY MOUTH TWICE A DAY 05/05/24  Yes Verlin Lonni BIRCH, MD  Hydrocortisone -Aloe Vera (CORTIZONE-10/ALOE EX) Apply 1 application topically 2 (two) times daily as needed (itching).   Yes [provider]  Liniments (BLUE-EMU SUPER STRENGTH) CREA Apply 1 application  topically daily as needed (arthritis).   Yes [provider]  methocarbamol  (ROBAXIN ) 500 MG tablet Take 1 tablet (500 mg total) by mouth every 6 (six) hours as needed for muscle spasms. Patient taking differently: Take 500 mg by mouth daily as needed for muscle spasms. 09/24/24  Yes Patti Rosina SAUNDERS, PA-C  metoprolol  succinate (TOPROL -XL) 25 MG 24 hr tablet Take 1 tablet (25 mg total) by mouth daily. 03/13/24  Yes Verlin Lonni BIRCH, MD  Multiple Vitamins-Minerals (ONE A DAY WOMEN 50 PLUS PO) Take 1 tablet by mouth daily.   Yes [provider]  Multiple  Vitamins-Minerals (PRESERVISION AREDS 2 PO) Take 1 capsule by mouth 2 (two) times daily.    Yes [provider]  oxyCODONE  (OXY IR/ROXICODONE ) 5 MG immediate release tablet Take 1 tablet (5 mg total) by mouth every 4 (four) hours as needed for severe pain (pain score 7-10). 09/24/24  Yes Patti Rosina SAUNDERS, PA-C  Phenol [facility] [place]) Use as directed 1 spray in the mouth or throat daily as needed (throat pain).   Yes [provider]  Potassium Chloride  ER 20 MEQ TBCR Take 2 tablets (40 mEq total) by mouth daily. 03/10/24  Yes Verlin Lonni BIRCH, MD  torsemide  (DEMADEX ) 20 MG tablet Take 1 tablet (20 mg total) by mouth 2 (two) times daily. 09/16/24  Yes Verlin Lonni BIRCH, MD  polyethylene glycol (MIRALAX  / GLYCOLAX ) 17 g packet Take 17 g by mouth 2 (two)  times daily. Patient not taking: Reported on 12/24/2024 09/24/24   Patti Rosina SAUNDERS, PA-C    Allergies[1]  Social History   Socioeconomic History   Marital status: Married    Spouse name: Not on file   Number of children: 5   Years of education: Not on file   Highest education level: Not on file  Occupational History   Occupation: Retired-pastor  Tobacco Use   Smoking status: Never   Smokeless tobacco: Never  Vaping Use   Vaping status: Never Used  Substance and Sexual Activity   Alcohol use: Never   Drug use: Never   Sexual activity: Not Currently    Birth control/protection: Surgical  Other Topics Concern   Not on file  Social History Narrative   Not on file   Social Drivers of Health   Tobacco Use: Low Risk (12/24/2024)   Patient History    Smoking Tobacco Use: Never    Smokeless Tobacco Use: Never    Passive Exposure: Not on file  Financial Resource Strain: Not on file  Food Insecurity: No Food Insecurity (09/23/2024)   Epic    Worried About Programme Researcher, Broadcasting/film/video in the Last Year: Never true    Ran Out of Food in the Last Year: Never true  Transportation Needs: No Transportation Needs (09/23/2024)   Epic    Lack of Transportation (Medical): No    Lack of Transportation (Non-Medical): No  Physical Activity: Not on file  Stress: Not on file  Social Connections: Socially Integrated (09/23/2024)   Social Connection and Isolation Panel    Frequency of Communication with Friends and Family: Three times a week    Frequency of Social Gatherings with Friends and Family: Three times a week    Attends Religious Services: More than 4 times per year    Active Member of Clubs or Organizations: Patient unable to answer    Attends Club or Organization Meetings: 1 to 4 times per year    Marital Status: Married  Catering Manager Violence: Not At Risk (09/23/2024)   Epic    Fear of Current or Ex-Partner: No    Emotionally Abused: No    Physically Abused: No    Sexually Abused: No   Depression (PHQ2-9): Not on file  Alcohol Screen: Not on file  Housing: Low Risk (09/23/2024)   Epic    Unable to Pay for Housing in the Last Year: No    Number of Times Moved in the Last Year: 0    Homeless in the Last Year: No  Utilities: Not At Risk (09/23/2024)   Epic    Threatened with loss of utilities:  No  Health Literacy: Not on file    Tobacco Use: Low Risk (12/24/2024)   Patient History    Smoking Tobacco Use: Never    Smokeless Tobacco Use: Never    Passive Exposure: Not on file   Social History   Substance and Sexual Activity  Alcohol Use Never    Family History  Problem Relation Age of Onset   Hypertension Mother    Hypertension Father    Diabetes Paternal Grandmother     Review Of Systems: Constitutional: Constitutional: no fever, chills, night sweats, or significant weight loss. Cardiovascular: Cardiovascular: no palpitations or chest pain. Respiratory: Respiratory: no cough or shortness of breath and No COPD. Gastrointestinal: Gastrointestinal: no vomiting or nausea. Musculoskeletal: Musculoskeletal: Joint Pain and swelling in Joints. Neurologic: Neurologic: no numbness, tingling, or difficulty with balance.  Objective:  Physical Exam: Right knee exam: Her surgical incision is healed without signs of infection Slight flexion contracture related to her knee replacement surgery but also that confounded by her left knee osteoarthritis and flexion contracture She flexes close to 110 degrees No significant lower extremity edema, erythema or calf tenderness  Left knee exam: No significant knee effusion warmth erythema Flexion contracture related to her osteoarthritis with tenderness over the medial and anterior aspect knee  Vital signs in last 24 hours:    Imaging Review Plain radiographs demonstrate severe degenerative joint disease of the left knee.  The bone quality appears to be adequate for age and reported activity  level.  Assessment/Plan:  End stage arthritis, left knee   The patient history, physical examination, clinical judgment of the provider and imaging studies are consistent with end stage degenerative joint disease of the left knee and total knee arthroplasty is deemed medically necessary. The treatment options including medical management, injection therapy arthroscopy and arthroplasty were discussed at length. The risks and benefits of total knee arthroplasty were presented and reviewed. The risks due to aseptic loosening, infection, stiffness, patella tracking problems, thromboembolic complications and other imponderables were discussed. The patient acknowledged the explanation, agreed to proceed with the plan and consent was signed. Patient is being admitted for inpatient treatment for surgery, pain control, PT, OT, prophylactic antibiotics, VTE prophylaxis, progressive ambulation and ADLs and discharge planning. The patient is planning to be discharged home.   Patient's anticipated LOS is less than 2 midnights, meeting these requirements: - Younger than 56 - Lives within 1 hour of care - Has a competent adult at home to recover with post-op recover - NO history of  - Chronic pain requiring opiods  - Diabetes  - Coronary Artery Disease  - Heart failure  - Heart attack  - Stroke  - DVT/VTE  - Cardiac arrhythmia  - Respiratory Failure/COPD  - Renal failure  - Anemia  - Advanced Liver disease    Rosina Calin, PA-C Orthopedic Surgery [facility] [place] [phone]      [1]  Allergies Allergen Reactions   Tetanus-Diphtheria Toxoids Td Other (See Comments)    Felt very sick   Cardizem  [Diltiazem ] Rash

## 2024-12-30 NOTE — Anesthesia Procedure Notes (Signed)
 Procedure Name: MAC Date/Time: 12/30/2024 9:54 AM  Performed by: Metta Andrea NOVAK, CRNAPre-anesthesia Checklist: Patient identified, Emergency Drugs available, Suction available, Patient being monitored and Timeout performed Oxygen Delivery Method: Simple face mask Placement Confirmation: positive ETCO2

## 2024-12-30 NOTE — Plan of Care (Signed)
" °  Problem: Education: Goal: Knowledge of the prescribed therapeutic regimen will improve Outcome: Progressing   Problem: Activity: Goal: Risk for activity intolerance will decrease Outcome: Progressing   Problem: Pain Managment: Goal: General experience of comfort will improve and/or be controlled Outcome: Progressing   "

## 2024-12-30 NOTE — Interval H&P Note (Signed)
 History and Physical Interval Note:  12/30/2024 8:37 AM  [name]  has presented today for surgery, with the diagnosis of left knee osteoarthritis.  The various methods of treatment have been discussed with the patient and family. After consideration of risks, benefits and other options for treatment, the patient has consented to  Procedures: ARTHROPLASTY, KNEE, TOTAL (Left) as a surgical intervention.  [name] history has been reviewed, patient examined, no change in status, stable for surgery.  I have reviewed the patient's chart and labs.  Questions were answered to the patient's satisfaction.     [name]

## 2024-12-30 NOTE — Discharge Instructions (Signed)

## 2024-12-30 NOTE — Transfer of Care (Signed)
 Immediate Anesthesia Transfer of Care Note  Patient: [name] [name]  Procedure(s) Performed: ARTHROPLASTY, KNEE, TOTAL (Left: Knee)  Patient Location: PACU  Anesthesia Type:Spinal  Level of Consciousness: awake, alert , and patient cooperative  Airway & Oxygen Therapy: Patient Spontanous Breathing and Patient connected to face mask oxygen  Post-op Assessment: Report given to RN and Post -op Vital signs reviewed and stable  Post vital signs: Reviewed and stable  Last Vitals:  Vitals Value Taken Time  BP 132/51 12/30/24 11:45  Temp    Pulse 49 12/30/24 11:47  Resp 16 12/30/24 11:47  SpO2 95 % 12/30/24 11:47  Vitals shown include unfiled device data.  Last Pain:  Vitals:   12/30/24 0759  TempSrc: Oral  PainSc: 0-No pain         Complications: No notable events documented.

## 2024-12-30 NOTE — Anesthesia Postprocedure Evaluation (Signed)
"   Anesthesia Post Note  Patient: [name] [name]  Procedure(s) Performed: ARTHROPLASTY, KNEE, TOTAL (Left: Knee)     Patient location during evaluation: PACU Anesthesia Type: Spinal Level of consciousness: oriented, patient cooperative and awake and alert Pain management: pain level controlled Vital Signs Assessment: post-procedure vital signs reviewed and stable Respiratory status: spontaneous breathing, nonlabored ventilation and respiratory function stable Cardiovascular status: blood pressure returned to baseline and stable Postop Assessment: no backache, spinal receding, patient able to bend at knees and no apparent nausea or vomiting Anesthetic complications: no   No notable events documented.  Last Vitals:  Vitals:   12/30/24 1330 12/30/24 1345  BP: (!) 168/58 (!) 157/67  Pulse: (!) 52 (!) 55  Resp: 13 15  Temp:  (!) 36.3 C  SpO2: 99% 97%    Last Pain:  Vitals:   12/30/24 1345  TempSrc:   PainSc: 0-No pain                 Hermena Swint,E. Khamari Sheehan      "

## 2024-12-30 NOTE — Care Plan (Signed)
 Ortho Bundle Case Management Note  Patient Details  Name: [name] [name] MRN: [id number] Date of Birth: Apr 23, 1941  L TKA on 12/30/24  DCP: Home with husband  DME: No needs  PT: [facility]                   DME Arranged:  N/A DME Agency:  NA  HH Arranged:    HH Agency:     Additional Comments: Please contact me with any questions of if this plan should need to change.  Lyle Pepper, CCM [facility] [phone]  Ext. [phone]   12/30/2024, 7:54 AM

## 2024-12-30 NOTE — Op Note (Signed)
 " NAME:  [name] [name]                      MEDICAL RECORD NO.:  [id number]                             FACILITY:  [facility]      PHYSICIAN:  Donnice BIRCH. Ernie, M.D.  DATE OF BIRTH:  September 24, 1941      DATE OF PROCEDURE:  12/30/2024                                     OPERATIVE REPORT         PREOPERATIVE DIAGNOSIS:  left knee osteoarthritis.      POSTOPERATIVE DIAGNOSIS:  {left knee osteoarthritis.      FINDINGS:  The patient was noted to have complete loss of cartilage and   bone-on-bone arthritis with associated osteophytes in the medial and patellofemoral compartments of   the knee with a significant synovitis and associated effusion.  The patient had failed months of conservative treatment including medications, injection therapy, activity modification.     PROCEDURE:  left total knee replacement.      COMPONENTS USED:  DePuy Attune fixed bearing cruciate retaining medial stabilized knee   system, a size 5N femur, 6 tibia, size 6 mm CR MS AOX insert, and 35 anatomic patellar   button.      SURGEON:  Donnice BIRCH. Ernie, M.D.      ASSISTANT:  Rosina Calin, PA-C.      ANESTHESIA:  Regional and Spinal.      SPECIMENS:  None.      COMPLICATION:  None.      DRAINS:  None.  EBL: <300 cc      TOURNIQUET TIME:  tourniquet was not used      The patient was stable to the recovery room.      INDICATION FOR PROCEDURE:  [name] [name] is a 84 y.o. female patient of   mine.  The patient had been seen, evaluated, and treated for months conservatively in the   office with medication, activity modification, and injections.  The patient had   radiographic changes of complete loss of joint space with endplate sclerosis and osteophytes noted.  Based on the radiographic changes and failed conservative measures, the patient   decided to proceed with total knee replacement as definitive treatment.  Risks of infection, DVT, component failure, stiffness and the need for revision surgery, neurovascular  injury were reviewed in the office setting.  The postop course was reviewed stressing the efforts to maximize post-operative range of motion, satisfaction and function.  Consent was obtained for benefit of pain   relief.      PROCEDURE IN DETAIL:  The patient was brought to the operative theater.   Once adequate anesthesia, preoperative antibiotics, 2 gm of Ancef ,1 gm of Tranexamic Acid , and 10 mg of Decadron  administered, the patient was positioned supine with bony prominences padded and protected.  The  left lower extremity was prepped and draped in sterile fashion.  A time-   out was performed identifying the patient, planned procedure, and the appropriate extremity.      The left lower extremity was placed in the [facility] leg holder.  A midline incision was   made followed by median parapatellar arthrotomy.  Following initial   exposure,  attention was first directed to the patella.  Precut   measurement was noted to be 24 mm.  I resected down to 14 mm and used a   35 anatomic patellar button to restore patellar height as well as cover the cut surface.  Limited lateral facetecomy was performed.     The lug holes were drilled and a metal shim was placed to protect the   patella from retractors and saw blade during the procedure.      At this point, attention was now directed to the femur.  The femoral   canal was opened with a drill, irrigated to try to prevent fat emboli.  An   intramedullary rod was passed at 3 degrees valgus, 11 mm of bone was   resected off the distal femur.  Following this resection, the tibia was   subluxated anteriorly.  Using the extramedullary guide, 2 mm of bone was resected off   the proximal medial tibia.  We confirmed the gap would be   stable medially and laterally with a size 5 spacer block as well as confirmed that the tibial cut was perpendicular in the coronal plane, checking with an alignment rod.      Once this was done, I sized the femur to be a size 5  in the anterior-   posterior dimension and chose a narrow component based on medial and   lateral dimension.  The size 5 rotation block was then pinned in   position anterior referenced using the C-clamp to set rotation.  The   anterior, posterior, and  chamfer cuts were made without difficulty nor   notching making certain that I was along the anterior cortex to help   with flexion gap stability.      The final femoral shim cut was made off the lateral aspect of distal femur.      At this point, the tibia was sized to be a size 6.  The size 6 tray was   then pinned in position through the medial third of the tubercle,   drilled, and keel punched.  Trial reduction was now carried with a 5 femur,  6 tibia, a size 6 mm CR insert, and the 35 anatomic patella botton.  The knee was brought to full extension with good flexion stability with the patella tracking through the trochlea without application of pressure.  Given   all these findings the trial components removed.  Final components were   opened and cement was mixed.  The knee was irrigated with normal saline solution and pulse lavage.  The posterior synovial capsule was then injected with 30 cc of 0.25% Marcaine  with epinephrine , 1 cc of Toradol  and 30 cc of NS for a total of 61 cc.     Final implants were then cemented onto cleaned and dried cut surfaces of bone with the knee brought to extension with a size 6 mm CR trial insert.      Once the cement had fully cured, excessive cement was removed   throughout the knee.  I confirmed that I was satisfied with the range of   motion and stability, and the final size 6 mm CR MS AOX insert was chosen and it was impacted into the tibial tray.     At this point in the case no significant   hemostasis was required.  The extensor mechanism was then reapproximated using #1 Vicryl and #1 Stratafix sutures with the knee   in flexion.  The  remaining wound was closed with 2-0 Vicryl and running 4-0  Monocryl.   The knee was cleaned, dried, dressed sterilely using Dermabond and   Aquacel dressing.  The patient was then   brought to recovery room in stable condition, tolerating the procedure   well.   Please note that PA Patti was present for the entirety of the case, and was utilized for pre-operative positioning, peri-operative retractor management, general facilitation of the procedure and for primary wound closure at the end of the case.              Donnice CORDOBA Ernie, M.D.    12/30/2024 11:14 AM "

## 2024-12-31 ENCOUNTER — Other Ambulatory Visit (HOSPITAL_COMMUNITY): Payer: Self-pay

## 2024-12-31 ENCOUNTER — Encounter (HOSPITAL_COMMUNITY): Payer: Self-pay | Admitting: Orthopedic Surgery

## 2024-12-31 DIAGNOSIS — M1712 Unilateral primary osteoarthritis, left knee: Secondary | ICD-10-CM | POA: Diagnosis not present

## 2024-12-31 LAB — CBC
HCT: 32.9 % — ABNORMAL LOW (ref 36.0–46.0)
Hemoglobin: 10.5 g/dL — ABNORMAL LOW (ref 12.0–15.0)
MCH: 29.3 pg (ref 26.0–34.0)
MCHC: 31.9 g/dL (ref 30.0–36.0)
MCV: 91.9 fL (ref 80.0–100.0)
Platelets: 176 K/uL (ref 150–400)
RBC: 3.58 MIL/uL — ABNORMAL LOW (ref 3.87–5.11)
RDW: 15 % (ref 11.5–15.5)
WBC: 11.5 K/uL — ABNORMAL HIGH (ref 4.0–10.5)
nRBC: 0 % (ref 0.0–0.2)

## 2024-12-31 LAB — BASIC METABOLIC PANEL WITH GFR
Anion gap: 11 (ref 5–15)
BUN: 26 mg/dL — ABNORMAL HIGH (ref 8–23)
CO2: 25 mmol/L (ref 22–32)
Calcium: 9 mg/dL (ref 8.9–10.3)
Chloride: 109 mmol/L (ref 98–111)
Creatinine, Ser: 1.42 mg/dL — ABNORMAL HIGH (ref 0.44–1.00)
GFR, Estimated: 37 mL/min — ABNORMAL LOW
Glucose, Bld: 112 mg/dL — ABNORMAL HIGH (ref 70–99)
Potassium: 4 mmol/L (ref 3.5–5.1)
Sodium: 146 mmol/L — ABNORMAL HIGH (ref 135–145)

## 2024-12-31 MED ORDER — METHOCARBAMOL 500 MG PO TABS
500.0000 mg | ORAL_TABLET | Freq: Four times a day (QID) | ORAL | 0 refills | Status: AC | PRN
  Filled 2024-12-31: qty 40, 10d supply, fill #0

## 2024-12-31 MED ORDER — ACETAMINOPHEN 500 MG PO TABS: 1000.0000 mg | ORAL_TABLET | Freq: Four times a day (QID) | ORAL | Status: AC

## 2024-12-31 NOTE — TOC Transition Note (Signed)
 Transition of Care [facility]) - Discharge Note   Patient Details  Name: [name] [name] MRN: [id number] Date of Birth: 1941-09-01  Transition of Care [facility]) CM/SW Contact:  NORMAN ASPEN, LCSW Phone Number: 12/31/2024, 10:24 AM   Clinical Narrative:     Met with patient who confirms she has needed DME in the home.  OPPT already arranged with [facility].  No further IP CM needs.  Final next level of care: OP Rehab Barriers to Discharge: No Barriers Identified   Patient Goals and CMS Choice Patient states their goals for this hospitalization and ongoing recovery are:: return home          Discharge Placement                       Discharge Plan and Services Additional resources added to the After Visit Summary for                  DME Arranged: N/A DME Agency: NA                  Social Drivers of Health (SDOH) Interventions SDOH Screenings   Food Insecurity: No Food Insecurity (12/30/2024)  Housing: Low Risk (12/30/2024)  Transportation Needs: No Transportation Needs (12/30/2024)  Utilities: Not At Risk (12/30/2024)  Social Connections: Unknown (12/30/2024)  Tobacco Use: Low Risk (12/30/2024)     Readmission Risk Interventions     No data to display

## 2024-12-31 NOTE — Progress Notes (Signed)
 "  Subjective: 1 Day Post-Op Procedures (LRB): ARTHROPLASTY, KNEE, TOTAL (Left) Patient reports pain as mild.   Patient seen in rounds for Dr. Ernie. Patient is well, and has had no acute complaints or problems. No acute events overnight. Foley catheter removed. Patient has not been up with PT yet.  We will start therapy today.   Objective: Vital signs in last 24 hours: Temp:  [97.4 F (36.3 C)-97.8 F (36.6 C)] 97.7 F (36.5 C) (01/14 0555) Pulse Rate:  [47-58] 53 (01/14 0555) Resp:  [13-21] 15 (01/14 0555) BP: (123-181)/(42-106) 123/58 (01/14 0555) SpO2:  [88 %-100 %] 93 % (01/14 0555)  Intake/Output from previous day:  Intake/Output Summary (Last 24 hours) at 12/31/2024 0854 Last data filed at 12/31/2024 0620 Gross per 24 hour  Intake 2712.14 ml  Output 1400 ml  Net 1312.14 ml     Intake/Output this shift: No intake/output data recorded.  Labs: Recent Labs    12/31/24 0320  HGB 10.5*   Recent Labs    12/31/24 0320  WBC 11.5*  RBC 3.58*  HCT 32.9*  PLT 176   Recent Labs    12/31/24 0320  NA 146*  K 4.0  CL 109  CO2 25  BUN 26*  CREATININE 1.42*  GLUCOSE 112*  CALCIUM  9.0   No results for input(s): LABPT, INR in the last 72 hours.  Exam: General - Patient is Alert and Oriented Extremity - Neurologically intact Sensation intact distally Intact pulses distally Dorsiflexion/Plantar flexion intact Dressing - dressing C/D/I Motor Function - intact, moving foot and toes well on exam.   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 Tortuous cororany arteries.   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 Verlin & Dr. Murriel.    Assessment/Plan: 1 Day Post-Op Procedures (LRB): ARTHROPLASTY, KNEE, TOTAL (Left) Principal Problem:   S/P total knee arthroplasty, left  Estimated body mass index is 37.12 kg/m as calculated from the following:   Height as of this encounter: 5' 6 (1.676 m).   Weight as of this encounter: 104.3 kg. Advance diet Up with therapy D/C IV fluids   Patient's anticipated LOS is less than 2 midnights, meeting these requirements: - Younger than 80 - Lives within 1 hour of care - Has a competent adult at home to recover with post-op recover - NO history of  - Chronic pain requiring opiods  - Diabetes  - Coronary Artery Disease  - Heart failure  - Heart attack  - Stroke  - DVT/VTE  - Cardiac arrhythmia  - Respiratory Failure/COPD  - Renal failure  -  Anemia  - Advanced Liver disease     DVT Prophylaxis - Eliquis  Weight bearing as tolerated.  Hgb stable at 10.5 this AM. Cr. Near baseline at 1.42  Plan is to go Home after hospital stay. Plan for discharge today following 1-2 sessions of PT as long as they are meeting their goals. Patient is scheduled for OPPT. Follow up in the office in 2 weeks.   Rosina Calin, PA-C Orthopedic Surgery [phone] 12/31/2024, 8:54 AM  "

## 2024-12-31 NOTE — Progress Notes (Signed)
 Physical Therapy Treatment Patient Details Name: [name] [name] MRN: [id number] DOB: Dec 06, 1941 Today's Date: 12/31/2024   History of Present Illness 84 yo female presents to therapy s/p L TKA on 12/30/2024 due to failure of conservative measures. Pt PMH includes but is not limited to: R TKA (09/2024), dCHF, LBBB, CAD s/p TAVR, aortic stenosis, PAF, OA, HLD, HTN, and DM II.    PT Comments  Pt is progressing very well, meeting PT goals and is ready for d/c home from PT standpoint. Spouse will be assisting as needed. Plan is for OPPT. RN and NT updated.     If plan is discharge home, recommend the following: A little help with walking and/or transfers;A little help with bathing/dressing/bathroom;Help with stairs or ramp for entrance;Assist for transportation;Assistance with cooking/housework   Can travel by private vehicle        Equipment Recommendations  None recommended by PT    Recommendations for Other Services       Precautions / Restrictions Precautions Precautions: Fall;Knee Precaution Booklet Issued: No Recall of Precautions/Restrictions: Intact Restrictions Weight Bearing Restrictions Per Provider Order: No Other Position/Activity Restrictions: WBAT     Mobility  Bed Mobility Overal bed mobility: Needs Assistance Bed Mobility: Supine to Sit     Supine to sit: Supervision     General bed mobility comments: pt in chair and left in bathroom    Transfers Overall transfer level: Needs assistance Equipment used: Rolling walker (2 wheels) Transfers: Sit to/from Stand Sit to Stand: Contact guard assist           General transfer comment: cues for hand placement and LLE position. incr time, good pt effort demonstrating caryover from prior session    Ambulation/Gait Ambulation/Gait assistance: Contact guard assist Gait Distance (Feet): 50 Feet Assistive device: Rolling walker (2 wheels) Gait Pattern/deviations: Step-to pattern, Knee flexed in stance - left,  Decreased stance time - left, Decreased step length - right, Decreased step length - left Gait velocity: decr     General Gait Details: cues for sequence and RW position. no LOB   Stairs Stairs: Yes Stairs assistance: Min assist, Contact guard assist Stair Management: One rail Right, Step to pattern, Forwards, With cane Number of Stairs: 3 (up 2 and down 3) General stair comments: cues for sequence, position of SPC. grossly CGA for safety. no overt LOB, no knee buckling. pt spouse present   Wheelchair Mobility     Tilt Bed    Modified Rankin (Stroke Patients Only)       Balance Overall balance assessment: Needs assistance Sitting-balance support: Feet supported, No upper extremity supported Sitting balance-Leahy Scale: Good     Standing balance support: Reliant on assistive device for balance, During functional activity, Bilateral upper extremity supported Standing balance-Leahy Scale: Poor                              Communication Communication Communication: No apparent difficulties  Cognition Arousal: Alert Behavior During Therapy: WFL for tasks assessed/performed   PT - Cognitive impairments: No apparent impairments                         Following commands: Intact      Cueing Cueing Techniques: Verbal cues  Exercises Total Joint Exercises Ankle Circles/Pumps: AROM, Both, 10 reps Quad Sets: 10 reps, Both, AROM Heel Slides: AAROM, Left, 10 reps Straight Leg Raises: AROM, Left, 10 reps    General  Comments        Pertinent Vitals/Pain Pain Assessment Pain Assessment: Faces Faces Pain Scale: Hurts little more Pain Location: L knee Pain Descriptors / Indicators: Sore Pain Intervention(s): Limited activity within patient's tolerance, Monitored during session    Home Living     Available Help at Discharge: Family Type of Home: House   Entrance Stairs-Rails: Right Entrance Stairs-Number of Steps: 3 + 3 (rail on one set and  a gait to pull on the other set)   Home Layout: One level Home Equipment: [employer]ant (2 wheels)      Prior Function            PT Goals (current goals can now be found in the care plan section) Acute Rehab PT Goals PT Goal Formulation: With patient Time For Goal Achievement: 01/07/25 Potential to Achieve Goals: Good Progress towards PT goals: Progressing toward goals    Frequency    7X/week      PT Plan      Co-evaluation              AM-PAC PT 6 Clicks Mobility   Outcome Measure  Help needed turning from your back to your side while in a flat bed without using bedrails?: A Little Help needed moving from lying on your back to sitting on the side of a flat bed without using bedrails?: A Little Help needed moving to and from a bed to a chair (including a wheelchair)?: A Little Help needed standing up from a chair using your arms (e.g., wheelchair or bedside chair)?: A Little Help needed to walk in hospital room?: A Little Help needed climbing 3-5 steps with a railing? : A Little 6 Click Score: 18    End of Session Equipment Utilized During Treatment: Gait belt Activity Tolerance: Patient tolerated treatment well Patient left: Other (comment);with call bell/phone within reach;with family/visitor present (bathroom-NT aware) Nurse Communication: Mobility status PT Visit Diagnosis: Other abnormalities of gait and mobility (R26.89)     Time: 8680-8668 PT Time Calculation (min) (ACUTE ONLY): 12 min  Charges:    $Gait Training: 8-22 mins PT General Charges $$ ACUTE PT VISIT: 1 Visit                     Tavarius Grewe, PT  Acute Rehab Dept ([facility]) [phone]  12/31/2024    [facility] 12/31/2024, 2:11 PM

## 2024-12-31 NOTE — Care Management Obs Status (Signed)
 MEDICARE OBSERVATION STATUS NOTIFICATION   Patient Details  Name: [name] [name] MRN: [id number] Date of Birth: 11-19-1941   Medicare Observation Status Notification Given:  Yes    NORMAN ASPEN, LCSW 12/31/2024, 11:14 AM

## 2024-12-31 NOTE — Progress Notes (Signed)
 Medications delivered to patient in room.  Pt states they do not pay over the phone and will send a check.

## 2024-12-31 NOTE — Evaluation (Signed)
 Physical Therapy Evaluation Patient Details Name: [name] [name] MRN: [id number] DOB: 1941/11/21 Today's Date: 12/31/2024  History of Present Illness  84 yo female presents to therapy s/p L TKA on 12/30/2024 due to failure of conservative measures. Pt PMH includes but is not limited to: R TKA (09/2024), dCHF, LBBB, CAD s/p TAVR, aortic stenosis, PAF, OA, HLD, HTN, and DM II.  Clinical Impression  Pt is s/p TKA resulting in the deficits listed below (see PT Problem List).  Pt amb ~ 59' with RW and min/CGA, limited by fatigue. Pt with intermittent L knee buckling during ambulation. Will see again in pm for stair training and pt will likely be ready to d/c later today  Pt will benefit from acute skilled PT to increase their independence and safety with mobility to allow discharge.          If plan is discharge home, recommend the following: A little help with walking and/or transfers;A little help with bathing/dressing/bathroom;Help with stairs or ramp for entrance;Assist for transportation;Assistance with cooking/housework   Can travel by private vehicle        Equipment Recommendations None recommended by PT  Recommendations for Other Services       Functional Status Assessment Patient has had a recent decline in their functional status and demonstrates the ability to make significant improvements in function in a reasonable and predictable amount of time.     Precautions / Restrictions Precautions Precautions: Fall;Knee Restrictions Weight Bearing Restrictions Per Provider Order: No Other Position/Activity Restrictions: WBAT      Mobility  Bed Mobility Overal bed mobility: Needs Assistance Bed Mobility: Supine to Sit     Supine to sit: Supervision          Transfers Overall transfer level: Needs assistance Equipment used: Rolling walker (2 wheels) Transfers: Sit to/from Stand Sit to Stand: Contact guard assist, Min assist           General transfer comment: cues  for hand placement and LLE position    Ambulation/Gait Ambulation/Gait assistance: Contact guard assist, Min assist Gait Distance (Feet): 80 Feet Assistive device: Rolling walker (2 wheels) Gait Pattern/deviations: Step-to pattern, Decreased step length - left, Decreased stance time - right, Knee flexed in stance - left, Decreased stance time - left       General Gait Details: cues for sequence, step to pattern and use of UEs to offload LLE d/t intermittent  L knee buckling  Stairs            Wheelchair Mobility     Tilt Bed    Modified Rankin (Stroke Patients Only)       Balance Overall balance assessment: Needs assistance Sitting-balance support: Feet supported, No upper extremity supported Sitting balance-Leahy Scale: Good     Standing balance support: Reliant on assistive device for balance, During functional activity, Bilateral upper extremity supported Standing balance-Leahy Scale: Poor                               Pertinent Vitals/Pain Pain Assessment Pain Assessment: Faces Faces Pain Scale: Hurts little more Pain Location: L knee Pain Descriptors / Indicators: Sore Pain Intervention(s): Limited activity within patient's tolerance, Monitored during session, Premedicated before session    Home Living Family/patient expects to be discharged to:: Private residence Living Arrangements: Spouse/significant other Available Help at Discharge: Family Type of Home: House Home Access: Stairs to enter Entrance Stairs-Rails: Right Entrance Stairs-Number of Steps: 3 + 3 (  rail on one set and a gait to pull on the other set)   Home Layout: One level Home Equipment: [employer]ant (2 wheels)      Prior Function Prior Level of Function : Needs assist             Mobility Comments: independent/mod I       Extremity/Trunk Assessment   Upper Extremity Assessment Upper Extremity Assessment: Overall WFL for tasks assessed    Lower Extremity  Assessment Lower Extremity Assessment: LLE deficits/detail LLE Deficits / Details: ankle WFL, knee extension and hip flexion 2+/5, anticipated post op deficits       Communication   Communication Communication: No apparent difficulties    Cognition Arousal: Alert Behavior During Therapy: WFL for tasks assessed/performed   PT - Cognitive impairments: No apparent impairments                         Following commands: Intact       Cueing Cueing Techniques: Verbal cues     General Comments      Exercises Total Joint Exercises Ankle Circles/Pumps: AROM, Both, 10 reps Quad Sets: 10 reps, Both, AROM Heel Slides: AAROM, Left, 10 reps Straight Leg Raises: AROM, Left, 10 reps   Assessment/Plan    PT Assessment Patient needs continued PT services  PT Problem List Decreased strength;Decreased activity tolerance;Decreased balance;Decreased mobility;Pain;Decreased range of motion       PT Treatment Interventions DME instruction;Therapeutic exercise;Gait training;Functional mobility training;Therapeutic activities;Patient/family education;Stair training    PT Goals (Current goals can be found in the Care Plan section)  Acute Rehab PT Goals PT Goal Formulation: With patient Time For Goal Achievement: 01/07/25 Potential to Achieve Goals: Good    Frequency 7X/week     Co-evaluation               AM-PAC PT 6 Clicks Mobility  Outcome Measure Help needed turning from your back to your side while in a flat bed without using bedrails?: A Little Help needed moving from lying on your back to sitting on the side of a flat bed without using bedrails?: A Little Help needed moving to and from a bed to a chair (including a wheelchair)?: A Little Help needed standing up from a chair using your arms (e.g., wheelchair or bedside chair)?: A Little Help needed to walk in hospital room?: A Little Help needed climbing 3-5 steps with a railing? : A Lot 6 Click Score: 17     End of Session Equipment Utilized During Treatment: Gait belt Activity Tolerance: Patient tolerated treatment well Patient left: in chair;with call bell/phone within reach;with chair alarm set;with family/visitor present Nurse Communication: Mobility status PT Visit Diagnosis: Other abnormalities of gait and mobility (R26.89)    Time: 8986-8966 PT Time Calculation (min) (ACUTE ONLY): 20 min   Charges:   PT Evaluation $PT Eval Low Complexity: 1 Low   PT General Charges $$ ACUTE PT VISIT: 1 Visit         Annaleah Arata, PT  Acute Rehab Dept [facility]) [phone]  12/31/2024   [facility] 12/31/2024, 10:40 AM

## 2025-01-01 NOTE — Discharge Summary (Signed)
 Patient ID: [name] [name] MRN: [id number] DOB/AGE: 08/30/1941 84 y.o.  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 Tortuous cororany arteries.   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 Verlin & Dr. Murriel.    Surgeries: Procedures: ARTHROPLASTY, KNEE, TOTAL on 12/30/2024   Consultants:   Discharged Condition: Improved  Hospital Course: [name] [name] is an 84 y.o. female who was admitted 12/30/2024 for operative treatment ofS/P total knee arthroplasty, left. Patient has severe unremitting pain that affects sleep, daily activities, and work/hobbies. After pre-op clearance the patient was taken to the operating room on 12/30/2024 and underwent  Procedures: ARTHROPLASTY, KNEE, TOTAL.    Patient was given perioperative antibiotics:  Anti-infectives (From admission, onward)    Start     Dose/Rate Route Frequency Ordered Stop   12/30/24 1615  ceFAZolin  (ANCEF ) IVPB 2g/100 mL premix        2 g 200 mL/hr over 30 Minutes Intravenous Every 6 hours 12/30/24 1524 12/31/24 1100   12/30/24 0730  ceFAZolin  (ANCEF ) IVPB 2g/100 mL premix         2 g 200 mL/hr over 30 Minutes Intravenous On call to O.R. 12/30/24 0716 12/30/24 1004        Patient was given sequential compression devices, early ambulation, and chemoprophylaxis to prevent DVT. Patient worked with PT and was meeting their goals regarding safe ambulation and transfers.  Patient benefited maximally from hospital stay and there were no complications.    Recent vital signs: Patient Vitals for the past 24 hrs:  BP Temp Temp src Pulse Resp SpO2  12/31/24 1256 130/63 97.6 F (36.4 C) -- (!) 51 18 97 %  12/31/24 0940 (!) 139/53 97.8 F (36.6 C) Oral (!) 52 15 97 %     Recent laboratory studies:  Recent Labs    12/31/24 0320  WBC 11.5*  HGB 10.5*  HCT 32.9*  PLT 176  NA 146*  K 4.0  CL 109  CO2 25  BUN 26*  CREATININE 1.42*  GLUCOSE 112*  CALCIUM  9.0     Discharge Medications:   Allergies as of 12/31/2024       Reactions   Tetanus-diphtheria Toxoids Td Other (See Comments)   Felt very sick   Cardizem  [diltiazem ] Rash        Medication List     TAKE these medications    acetaminophen  500 MG tablet Commonly known as: TYLENOL  Take 2  tablets (1,000 mg total) by mouth every 6 (six) hours.   amiodarone  200 MG tablet Commonly known as: PACERONE  Take 1 tablet (200 mg total) by mouth daily.   amoxicillin  500 MG capsule Commonly known as: AMOXIL  Take 4 capsules (2,000 mg total) by mouth as directed. Take 4 tablets 1 hour prior to dental work, including cleanings.   Blue-Emu Super Strength Crea Apply 1 application  topically daily as needed (arthritis).   Calcium  600+D 600-800 MG-UNIT Tabs Generic drug: Calcium  Carb-Cholecalciferol Take 1 tablet by mouth daily.   carboxymethylcellulose 0.5 % Soln Commonly known as: REFRESH PLUS Place 1 drop into both eyes 3 (three) times daily as needed (dry eyes).   CHLORASEPTIC MT Use as directed 1 spray in the mouth or throat daily as needed (throat pain).   chlorhexidine  4 % external  liquid Commonly known as: HIBICLENS  Apply 15 mLs (1 Application total) topically as directed for 30 doses. Use as directed daily for 5 days every other week for 6 weeks.   CORTIZONE-10/ALOE EX Apply 1 application topically 2 (two) times daily as needed (itching).   Eliquis  5 MG Tabs tablet Generic drug: apixaban  TAKE 1 TABLET BY MOUTH TWICE A DAY   methocarbamol  500 MG tablet Commonly known as: ROBAXIN  Take 1 tablet (500 mg total) by mouth every 6 (six) hours as needed for muscle spasms. What changed: when to take this   metoprolol  succinate 25 MG 24 hr tablet Commonly known as: TOPROL -XL Take 1 tablet (25 mg total) by mouth daily.   oxyCODONE  5 MG immediate release tablet Commonly known as: Oxy IR/ROXICODONE  Take 1 tablet (5 mg total) by mouth every 4 (four) hours as needed for severe pain (pain score 7-10).   polyethylene glycol 17 g packet Commonly known as: MIRALAX  / GLYCOLAX  Take 17 g by mouth 2 (two) times daily.   Potassium Chloride  ER 20 MEQ Tbcr Take 2 tablets (40 mEq total) by mouth daily.   PRESERVISION AREDS 2 PO Take 1 capsule by mouth 2 (two) times daily.   ONE A DAY WOMEN 50 PLUS PO Take 1 tablet by mouth daily.   torsemide  20 MG tablet Commonly known as: DEMADEX  Take 1 tablet (20 mg total) by mouth 2 (two) times daily.               Discharge Care Instructions  (From admission, onward)           Start     Ordered   12/31/24 0000  Change dressing       Comments: Maintain surgical dressing until follow up in the clinic. If the edges start to pull up, may reinforce with tape. If the dressing is no longer working, may remove and cover with gauze and tape, but must keep the area dry and clean.  Call with any questions or concerns.   12/31/24 0855            Diagnostic Studies: No results found.  Disposition: Discharge disposition: 01-Home or Self Care       Discharge Instructions     Call MD / Call 911   Complete by: As  directed    If you experience chest pain or shortness of breath, CALL 911 and be transported to the hospital emergency room.  If you develope a fever above 101 F, pus (white drainage) or increased drainage or redness at the wound, or calf pain, call your surgeon's office.   Change dressing   Complete by: As directed  Maintain surgical dressing until follow up in the clinic. If the edges start to pull up, may reinforce with tape. If the dressing is no longer working, may remove and cover with gauze and tape, but must keep the area dry and clean.  Call with any questions or concerns.   Constipation Prevention   Complete by: As directed    Drink plenty of fluids.  Prune juice may be helpful.  You may use a stool softener, such as Colace (over the counter) 100 mg twice a day.  Use MiraLax  (over the counter) for constipation as needed.   Diet - low sodium heart healthy   Complete by: As directed    Increase activity slowly as tolerated   Complete by: As directed    Weight bearing as tolerated with assist device (walker, cane, etc) as directed, use it as long as suggested by your surgeon or therapist, typically at least 4-6 weeks.   Post-operative opioid taper instructions:   Complete by: As directed    POST-OPERATIVE OPIOID TAPER INSTRUCTIONS: It is important to wean off of your opioid medication as soon as possible. If you do not need pain medication after your surgery it is ok to stop day one. Opioids include: Codeine, Hydrocodone (Norco, Vicodin), Oxycodone (Percocet, oxycontin ) and hydromorphone  amongst others.  Long term and even short term use of opiods can cause: Increased pain response Dependence Constipation Depression Respiratory depression And more.  Withdrawal symptoms can include Flu like symptoms Nausea, vomiting And more Techniques to manage these symptoms Hydrate well Eat regular healthy meals Stay active Use relaxation techniques(deep breathing, meditating, yoga) Do  Not substitute Alcohol to help with tapering If you have been on opioids for less than two weeks and do not have pain than it is ok to stop all together.  Plan to wean off of opioids This plan should start within one week post op of your joint replacement. Maintain the same interval or time between taking each dose and first decrease the dose.  Cut the total daily intake of opioids by one tablet each day Next start to increase the time between doses. The last dose that should be eliminated is the evening dose.      TED hose   Complete by: As directed    Use stockings (TED hose) for 2 weeks on both leg(s).  You may remove them at night for sleeping.        Follow-up Information     [facility] Go on 01/02/2025.   Why: You are scheduled for physical therapy Friday 01/02/25 at 3:30pm; [address] Contact information: [address] [place] [address] [phone]         Patti Rosina SAUNDERS, PA-C. Go on 01/14/2025.   Specialty: Orthopedic Surgery Why: You are scheduled for a post op appointment on Wednesday 01/14/25 at 2:00pm Contact information: [address] [place] [address] [phone]                  Signed: Rosina SAUNDERS Patti 01/01/2025, 7:14 AM
